# Patient Record
Sex: Male | Born: 1940
Health system: Southern US, Community
[De-identification: ages and names within clinical notes are randomized; demographics above are authoritative.]

## PROBLEM LIST (undated history)

## (undated) ENCOUNTER — Emergency Department (HOSPITAL_COMMUNITY): Discharge status: Absent without leave | Payer: PPO

## (undated) DIAGNOSIS — R911 Solitary pulmonary nodule: Secondary | ICD-10-CM

## (undated) DIAGNOSIS — R3911 Hesitancy of micturition: Secondary | ICD-10-CM

## (undated) DIAGNOSIS — I10 Essential (primary) hypertension: Secondary | ICD-10-CM

## (undated) DIAGNOSIS — E291 Testicular hypofunction: Secondary | ICD-10-CM

## (undated) DIAGNOSIS — N200 Calculus of kidney: Secondary | ICD-10-CM

## (undated) DIAGNOSIS — Z8719 Personal history of other diseases of the digestive system: Secondary | ICD-10-CM

## (undated) DIAGNOSIS — M545 Low back pain, unspecified: Secondary | ICD-10-CM

## (undated) DIAGNOSIS — G8929 Other chronic pain: Secondary | ICD-10-CM

## (undated) DIAGNOSIS — R519 Headache, unspecified: Secondary | ICD-10-CM

## (undated) DIAGNOSIS — Z8711 Personal history of peptic ulcer disease: Secondary | ICD-10-CM

## (undated) DIAGNOSIS — E559 Vitamin D deficiency, unspecified: Secondary | ICD-10-CM

## (undated) DIAGNOSIS — K760 Fatty (change of) liver, not elsewhere classified: Secondary | ICD-10-CM

## (undated) DIAGNOSIS — R51 Headache: Secondary | ICD-10-CM

## (undated) DIAGNOSIS — K219 Gastro-esophageal reflux disease without esophagitis: Secondary | ICD-10-CM

## (undated) DIAGNOSIS — E119 Type 2 diabetes mellitus without complications: Secondary | ICD-10-CM

## (undated) DIAGNOSIS — M1990 Unspecified osteoarthritis, unspecified site: Secondary | ICD-10-CM

## (undated) DIAGNOSIS — G4733 Obstructive sleep apnea (adult) (pediatric): Secondary | ICD-10-CM

## (undated) DIAGNOSIS — C44621 Squamous cell carcinoma of skin of unspecified upper limb, including shoulder: Secondary | ICD-10-CM

## (undated) DIAGNOSIS — E785 Hyperlipidemia, unspecified: Secondary | ICD-10-CM

## (undated) DIAGNOSIS — Z9289 Personal history of other medical treatment: Secondary | ICD-10-CM

## (undated) DIAGNOSIS — Z9989 Dependence on other enabling machines and devices: Secondary | ICD-10-CM

## (undated) DIAGNOSIS — F32A Depression, unspecified: Secondary | ICD-10-CM

## (undated) DIAGNOSIS — F419 Anxiety disorder, unspecified: Secondary | ICD-10-CM

## (undated) DIAGNOSIS — F329 Major depressive disorder, single episode, unspecified: Secondary | ICD-10-CM

## (undated) HISTORY — DX: Solitary pulmonary nodule: R91.1

## (undated) HISTORY — DX: Major depressive disorder, single episode, unspecified: F32.9

## (undated) HISTORY — PX: CARDIAC CATHETERIZATION: SHX172

## (undated) HISTORY — DX: Unspecified osteoarthritis, unspecified site: M19.90

## (undated) HISTORY — DX: Personal history of peptic ulcer disease: Z87.11

## (undated) HISTORY — DX: Depression, unspecified: F32.A

## (undated) HISTORY — DX: Hyperlipidemia, unspecified: E78.5

## (undated) HISTORY — DX: Essential (primary) hypertension: I10

## (undated) HISTORY — DX: Personal history of other diseases of the digestive system: Z87.19

## (undated) HISTORY — DX: Testicular hypofunction: E29.1

## (undated) HISTORY — DX: Vitamin D deficiency, unspecified: E55.9

---

## 2009-08-05 HISTORY — PX: CATARACT EXTRACTION W/ INTRAOCULAR LENS IMPLANT: SHX1309

## 2009-08-06 ENCOUNTER — Ambulatory Visit: Payer: Self-pay | Admitting: Family

## 2009-08-06 DIAGNOSIS — I10 Essential (primary) hypertension: Secondary | ICD-10-CM

## 2009-08-06 DIAGNOSIS — G4733 Obstructive sleep apnea (adult) (pediatric): Secondary | ICD-10-CM | POA: Insufficient documentation

## 2009-08-06 DIAGNOSIS — Z8711 Personal history of peptic ulcer disease: Secondary | ICD-10-CM

## 2009-08-06 DIAGNOSIS — E669 Obesity, unspecified: Secondary | ICD-10-CM

## 2009-08-06 DIAGNOSIS — F341 Dysthymic disorder: Secondary | ICD-10-CM

## 2009-08-06 DIAGNOSIS — M1710 Unilateral primary osteoarthritis, unspecified knee: Secondary | ICD-10-CM

## 2009-08-06 DIAGNOSIS — Z860100 Personal history of colon polyps, unspecified: Secondary | ICD-10-CM | POA: Insufficient documentation

## 2009-08-06 DIAGNOSIS — E119 Type 2 diabetes mellitus without complications: Secondary | ICD-10-CM

## 2009-08-06 DIAGNOSIS — E785 Hyperlipidemia, unspecified: Secondary | ICD-10-CM | POA: Insufficient documentation

## 2009-08-06 DIAGNOSIS — IMO0002 Reserved for concepts with insufficient information to code with codable children: Secondary | ICD-10-CM | POA: Insufficient documentation

## 2009-08-06 LAB — CONVERTED CEMR LAB
BUN: 23 mg/dL (ref 6–23)
CO2: 24 meq/L (ref 19–32)
Calcium: 9.6 mg/dL (ref 8.4–10.5)
Chloride: 104 meq/L (ref 96–112)
Creatinine, Ser: 0.87 mg/dL (ref 0.40–1.50)
Glucose, Bld: 77 mg/dL (ref 70–99)
Potassium: 4.4 meq/L (ref 3.5–5.3)
Sodium: 142 meq/L (ref 135–145)

## 2009-08-06 LAB — HM COLONOSCOPY

## 2009-08-09 ENCOUNTER — Telehealth: Payer: Self-pay | Admitting: Family

## 2009-08-13 ENCOUNTER — Encounter: Payer: Self-pay | Admitting: Family

## 2009-08-15 ENCOUNTER — Encounter: Payer: Self-pay | Admitting: Family

## 2009-08-16 ENCOUNTER — Encounter: Payer: Self-pay | Admitting: Family

## 2009-08-16 DIAGNOSIS — K760 Fatty (change of) liver, not elsewhere classified: Secondary | ICD-10-CM

## 2009-08-16 DIAGNOSIS — J449 Chronic obstructive pulmonary disease, unspecified: Secondary | ICD-10-CM

## 2009-08-16 DIAGNOSIS — M545 Low back pain: Secondary | ICD-10-CM

## 2009-08-16 DIAGNOSIS — K219 Gastro-esophageal reflux disease without esophagitis: Secondary | ICD-10-CM

## 2009-08-16 DIAGNOSIS — R809 Proteinuria, unspecified: Secondary | ICD-10-CM

## 2009-09-06 ENCOUNTER — Ambulatory Visit: Payer: Self-pay | Admitting: Family

## 2009-09-06 LAB — CONVERTED CEMR LAB
ALT: 22 units/L (ref 0–53)
Alkaline Phosphatase: 79 units/L (ref 39–117)
Bilirubin, Direct: 0.2 mg/dL (ref 0.0–0.3)
Calcium: 10.4 mg/dL (ref 8.4–10.5)
Cholesterol: 145 mg/dL (ref 0–200)
Creatinine, Ser: 1.14 mg/dL (ref 0.40–1.50)
Creatinine, Urine: 89.4 mg/dL
HDL: 33 mg/dL — ABNORMAL LOW (ref >39)
LDL Cholesterol: 74 mg/dL (ref 0–99)
Potassium: 4.4 meq/L (ref 3.5–5.3)
Total Bilirubin: 0.8 mg/dL (ref 0.3–1.2)
Total Protein: 7.5 g/dL (ref 6.0–8.3)

## 2009-09-12 ENCOUNTER — Encounter: Payer: Self-pay | Admitting: Family

## 2009-09-17 ENCOUNTER — Telehealth: Payer: Self-pay | Admitting: Family

## 2009-09-19 ENCOUNTER — Encounter: Payer: Self-pay | Admitting: Family

## 2009-09-23 ENCOUNTER — Encounter: Payer: Self-pay | Admitting: Family

## 2009-09-25 ENCOUNTER — Encounter: Payer: Self-pay | Admitting: Family

## 2009-10-14 ENCOUNTER — Telehealth: Payer: Self-pay | Admitting: Family

## 2009-10-17 ENCOUNTER — Ambulatory Visit: Payer: Self-pay | Admitting: Family

## 2009-11-18 ENCOUNTER — Telehealth: Payer: Self-pay | Admitting: Family

## 2009-12-09 ENCOUNTER — Ambulatory Visit: Payer: Self-pay | Admitting: Family

## 2009-12-09 LAB — CONVERTED CEMR LAB
BUN: 22 mg/dL (ref 6–23)
Calcium: 9.6 mg/dL (ref 8.4–10.5)
Chloride: 102 meq/L (ref 96–112)
Hgb A1c MFr Bld: 5.9 % — ABNORMAL HIGH (ref <5.7)
Sodium: 139 meq/L (ref 135–145)

## 2009-12-10 ENCOUNTER — Telehealth: Payer: Self-pay | Admitting: Family

## 2009-12-23 ENCOUNTER — Ambulatory Visit (HOSPITAL_BASED_OUTPATIENT_CLINIC_OR_DEPARTMENT_OTHER)
Admission: RE | Admit: 2009-12-23 | Discharge: 2009-12-23 | Payer: Self-pay | Source: Home / Self Care | Attending: Internal Medicine | Admitting: Internal Medicine

## 2009-12-23 ENCOUNTER — Ambulatory Visit: Payer: Self-pay | Admitting: Family

## 2009-12-31 ENCOUNTER — Ambulatory Visit: Payer: Self-pay | Admitting: Family

## 2009-12-31 DIAGNOSIS — N529 Male erectile dysfunction, unspecified: Secondary | ICD-10-CM | POA: Insufficient documentation

## 2010-01-13 ENCOUNTER — Telehealth: Payer: Self-pay | Admitting: Family

## 2010-01-20 ENCOUNTER — Telehealth: Payer: Self-pay | Admitting: Family

## 2010-01-28 ENCOUNTER — Encounter: Payer: Self-pay | Admitting: Family

## 2010-02-03 ENCOUNTER — Telehealth: Payer: Self-pay | Admitting: Family

## 2010-02-04 NOTE — Letter (Signed)
   [place] at [facility] [address] [place], [place]  [address]  [place] Phone: [phone]      September 12, 2009   [facility] [address] [place], [place] [address]  RE:  LAB RESULTS  Dear  Mr. Horen,  The following is an interpretation of your most recent lab tests.  Please take note of any instructions provided or changes to medications that have resulted from your lab work.  ELECTROLYTES:  Good - no changes needed  KIDNEY FUNCTION TESTS:  Stable - no changes needed  LIVER FUNCTION TESTS:  Good - no changes needed  LIPID PANEL:  Fair - review at your next visit Triglyceride: 189   Cholesterol: 145   LDL: 74   HDL: 33   Chol/HDL%:  4.4 Ratio  DIABETIC STUDIES:  Excellent - no changes needed Blood Glucose: 80   HgbA1C: 5.9   Microalbumin/Creatinine Ratio: 28.6    Your triglycerides are high.  Please make the following nutritional changes: 1.  Avoid white bread, white pasta and white rice 2.  Avoid high fructose corn syrup 3.  Instead eat brown carbs- Yams, Wheat pasta, whole grained breads, wild rice.  Return in 3 months for a follow up fasting lipid profile    Sincerely Yours,    Lemont Fillers FNP  Appended Document:  Mailed.

## 2010-02-04 NOTE — Assessment & Plan Note (Signed)
Summary: flu shot/dt  Nurse Visit   Allergies: No Known Drug Allergies  Immunizations Administered:  Influenza Vaccine # 1:    Vaccine Type: Fluvax MCR    Site: left deltoid    Mfr: GlaxoSmithKline    Dose: 0.5 ml    Route: IM    Given by: Glendell Docker CMA    Exp. Date: 07/05/2010    Lot #: [id number]    VIS given: 07/30/09 version given October 17, 2009.  Flu Vaccine Consent Questions:    Do you have a history of severe allergic reactions to this vaccine? no    Any prior history of allergic reactions to egg and/or gelatin? no    Do you have a sensitivity to the preservative Thimersol? no    Do you have a past history of Guillan-Barre Syndrome? no    Do you currently have an acute febrile illness? no    Have you ever had a severe reaction to latex? no    Vaccine information given and explained to patient? yes  Orders Added: 1)  Influenza Vaccine MCR [00025] 2)  Administration Flu vaccine - MCR [G0008]

## 2010-02-04 NOTE — Letter (Signed)
Summary: Records Dated 08-16-07 thru 07-10-09/[facility], [place]  Records Dated 08-16-07 thru 07-10-09/[facility]   Imported By: Lanelle Bal 08/26/2009 14:28:20  _____________________________________________________________________  External Attachment:    Type:   Image     Comment:   External Document

## 2010-02-04 NOTE — Miscellaneous (Signed)
Summary: Controlled Substances Contract/[facility]  Controlled Substances Contract/[facility]   Imported By: Sherian Rein 09/18/2009 10:16:35  _____________________________________________________________________  External Attachment:    Type:   Image     Comment:   External Document

## 2010-02-04 NOTE — Assessment & Plan Note (Signed)
Summary: NEW PT EST CARE/DT--Room 5   Vital Signs:  Patient profile:   70 year old male Height:      70 inches Weight:      307.50 pounds BMI:     44.28 Temp:     97.9 degrees F oral Pulse rate:   60 / minute Pulse rhythm:   irregular Resp:     18 per minute BP sitting:   150 / 90  (right arm) Cuff size:   large  Vitals Entered By: Mervin Kung CMA Duncan Dull) (August 06, 2009 10:49 AM) CC: Room 5  New pt to establish primary care.  Is Patient Diabetic? Yes   CC:  Room 5  New pt to establish primary care. [name]  History of Present Illness: Christian Morris is a 70 year old male who recently returned here from [facility].  He is originally from [place], [place]- family is here.  He reports that he would like to become established here as a patient.  He reports that he had a complete physical on 07/10/09.  The patient has multiple medical problems.  1) Arthritis-  notes that he often has knee pain- takes aleve "every now and then."  2) Depression-  Notes that he has a history of depression which is well controlled on paxil.  3) DM2- Reports that he was diagnosed about 7 years ago.  Does not check sugars at home.  Does not have a glucose meter  4) Ulcers- Reports that he was diagnosed at age 46- has been "under control" since  5) HTN- Not always compliant with low sodium diet.  6) Hyperlipidemia- Reports a low cholesterol diet.  7) Colon polyps-  reports thta this was diagnosed on colonoscopy (had colo this year- reports that he is due in 2014.  8) Obesity- reports that his weight has been stable.    9) OSA- uses a CPAP x 10 years-  reports that he needs to establish with a local home care agency.  Reports that his current setting is 17.  Uses a full face mask and humidification.    Preventive Screening-Counseling & Management  Alcohol-Tobacco     Alcohol drinks/day: 0     Smoking Status: never  Caffeine-Diet-Exercise     Caffeine use/day: 2 cups coffee daily     Does Patient  Exercise: yes     Type of exercise: walking     Exercise (avg: min/session): >60     Times/week: 6  Allergies (verified): No Known Drug Allergies  Past History:  Past Medical History: Arthritis Depression Diabetes History of Stomach Ulcer HTN Hypercholesterolemia Blood Transfusion due to bleeding ulcer  Past Surgical History: none  Family History: Mother-- Arthritis, deceased ? 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 Quit smoking in 1984 Alcohol use-no Regular exercise-yes Smoking Status:  never Caffeine use/day:  2 cups coffee daily Does Patient Exercise:  yes  Review of Systems       Constitutional: Denies Fever ENT:  Denies nasal congestion or sore throat. Resp: Denies cough CV:  Denies Chest Pain GI:  Denies nausea or vomitting GU: Denies dysuria Lymphatic: Denies lymphadenopathy Musculoskeletal:  Denies muscle/joint pain Skin:  Denies Rashes Psychiatric: Denies depression Neuro: Denies numbness     Physical Exam  General:  Morbidly obese, pleasant white male in NAD Head:  Normocephalic and atraumatic without obvious abnormalities. No apparent alopecia  or balding. Lungs:  Normal respiratory effort, chest expands symmetrically. Lungs are clear to auscultation, no crackles or wheezes. Heart:  Normal rate and regular rhythm. S1 and S2 normal without gallop, murmur, click, rub or other extra sounds. Extremities:  No edema Neurologic:  Mildly hard of hearing-difficulty hearing soft speech Skin:  Intact without suspicious lesions or rashes Psych:  Oriented X3 and normally interactive.     Impression & Recommendations:  Problem # 1:  HYPERTENSION (ICD-401.9) Assessment Deteriorated Will add HCTZ to his regimen of Diltiazem and Losartan.   His updated medication list for this problem includes:     Diltiazem Hcl Er Beads 180 Mg Xr24h-cap (Diltiazem hcl er beads) .[name]... Take 1 tablet by mouth once a day.    Losartan Potassium 100 Mg Tabs (Losartan potassium) .[name]... Take 1 tablet by mouth once a day    Hydrochlorothiazide 25 Mg Tabs (Hydrochlorothiazide) ..... One tablet by mouth daily  Orders: T-Basic Metabolic Panel [phone])  BP today: 150/90  Problem # 2:  HYPERLIPIDEMIA (ICD-272.4) Assessment: Comment Only On lipitor- will try to obtain lab tests from his former provider- reports labs done 1 month ago. His updated medication list for this problem includes:    Lipitor 40 Mg Tabs (Atorvastatin calcium) .[name]... Take 1 tablet by mouth once a day.  Problem # 3:  DIABETES MELLITUS, TYPE II (ICD-250.00) Assessment: Comment Only  Does not have meter at home.  Meter was provided to patient. Pt was instructed today on proper use of meter and glycemic goals.  Obtain old labs- will refer patient to podiatry and opthalmology. His updated medication list for this problem includes:    Metformin Hcl 1000 Mg Tabs (Metformin hcl) .[name]... Take 1 tablet by mouth two times a day    Glimepiride 4 Mg Tabs (Glimepiride) .[name]... Take 1 tablet by mouth once a day    Losartan Potassium 100 Mg Tabs (Losartan potassium) .[name]... Take 1 tablet by mouth once a day    Ecotrin Low Strength 81 Mg Tbec (Aspirin) .[name]... Take 1 tablet by mouth once a day  Orders: Ophthalmology Referral (Ophthalmology) Podiatry Referral (Podiatry)  Problem # 4:  SLEEP APNEA, OBSTRUCTIVE (ICD-327.23) Assessment: Comment Only  Wears CPAP,  request local homecare referral for supplies.  Orders: Misc. Referral (Misc. Ref)  Complete Medication List: 1)  Diazepam 10 Mg Tabs (Diazepam) .... Take 1 tablet by mouth once a day 2)  Metformin Hcl 1000 Mg Tabs (Metformin hcl) .... Take 1 tablet by mouth two times a day 3)  Hydrocodone-acetaminophen 7.5-500 Mg Tabs (Hydrocodone-acetaminophen) .... Take 1 tablet by mouth three times a day 4)   Diltiazem Hcl Er Beads 180 Mg Xr24h-cap (Diltiazem hcl er beads) .... Take 1 tablet by mouth once a day. 5)  Glimepiride 4 Mg Tabs (Glimepiride) .... Take 1 tablet by mouth once a day 6)  Omeprazole 20 Mg Cpdr (Omeprazole) .... Take 1 capsule by mouth once a day 7)  Paroxetine Hcl 30 Mg Tabs (Paroxetine hcl) .... Take 1 tablet by mouth once a day 8)  Lipitor 40 Mg Tabs (Atorvastatin calcium) .... Take 1 tablet by mouth once a day. 9)  Losartan Potassium 100 Mg Tabs (Losartan potassium) .... Take 1 tablet by mouth once a day 10)  Ecotrin Low Strength 81 Mg Tbec (Aspirin) .... Take 1 tablet by mouth once a day 11)  Hydrochlorothiazide 25 Mg Tabs (Hydrochlorothiazide) .... One tablet by mouth daily 12)  Accu-chek Aviva Strp (Glucose blood) .... Check blood sugar once a  day.  call if you sugar is over 250 or under 80. 13)  Lancets Misc (Lancets) .... Use as directed  Patient Instructions: 1)  Work hard on diet, exercise and weight loss.   2)  We will contact you about your referral to the eye doctor, and foot doctor. 3)  We will arrange your home care agency-  4)  Please check your blood sugar at least once a day and record in a log- bring this log with you to your next appointment. 5)  Start HCTZ- new medication for your blood pressure.   6)  Follow up in 1 month. 7)  Welcome to [facility], it was a pleasure to meet you! Prescriptions: LANCETS  MISC (LANCETS) use as directed  #1 box x 2   Entered and Authorized by:   Lemont Fillers FNP   Signed by:   Lemont Fillers FNP on 08/06/2009   Method used:   Electronically to        [facility] [phone]* (retail)       [address]       [place], [place]  [address]       Ph: [phone]       Fax: [phone]   RxID:   [id number] ACCU-CHEK AVIVA  STRP (GLUCOSE BLOOD) check blood sugar once a day.  Call if you sugar is over 250 or under 80.  #1 box x 6   Entered and Authorized by:   Lemont Fillers FNP   Signed by:    Lemont Fillers FNP on 08/06/2009   Method used:   Electronically to        [facility] [phone]* (retail)       [address]       [place], [place]  [address]       Ph: [phone]       Fax: [phone]   RxID:   [id number] HYDROCHLOROTHIAZIDE 25 MG TABS (HYDROCHLOROTHIAZIDE) one tablet by mouth daily  #30 x 2   Entered and Authorized by:   Lemont Fillers FNP   Signed by:   Lemont Fillers FNP on 08/06/2009   Method used:   Electronically to        [facility] [phone]* (retail)       [address]       [place], [place]  [address]       Ph: [phone]       Fax: [phone]   RxID:   [id number]   Current Allergies (reviewed today): No known allergies    Preventive Care Screening  Colonoscopy:    Date:  07/17/2009    Next Due:  07/2012    Results:  Adenomatous Polyp   Last Pneumovax:    Date:  02/07/2007    Results:  historical   Last Tetanus Booster:    Date:  10/06/2006    Results:  Historical      Colonoscopy--? date, due again 2014 per pt. Nicki Guadalajara Fergerson CMA Duncan Dull)  August 06, 2009 11:08 AM  Had Zostavax 01/18/08 Pneumovax 12/1998 and 02/2007    Immunization History:  Zostavax History:    Zostavax:  historical (01/18/2008)

## 2010-02-04 NOTE — Letter (Signed)
Summary: Controlled Substances Contract  [place] [facility]  [address]   [place], [place] [address]   Phone: [phone]  Fax: [phone]    [facility] Controlled Substances Contract         Patient Name: Christian Morris Patient DOB: 15-May-1940        Patient MRN:  [id number]        Physician's Name: _________________________________________   Patients must complete this contract before doctors at the [facility] office will be willing to prescribe controlled substances. I understand that: ___1)  I am responsible for my controlled substance medications.  If my prescription is lost, misplaced or stolen, or if I take more than prescribed, my doctor will not write me a new prescription. ___2)  I will not request or accept controlled substances or controlled substance prescriptions from any other doctor or clinic while I am receiving controlled substance treatment at [facility].  The ONLY exception is if controlled substances are prescribed for the treatment of an acute condition that is NOT the diagnosis for which I am receiving treatment at [facility].   I will call my physician at [facility] if I receive controlled substance or controlled substance prescriptions from anywhere else. ___3)  Controlled substance refills will be made ONLY during regular office hours. ___4)  Refills will not be made if I run out early.  Refills will not be made during work-in or urgent care visits.  Refills will NOT be made for "emergencies", such as on a Friday afternoon or by on call service at night or weekends.  I understand that I am required to call at least 2 business days prior to expiration date for controlled substance and/or needing controlled substance refills.   ___5)  I will not use illicit (illegal) drugs.  ___6)  I agree to take urine or blood drug tests when requested for routine screening. ___7)  I agree to use only ONE pharmacy for  filling ALL my controlled substance prescriptions.             Name and Location of Pharmacy:                                                                                                                  ___8)  I understand that my doctor may review my use of controlled substances using the [facility] Controlled Substance Reporting System. ___9)  If I behave in an abusive way towards [facility] staff, my controlled substance prescriptions may be stopped, and I may be dismissed from this practice. ___10)  I understand that if I break any of the above terms of this contract, my pain prescription and/or treatment may be stopped immediately.  If I get controlled substances from someone else or use illegal drugs, I may be reported to all my doctors, medical facilities and appropriate authorities.  I have been fully informed by [facility] physicians and the staff regarding psychological dependence (  addiction) to controlled substances.  I understand that I should stop my medication ONLY under medical supervision or I may have withdrawal symptoms.  ***I have read this contract and it has been explained to me by [facility] physicians and/or their staff, and I fully understand the consequences of violating any of the terms of this contract.    Patient Signature _________________________________________ Date September 06, 2009   [facility] Staff Signature ____________________________________ Date September 06, 2009

## 2010-02-04 NOTE — Miscellaneous (Signed)
Summary: diabetic eye exam  Clinical Lists Changes  Observations: Added new observation of DMEYEEXAMNXT: 09/2010 (09/25/2009 8:19) Added new observation of DMEYEEXMRES: normal--slight proptosis left eye (09/19/2009 8:21) Added new observation of EYE EXAM BY: [facility] [phone] (09/19/2009 8:21) Added new observation of DIAB EYE EX: normal--slight proptosis left eye (09/19/2009 8:21)        Diabetes Management Exam:    Eye Exam:       Eye Exam done elsewhere          Date: 09/19/2009          Results: normal--slight proptosis left eye          Done by: [facility] [phone]

## 2010-02-04 NOTE — Progress Notes (Signed)
Summary: Diazepam refill  Phone Note Refill Request Call back at Home Phone [phone] Message from:  Patient on October 14, 2009 8:53 AM  Refills Requested: Medication #1:  DIAZEPAM 10 MG TABS Take 1 tablet by mouth once a day   Dosage confirmed as above?Dosage Confirmed   Brand Name Necessary? No   Supply Requested: 1 month  Method Requested: Electronic Next Appointment Scheduled: 12.5.11 Initial call taken by: Lannette Donath,  October 14, 2009 8:54 AM  Follow-up for Phone Call        Rx printed and faxed to [facility] [phone]. Pt notified. Nicki Guadalajara Fergerson CMA Duncan Dull)  October 14, 2009 3:13 PM     Prescriptions: DIAZEPAM 10 MG TABS (DIAZEPAM) Take 1 tablet by mouth once a day  #30 x 0   Entered and Authorized by:   Lemont Fillers FNP   Signed by:   Lemont Fillers FNP on 10/14/2009   Method used:   Print then Give to Patient   RxID:   [id number]

## 2010-02-04 NOTE — Progress Notes (Signed)
Summary: Diazepam refill  Phone Note Refill Request Call back at [phone] Message from:  Patient on November 18, 2009 10:36 AM  Refills Requested: Medication #1:  DIAZEPAM 10 MG TABS Take 1 tablet by mouth once a day   Dosage confirmed as above?Dosage Confirmed   Brand Name Necessary? No   Supply Requested: 1 month   Last Refilled: 10/14/2009  Method Requested: Electronic Next Appointment Scheduled: 12/09/09 Initial call taken by: Lannette Donath,  November 18, 2009 10:36 AM    Prescriptions: DIAZEPAM 10 MG TABS (DIAZEPAM) Take 1 tablet by mouth once a day  #30 x 0   Entered and Authorized by:   Lemont Fillers FNP   Signed by:   Lemont Fillers FNP on 11/18/2009   Method used:   Printed then faxed to ...       [facility] [address] [phone]* (retail)       [address]       [place], [place]  [address]       Ph: [phone]       Fax: [phone]   RxID:   [id number]

## 2010-02-04 NOTE — Assessment & Plan Note (Signed)
Summary: 1 month fu/dt--rm 4   Vital Signs:  Patient profile:   70 year old male Height:      70 inches Weight:      301.75 pounds BMI:     43.45 Temp:     98.0 degrees F oral Pulse rate:   72 / minute Pulse rhythm:   regular Resp:     16 per minute BP sitting:   130 / 78  (right arm) Cuff size:   large  Vitals Entered By: Mervin Kung CMA Duncan Dull) (September 06, 2009 10:33 AM)  CC: Rm 4  1 month follow up. Is Patient Diabetic? Yes Comments Needs refill on Glimepiride. Nicki Guadalajara Fergerson CMA (AAMA)  September 06, 2009 10:38 AM    CC:  Rm 4  1 month follow up.[name]  History of Present Illness: Christian Morris is a 70 year old male who presents today to follow up.  HTN- Added HCTZ last visit.  BP improved, pt has been walking and has lost 6 pounds.    DM2-  has been having trouble with his blood blood glucose meter.  Pt reports that he did see podiatrist but cancelled the eye exam due to finances.  Allergies (verified): No Known Drug Allergies  Past History:  Past Medical History: Last updated: 08/06/2009 Arthritis Depression Diabetes History of Stomach Ulcer HTN Hypercholesterolemia Blood Transfusion due to bleeding ulcer  Review of Systems       see HPI  Physical Exam  General:  Morbidly obese, pleasant white male in NAD Lungs:  Normal respiratory effort, chest expands symmetrically. Lungs are clear to auscultation, no crackles or wheezes. Heart:  Normal rate and regular rhythm. S1 and S2 normal without gallop, murmur, click, rub or other extra sounds.  Diabetes Management Exam:    Foot Exam (with socks and/or shoes not present):       Sensory-Monofilament:          Left foot: diminished          Right foot: diminished       Sensory-other: diminishes sensation to monofilament, however feet are very calloused.       Inspection:          Left foot: abnormal             Comments: dry thickened skin noted soles          Right foot: abnormal             Comments:  dry thickened skin on sole   Impression & Recommendations:  Problem # 1:  DIABETES MELLITUS, TYPE II (ICD-250.00) Assessment Comment Only Reviewed old records- last A1C was 7/10, will repeat today and include a lipid panel since patient is fasting today.  I encouraged him on his weight loss and walking. His updated medication list for this problem includes:    Metformin Hcl 1000 Mg Tabs (Metformin hcl) .[name]... Take 1 tablet by mouth two times a day    Glimepiride 4 Mg Tabs (Glimepiride) .[name]... Take 1 tablet by mouth once a day    Losartan Potassium 100 Mg Tabs (Losartan potassium) .[name]... Take 1 tablet by mouth once a day    Ecotrin Low Strength 81 Mg Tbec (Aspirin) .[name]... Take 1 tablet by mouth once a day  Orders: T-Hgb A1C ([address]-60[address]) UA Microalbumin-[facility] (09811) Prescription Created Electronically [phone])  Problem # 2:  HYPERTENSION (ICD-401.9) Assessment: Improved BP is improved, continue current meds. His updated medication list for this problem includes:  Diltiazem Hcl Er Beads 180 Mg Xr24h-cap (Diltiazem hcl er beads) .[name]... Take 1 tablet by mouth once a day.    Losartan Potassium 100 Mg Tabs (Losartan potassium) .[name]... Take 1 tablet by mouth once a day    Hydrochlorothiazide 25 Mg Tabs (Hydrochlorothiazide) ..... One tablet by mouth daily  Orders: TLB-BMP (Basic Metabolic Panel-BMET) (80048-METABOL)  BP today: 130/78 Prior BP: 150/90 (08/06/2009)  Labs Reviewed: K+: 4.4 (08/06/2009) Creat: : 0.87 (08/06/2009)     Complete Medication List: 1)  Diazepam 10 Mg Tabs (Diazepam) .... Take 1 tablet by mouth once a day 2)  Metformin Hcl 1000 Mg Tabs (Metformin hcl) .... Take 1 tablet by mouth two times a day 3)  Hydrocodone-acetaminophen 7.5-500 Mg Tabs (Hydrocodone-acetaminophen) .... Take 1 tablet by mouth three times a day 4)  Diltiazem Hcl Er Beads 180 Mg Xr24h-cap (Diltiazem hcl er beads) .... Take 1 tablet by mouth once a day. 5)  Glimepiride 4 Mg Tabs (Glimepiride) ....  Take 1 tablet by mouth once a day 6)  Omeprazole 20 Mg Cpdr (Omeprazole) .... Take 1 capsule by mouth once a day 7)  Paroxetine Hcl 30 Mg Tabs (Paroxetine hcl) .... Take 1 tablet by mouth once a day 8)  Lipitor 40 Mg Tabs (Atorvastatin calcium) .... Take 1 tablet by mouth once a day. 9)  Losartan Potassium 100 Mg Tabs (Losartan potassium) .... Take 1 tablet by mouth once a day 10)  Ecotrin Low Strength 81 Mg Tbec (Aspirin) .... Take 1 tablet by mouth once a day 11)  Hydrochlorothiazide 25 Mg Tabs (Hydrochlorothiazide) .... One tablet by mouth daily 12)  Accu-chek Aviva Strp (Glucose blood) .... Check blood sugar once a day.  call if you sugar is over 250 or under 80. 13)  Lancets Misc (Lancets) .... Use as directed  Other Orders: TLB-Lipid Panel (80061-LIPID) TLB-Hepatic/Liver Function Pnl (80076-HEPATIC)  Patient Instructions: 1)  Complete your lab work downstairs today on the first floor. 2)  Please follow up in 3 months. 3)  Reschedule your eye exam when you are able.  4)  Have a nice Fall! Prescriptions: GLIMEPIRIDE 4 MG TABS (GLIMEPIRIDE) Take 1 tablet by mouth once a day  #30 x 0   Entered and Authorized by:   Lemont Fillers FNP   Signed by:   Lemont Fillers FNP on 09/06/2009   Method used:   Print then Give to Patient   RxID:   [id number] HYDROCODONE-ACETAMINOPHEN 7.5-500 MG TABS (HYDROCODONE-ACETAMINOPHEN) Take 1 tablet by mouth three times a day  #90 x 0   Entered and Authorized by:   Lemont Fillers FNP   Signed by:   Lemont Fillers FNP on 09/06/2009   Method used:   Print then Give to Patient   RxID:   [id number] GLIMEPIRIDE 4 MG TABS (GLIMEPIRIDE) Take 1 tablet by mouth once a day  #30 x 0   Entered and Authorized by:   Lemont Fillers FNP   Signed by:   Lemont Fillers FNP on 09/06/2009   Method used:   Electronically to        [facility] [phone]* (retail)       [address]       [place], [place]  [address]        Ph: [phone]       Fax: [phone]   RxID:   [id number]  Called and left message on voicemail to add 3 refills to pt's rx. per [facility] request. Nicki Guadalajara  Fergerson CMA Duncan Dull)  September 06, 2009 3:07 PM  Current Allergies (reviewed today): No known allergies

## 2010-02-04 NOTE — Progress Notes (Signed)
  Phone Note Outgoing Call      

## 2010-02-04 NOTE — Miscellaneous (Signed)
  Clinical Lists Changes  Problems: Added new problem of CATARACT, LEFT EYE (ICD-366.9)

## 2010-02-04 NOTE — Miscellaneous (Signed)
Summary: immunizations  Clinical Lists Changes  Observations: Added new observation of FLU VAX: Historical (10/25/2008 17:03) Added new observation of ZOSTAVAX: Zostavax (07/17/2008 17:04)      Immunization History:  Influenza Immunization History:    Influenza:  historical (10/25/2008)  Zostavax History:    Zostavax # 1:  zostavax (07/17/2008)

## 2010-02-04 NOTE — Consult Note (Signed)
Summary: [facility]  [facility]   Imported By: Lanelle Bal 10/15/2009 09:11:57  _____________________________________________________________________  External Attachment:    Type:   Image     Comment:   External Document

## 2010-02-04 NOTE — Letter (Signed)
Summary: [facility]  [facility]   Imported By: Lanelle Bal 10/04/2009 11:47:49  _____________________________________________________________________  External Attachment:    Type:   Image     Comment:   External Document

## 2010-02-04 NOTE — Assessment & Plan Note (Signed)
Summary: 3 month follow up/mhf--Rm 4   Vital Signs:  Patient profile:   70 year old male Height:      70 inches Weight:      300.50 pounds BMI:     43.27 Temp:     97.9 degrees F o1 Pulse rate:   56 / minute Pulse rhythm:   regular Resp:     18 per minute BP sitting:   130 / 86  (right arm) Cuff size:   large  Vitals Entered By: Mervin Kung CMA Duncan Dull) (December 09, 2009 8:51 AM) CC: Pt here for 3 month f/u.  Is Patient Diabetic? Yes Comments Pt needs refill on Hydrocodone and HCTZ if he is to continue it. Pt agrees all other med doses and directions are correct. Nicki Guadalajara Fergerson CMA Duncan Dull)  December 09, 2009 8:59 AM    Primary Care Provider:  Lemont Fillers FNP  CC:  Pt here for 3 month f/u. [name]  History of Present Illness: Mr. Scoggins is a 70 year old male who presents today for routine follow up.  1) Obesity-  Reports that he is now waling 5 miles a day 5-6 days a walk.     2) DM-  Has not been checking sugars at home.  Denies nocturia, or polydypsia.  3) Depression/Anxiety-  Reports that this is well controlled.  Sleeping well.  Takes diazepam once a day  4) HTN- Denies LE edema, headache or chest pain.  Ran out of the HCTZ  5) Back pain-  still requiring the hydrocodone regulary.  "I can't walk with it."   Allergies (verified): No Known Drug Allergies  Past History:  Past Medical History: Last updated: 08/06/2009 Arthritis Depression Diabetes History of Stomach Ulcer HTN Hypercholesterolemia Blood Transfusion due to bleeding ulcer  Past Surgical History: August 2011 L cataract surgery (Dr. Dione Booze)  Review of Systems       see HPI recently had some pain in the left ear  Physical Exam  General:  Morbidly obese, pleasant white male in NAD Ears:  External ear exam shows no significant lesions or deformities.  Otoscopic examination reveals clear canals, tympanic membranes are intact bilaterally without bulging, retraction, inflammation or  discharge. Hearing is grossly normal bilaterally. Neck:  No deformities, masses, or tenderness noted. Lungs:  Normal respiratory effort, chest expands symmetrically. Lungs are clear to auscultation, no crackles or wheezes. Heart:  Normal rate and regular rhythm. S1 and S2 normal without gallop, murmur, click, rub or other extra sounds. Extremities:  No peripheral edema Psych:  Cognition and judgment appear intact. Alert and cooperative with normal attention span and concentration. No apparent delusions, illusions, hallucinations   Impression & Recommendations:  Problem # 1:  LOW BACK PAIN, CHRONIC (ICD-724.2) Assessment Unchanged Continue hydrocodone and weight loss efforts. His updated medication list for this problem includes:    Hydrocodone-acetaminophen 7.5-500 Mg Tabs (Hydrocodone-acetaminophen) .[name]... Take 1 tablet by mouth three times a day    Ecotrin Low Strength 81 Mg Tbec (Aspirin) .[name]... Take 1 tablet by mouth once a day  Problem # 2:  DIABETES MELLITUS, TYPE II (ICD-250.00) Assessment: Comment Only A1C last visit was 5.9, had eye exam, repeat A1C His updated medication list for this problem includes:    Metformin Hcl 1000 Mg Tabs (Metformin hcl) .[name]... Take 1 tablet by mouth two times a day    Glimepiride 4 Mg Tabs (Glimepiride) .[name]... Take 1 tablet by mouth once a day    Losartan Potassium 100 Mg  Tabs (Losartan potassium) .[name]... Take 1 tablet by mouth once a day    Ecotrin Low Strength 81 Mg Tbec (Aspirin) .[name]... Take 1 tablet by mouth once a day  Orders: TLB-BMP (Basic Metabolic Panel-BMET) (80048-METABOL) T-Hgb A1C (11914-78295)  Labs Reviewed: Creat: 1.14 (09/06/2009)     Last Eye Exam: normal--slight proptosis left eye (09/19/2009) Reviewed HgBA1c results: 5.9 (09/06/2009)  Problem # 3:  HYPERTENSION (ICD-401.9) Assessment: Comment Only Has not taken HCTZ in 2 months, ran out.  DBP not quite at goal.  Instructed patient to resume. His updated medication list for this  problem includes:    Diltiazem Hcl Er Beads 180 Mg Xr24h-cap (Diltiazem hcl er beads) .[name]... Take 1 tablet by mouth once a day.    Losartan Potassium 100 Mg Tabs (Losartan potassium) .[name]... Take 1 tablet by mouth once a day    Hydrochlorothiazide 25 Mg Tabs (Hydrochlorothiazide) ..... One tablet by mouth daily  Orders: TLB-BMP (Basic Metabolic Panel-BMET) (80048-METABOL)  BP today: 130/86 Prior BP: 130/78 (09/06/2009)  Labs Reviewed: K+: 4.4 (09/06/2009) Creat: : 1.14 (09/06/2009)   Chol: 145 (09/06/2009)   HDL: 33 (09/06/2009)   LDL: 74 (09/06/2009)   TG: 189 (09/06/2009)  Problem # 4:  MICROALBUMINURIA (ICD-791.0) Assessment: Comment Only continue ARB for renal protection.  Problem # 5:  ANXIETY DEPRESSION (ICD-300.4) Assessment: Improved Reports that this is well controlled.  Uses valium once daily  Complete Medication List: 1)  Diazepam 10 Mg Tabs (Diazepam) .... Take 1 tablet by mouth once a day 2)  Metformin Hcl 1000 Mg Tabs (Metformin hcl) .... Take 1 tablet by mouth two times a day 3)  Hydrocodone-acetaminophen 7.5-500 Mg Tabs (Hydrocodone-acetaminophen) .... Take 1 tablet by mouth three times a day 4)  Diltiazem Hcl Er Beads 180 Mg Xr24h-cap (Diltiazem hcl er beads) .... Take 1 tablet by mouth once a day. 5)  Glimepiride 4 Mg Tabs (Glimepiride) .... Take 1 tablet by mouth once a day 6)  Omeprazole 20 Mg Cpdr (Omeprazole) .... Take 1 capsule by mouth once a day 7)  Paroxetine Hcl 30 Mg Tabs (Paroxetine hcl) .... Take 1 tablet by mouth once a day 8)  Lipitor 40 Mg Tabs (Atorvastatin calcium) .... Take 1 tablet by mouth once a day. 9)  Losartan Potassium 100 Mg Tabs (Losartan potassium) .... Take 1 tablet by mouth once a day 10)  Ecotrin Low Strength 81 Mg Tbec (Aspirin) .... Take 1 tablet by mouth once a day 11)  Hydrochlorothiazide 25 Mg Tabs (Hydrochlorothiazide) .... One tablet by mouth daily 12)  Accu-chek Aviva Strp (Glucose blood) .... Check blood sugar once a day.  call  if you sugar is over 250 or under 80. 13)  Lancets Misc (Lancets) .... Use as directed  Patient Instructions: 1)  Please complete your lab work downstairs today. 2)  Follow up in 3 months. 3)  Have a nice Christmas! Prescriptions: HYDROCHLOROTHIAZIDE 25 MG TABS (HYDROCHLOROTHIAZIDE) one tablet by mouth daily  #30 x 2   Entered and Authorized by:   Lemont Fillers FNP   Signed by:   Lemont Fillers FNP on 12/09/2009   Method used:   Electronically to        [facility] [phone]* (retail)       [address]       [place], [place]  [address]       Ph: [phone]       Fax: [phone]   RxID:   [id number] HYDROCODONE-ACETAMINOPHEN 7.5-500 MG TABS (HYDROCODONE-ACETAMINOPHEN)  Take 1 tablet by mouth three times a day  #90 x 0   Entered and Authorized by:   Lemont Fillers FNP   Signed by:   Lemont Fillers FNP on 12/09/2009   Method used:   Print then Give to Patient   RxID:   [id number] DIAZEPAM 10 MG TABS (DIAZEPAM) Take 1 tablet by mouth once a day  #30 x 0   Entered and Authorized by:   Lemont Fillers FNP   Signed by:   Lemont Fillers FNP on 12/09/2009   Method used:   Print then Give to Patient   RxID:   [id number]    Orders Added: 1)  TLB-BMP (Basic Metabolic Panel-BMET) [80048-METABOL] 2)  T-Hgb A1C [83036-23375] 3)  Est. Patient Level III [[address]]    Current Allergies (reviewed today): No known allergies

## 2010-02-04 NOTE — Progress Notes (Signed)
Summary: refill--Glimepiride  Phone Note Refill Request Message from:  Patient on August 09, 2009 10:00 AM  Refills Requested: Medication #1:  GLIMEPIRIDE 4 MG TABS Take 1 tablet by mouth once a day   Dosage confirmed as above?Dosage Confirmed   Supply Requested: 1 month Next Appointment Scheduled: 09/06/09  [name], NP Initial call taken by: Mervin Kung CMA (AAMA),  August 09, 2009 10:00 AM    Prescriptions: GLIMEPIRIDE 4 MG TABS (GLIMEPIRIDE) Take 1 tablet by mouth once a day  #30 x 0   Entered by:   Mervin Kung CMA (AAMA)   Authorized by:   Lemont Fillers FNP   Signed by:   Mervin Kung CMA (AAMA) on 08/09/2009   Method used:   Electronically to        [facility] [phone]* (retail)       [address]       [place], [place]  [address]       Ph: [phone]       Fax: [phone]   RxID:   [id number]

## 2010-02-04 NOTE — Progress Notes (Signed)
Summary: lab result  Phone Note Outgoing Call   Summary of Call: Please call patient and let him know that his diabetes is very well controlled.  I would like him to cut back his metformin to 500 mg twice daily (he can cut pills in half).  Follow-up for Phone Call        Pt notified. Nicki Guadalajara Fergerson CMA Duncan Dull)  December 10, 2009 10:42 AM     New/Updated Medications: METFORMIN HCL 1000 MG TABS (METFORMIN HCL) Take 1/2  tablet by mouth two times a day

## 2010-02-04 NOTE — Miscellaneous (Signed)
  Clinical Lists Changes  Problems: Added new problem of CHRONIC OBSTRUCTIVE PULMONARY DISEASE (ICD-496) Added new problem of GERD (ICD-530.81) Added new problem of LOW BACK PAIN, CHRONIC (ICD-724.2) - history of spinal stenosis Added new problem of MICROALBUMINURIA (ICD-791.0) Added new problem of FATTY LIVER DISEASE (ICD-571.8)

## 2010-02-06 NOTE — Progress Notes (Signed)
Summary: Lortab Refill  Phone Note Call from Patient Call back at Home Phone [phone] Call back at [phone]   Caller: Patient Summary of Call: patient called requesting a rx refill for Lortab for a fracture in his back.  He was informed [name] was out of the office this week on vacation, and approval would need to come from Dr Artist Pais. He was informed a call would be returned to him regarding status Initial call taken by: Glendell Docker CMA,  January 13, 2010 8:43 AM  Follow-up for Phone Call        I do not see any prev notes documenting back pain or prev fracture I suggest OV before refill Follow-up by: D. Thomos Lemons DO,  January 13, 2010 1:42 PM  Additional Follow-up for Phone Call Additional follow up Details #1::        Pt states he will try to wait until [name] returns. If he is unable to wait he states he will make an appt with Dr Artist Pais. Nicki Guadalajara Fergerson CMA Duncan Dull)  January 13, 2010 4:52 PM

## 2010-02-06 NOTE — Progress Notes (Signed)
Summary: refills--diazepam, hydrocodone  Phone Note Refill Request Message from:  Patient on January 20, 2010 9:48 AM  Refills Requested: Medication #1:  DIAZEPAM 10 MG TABS Take 1 tablet by mouth once a day   Dosage confirmed as above?Dosage Confirmed   Supply Requested: 1 month   Last Refilled: 12/09/2009  Medication #2:  HYDROCODONE-ACETAMINOPHEN 7.5-500 MG TABS Take 1 tablet by mouth three times a day   Dosage confirmed as above?Dosage Confirmed   Supply Requested: 1 month   Last Refilled: 12/09/2009 Please advise.  Next Appointment Scheduled: 03/10/10 O'Sullivan,NP Initial call taken by: Mervin Kung CMA (AAMA),  January 20, 2010 9:49 AM  Follow-up for Phone Call        OK to refil one month.  Additional Follow-up for Phone Call Additional follow up Details #1::        Rxs refilled x 1 month. Pt notified. Nicki Guadalajara Fergerson CMA (AAMA)  January 20, 2010 11:06 AM     Prescriptions: HYDROCODONE-ACETAMINOPHEN 7.5-500 MG TABS (HYDROCODONE-ACETAMINOPHEN) Take 1 tablet by mouth three times a day  #90 x 0   Entered by:   Mervin Kung CMA (AAMA)   Authorized by:   Lemont Fillers FNP   Signed by:   Lemont Fillers FNP on 01/20/2010   Method used:   Telephoned to ...       [facility] [address] [phone]* (retail)       [address]       [place], [place]  [address]       Ph: [phone]       Fax: [phone]   RxID:   [id number] DIAZEPAM 10 MG TABS (DIAZEPAM) Take 1 tablet by mouth once a day  #30 x 0   Entered by:   Mervin Kung CMA (AAMA)   Authorized by:   Lemont Fillers FNP   Signed by:   Lemont Fillers FNP on 01/20/2010   Method used:   Telephoned to ...       [facility] [address] [phone]* (retail)       [address]       [place], [place]  [address]       Ph: [phone]       Fax: [phone]   RxID:   [id number]

## 2010-02-06 NOTE — Assessment & Plan Note (Signed)
Summary: cough congestion/mhf--Rm 5   Vital Signs:  Patient profile:   70 year old male Height:      70 inches Weight:      294 pounds BMI:     42.34 O2 Sat:      93 % Temp:     97.5 degrees F oral Pulse rate:   66 / minute Pulse rhythm:   regular Resp:     18 per minute BP sitting:   120 / 70  (right arm) Cuff size:   large  Vitals Entered By: Mervin Kung CMA Duncan Dull) (December 23, 2009 2:26 PM) Is Patient Diabetic? Yes Pain Assessment Patient in pain? no      Comments Pt agrees all med doses and directions are correct. Nicki Guadalajara Fergerson CMA Duncan Dull)  December 23, 2009 2:32 PM    Primary Care Provider:  Lemont Fillers FNP   History of Present Illness: Pt is here today wit complaint of cough. Onset of symptoms was 2 weeks ago. Symptom characterized as coarse productive cough- yellow sputum.   problem associated with wheezing  and  night sweats, but not associated with fever Symptoms improved by advil. Symptoms are worst in the AM when he wakes up.   Energy is fair.     Allergies (verified): No Known Drug Allergies  Past History:  Past Medical History: Last updated: 08/06/2009 Arthritis Depression Diabetes History of Stomach Ulcer HTN Hypercholesterolemia Blood Transfusion due to bleeding ulcer  Past Surgical History: Last updated: 12/09/2009 August 2011 L cataract surgery (Dr. Dione Booze)  Review of Systems       The patient complains of weight loss and prolonged cough.  The patient denies anorexia, fever, dyspnea on exertion, and difficulty walking.         see HPI, has lost 15 pounds with diet and exercise.  Denies peripheral edema  Physical Exam  General:  Obese white male, awake, alert and in NAD Head:  Normocephalic and atraumatic without obvious abnormalities. No apparent alopecia or balding. Eyes:  No corneal or conjunctival inflammation noted. EOMI. Perrla. Funduscopic exam benign, without hemorrhages, exudates or papilledema. Vision  grossly normal. Ears:  External ear exam shows no significant lesions or deformities.  Otoscopic examination reveals clear canals, tympanic membranes are intact bilaterally without bulging, retraction, inflammation or discharge. Hearing is grossly normal bilaterally. Mouth:  Oral mucosa and oropharynx without lesions or exudates.  Teeth in good repair. Neck:  No deformities, masses, or tenderness noted. Lungs:  Bilateral expiratory wheeze, no increased work of breathing. Post neb treatement, resolution of wheezing is noted.  Heart:  Normal rate and regular rhythm. S1 and S2 normal without gallop, murmur, click, rub or other extra sounds. Extremities:  No clubbing, cyanosis, edema Neurologic:  No cranial nerve deficits noted. Station and gait are normal. Plantar reflexes are down-going bilaterally. DTRs are symmetrical throughout. Sensory, motor and coordinative functions appear intact. Skin:  Dry appearing skin Psych:  Cognition and judgment appear intact. Alert and cooperative with normal attention span and concentration. No apparent delusions, illusions, hallucinations   Impression & Recommendations:  Problem # 1:  BRONCHITIS, ACUTE WITH MILD BRONCHOSPASM (ICD-466.0) Assessment New Chest x-ray was performed today- no acute changes.  Pt responded well to albuterol neb in office today.  Will plan to treat with [facility] short term as well as Pro-air as needed.  Treat patient with zithromax for bronchitis.  F/u in 1 week. Patient was given sample of Dulera- [id number] exp apr 2012  His updated medication list  for this problem includes:    Dulera 100-5 Mcg/act Aero (Mometasone furo-formoterol fum) .[name]... 2 puffs twice daily    Proair Hfa 108 (90 Base) Mcg/act Aers (Albuterol sulfate) .[name]... 2 puffs every 6 hours as needed for cough/wheezing    Zithromax Z-pak 250 Mg Tabs (Azithromycin) .[name]... 2 tabs by mouth today, then one tablet by mouth daily for 4 additional days  Complete Medication List: 1)   Diazepam 10 Mg Tabs (Diazepam) .... Take 1 tablet by mouth once a day 2)  Metformin Hcl 1000 Mg Tabs (Metformin hcl) .... Take 1/2  tablet by mouth two times a day 3)  Hydrocodone-acetaminophen 7.5-500 Mg Tabs (Hydrocodone-acetaminophen) .... Take 1 tablet by mouth three times a day 4)  Diltiazem Hcl Er Beads 180 Mg Xr24h-cap (Diltiazem hcl er beads) .... Take 1 tablet by mouth once a day. 5)  Glimepiride 4 Mg Tabs (Glimepiride) .... Take 1 tablet by mouth once a day 6)  Omeprazole 20 Mg Cpdr (Omeprazole) .... Take 1 capsule by mouth once a day 7)  Paroxetine Hcl 30 Mg Tabs (Paroxetine hcl) .... Take 1 tablet by mouth once a day 8)  Lipitor 40 Mg Tabs (Atorvastatin calcium) .... Take 1 tablet by mouth once a day. 9)  Losartan Potassium 100 Mg Tabs (Losartan potassium) .... Take 1 tablet by mouth once a day 10)  Ecotrin Low Strength 81 Mg Tbec (Aspirin) .... Take 1 tablet by mouth once a day 11)  Hydrochlorothiazide 25 Mg Tabs (Hydrochlorothiazide) .... One tablet by mouth daily 12)  Accu-chek Aviva Strp (Glucose blood) .... Check blood sugar once a day.  call if you sugar is over 250 or under 80. 13)  Lancets Misc (Lancets) .... Use as directed 14)  Dulera 100-5 Mcg/act Aero (Mometasone furo-formoterol fum) .... 2 puffs twice daily 15)  Proair Hfa 108 (90 Base) Mcg/act Aers (Albuterol sulfate) .... 2 puffs every 6 hours as needed for cough/wheezing 16)  Zithromax Z-pak 250 Mg Tabs (Azithromycin) .... 2 tabs by mouth today, then one tablet by mouth daily for 4 additional days  Other Orders: CXR- 2view (CXR) Albuterol Sulfate Sol 1mg  unit dose (Z6109) Nebulizer Tx (60454)  Patient Instructions: 1)  Call if your symptoms worsen, if you develop fever, or if your symptoms are not improved in 48-72 hours.   2)  Follow up in 1 week.  Prescriptions: ZITHROMAX Z-PAK 250 MG TABS (AZITHROMYCIN) 2 tabs by mouth today, then one tablet by mouth daily for 4 additional days  #1 pack x 0   Entered and  Authorized by:   Lemont Fillers FNP   Signed by:   Lemont Fillers FNP on 12/23/2009   Method used:   Electronically to        [facility] [phone]* (retail)       [address]       [place], [place]  [address]       Ph: [phone]       Fax: [phone]   RxID:   [id number] PROAIR HFA 108 (90 BASE) MCG/ACT AERS (ALBUTEROL SULFATE) 2 puffs every 6 hours as needed for cough/wheezing  #1 x 0   Entered and Authorized by:   Lemont Fillers FNP   Signed by:   Lemont Fillers FNP on 12/23/2009   Method used:   Electronically to        [facility] [phone]* (retail)       [address]  [place], [place]  [address]       Ph: [phone]       Fax: [phone]   RxID:   [id number] DULERA 100-5 MCG/ACT AERO (MOMETASONE FURO-FORMOTEROL FUM) 2 puffs twice daily  #1 x 0   Entered and Authorized by:   Lemont Fillers FNP   Signed by:   Lemont Fillers FNP on 12/23/2009   Method used:   Samples Given   RxID:   [id number]    Medication Administration  Medication # 1:    Medication: Albuterol Sulfate Sol 1mg  unit dose    Diagnosis: CHRONIC OBSTRUCTIVE PULMONARY DISEASE (ICD-496)    Dose: 2.5mg  / 3mL    Route: inhaled    Exp Date: 07/05/2010    Lot #: [id number]    Mfr: [employer]    Patient tolerated medication without complications    Given by: Mervin Kung CMA Duncan Dull) (December 23, 2009 3:25 PM)  Orders Added: 1)  CXR- 2view [CXR] 2)  Albuterol Sulfate Sol 1mg  unit dose [J7613] 3)  Nebulizer Tx [94640] 4)  Est. Patient Level IV [38756]    Current Allergies (reviewed today): No known allergies    Vital Signs:  Patient Profile:   70 year old male Height:     70 inches Weight:      294 pounds BMI:     42.34 O2 Sat:      93 % O2 treatment:    Room Air Temp:     97.5 degrees F oral Pulse rate:   66 / minute Pulse rhythm:   regular Resp:     18 per minute BP sitting:   120 / 70 Cuff size:    large

## 2010-02-06 NOTE — Assessment & Plan Note (Signed)
Summary: 1 WEEK FOLLOW UP/MHF--Rm 5   Vital Signs:  Patient profile:   70 year old male Height:      70 inches Weight:      298.50 pounds BMI:     42.99 Temp:     97.6 degrees F oral Pulse rate:   60 / minute Pulse rhythm:   regular Resp:     18 per minute BP sitting:   122 / 70  (right arm) Cuff size:   large  Vitals Entered By: Mervin Kung CMA Duncan Dull) (December 31, 2009 9:44 AM) CC: Pt here for 1 week follow up. Is Patient Diabetic? Yes Comments Pt has completed Zpack. Christian Morris CMA Duncan Dull)  December 31, 2009 9:49 AM    Primary Care Provider:  Lemont Fillers FNP  CC:  Pt here for 1 week follow up.Christian Morris  History of Present Illness: [name] is a 70 year old male who presents today for follow up of his bronchitis.    1) Bronchitis- Last visit he was treated with zithromax and dulera/albuterol for reactive airway component. Cough is improved.  Completed antibiotic.  He continues to use the inhaler.  Denies fever.   Patient continues to walk 5 miles a day.    2) ED-  reports  that he has history of ED for which he uses Viagra-  has not used in about 1.5 years.  Requesting refills.   Has used 100 mg tabs in the past.   Allergies (verified): 1)  ! Christian Morris  Past History:  Past Medical History: Last updated: 08/06/2009 Arthritis Depression Diabetes History of Stomach Ulcer HTN Hypercholesterolemia Blood Transfusion due to bleeding ulcer  Past Surgical History: Last updated: 12/09/2009 August 2011 L cataract surgery (Dr. Dione Booze)  Family History: Reviewed history from 08/06/2009 and no changes required. Mother-- Arthritis, deceased ? Father-- Deceased ?cause 1/2 brother--deceased heart attack 1 brother--deceased, MI 1 sister-- deceased, MI  Social History: Reviewed history from 08/06/2009 and no changes required. 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 Quit smoking in 1984 Alcohol use-no Regular exercise-yes  Review of  Systems       see HPI  Physical Exam  General:  Well-developed,well-nourished,in no acute distress; alert,appropriate and cooperative throughout examination Head:  Normocephalic and atraumatic without obvious abnormalities. No apparent alopecia or balding. Lungs:  Normal respiratory effort, chest expands symmetrically. Lungs are clear to auscultation, no crackles or wheezes. Heart:  Normal rate and regular rhythm. S1 and S2 normal without gallop, murmur, click, rub or other extra sounds. Extremities:  No peripheral edema noted Psych:  Cognition and judgment appear intact. Alert and cooperative with normal attention span and concentration. No apparent delusions, illusions, hallucinations   Impression & Recommendations:  Problem # 1:  BRONCHITIS, ACUTE WITH MILD BRONCHOSPASM (ICD-466.0) Assessment Improved Clinically improved.  Pt completed abx and dulera.  Recommended that he continue the dulera for 1 more week then discontinue.   The following medications were removed from the medication list:    Zithromax Z-pak 250 Mg Tabs (Azithromycin) .Christian Morris... 2 tabs by mouth today, then one tablet by mouth daily for 4 additional days His updated medication list for this problem includes:    Dulera 100-5 Mcg/act Aero (Mometasone furo-formoterol fum) .Christian Morris... 2 puffs twice daily    Proair Hfa 108 (90 Base) Mcg/act Aers (Albuterol sulfate) .Christian Morris... 2 puffs every 6 hours as needed for cough/wheezing  Problem # 2:  ERECTILE DYSFUNCTION, ORGANIC (YNW-295.62) Assessment: Comment Only Will send rx for  viagra. His updated medication list for this problem includes:    Viagra 100 Mg Tabs (Sildenafil citrate) ..... One tablet 30 minutes prior to sexual activity  Complete Medication List: 1)  Diazepam 10 Mg Tabs (Diazepam) .... Take 1 tablet by mouth once a day 2)  Metformin Hcl 1000 Mg Tabs (Metformin hcl) .... Take 1/2  tablet by mouth two times a day 3)  Hydrocodone-acetaminophen 7.5-500 Mg Tabs  (Hydrocodone-acetaminophen) .... Take 1 tablet by mouth three times a day 4)  Diltiazem Hcl Er Beads 180 Mg Xr24h-cap (Diltiazem hcl er beads) .... Take 1 tablet by mouth once a day. 5)  Glimepiride 4 Mg Tabs (Glimepiride) .... Take 1 tablet by mouth once a day 6)  Omeprazole 20 Mg Cpdr (Omeprazole) .... Take 1 capsule by mouth once a day 7)  Paroxetine Hcl 30 Mg Tabs (Paroxetine hcl) .... Take 1 tablet by mouth once a day 8)  Lipitor 40 Mg Tabs (Atorvastatin calcium) .... Take 1 tablet by mouth once a day. 9)  Losartan Potassium 100 Mg Tabs (Losartan potassium) .... Take 1 tablet by mouth once a day 10)  Ecotrin Low Strength 81 Mg Tbec (Aspirin) .... Take 1 tablet by mouth once a day 11)  Hydrochlorothiazide 25 Mg Tabs (Hydrochlorothiazide) .... One tablet by mouth daily 12)  Accu-chek Aviva Strp (Glucose blood) .... Check blood sugar once a day.  call if you sugar is over 250 or under 80. 13)  Lancets Misc (Lancets) .... Use as directed 14)  Dulera 100-5 Mcg/act Aero (Mometasone furo-formoterol fum) .... 2 puffs twice daily 15)  Proair Hfa 108 (90 Base) Mcg/act Aers (Albuterol sulfate) .... 2 puffs every 6 hours as needed for cough/wheezing 16)  Viagra 100 Mg Tabs (Sildenafil citrate) .... One tablet 30 minutes prior to sexual activity  Patient Instructions: 1)  Continue Dulera for 1 more week- then stop. 2)  Follow up in March as scheduled.  3)  Happy New Year! Prescriptions: VIAGRA 100 MG TABS (SILDENAFIL CITRATE) one tablet 30 minutes prior to sexual activity  #6 x 5   Entered and Authorized by:   Lemont Fillers FNP   Signed by:   Lemont Fillers FNP on 12/31/2009   Method used:   Electronically to        [facility] [phone]* (retail)       [address]       [place], [place]  [address]       Ph: [phone]       Fax: [phone]   RxID:   [id number]    Orders Added: 1)  Est. Patient Level III [16606]     Current Allergies (reviewed today): !  ASA

## 2010-02-12 NOTE — Progress Notes (Signed)
Summary: refill-- lipitor, cardizem, paxil & glimepiride  Phone Note Refill Request Message from:  Fax from Pharmacy on February 03, 2010 4:37 PM  Refills Requested: Medication #1:  LIPITOR 40 MG TABS Take 1 tablet by mouth once a day.   Dosage confirmed as above?Dosage Confirmed   Brand Name Necessary? No   Supply Requested: 1 month   Last Refilled: 01/02/2010  Medication #2:  GLIMEPIRIDE 4 MG TABS Take 1 tablet by mouth once a day   Dosage confirmed as above?Dosage Confirmed   Brand Name Necessary? No   Supply Requested: 1 month   Last Refilled: 01/06/2010  Medication #3:  cardizem cd 180mg /24cap   Dosage confirmed as above?Dosage Confirmed   Brand Name Necessary? No   Supply Requested: 1 month   Last Refilled: 01/02/2010  Medication #4:  paxil 30 mg tab take 1 tab by mouth every day in am   Dosage confirmed as above?Dosage Confirmed   Brand Name Necessary? No   Supply Requested: 1 month   Last Refilled: 01/03/2010 [facility] [address] [place] fax [phone]   Method Requested: Electronic Next Appointment Scheduled: 03-10-10 Candia Kingsbury  Initial call taken by: Roselle Locus,  February 03, 2010 4:40 PM  Follow-up for Phone Call        Pt notified that rxs were refilled. Nicki Guadalajara Fergerson CMA Duncan Dull)  February 03, 2010 4:57 PM     Prescriptions: DILTIAZEM HCL ER BEADS 180 MG XR24H-CAP (DILTIAZEM HCL ER BEADS) Take 1 tablet by mouth once a day.  #30 x 1   Entered by:   Mervin Kung CMA (AAMA)   Authorized by:   Lemont Fillers FNP   Signed by:   Mervin Kung CMA (AAMA) on 02/03/2010   Method used:   Electronically to        [facility] [phone]* (retail)       [address]       [place], [place]  [address]       Ph: [phone]       Fax: [phone]   RxID:   [id number] GLIMEPIRIDE 4 MG TABS (GLIMEPIRIDE) Take 1 tablet by mouth once a day  #30 x 1   Entered by:   Mervin Kung CMA (AAMA)   Authorized by:   Lemont Fillers FNP   Signed by:   Mervin Kung CMA (AAMA) on 02/03/2010   Method used:   Electronically to        [facility] [phone]* (retail)       [address]       [place], [place]  [address]       Ph: [phone]       Fax: [phone]   RxID:   [id number] PAROXETINE HCL 30 MG TABS (PAROXETINE HCL) Take 1 tablet by mouth once a day  #30 x 1   Entered by:   Mervin Kung CMA (AAMA)   Authorized by:   Lemont Fillers FNP   Signed by:   Mervin Kung CMA (AAMA) on 02/03/2010   Method used:   Electronically to        [facility] [phone]* (retail)       [address]       [place], [place]  [address]       Ph: [phone]       Fax: [phone]   RxID:   [id number] LIPITOR 40 MG TABS (ATORVASTATIN CALCIUM) Take 1 tablet by mouth once a  day.  #30 x 1   Entered by:   Mervin Kung CMA (AAMA)   Authorized by:   Lemont Fillers FNP   Signed by:   Mervin Kung CMA (AAMA) on 02/03/2010   Method used:   Electronically to        [facility] [phone]* (retail)       [address]       [place], [place]  [address]       Ph: [phone]       Fax: [phone]   RxID:   [id number]

## 2010-02-20 NOTE — Letter (Signed)
Summary: CMN for CPAP/[facility]  CMN for CPAP/[facility]   Imported By: Lanelle Bal 02/14/2010 10:26:57  _____________________________________________________________________  External Attachment:    Type:   Image     Comment:   External Document

## 2010-02-25 ENCOUNTER — Telehealth: Payer: Self-pay | Admitting: Family

## 2010-03-04 NOTE — Progress Notes (Signed)
Summary: refill-diazepam  Phone Note Refill Request Message from:  Fax from Pharmacy on February 25, 2010 9:54 AM  Refills Requested: Medication #1:  DIAZEPAM 10 MG TABS Take 1 tablet by mouth once a day   Dosage confirmed as above?Dosage Confirmed   Brand Name Necessary? No   Supply Requested: 1 month   Last Refilled: 01/20/2010 [facility] [address] [place] [address] fax [phone]    Method Requested: Electronic Next Appointment Scheduled: 3.5.12 Shalva Rozycki Initial call taken by: Elba Barman,  February 25, 2010 9:55 AM  Follow-up for Phone Call        Refill left on pharmacy voicemail. Nicki Guadalajara Fergerson CMA (AAMA)  February 25, 2010 11:26 AM     Prescriptions: DIAZEPAM 10 MG TABS (DIAZEPAM) Take 1 tablet by mouth once a day  #30 x 0   Entered by:   Mervin Kung CMA (AAMA)   Authorized by:   Lemont Fillers FNP   Signed by:   Mervin Kung CMA (AAMA) on 02/25/2010   Method used:   Telephoned to ...       [facility] [address] [phone]* (retail)       [address]       [place], [place]  [address]       Ph: [phone]       Fax: [phone]   RxID:   [id number]

## 2010-03-10 ENCOUNTER — Encounter: Payer: Self-pay | Admitting: Family

## 2010-03-10 ENCOUNTER — Ambulatory Visit (INDEPENDENT_AMBULATORY_CARE_PROVIDER_SITE_OTHER): Payer: Medicare PPO | Admitting: Family

## 2010-03-10 DIAGNOSIS — I10 Essential (primary) hypertension: Secondary | ICD-10-CM

## 2010-03-10 DIAGNOSIS — E119 Type 2 diabetes mellitus without complications: Secondary | ICD-10-CM

## 2010-03-10 DIAGNOSIS — M545 Low back pain: Secondary | ICD-10-CM

## 2010-03-10 LAB — CONVERTED CEMR LAB
CO2: 26 meq/L (ref 19–32)
Calcium: 9.7 mg/dL (ref 8.4–10.5)
Hgb A1c MFr Bld: 6.2 % — ABNORMAL HIGH (ref <5.7)
Potassium: 5 meq/L (ref 3.5–5.3)

## 2010-03-10 LAB — HM DIABETES FOOT EXAM

## 2010-03-18 NOTE — Letter (Signed)
   [place] [facility] [address] [place], [place]  [address]  [place] Phone: [phone]      March 10, 2010   Christian Morris [address] [place], [place] [address]  RE:  LAB RESULTS  Dear  Mr. Hubka,  The following is an interpretation of your most recent lab tests.  Please take note of any instructions provided or changes to medications that have resulted from your lab work.   DIABETIC STUDIES:  Excellent - no changes needed Blood Glucose: 103   HgbA1C: 6.2   Microalbumin/Creatinine Ratio: 28.6      Sincerely Yours,    Lemont Fillers FNP  Appended Document:  Mailed.

## 2010-03-18 NOTE — Assessment & Plan Note (Signed)
Summary: 3 month fu/mhf--rm 4   Vital Signs:  Patient profile:   70 year old male Height:      70 inches Weight:      297 pounds BMI:     42.77 Temp:     97.4 degrees F oral Pulse rate:   56 / minute Pulse rhythm:   regular Resp:     18 per minute BP sitting:   120 / 84  (right arm) Cuff size:   large  Vitals Entered By: Mervin Kung CMA Duncan Dull) (March 10, 2010 9:08 AM) CC: Pt here for 3 month follow up. Is Patient Diabetic? Yes Pain Assessment Patient in pain? no      Comments Pt needs refills on Hydrocodone, glimepiride and HCTZ. Nicki Guadalajara Fergerson CMA Duncan Dull)  March 10, 2010 9:16 AM    Primary Care Provider:  Lemont Fillers FNP  CC:  Pt here for 3 month follow up.[name]  History of Present Illness: Christian Morris is a 70 year old male who presents today for follow up.   1) HTN- denies LE edema, swelling, chest pain.   2) DM- has had several episodes of hypoglycemia.  One episode was 11:45 AM, another time he woke up shaky.  3) Low back pain-  pain unchanged,  able to walk when he takes lortab.  Walking 3-5 miles 6 days a week.    4) Callus on foot- notes that this happend with a new pair of shoes.   Allergies: 1)  ! Jonne Ply  Past History:  Past Medical History: Last updated: 08/06/2009 Arthritis Depression Diabetes History of Stomach Ulcer HTN Hypercholesterolemia Blood Transfusion due to bleeding ulcer  Past Surgical History: Last updated: 12/09/2009 August 2011 L cataract surgery (Dr. Dione Booze)  Review of Systems       see HPI  Physical Exam  General:  Well-developed,well-nourished,in no acute distress; alert,appropriate and cooperative throughout examination Head:  Normocephalic and atraumatic without obvious abnormalities. No apparent alopecia or balding. Lungs:  Normal respiratory effort, chest expands symmetrically. Lungs are clear to auscultation, no crackles or wheezes. Heart:  Normal rate and regular rhythm. S1 and S2 normal without gallop,  murmur, click, rub or other extra sounds.  Diabetes Management Exam:    Foot Exam (with socks and/or shoes not present):       Sensory-Monofilament:          Left foot: normal       Inspection:          Left foot: abnormal             Comments: hyperpigmented brown callous noted  at base of right toe          Right foot: abnormal             Comments: hyperpigmented brown callous noted  at base of left t toe   Impression & Recommendations:  Problem # 1:  LOW BACK PAIN, CHRONIC (ICD-724.2) Assessment Unchanged Continue vicodin as below. His updated medication list for this problem includes:    Hydrocodone-acetaminophen 7.5-500 Mg Tabs (Hydrocodone-acetaminophen) .[name]... Take 1 tablet by mouth three times a day    Ecotrin Low Strength 81 Mg Tbec (Aspirin) .[name]... Take 1 tablet by mouth once a day  Problem # 2:  DIABETES MELLITUS, TYPE II (ICD-250.00) Assessment: Comment Only Has had some symptomatic hypoglycemia.  Will cut back his glimepiride to 2mg  by mouth daily, check a1c.  Pt has callouses on both feet.  No sign of infection.  Recommended to patient that he call if areas become red/painful, or if he develops drainage. His updated medication list for this problem includes:    Metformin Hcl 1000 Mg Tabs (Metformin hcl) .[name]... Take 1/2  tablet by mouth two times a day    Glimepiride 4 Mg Tabs (Glimepiride) .[name]... Take 1/2  tablet by mouth once a day    Losartan Potassium 100 Mg Tabs (Losartan potassium) .[name]... Take 1 tablet by mouth once a day    Ecotrin Low Strength 81 Mg Tbec (Aspirin) .[name]... Take 1 tablet by mouth once a day  Orders: T-Hgb A1C (04540-98119) TLB-BMP (Basic Metabolic Panel-BMET) (80048-METABOL)  Problem # 3:  HYPERTENSION (ICD-401.9) Assessment: Unchanged BP stable, continue current meds. His updated medication list for this problem includes:    Diltiazem Hcl Er Beads 180 Mg Xr24h-cap (Diltiazem hcl er beads) .[name]... Take 1 tablet by mouth once a day.    Losartan  Potassium 100 Mg Tabs (Losartan potassium) .[name]... Take 1 tablet by mouth once a day    Hydrochlorothiazide 25 Mg Tabs (Hydrochlorothiazide) ..... One tablet by mouth daily  Orders: TLB-BMP (Basic Metabolic Panel-BMET) (80048-METABOL)  BP today: 120/84 Prior BP: 122/70 (12/31/2009)  Labs Reviewed: K+: 4.4 (12/09/2009) Creat: : 1.00 (12/09/2009)   Chol: 145 (09/06/2009)   HDL: 33 (09/06/2009)   LDL: 74 (09/06/2009)   TG: 189 (09/06/2009)  Complete Medication List: 1)  Diazepam 10 Mg Tabs (Diazepam) .... Take 1 tablet by mouth once a day 2)  Metformin Hcl 1000 Mg Tabs (Metformin hcl) .... Take 1/2  tablet by mouth two times a day 3)  Hydrocodone-acetaminophen 7.5-500 Mg Tabs (Hydrocodone-acetaminophen) .... Take 1 tablet by mouth three times a day 4)  Diltiazem Hcl Er Beads 180 Mg Xr24h-cap (Diltiazem hcl er beads) .... Take 1 tablet by mouth once a day. 5)  Glimepiride 4 Mg Tabs (Glimepiride) .... Take 1/2  tablet by mouth once a day 6)  Omeprazole 20 Mg Cpdr (Omeprazole) .... Take 1 capsule by mouth once a day 7)  Paroxetine Hcl 30 Mg Tabs (Paroxetine hcl) .... Take 1 tablet by mouth once a day 8)  Lipitor 40 Mg Tabs (Atorvastatin calcium) .... Take 1 tablet by mouth once a day. 9)  Losartan Potassium 100 Mg Tabs (Losartan potassium) .... Take 1 tablet by mouth once a day 10)  Ecotrin Low Strength 81 Mg Tbec (Aspirin) .... Take 1 tablet by mouth once a day 11)  Hydrochlorothiazide 25 Mg Tabs (Hydrochlorothiazide) .... One tablet by mouth daily 12)  Accu-chek Aviva Strp (Glucose blood) .... Check blood sugar once a day.  call if you sugar is over 250 or under 80. 13)  Lancets Misc (Lancets) .... Use as directed 14)  Proair Hfa 108 (90 Base) Mcg/act Aers (Albuterol sulfate) .... 2 puffs every 6 hours as needed for cough/wheezing 15)  Viagra 100 Mg Tabs (Sildenafil citrate) .... One tablet 30 minutes prior to sexual activity  Patient Instructions: 1)  Please complete your lab work on the  first floor. 2)  Follow up in 3 months, sooner if problems or concerns. Prescriptions: HYDROCHLOROTHIAZIDE 25 MG TABS (HYDROCHLOROTHIAZIDE) one tablet by mouth daily  #30 x 2   Entered and Authorized by:   Lemont Fillers FNP   Signed by:   Lemont Fillers FNP on 03/10/2010   Method used:   Electronically to        [facility] [phone]* (retail)       [address]  [place], [place]  [address]       Ph: [phone]       Fax: [phone]   RxID:   [id number] GLIMEPIRIDE 4 MG TABS (GLIMEPIRIDE) Take 1 tablet by mouth once a day  #30 x 2   Entered and Authorized by:   Lemont Fillers FNP   Signed by:   Lemont Fillers FNP on 03/10/2010   Method used:   Electronically to        [facility] [phone]* (retail)       [address]       [place], [place]  [address]       Ph: [phone]       Fax: [phone]   RxID:   [id number] HYDROCODONE-ACETAMINOPHEN 7.5-500 MG TABS (HYDROCODONE-ACETAMINOPHEN) Take 1 tablet by mouth three times a day  #90 x 0   Entered and Authorized by:   Lemont Fillers FNP   Signed by:   Lemont Fillers FNP on 03/10/2010   Method used:   Print then Give to Patient   RxID:   [id number]    Orders Added: 1)  T-Hgb A1C [83036-23375] 2)  TLB-BMP (Basic Metabolic Panel-BMET) [80048-METABOL] 3)  Est. Patient Level III [[address]]    Current Allergies (reviewed today): ! ASA

## 2010-03-24 ENCOUNTER — Telehealth: Payer: Self-pay | Admitting: Family

## 2010-03-25 ENCOUNTER — Telehealth: Payer: Self-pay | Admitting: Family

## 2010-03-25 NOTE — Telephone Encounter (Signed)
Med refilled on 03/24/10. Left detailed message on pharmacy voicemail and to call if any questions.

## 2010-04-01 ENCOUNTER — Other Ambulatory Visit: Payer: Self-pay | Admitting: Family

## 2010-04-03 ENCOUNTER — Other Ambulatory Visit: Payer: Self-pay | Admitting: Family

## 2010-04-03 NOTE — Progress Notes (Signed)
Summary: refill--losartan  Phone Note Refill Request Message from:  Patient on March 24, 2010 1:05 PM  Refills Requested: Medication #1:  LOSARTAN POTASSIUM 100 MG TABS Take 1 tablet by mouth once a day   Dosage confirmed as above?Dosage Confirmed   Brand Name Necessary? No   Supply Requested: 1 month [facility] [address]  He is out    Method Requested: Electronic Next Appointment Scheduled: 06-09-2010 [facility]  Initial call taken by: Roselle Locus,  March 24, 2010 1:07 PM    Prescriptions: LOSARTAN POTASSIUM 100 MG TABS (LOSARTAN POTASSIUM) Take 1 tablet by mouth once a day  #30 x 3   Entered by:   Mervin Kung CMA (AAMA)   Authorized by:   Lemont Fillers FNP   Signed by:   Mervin Kung CMA (AAMA) on 03/24/2010   Method used:   Electronically to        [facility] [phone]* (retail)       [address]       [place], [place]  [address]       Ph: [phone]       Fax: [phone]   RxID:   [id number]

## 2010-04-06 ENCOUNTER — Other Ambulatory Visit: Payer: Self-pay | Admitting: Family

## 2010-04-07 ENCOUNTER — Other Ambulatory Visit: Payer: Self-pay | Admitting: Family

## 2010-04-16 ENCOUNTER — Telehealth: Payer: Self-pay | Admitting: Family

## 2010-04-16 NOTE — Telephone Encounter (Signed)
Refill- metformin 1000mg  tab. Take one tablet by mouth twice daily with meals. Qty 60. Last fill 12.29.11

## 2010-04-17 MED ORDER — METFORMIN HCL 1000 MG PO TABS: ORAL_TABLET | ORAL | Status: DC

## 2010-04-17 NOTE — Telephone Encounter (Signed)
Per 12/10/09 office note, Pt was instructed to take metformin 1000mg  1/2 twice a day.  Verified with pt that is how he has been taking medication. Refills sent to pharmacy.

## 2010-05-01 ENCOUNTER — Other Ambulatory Visit: Payer: Self-pay | Admitting: Internal Medicine

## 2010-05-02 ENCOUNTER — Other Ambulatory Visit: Payer: Self-pay | Admitting: Family

## 2010-05-02 NOTE — Telephone Encounter (Signed)
Refill left on pharmacy voicemail with instruction not to fill before 05/06/10.

## 2010-05-02 NOTE — Telephone Encounter (Signed)
Refill left on pharmacy voicemail. Message left on machine notifying pt of completion.

## 2010-05-02 NOTE — Telephone Encounter (Signed)
Yes, not to be refilled before 5/1.

## 2010-05-02 NOTE — Telephone Encounter (Signed)
Med last refilled 04/07/10. Is it ok to give 30 day supply not to be filled before 05/06/10? Please advise.

## 2010-05-02 NOTE — Telephone Encounter (Signed)
OK to give 30 day supply no refills.

## 2010-05-02 NOTE — Telephone Encounter (Signed)
Pt last filled rx 04/01/10. Has f/u for 06/09/10. Is it ok to give 30 day supply.

## 2010-05-19 ENCOUNTER — Ambulatory Visit (INDEPENDENT_AMBULATORY_CARE_PROVIDER_SITE_OTHER): Payer: Medicare PPO | Admitting: Family

## 2010-05-19 ENCOUNTER — Encounter: Payer: Self-pay | Admitting: Family

## 2010-05-19 DIAGNOSIS — E119 Type 2 diabetes mellitus without complications: Secondary | ICD-10-CM

## 2010-05-19 DIAGNOSIS — M722 Plantar fascial fibromatosis: Secondary | ICD-10-CM

## 2010-05-19 MED ORDER — CEPHALEXIN 500 MG PO CAPS: 500.0000 mg | ORAL_CAPSULE | Freq: Four times a day (QID) | ORAL | Status: AC

## 2010-05-19 MED ORDER — GLUCOSE BLOOD VI STRP: ORAL_STRIP | Status: DC

## 2010-05-19 MED ORDER — GLIMEPIRIDE 1 MG PO TABS: 1.0000 mg | ORAL_TABLET | Freq: Every day | ORAL | Status: DC

## 2010-05-19 NOTE — Assessment & Plan Note (Signed)
Suspect symptoms are due to plantar fasciitis. We'll avoid NSAIDs in this gentleman with peptic ulcer disease and diabetes. I did provide him with some patient education handouts on exercises to perform to help with his plantar fasciitis, and also recommended ice when necessary. I have also provided him with a prescription for Keflex, as I am concerned that the itching and redness of his stay on the heel could be an early cellulitis.

## 2010-05-19 NOTE — Progress Notes (Signed)
  Subjective:    Patient ID: Christian Morris, male    DOB: 10/06/1940, 70 y.o.   MRN: [id number]  HPI  Christian Morris is a 70 yr old male who presents today with chief complaint of right heel pain.  Symptoms started about 2 weeks ago.  Has sensation of itching under the skin.  Denies fever. The patient was walking 4 miles a day, but has had to stop this due to the pain.  DM2- reports that he has had several episodes of symptomatic hypoglycemia.     Review of Systems See history of present illness    Past Medical History  Diagnosis Date  . Arthritis   . Depression   . Diabetes mellitus   . Hypertension   . Hyperlipidemia   . History of gastric ulcer   . Transfusion history     due to bleeding ulcer    History   Social History  . Marital Status: Divorced    Spouse Name: N/A    Number of Children: N/A  . Years of Education: N/A   Occupational History  . Not on file.   Social History Main Topics  . Smoking status: Former Smoker    Types: Cigarettes    Quit date: 01/05/1982  . Smokeless tobacco: Not on file  . Alcohol Use: No  . Drug Use: Not on file  . Sexually Active: Not on file   Other Topics Concern  . 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    Past Surgical History  Procedure Date  . Eye surgery 08/2009    cataract removal left eye--Dr Dione Booze    Family History  Problem Relation Age of Onset  . Arthritis Mother   . Heart disease Sister     MI  . Heart disease Brother     MI    Allergies  Allergen Reactions  . Aspirin     REACTION: hx of ulcers    Current Outpatient Prescriptions on File Prior to Visit  Medication Sig Dispense Refill  . atorvastatin (LIPITOR) 40 MG tablet TAKE ONE TABLET BY MOUTH EVERY DAY  30 tablet  2  . diazepam (VALIUM) 10 MG tablet TAKE ONE TABLET BY MOUTH EVERY DAY  30 tablet  0  . diltiazem (DILACOR XR) 180 MG 24 hr capsule TAKE ONE CAPSULE BY  MOUTH EVERY DAY  30 capsule  2  . HYDROcodone-acetaminophen (LORTAB) 7.5-500 MG per tablet TAKE ONE TABLET BY MOUTH THREE TIMES DAILY  90 tablet  0  . metFORMIN (GLUCOPHAGE) 1000 MG tablet Take 1/2 tablet twice a day.  30 tablet  2  . PARoxetine (PAXIL) 30 MG tablet TAKE ONE TABLET BY MOUTH EVERY DAY  30 tablet  3    BP 110/60  Pulse 72  Temp(Src) 97.8 F (36.6 C) (Oral)  Resp 16  Ht 5\' 10"  (1.778 m)  Wt 299 lb (135.626 kg)  BMI 42.90 kg/m2     Objective:   Physical Exam    general: Overweight white male awake, alert, and in no acute distress Cardiovascular: S1-S2 regular rate and rhythm no murmurs noted Respiratory: Breath sounds are clear to auscultation bilaterally without wheezes rales or rhonchi Extremities: Right lateral lower extremities are callused, right heel is noted to be mildly reddened without swelling or tenderness to palpation.    Assessment & Plan:

## 2010-05-19 NOTE — Patient Instructions (Signed)
Call if you develop increased redness/swelling/pain of the right heel.  Do the exercises provided daily. Follow up in 2 weeks, sooner if problems or concerns.

## 2010-05-19 NOTE — Assessment & Plan Note (Signed)
Patient is currently taking Amaryl 2 mg by mouth daily with what seems to be episodes of symptomatic hypoglycemia. He has not been checking his blood sugar regularly due to his old meter. He was given a new meter today as well as per his carotid strips to fit his new meter. Instruction was provided by CNA for proper use of meter.

## 2010-05-30 ENCOUNTER — Ambulatory Visit: Payer: Medicare PPO

## 2010-06-04 ENCOUNTER — Encounter: Payer: Self-pay | Admitting: Family

## 2010-06-04 ENCOUNTER — Telehealth: Payer: Self-pay | Admitting: Family

## 2010-06-04 MED ORDER — HYDROCODONE-ACETAMINOPHEN 7.5-500 MG PO TABS: 1.0000 | ORAL_TABLET | Freq: Three times a day (TID) | ORAL | Status: DC

## 2010-06-04 MED ORDER — DIAZEPAM 10 MG PO TABS: 10.0000 mg | ORAL_TABLET | Freq: Every day | ORAL | Status: DC

## 2010-06-04 NOTE — Telephone Encounter (Signed)
Pt last seen 05/19/10 has f/u 06/10/10. Please advise re: refills.

## 2010-06-04 NOTE — Telephone Encounter (Signed)
OK to give 1 month supply of each with no refills.

## 2010-06-04 NOTE — Telephone Encounter (Signed)
Refill- diazepam 10mg  tab. Take one tablet by mouth every day. Qty 30. Last fill 4.27.12.   Refill- hydroco/acetamin 7.5-500mg  tab. Take one tablet by mouth three times daily. Qty 90. Last fill 5.1.12

## 2010-06-04 NOTE — Telephone Encounter (Signed)
Refills left on pharmacy voice mail 

## 2010-06-09 ENCOUNTER — Encounter: Payer: Self-pay | Admitting: Family

## 2010-06-09 ENCOUNTER — Ambulatory Visit (INDEPENDENT_AMBULATORY_CARE_PROVIDER_SITE_OTHER): Payer: Medicare PPO | Admitting: Family

## 2010-06-09 DIAGNOSIS — E785 Hyperlipidemia, unspecified: Secondary | ICD-10-CM

## 2010-06-09 DIAGNOSIS — M722 Plantar fascial fibromatosis: Secondary | ICD-10-CM

## 2010-06-09 DIAGNOSIS — E119 Type 2 diabetes mellitus without complications: Secondary | ICD-10-CM

## 2010-06-09 DIAGNOSIS — E875 Hyperkalemia: Secondary | ICD-10-CM

## 2010-06-09 DIAGNOSIS — L989 Disorder of the skin and subcutaneous tissue, unspecified: Secondary | ICD-10-CM

## 2010-06-09 LAB — HEPATIC FUNCTION PANEL
AST: 16 U/L (ref 0–37)
Bilirubin, Direct: 0.1 mg/dL (ref 0.0–0.3)
Indirect Bilirubin: 0.5 mg/dL (ref 0.0–0.9)
Total Bilirubin: 0.6 mg/dL (ref 0.3–1.2)
Total Protein: 7.8 g/dL (ref 6.0–8.3)

## 2010-06-09 LAB — BASIC METABOLIC PANEL
BUN: 24 mg/dL — ABNORMAL HIGH (ref 6–23)
Chloride: 98 mEq/L (ref 96–112)
Glucose, Bld: 104 mg/dL — ABNORMAL HIGH (ref 70–99)
Sodium: 138 mEq/L (ref 135–145)

## 2010-06-09 MED ORDER — HYDROCHLOROTHIAZIDE 25 MG PO TABS: 25.0000 mg | ORAL_TABLET | Freq: Every day | ORAL | Status: DC

## 2010-06-09 NOTE — Assessment & Plan Note (Signed)
I am concerned about basal cell carcinoma.  I recommended that patient be referred to dermatology.  He declines due to cost and says that he will only have it removed if I do it here in this office.

## 2010-06-09 NOTE — Assessment & Plan Note (Signed)
No further symptomatic hypoglycemia on decreased dose of glimepiride. Last A1C in March was 6.2.  Monitor.

## 2010-06-09 NOTE — Assessment & Plan Note (Signed)
Clinically improving.  

## 2010-06-09 NOTE — Patient Instructions (Addendum)
Please schedule a follow up visit for mole removal. Complete your lab work on the first floor.

## 2010-06-09 NOTE — Progress Notes (Signed)
Subjective:    Patient ID: Christian Morris, male    DOB: 1940/03/27, 70 y.o.   MRN: [id number]  HPI  Christian Morris is a 70 year old male who presents today for follow up.  1)Plantar fascitis- Right heel.  Feeling much  Better.  Purchased new shoes which have helped.  2) HTN- Denies swelling.   3) DM2- Reports that his sugar this AM was 113.  He reports that he has been feeling better since his glimepiride was reduced. Sugars have been running 98-200.     Review of Systems See HPI  Past Medical History  Diagnosis Date  . Arthritis   . Depression   . Diabetes mellitus   . Hypertension   . Hyperlipidemia   . History of gastric ulcer   . Transfusion history     due to bleeding ulcer    History   Social History  . Marital Status: Divorced    Spouse Name: N/A    Number of Children: N/A  . Years of Education: N/A   Occupational History  . Not on file.   Social History Main Topics  . Smoking status: Former Smoker    Types: Cigarettes    Quit date: 01/05/1982  . Smokeless tobacco: Not on file  . Alcohol Use: No  . Drug Use: Not on file  . Sexually Active: Not on file   Other Topics Concern  . 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    Past Surgical History  Procedure Date  . Eye surgery 08/2009    cataract removal left eye--Dr Dione Booze    Family History  Problem Relation Age of Onset  . Arthritis Mother   . Heart disease Sister     MI  . Heart disease Brother     MI  . Heart disease Brother     MI    Allergies  Allergen Reactions  . Aspirin     REACTION: hx of ulcers    Current Outpatient Prescriptions on File Prior to Visit  Medication Sig Dispense Refill  . aspirin 81 MG EC tablet Take 81 mg by mouth daily.        [name] atorvastatin (LIPITOR) 40 MG tablet TAKE ONE TABLET BY MOUTH EVERY DAY  30 tablet  2  . diazepam (VALIUM) 10 MG tablet Take 1 tablet (10 mg total) by mouth  daily.  30 tablet  0  . diltiazem (DILACOR XR) 180 MG 24 hr capsule TAKE ONE CAPSULE BY MOUTH EVERY DAY  30 capsule  2  . glimepiride (AMARYL) 1 MG tablet Take 1 tablet (1 mg total) by mouth daily before breakfast.  30 tablet  2  . glucose blood test strip Use as instructed  100 each  12  . HYDROcodone-acetaminophen (LORTAB) 7.5-500 MG per tablet Take 1 tablet by mouth 3 (three) times daily.  90 tablet  0  . losartan (COZAAR) 100 MG tablet Take 100 mg by mouth daily.        . metFORMIN (GLUCOPHAGE) 1000 MG tablet Take 1/2 tablet twice a day.  30 tablet  2  . omeprazole (PRILOSEC) 20 MG capsule Take 20 mg by mouth daily.        [name] PARoxetine (PAXIL) 30 MG tablet TAKE ONE TABLET BY MOUTH EVERY DAY  30 tablet  3  . sildenafil (VIAGRA) 100 MG tablet Take 1 tablet 30 minutes prior to sexual  activity.       [name] DISCONTD: hydrochlorothiazide 25 MG tablet Take 25 mg by mouth daily.        [name] albuterol (PROAIR HFA) 108 (90 BASE) MCG/ACT inhaler Inhale 2 puffs into the lungs every 6 (six) hours as needed.         BP 126/88  Pulse 56  Temp(Src) 97.8 F (36.6 C) (Oral)  Resp 16  Ht 5\' 10"  (1.778 m)  Wt 299 lb (135.626 kg)  BMI 42.90 kg/m2       Objective:   Physical Exam  Gen: awake, alert, overweight white male in NAD Head: [place]/AT CV: s1/s2, RRR, no LE edema Resp: BS CTA bilaterally without wheezes, rales, or rhonchi.  Psych: A and O x 3, calm and pleasant Skin: suspicious appearing lesion right cheek. Raised, round lesion- pink and irregular in color.         Assessment & Plan:

## 2010-06-10 ENCOUNTER — Encounter: Payer: Self-pay | Admitting: Family

## 2010-06-10 ENCOUNTER — Ambulatory Visit (INDEPENDENT_AMBULATORY_CARE_PROVIDER_SITE_OTHER): Payer: Medicare PPO | Admitting: Family

## 2010-06-10 VITALS — BP 120/70 | HR 56 | Temp 98.1°F | Resp 16 | Ht 70.0 in

## 2010-06-10 DIAGNOSIS — D485 Neoplasm of uncertain behavior of skin: Secondary | ICD-10-CM

## 2010-06-10 DIAGNOSIS — L989 Disorder of the skin and subcutaneous tissue, unspecified: Secondary | ICD-10-CM

## 2010-06-10 NOTE — Assessment & Plan Note (Signed)
See discussion above 

## 2010-06-10 NOTE — Patient Instructions (Signed)
Mole Excision Your caregiver has removed (excised) a mole. Most moles are benign (non cancerous). Some moles may change over time and require biopsy (tissue sample) and/or removal. The mole usually is removed by shaving or cutting it from the skin. You will have stitches in your skin if the mole is large. A small mole, or one that is shaved off, may require only a small bandage. Your caregiver will send a piece of the mole to the laboratory (pathology) to examine it under a microscope for signs of cancer. Make sure you get your biopsy results when you return for your follow-up visit. Call if there is no return visit. HOME CARE INSTRUCTIONS  If the biopsied area was the arm or leg, keep it raised (above the level of your heart) to decrease pain and swelling, if you are having any.   Keep the wound and dressing clean and dry. Clean as necessary.   If the dressing gets wet, remove it slowly and carefully. If it sticks, use warm, soapy water to gently loosen it. Pat the area dry with a clean towel before putting on another dressing.   Call in 3 to 4 days, or as directed, for the results of your biopsy.  SEEK IMMEDIATE MEDICAL CARE IF:  You have an oral temperature above 101 associated with inflammation (soreness) of the surgical site.   You have excess blood soaking through the dressing.   You have increasing pain and swelling in the wound.   You have numbness or swelling below the wound.   You have redness, swelling, pus, a bad smell, or red streaks coming away from the wound, or any other signs of infection.  MAKE SURE YOU:   Understand these instructions.   Will watch your condition.   Will get help right away if you are not doing well or get worse.  Document Released: 12/20/1999 Document Re-Released: 12/05/2007 [facility] Patient Information 2011 [place], [place].

## 2010-06-10 NOTE — Progress Notes (Signed)
  Subjective:    Patient ID: Christian Morris, male    DOB: Jun 04, 1940, 70 y.o.   MRN: [id number]  HPI [name] is a 70 year old male who presents today for mole excision.  He has two suspicious appearing moles noted on his right cheek.  Review of Systems     Objective:   Physical Exam  Skin:       Raised pink lesion noted on right upper cheek (Right upper lesion) another lesion just below that on right cheek (Right lower lesion)          Assessment & Plan:  Pt was informed of risks including infection/scarring. After informed consent area was cleansed with betadine and alcohol.  Then two lesions were removed from the right cheek using sterile technique and placed in two containers- marked R upper lesion and R lower lesion.

## 2010-06-10 NOTE — Progress Notes (Signed)
Addended by: Mervin Kung A on: 06/10/2010 05:21 PM   Modules accepted: Orders

## 2010-06-13 ENCOUNTER — Telehealth: Payer: Self-pay | Admitting: Family

## 2010-06-13 NOTE — Telephone Encounter (Signed)
Pt would like results from mole taken off at last visit.

## 2010-06-13 NOTE — Telephone Encounter (Signed)
Advised pt normal turnaround time is 5 days and to call around the middle of next week if he has not heard from [place]. Pt voices understanding and has f/u on 06/18/10.

## 2010-06-17 ENCOUNTER — Encounter: Payer: Self-pay | Admitting: Family

## 2010-06-18 ENCOUNTER — Encounter: Payer: Self-pay | Admitting: Family

## 2010-06-18 ENCOUNTER — Ambulatory Visit (INDEPENDENT_AMBULATORY_CARE_PROVIDER_SITE_OTHER): Payer: Medicare PPO | Admitting: Family

## 2010-06-18 DIAGNOSIS — L989 Disorder of the skin and subcutaneous tissue, unspecified: Secondary | ICD-10-CM

## 2010-06-18 DIAGNOSIS — E119 Type 2 diabetes mellitus without complications: Secondary | ICD-10-CM

## 2010-06-18 DIAGNOSIS — D485 Neoplasm of uncertain behavior of skin: Secondary | ICD-10-CM

## 2010-06-18 MED ORDER — DOXYCYCLINE HYCLATE 100 MG PO TABS: 100.0000 mg | ORAL_TABLET | Freq: Two times a day (BID) | ORAL | Status: AC

## 2010-06-18 NOTE — Assessment & Plan Note (Signed)
Path report is negative. Notes benign sebaceous cyst hyperplasia

## 2010-06-18 NOTE — Progress Notes (Addendum)
Subjective:    Patient ID: Christian Morris, male    DOB: Jan 19, 1940, 70 y.o.   MRN: [id number]  HPI  Christian Morris is a 70 year old male who presents today for followup of his skin lesion removal. He reports that the area is healing well.  He also Several week history of a lesion on his left buttock which is tender.  DM2- notes some difficulty at times using his meter.    Review of Systems See HPI  Past Medical History  Diagnosis Date  . Arthritis   . Depression   . Diabetes mellitus   . Hypertension   . Hyperlipidemia   . History of gastric ulcer   . Transfusion history     due to bleeding ulcer    History   Social History  . Marital Status: Divorced    Spouse Name: N/A    Number of Children: N/A  . Years of Education: N/A   Occupational History  . Not on file.   Social History Main Topics  . Smoking status: Former Smoker    Types: Cigarettes    Quit date: 01/05/1982  . Smokeless tobacco: Not on file  . Alcohol Use: No  . Drug Use: Not on file  . Sexually Active: Not on file   Other Topics Concern  . 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    Past Surgical History  Procedure Date  . Eye surgery 08/2009    cataract removal left eye--Dr Dione Booze    Family History  Problem Relation Age of Onset  . Arthritis Mother   . Heart disease Sister     MI  . Heart disease Brother     MI  . Heart disease Brother     MI    Allergies  Allergen Reactions  . Aspirin     REACTION: hx of ulcers    Current Outpatient Prescriptions on File Prior to Visit  Medication Sig Dispense Refill  . albuterol (PROAIR HFA) 108 (90 BASE) MCG/ACT inhaler Inhale 2 puffs into the lungs every 6 (six) hours as needed.       [name] aspirin 81 MG EC tablet Take 81 mg by mouth daily.        [name] atorvastatin (LIPITOR) 40 MG tablet TAKE ONE TABLET BY MOUTH EVERY DAY  30 tablet  2  . diazepam (VALIUM) 10 MG tablet  Take 1 tablet (10 mg total) by mouth daily.  30 tablet  0  . diltiazem (DILACOR XR) 180 MG 24 hr capsule TAKE ONE CAPSULE BY MOUTH EVERY DAY  30 capsule  2  . glimepiride (AMARYL) 1 MG tablet Take 1 tablet (1 mg total) by mouth daily before breakfast.  30 tablet  2  . glucose blood test strip Use as instructed  100 each  12  . hydrochlorothiazide 25 MG tablet Take 1 tablet (25 mg total) by mouth daily.  30 tablet  5  . HYDROcodone-acetaminophen (LORTAB) 7.5-500 MG per tablet Take 1 tablet by mouth 3 (three) times daily.  90 tablet  0  . losartan (COZAAR) 100 MG tablet Take 100 mg by mouth daily.        . metFORMIN (GLUCOPHAGE) 1000 MG tablet Take 1/2 tablet twice a day.  30 tablet  2  . omeprazole (PRILOSEC) 20 MG capsule Take 20 mg by mouth daily.        [name] PARoxetine (PAXIL) 30  MG tablet TAKE ONE TABLET BY MOUTH EVERY DAY  30 tablet  3  . sildenafil (VIAGRA) 100 MG tablet Take 1 tablet 30 minutes prior to sexual activity.         BP 120/74  Pulse 52  Temp(Src) 97.8 F (36.6 C) (Oral)  Resp 16  Wt 301 lb 1.9 oz (136.587 kg)       Objective:   Physical Exam    Gen.: Awake, alert, no acute distress Skin: Sites of previous skin lesions on his right cheek are well healed at this time. Slight scabs persist. No associated erythema.  Both skin lesions were approximately 2-32mm in diameter and were shaved with surgical blade.    Assessment & Plan:

## 2010-06-18 NOTE — Patient Instructions (Signed)
Call if the irritation on your buttock worsens of if it does not improve. Follow up in 3 months, sooner if problems or concerns.

## 2010-06-18 NOTE — Assessment & Plan Note (Signed)
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 I did offer to sit down with him personally and help him with his meter if he wishes to book a visit for teaching.

## 2010-07-01 ENCOUNTER — Other Ambulatory Visit: Payer: Self-pay | Admitting: Family

## 2010-07-01 ENCOUNTER — Ambulatory Visit (HOSPITAL_BASED_OUTPATIENT_CLINIC_OR_DEPARTMENT_OTHER)
Admission: RE | Admit: 2010-07-01 | Discharge: 2010-07-01 | Disposition: A | Discharge status: Home / Self Care | Payer: Medicare PPO | Source: Ambulatory Visit | Attending: Family | Admitting: Family

## 2010-07-01 ENCOUNTER — Encounter: Payer: Self-pay | Admitting: Family

## 2010-07-01 ENCOUNTER — Ambulatory Visit (INDEPENDENT_AMBULATORY_CARE_PROVIDER_SITE_OTHER): Payer: Medicare PPO | Admitting: Family

## 2010-07-01 VITALS — BP 124/84 | HR 60 | Temp 98.4°F | Resp 16 | Wt 300.0 lb

## 2010-07-01 DIAGNOSIS — I77810 Thoracic aortic ectasia: Secondary | ICD-10-CM

## 2010-07-01 DIAGNOSIS — W19XXXA Unspecified fall, initial encounter: Secondary | ICD-10-CM

## 2010-07-01 DIAGNOSIS — D485 Neoplasm of uncertain behavior of skin: Secondary | ICD-10-CM | POA: Insufficient documentation

## 2010-07-01 DIAGNOSIS — R079 Chest pain, unspecified: Secondary | ICD-10-CM

## 2010-07-01 DIAGNOSIS — R0789 Other chest pain: Secondary | ICD-10-CM | POA: Insufficient documentation

## 2010-07-01 MED ORDER — HYDROCODONE-ACETAMINOPHEN 7.5-500 MG PO TABS: 1.0000 | ORAL_TABLET | Freq: Three times a day (TID) | ORAL | Status: DC

## 2010-07-01 MED ORDER — DIAZEPAM 10 MG PO TABS: 10.0000 mg | ORAL_TABLET | Freq: Every day | ORAL | Status: DC

## 2010-07-01 NOTE — Progress Notes (Signed)
  Subjective:    Patient ID: Christian Morris, male    DOB: 12/01/1940, 70 y.o.   MRN: [id number]  HPI    Review of Systems     Objective:   Physical Exam        Assessment & Plan:

## 2010-07-01 NOTE — Patient Instructions (Signed)
Please complete your x-ray on the first floor.  We will contact you with the results.  Follow up in 2 months.

## 2010-07-01 NOTE — Progress Notes (Signed)
Subjective:    Patient ID: Christian Morris, male    DOB: Apr 03, 1940, 70 y.o.   MRN: [id number]  HPI  Christian Morris is a 70 yr old male who presents today following an accidental fall 2 weeks ago. He denies loss of consciousness during the fall. He states that he tripped and fell into some squash lines. Since that time he has had some soreness on his left anterior chest wall.  Denies shortness of breath.  Pain is worse at night, he has trouble laying on his left side. Pain is not worsened by deep inspiration.  Review of Systems See history of present illness  Past Medical History  Diagnosis Date  . Arthritis   . Depression   . Diabetes mellitus   . Hypertension   . Hyperlipidemia   . History of gastric ulcer   . Transfusion history     due to bleeding ulcer    History   Social History  . Marital Status: Divorced    Spouse Name: N/A    Number of Children: N/A  . Years of Education: N/A   Occupational History  . Not on file.   Social History Main Topics  . Smoking status: Former Smoker    Types: Cigarettes    Quit date: 01/05/1982  . Smokeless tobacco: Not on file  . Alcohol Use: No  . Drug Use: Not on file  . Sexually Active: Not on file   Other Topics Concern  . 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    Past Surgical History  Procedure Date  . Eye surgery 08/2009    cataract removal left eye--Dr Dione Booze    Family History  Problem Relation Age of Onset  . Arthritis Mother   . Heart disease Sister     MI  . Heart disease Brother     MI  . Heart disease Brother     MI    Allergies  Allergen Reactions  . Aspirin     REACTION: hx of ulcers    Current Outpatient Prescriptions on File Prior to Visit  Medication Sig Dispense Refill  . albuterol (PROAIR HFA) 108 (90 BASE) MCG/ACT inhaler Inhale 2 puffs into the lungs every 6 (six) hours as needed.       [name] aspirin 81 MG EC tablet  Take 81 mg by mouth daily.        [name] atorvastatin (LIPITOR) 40 MG tablet TAKE ONE TABLET BY MOUTH EVERY DAY  30 tablet  2  . diltiazem (DILACOR XR) 180 MG 24 hr capsule TAKE ONE CAPSULE BY MOUTH EVERY DAY  30 capsule  2  . glimepiride (AMARYL) 1 MG tablet Take 1 tablet (1 mg total) by mouth daily before breakfast.  30 tablet  2  . glucose blood test strip Use as instructed  100 each  12  . hydrochlorothiazide 25 MG tablet Take 1 tablet (25 mg total) by mouth daily.  30 tablet  5  . losartan (COZAAR) 100 MG tablet Take 100 mg by mouth daily.        . metFORMIN (GLUCOPHAGE) 1000 MG tablet Take 1/2 tablet twice a day.  30 tablet  2  . omeprazole (PRILOSEC) 20 MG capsule Take 20 mg by mouth daily.        [name] PARoxetine (PAXIL) 30 MG tablet TAKE ONE TABLET BY MOUTH EVERY DAY  30 tablet  3  .  DISCONTD: diazepam (VALIUM) 10 MG tablet Take 1 tablet (10 mg total) by mouth daily.  30 tablet  0  . DISCONTD: HYDROcodone-acetaminophen (LORTAB) 7.5-500 MG per tablet Take 1 tablet by mouth 3 (three) times daily.  90 tablet  0  . DISCONTD: sildenafil (VIAGRA) 100 MG tablet Take 1 tablet 30 minutes prior to sexual activity.         BP 124/84  Pulse 60  Temp(Src) 98.4 F (36.9 C) (Oral)  Resp 16  Wt 300 lb 0.6 oz (136.097 kg)  SpO2 96%       Objective:   Physical Exam General: Morbidly obese white male, awake, alert, and in no acute distress Cardiovascular: S1, S2, regular rate and rhythm Respiratory: Breath sounds are clear to auscultation bilaterally without wheezes rales or rhonchi, good air movement bilateral bases Musculoskeletal: Some tenderness to palpation of the left anterior chest wall.       Assessment & Plan:

## 2010-07-01 NOTE — Assessment & Plan Note (Signed)
70 year old male with accidental fall approximately 2 weeks ago. Since that time he has had some left anterior chest wall tenderness which bothers him the most at night when he's trying to sleep. Will check a chest x-ray left rib detail to exclude rib fracture.

## 2010-07-02 ENCOUNTER — Telehealth: Payer: Self-pay | Admitting: *Deleted

## 2010-07-02 NOTE — Telephone Encounter (Signed)
I called pt to notify him of his xray result and he reported that he fell again yesterday when he walked to his mail box. He states he has to cross a small wooden bridge and feels his fall was due to being too close to the edge. Pt states he was feeling fine prior to the fall and feels fine today. Thinks he just needs to be more careful. Advised pt to call us if he experiences any further falls.

## 2010-07-05 ENCOUNTER — Other Ambulatory Visit: Payer: Self-pay | Admitting: Family

## 2010-07-06 NOTE — Telephone Encounter (Signed)
OK to give #30 with 5 refills

## 2010-07-14 ENCOUNTER — Telehealth: Payer: Self-pay | Admitting: Family

## 2010-07-14 MED ORDER — METFORMIN HCL 1000 MG PO TABS: ORAL_TABLET | ORAL | Status: DC

## 2010-07-14 MED ORDER — LOSARTAN POTASSIUM 100 MG PO TABS: 100.0000 mg | ORAL_TABLET | Freq: Every day | ORAL | Status: DC

## 2010-07-14 NOTE — Telephone Encounter (Signed)
Refill sent to pharmacy and pt notified.  

## 2010-07-14 NOTE — Telephone Encounter (Signed)
Pt states he has two pills of losartan left. Please refill.

## 2010-07-14 NOTE — Telephone Encounter (Signed)
Refill- metformin 1000mg  tab. Take one-half tablet by mouth twice daily. Qty 30. Last fill 6.11.12

## 2010-07-29 ENCOUNTER — Other Ambulatory Visit: Payer: Self-pay | Admitting: Family

## 2010-07-30 ENCOUNTER — Other Ambulatory Visit: Payer: Self-pay | Admitting: Family

## 2010-07-30 NOTE — Telephone Encounter (Signed)
Pt last seen 07/01/10, has f/u 09/15/10. Meds last filled on 07/01/10.  Please advise.

## 2010-07-31 ENCOUNTER — Telehealth: Payer: Self-pay | Admitting: *Deleted

## 2010-07-31 MED ORDER — HYDROCORTISONE ACE-PRAMOXINE 1-1 % RE FOAM: 1.0000 | Freq: Two times a day (BID) | RECTAL | Status: AC

## 2010-07-31 NOTE — Telephone Encounter (Signed)
Rx sent to pharmacy   

## 2010-07-31 NOTE — Telephone Encounter (Signed)
Pt.notified

## 2010-07-31 NOTE — Telephone Encounter (Signed)
Received message from pt stating he is having problems with hemorrhoids. Was prescribed proctofoam HC 2.5 by a previous doctor in [place] and would like [place] to send RX to help with his current flare up. Please advise.

## 2010-08-01 ENCOUNTER — Telehealth: Payer: Self-pay | Admitting: *Deleted

## 2010-08-01 NOTE — Telephone Encounter (Signed)
I recommend OTC preparation H first.  Call us if this does not help.

## 2010-08-01 NOTE — Telephone Encounter (Signed)
Patient called requesting a different medication for his hemorrhoids be called to [facility]. He state the rx that was sent in cost him a $70 copay.

## 2010-08-01 NOTE — Telephone Encounter (Signed)
Call placed to patient at [phone], he was advised per Sandford Craze instruction. Patient states that he has tried the preparation H several years ago and it does not help. He was advised to make another attempt at use, he states that he will work something out and do not worry about sending another rx in for him. No further action needed.

## 2010-08-04 NOTE — Telephone Encounter (Signed)
Pls call pt and let him know that I have called refills in to his pharmacy.

## 2010-08-11 ENCOUNTER — Other Ambulatory Visit: Payer: Self-pay | Admitting: Family

## 2010-08-18 ENCOUNTER — Telehealth: Payer: Self-pay | Admitting: Family

## 2010-08-18 MED ORDER — ALBUTEROL SULFATE HFA 108 (90 BASE) MCG/ACT IN AERS: 2.0000 | INHALATION_SPRAY | Freq: Four times a day (QID) | RESPIRATORY_TRACT | Status: DC | PRN

## 2010-08-18 NOTE — Telephone Encounter (Signed)
Rx refill sent to pharmacy. 

## 2010-08-18 NOTE — Telephone Encounter (Signed)
Refill- proair hfa aer. Inhale two puffs every six hours as needed for cough/wheezing. Qty 9. Last fill 12.19.11

## 2010-08-27 ENCOUNTER — Other Ambulatory Visit: Payer: Self-pay | Admitting: Family

## 2010-08-28 ENCOUNTER — Telehealth: Payer: Self-pay | Admitting: Family

## 2010-08-28 NOTE — Telephone Encounter (Signed)
Refills- proair hfa aer. Inhale two puffs every 6 hours as needed for cough/wheezing. Qty 9. Last fill 12.19.11

## 2010-08-28 NOTE — Telephone Encounter (Signed)
Refill left on pharmacy voicemail for Hydrocodone 1 tablet three times daily #90 x no refills and diazepam 1 tablet daily  #30 x no refills.

## 2010-08-29 NOTE — Telephone Encounter (Signed)
Proair refilled 08/18/10 18g x 1 refill to [facility]. Left message on pharmacy voicemail that they should have refill on file and to call us if any question.

## 2010-09-15 ENCOUNTER — Encounter: Payer: Self-pay | Admitting: Family

## 2010-09-15 ENCOUNTER — Ambulatory Visit (INDEPENDENT_AMBULATORY_CARE_PROVIDER_SITE_OTHER): Payer: Medicare PPO | Admitting: Family

## 2010-09-15 ENCOUNTER — Telehealth: Payer: Self-pay | Admitting: Family

## 2010-09-15 DIAGNOSIS — E119 Type 2 diabetes mellitus without complications: Secondary | ICD-10-CM

## 2010-09-15 DIAGNOSIS — F341 Dysthymic disorder: Secondary | ICD-10-CM

## 2010-09-15 DIAGNOSIS — I10 Essential (primary) hypertension: Secondary | ICD-10-CM

## 2010-09-15 DIAGNOSIS — K760 Fatty (change of) liver, not elsewhere classified: Secondary | ICD-10-CM

## 2010-09-15 DIAGNOSIS — E785 Hyperlipidemia, unspecified: Secondary | ICD-10-CM

## 2010-09-15 DIAGNOSIS — K7689 Other specified diseases of liver: Secondary | ICD-10-CM

## 2010-09-15 MED ORDER — METFORMIN HCL 1000 MG PO TABS: ORAL_TABLET | ORAL | Status: DC

## 2010-09-15 NOTE — Assessment & Plan Note (Signed)
Continue statin. Check FLP and LFT's.

## 2010-09-15 NOTE — Assessment & Plan Note (Signed)
Stable on paxil. Continue same.  

## 2010-09-15 NOTE — Patient Instructions (Signed)
Complete blood work on the floor.  Follow up in 3 months.

## 2010-09-15 NOTE — Telephone Encounter (Signed)
Spoke with pt.  He would like to return later this week for a flu shot and a Tdap.  He will call to arrange a nurse visit.

## 2010-09-15 NOTE — Assessment & Plan Note (Signed)
Stable on cozaar and HCTZ. Continue same, check BMET.

## 2010-09-15 NOTE — Assessment & Plan Note (Signed)
I had pt demonstrate use of glucometer in the room today.  He successfully tested his blood sugar.  Will check A1C.

## 2010-09-15 NOTE — Progress Notes (Signed)
Subjective:    Patient ID: Christian Morris, male    DOB: May 10, 1940, 70 y.o.   MRN: [id number]  HPI  Christian Morris is a 70 yr old male who presents today for follow up.  1) DM2- reports that he is not checking his blood sugars. He continues Metfromin and amaryl. Last a1c was 6.2 in March.  He notes that he had a few episodes where it "felt like a (low) was coming on" resolved with snack.    2) HTN- on HCTZ, and cozaar. Tolerating bp meds without difficulty.  Reports + compliance.  3)Anxiety/Depression- continues paroxetine and valium.  Reports that he hasn't been walking as much due to all of her car troubles but reports that his mood has been good.    4) hyperlipidemia- on atorvastatin- denies unusual myalgias.    Review of Systems  Respiratory: Negative for shortness of breath.   Cardiovascular: Negative for chest pain and leg swelling.   See HPI  Past Medical History  Diagnosis Date  . Arthritis   . Depression   . Diabetes mellitus   . Hypertension   . Hyperlipidemia   . History of gastric ulcer   . Transfusion history     due to bleeding ulcer    History   Social History  . Marital Status: Divorced    Spouse Name: N/A    Number of Children: N/A  . Years of Education: N/A   Occupational History  . Not on file.   Social History Main Topics  . Smoking status: Former Smoker    Types: Cigarettes    Quit date: 01/05/1982  . Smokeless tobacco: Not on file  . Alcohol Use: No  . Drug Use: Not on file  . Sexually Active: Not on file   Other Topics Concern  . 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    Past Surgical History  Procedure Date  . Eye surgery 08/2009    cataract removal left eye--Dr Dione Booze    Family History  Problem Relation Age of Onset  . Arthritis Mother   . Heart disease Sister     MI  . Heart disease Brother     MI  . Heart disease Brother     MI    Allergies    Allergen Reactions  . Aspirin     REACTION: hx of ulcers    Current Outpatient Prescriptions on File Prior to Visit  Medication Sig Dispense Refill  . albuterol (PROAIR HFA) 108 (90 BASE) MCG/ACT inhaler Inhale 2 puffs into the lungs every 6 (six) hours as needed.  18 g  1  . aspirin 81 MG EC tablet Take 81 mg by mouth daily.        [name] atorvastatin (LIPITOR) 40 MG tablet TAKE ONE TABLET BY MOUTH EVERY DAY  30 tablet  3  . diazepam (VALIUM) 10 MG tablet TAKE ONE TABLET BY MOUTH EVERY DAY  30 tablet  0  . diltiazem (DILACOR XR) 180 MG 24 hr capsule TAKE ONE CAPSULE BY MOUTH EVERY DAY  30 capsule  3  . glimepiride (AMARYL) 1 MG tablet TAKE ONE TABLET BY MOUTH EVERY DAY BEFORE BREAKFAST  30 tablet  1  . glucose blood test strip Use as instructed  100 each  12  . hydrochlorothiazide 25 MG tablet Take 1 tablet (25 mg total) by mouth daily.  30 tablet  5  .  HYDROcodone-acetaminophen (LORTAB) 7.5-500 MG per tablet TAKE ONE TABLET BY MOUTH THREE TIMES DAILY  90 tablet  0  . losartan (COZAAR) 100 MG tablet Take 1 tablet (100 mg total) by mouth daily.  30 tablet  2  . metFORMIN (GLUCOPHAGE) 1000 MG tablet Take 1/2 tablet twice a day.  30 tablet  2  . omeprazole (PRILOSEC) 20 MG capsule Take 20 mg by mouth daily.        [name] PARoxetine (PAXIL) 30 MG tablet TAKE ONE TABLET BY MOUTH EVERY DAY  30 tablet  2    BP 120/80  Pulse 60  Temp(Src) 97.7 F (36.5 C) (Oral)  Resp 16  Ht 5\' 10"  (1.778 m)  Wt 307 lb (139.254 kg)  BMI 44.05 kg/m2       Objective:   Physical Exam  Constitutional: He appears well-developed and well-nourished.  Eyes: No scleral icterus.  Cardiovascular: Normal rate, regular rhythm and normal heart sounds.   No murmur heard. Pulmonary/Chest: Effort normal and breath sounds normal. No respiratory distress. He has no wheezes. He has no rales. He exhibits no tenderness.  Musculoskeletal: He exhibits no edema.  Psychiatric: He has a normal mood and affect. His behavior is  normal. Judgment and thought content normal.          Assessment & Plan:

## 2010-09-16 ENCOUNTER — Telehealth: Payer: Self-pay | Admitting: Family

## 2010-09-16 ENCOUNTER — Ambulatory Visit (INDEPENDENT_AMBULATORY_CARE_PROVIDER_SITE_OTHER): Payer: Medicare PPO | Admitting: Family

## 2010-09-16 DIAGNOSIS — Z23 Encounter for immunization: Secondary | ICD-10-CM

## 2010-09-16 LAB — BASIC METABOLIC PANEL WITH GFR
BUN: 27 mg/dL — ABNORMAL HIGH (ref 6–23)
Calcium: 10.4 mg/dL (ref 8.4–10.5)
GFR, Est African American: >60 mL/min (ref >60)
GFR, Est Non African American: 59 mL/min — ABNORMAL LOW (ref >60)
Glucose, Bld: 111 mg/dL — ABNORMAL HIGH (ref 70–99)
Potassium: 4.6 mEq/L (ref 3.5–5.3)
Sodium: 137 mEq/L (ref 135–145)

## 2010-09-16 LAB — LIPID PANEL
HDL: 33 mg/dL — ABNORMAL LOW (ref >39)
LDL Cholesterol: 60 mg/dL (ref 0–99)
Triglycerides: 233 mg/dL — ABNORMAL HIGH (ref <150)
VLDL: 47 mg/dL — ABNORMAL HIGH (ref 0–40)

## 2010-09-16 LAB — HEPATIC FUNCTION PANEL
ALT: 19 U/L (ref 0–53)
Alkaline Phosphatase: 73 U/L (ref 39–117)
Bilirubin, Direct: 0.1 mg/dL (ref 0.0–0.3)
Indirect Bilirubin: 0.6 mg/dL (ref 0.0–0.9)
Total Protein: 7.9 g/dL (ref 6.0–8.3)

## 2010-09-16 LAB — HEMOGLOBIN A1C: Hgb A1c MFr Bld: 6.7 % — ABNORMAL HIGH (ref <5.7)

## 2010-09-16 NOTE — Telephone Encounter (Signed)
Please call pt and let him know that his sugar looks like it has been running a bit higher since last visit. I would like him to increase metformin to 1000mg  (full tab twice daily).  He was taking 1/2 bid prior.  Also, triglycerides are up.  Add fish oil 1000mg  caps 2 caps bid.  Work hard on diet, exercise and weight loss.

## 2010-09-16 NOTE — Telephone Encounter (Signed)
Left message on home phone for pt to return my call. Mobile number listed [phone]) is an incorrect number.

## 2010-09-16 NOTE — Telephone Encounter (Signed)
Pt.notified

## 2010-09-16 NOTE — Telephone Encounter (Signed)
Patient returned phone call. He no longer has mobile phone#

## 2010-09-25 ENCOUNTER — Telehealth: Payer: Self-pay | Admitting: Family

## 2010-09-25 NOTE — Telephone Encounter (Signed)
REFILL HYDROCO/ACETAMIN 7.5-500 MG TAB TAKE 1 BY MOUTH 3 TIMES DAILY LAST FILL 08-28-2010

## 2010-09-25 NOTE — Telephone Encounter (Signed)
See above

## 2010-09-25 NOTE — Telephone Encounter (Signed)
REFILL DIAZEPAM 10 MG TAB QTY 30 TAKE 1 TABLET BY MOUTH EVERY DAY LAST FILL 08-28-2010

## 2010-09-25 NOTE — Telephone Encounter (Signed)
Ok to fill 60 no refills

## 2010-09-26 NOTE — Telephone Encounter (Signed)
Per verbal from [place], ok to give #90 of hydrocodone as pt takes it three times daily. Called back and spoke to pharmacist. Pt has already picked up rx #60. Will correct quantity at next refill.

## 2010-09-26 NOTE — Telephone Encounter (Signed)
Pls send refill

## 2010-09-30 ENCOUNTER — Telehealth: Payer: Self-pay | Admitting: Family

## 2010-09-30 MED ORDER — METFORMIN HCL 1000 MG PO TABS: 1000.0000 mg | ORAL_TABLET | Freq: Two times a day (BID) | ORAL | Status: DC

## 2010-09-30 NOTE — Telephone Encounter (Signed)
Rx was sent to pharmacy on 09/16/10 for 1000mg  1 tablet twice a day. Spoke to pharmacist and she states they did not receive Rx on 09/16/10 with current directions. Rx re-sent.

## 2010-09-30 NOTE — Telephone Encounter (Signed)
Metformin 1000mg  tablet. Take 1/2 tablet twice a day. Qty 30.  Pharmacy comments: patient said for dose to be T BID, please clarify.

## 2010-10-03 ENCOUNTER — Other Ambulatory Visit: Payer: Self-pay | Admitting: Family

## 2010-10-13 ENCOUNTER — Telehealth: Payer: Self-pay | Admitting: Family

## 2010-10-13 MED ORDER — DIAZEPAM 10 MG PO TABS: 10.0000 mg | ORAL_TABLET | Freq: Every day | ORAL | Status: DC | PRN

## 2010-10-13 NOTE — Telephone Encounter (Signed)
Refill left on pharmacy voicemail. 

## 2010-10-13 NOTE — Telephone Encounter (Signed)
Refill-diazepam 10mg  tab. Take one tablet by mouth every day. Qty 30 last fill 9.21.12

## 2010-10-24 ENCOUNTER — Telehealth: Payer: Self-pay | Admitting: Family

## 2010-10-24 MED ORDER — HYDROCODONE-ACETAMINOPHEN 7.5-500 MG PO TABS: 1.0000 | ORAL_TABLET | Freq: Three times a day (TID) | ORAL | Status: DC

## 2010-10-24 NOTE — Telephone Encounter (Signed)
Refill completed.

## 2010-10-24 NOTE — Telephone Encounter (Signed)
Refill- hydroco/acetamin 7.5-500mg  tab. Take one tablet by mouth three times daily.qty 90. Last fill 9.21.12

## 2010-10-29 ENCOUNTER — Telehealth: Payer: Self-pay | Admitting: Family

## 2010-10-29 NOTE — Telephone Encounter (Signed)
Attempted to reach pharmacist but call kept routing to voicemail or cutting off completely. Left refill on voicemail per 10/24/10 documentation and notified pt.

## 2010-10-29 NOTE — Telephone Encounter (Signed)
Patient states that he called [facility] regarding his [facility]ab [facility]. [facility] that [facility] never received refill from 10-24-10

## 2010-11-03 ENCOUNTER — Telehealth: Payer: Self-pay | Admitting: Family

## 2010-11-03 MED ORDER — DILTIAZEM HCL ER 180 MG PO CP24: 180.0000 mg | ORAL_CAPSULE | Freq: Every day | ORAL | Status: DC

## 2010-11-03 NOTE — Telephone Encounter (Signed)
Refill sent to pharmacy #30 x 2 refills. 

## 2010-11-03 NOTE — Telephone Encounter (Signed)
REFILL DILTIAZEM 180 MG QTY 30 1 BY MOUTH DAILY LAST FILL 10-04-2010

## 2010-11-09 ENCOUNTER — Other Ambulatory Visit: Payer: Self-pay | Admitting: Family

## 2010-11-19 ENCOUNTER — Telehealth: Payer: Self-pay | Admitting: Family

## 2010-11-19 MED ORDER — DIAZEPAM 10 MG PO TABS: 10.0000 mg | ORAL_TABLET | Freq: Every day | ORAL | Status: DC | PRN

## 2010-11-19 NOTE — Telephone Encounter (Signed)
Refill diazepam 10 mg tab last fill 10-24-2010

## 2010-11-19 NOTE — Telephone Encounter (Signed)
Refill left on pharmacy voicemail #30 x no refills. Pt notified.

## 2010-11-24 ENCOUNTER — Telehealth: Payer: Self-pay | Admitting: *Deleted

## 2010-11-24 MED ORDER — HYDROCODONE-ACETAMINOPHEN 7.5-500 MG PO TABS: 1.0000 | ORAL_TABLET | Freq: Three times a day (TID) | ORAL | Status: DC

## 2010-11-24 NOTE — Telephone Encounter (Signed)
Received call from pt requesting refill on Lortab. Rx called to [facility] voicemail #90 x no refills.

## 2010-12-01 ENCOUNTER — Telehealth: Payer: Self-pay | Admitting: *Deleted

## 2010-12-01 NOTE — Telephone Encounter (Signed)
I agree with instructions as below.

## 2010-12-01 NOTE — Telephone Encounter (Signed)
Received call from pt stating he mixed his medication up this morning and took 3 metformin pills (thought he was taking metformin and pain meds but took three metformin. Pt denies hypoglycemic symptoms, currently not at home with his glucometer and is not able to check glucose at this time. Advised pt to monitor blood sugar today and not to take evening dose of metformin.

## 2010-12-16 ENCOUNTER — Encounter: Payer: Self-pay | Admitting: Family

## 2010-12-16 ENCOUNTER — Ambulatory Visit (INDEPENDENT_AMBULATORY_CARE_PROVIDER_SITE_OTHER): Payer: Medicare PPO | Admitting: Family

## 2010-12-16 ENCOUNTER — Telehealth: Payer: Self-pay | Admitting: Family

## 2010-12-16 ENCOUNTER — Ambulatory Visit (HOSPITAL_BASED_OUTPATIENT_CLINIC_OR_DEPARTMENT_OTHER)
Admission: RE | Admit: 2010-12-16 | Discharge: 2010-12-16 | Disposition: A | Discharge status: Home / Self Care | Payer: Medicare PPO | Source: Ambulatory Visit | Attending: Family | Admitting: Family

## 2010-12-16 DIAGNOSIS — M722 Plantar fascial fibromatosis: Secondary | ICD-10-CM

## 2010-12-16 DIAGNOSIS — E119 Type 2 diabetes mellitus without complications: Secondary | ICD-10-CM

## 2010-12-16 DIAGNOSIS — G4733 Obstructive sleep apnea (adult) (pediatric): Secondary | ICD-10-CM

## 2010-12-16 DIAGNOSIS — R0602 Shortness of breath: Secondary | ICD-10-CM | POA: Insufficient documentation

## 2010-12-16 DIAGNOSIS — I517 Cardiomegaly: Secondary | ICD-10-CM | POA: Insufficient documentation

## 2010-12-16 LAB — BASIC METABOLIC PANEL
CO2: 27 mEq/L (ref 19–32)
Glucose, Bld: 98 mg/dL (ref 70–99)
Potassium: 4.4 mEq/L (ref 3.5–5.3)
Sodium: 138 mEq/L (ref 135–145)

## 2010-12-16 MED ORDER — HYDROCODONE-ACETAMINOPHEN 7.5-500 MG PO TABS: 1.0000 | ORAL_TABLET | Freq: Three times a day (TID) | ORAL | Status: DC

## 2010-12-16 MED ORDER — DIAZEPAM 10 MG PO TABS: 10.0000 mg | ORAL_TABLET | Freq: Every day | ORAL | Status: DC | PRN

## 2010-12-16 NOTE — Assessment & Plan Note (Signed)
He continues CPAP.

## 2010-12-16 NOTE — Progress Notes (Signed)
  Subjective:    Patient ID: Christian Morris, male    DOB: 17-Oct-1940, 70 y.o.   MRN: [id number]  HPI    Review of Systems     Objective:   Physical Exam        Assessment & Plan:

## 2010-12-16 NOTE — Patient Instructions (Signed)
Please complete your chest x-ray on the first floor today. Complete lab work prior to leaving. Lab is on this floor now. Please schedule an appointment for mole removal in 1 month.

## 2010-12-16 NOTE — Assessment & Plan Note (Signed)
Clinically stable. Obtain A1C. He is up to date on microalbumin and flu shot.

## 2010-12-16 NOTE — Assessment & Plan Note (Addendum)
EKG performed and personally reviewed. It shows sinus bradycardia without any ischemic changes noted.  His CXR does not show any pulmonary edema, though he does have some lower extremity edema on exam. He has multiple risk factors for CAD however. Will refer for stress test.

## 2010-12-16 NOTE — Assessment & Plan Note (Signed)
Improved

## 2010-12-16 NOTE — Telephone Encounter (Signed)
Reviewed x-ray results with pt and plans to refer for a stress test. He is agreeable.

## 2010-12-16 NOTE — Progress Notes (Signed)
Subjective:    Patient ID: Christian Morris, male    DOB: [birth year], 70 y.o.   MRN: [id number]  HPI  [name] is a 70 yr old male here for follow up.  1) DM2- reports that his sugars are 120-150- though not checking regularly.   2) Reports "short winded"-  He reports that he was waling 5 miles a day.  He quit due to right foot pain.  He now reports that he has shortness of breath with exertion.  He reports that he has been able to to use the elliptical without problems.  He denies chest pain or LE edema.    3) R foot pain-  Reports that this is improving.    4) OSA- using CPAP   Review of Systems See HPI  Past Medical History  Diagnosis Date  . Arthritis   . Depression   . Diabetes mellitus   . Hypertension   . Hyperlipidemia   . History of gastric ulcer   . Transfusion history     due to bleeding ulcer    History   Social History  . Marital Status: Divorced    Spouse Name: N/A    Number of Children: N/A  . Years of Education: N/A   Occupational History  . Not on file.   Social History Main Topics  . Smoking status: Former Smoker    Types: Cigarettes    Quit date: 01/05/1982  . Smokeless tobacco: Not on file  . Alcohol Use: No  . Drug Use: Not on file  . Sexually Active: Not on file   Other Topics Concern  . 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    Past Surgical History  Procedure Date  . Eye surgery 08/2009    cataract removal left eye--Dr Dione Booze    Family History  Problem Relation Age of Onset  . Arthritis Mother   . Heart disease Sister     MI  . Heart disease Brother     MI  . Heart disease Brother     MI    Allergies  Allergen Reactions  . Aspirin     REACTION: hx of ulcers    Current Outpatient Prescriptions on File Prior to Visit  Medication Sig Dispense Refill  . albuterol (PROAIR HFA) 108 (90 BASE) MCG/ACT inhaler Inhale 2 puffs into the lungs  every 6 (six) hours as needed.  18 g  1  . aspirin 81 MG EC tablet Take 81 mg by mouth daily.        [name] atorvastatin (LIPITOR) 40 MG tablet TAKE ONE TABLET BY MOUTH EVERY DAY  30 tablet  3  . diltiazem (DILACOR XR) 180 MG 24 hr capsule Take 1 capsule (180 mg total) by mouth daily.  30 capsule  2  . fish oil-omega-3 fatty acids 1000 MG capsule Take 2 g by mouth 2 (two) times daily.        [name] glimepiride (AMARYL) 1 MG tablet TAKE ONE TABLET BY MOUTH EVERY DAY BEFORE BREAKFAST  30 tablet  3  . glucose blood test strip Use as instructed  100 each  12  . hydrochlorothiazide (HYDRODIURIL) 25 MG tablet TAKE ONE TABLET BY MOUTH EVERY DAY  30 tablet  0  . losartan (COZAAR) 100 MG tablet TAKE ONE TABLET BY MOUTH EVERY DAY  30 tablet  3  . metFORMIN (GLUCOPHAGE) 1000 MG tablet  Take 1 tablet (1,000 mg total) by mouth 2 (two) times daily with a meal.  60 tablet  2  . omeprazole (PRILOSEC) 20 MG capsule Take 20 mg by mouth daily.        [name] PARoxetine (PAXIL) 30 MG tablet TAKE ONE TABLET BY MOUTH EVERY DAY  30 tablet  3    BP 130/84  Pulse 78  Temp(Src) 97.4 F (36.3 C) (Oral)  Resp 18  Ht 5\' 10"  (1.778 m)  Wt 303 lb 1.9 oz (137.494 kg)  BMI 43.49 kg/m2       Objective:   Physical Exam  Constitutional: He appears well-developed and well-nourished. No distress.  Cardiovascular: Normal rate and regular rhythm.   No murmur heard. Pulmonary/Chest: Effort normal and breath sounds normal. No respiratory distress. He has no wheezes. He has no rales. He exhibits no tenderness.  Musculoskeletal:       1+ bilateral LE edema.  Skin: Skin is warm and dry. No rash noted. No erythema. No pallor.       Dry raised lesion noted on left forearm  Psychiatric: He has a normal mood and affect. His behavior is normal. Judgment and thought content normal.          Assessment & Plan:

## 2010-12-17 ENCOUNTER — Encounter: Payer: Self-pay | Admitting: Family

## 2010-12-19 ENCOUNTER — Encounter: Payer: Self-pay | Admitting: *Deleted

## 2010-12-23 ENCOUNTER — Telehealth: Payer: Self-pay | Admitting: *Deleted

## 2010-12-23 NOTE — Telephone Encounter (Signed)
Pt reports increasing pain in lower left side radiating to his back x 1 week. States pain was intense yesterday. Denies any other symptoms. Appt. Scheduled for tomorrow at 1:15 with Sandford Craze, NP.

## 2010-12-24 ENCOUNTER — Ambulatory Visit (HOSPITAL_BASED_OUTPATIENT_CLINIC_OR_DEPARTMENT_OTHER)
Admission: RE | Admit: 2010-12-24 | Discharge: 2010-12-24 | Disposition: A | Discharge status: Home / Self Care | Payer: Medicare PPO | Source: Ambulatory Visit | Attending: Family | Admitting: Family

## 2010-12-24 ENCOUNTER — Encounter: Payer: Self-pay | Admitting: Family

## 2010-12-24 ENCOUNTER — Telehealth: Payer: Self-pay | Admitting: Family

## 2010-12-24 ENCOUNTER — Ambulatory Visit (INDEPENDENT_AMBULATORY_CARE_PROVIDER_SITE_OTHER): Payer: Medicare PPO | Admitting: Family

## 2010-12-24 VITALS — BP 126/82 | HR 68 | Resp 16 | Wt 296.0 lb

## 2010-12-24 DIAGNOSIS — R3129 Other microscopic hematuria: Secondary | ICD-10-CM | POA: Insufficient documentation

## 2010-12-24 DIAGNOSIS — N200 Calculus of kidney: Secondary | ICD-10-CM

## 2010-12-24 DIAGNOSIS — I2510 Atherosclerotic heart disease of native coronary artery without angina pectoris: Secondary | ICD-10-CM | POA: Insufficient documentation

## 2010-12-24 DIAGNOSIS — R109 Unspecified abdominal pain: Secondary | ICD-10-CM

## 2010-12-24 LAB — POCT URINALYSIS DIPSTICK
Bilirubin, UA: NEGATIVE
Nitrite, UA: NEGATIVE
pH, UA: 7

## 2010-12-24 NOTE — Telephone Encounter (Signed)
Pt notified and is already aware of urology referral on 12/26/10 with Dr Brunilda Payor.

## 2010-12-24 NOTE — Telephone Encounter (Signed)
Please call pt and let him know that I reviewed his CT. It does show a kidney stone. I would like for him to see a urologist and I will make a referral. He should continue his lortabs for pain in the meantime.

## 2010-12-24 NOTE — Progress Notes (Signed)
Subjective:    Patient ID: Christian Morris, male    DOB: 04-May-1940, 69 y.o.   MRN: [id number]  HPI  [name] is a 70 yr old male who presents today with chief complaint of left flank pain.  Symptoms started several weeks ago.  Pain waxes and wanes, but never completely resolves.  Denies associated dysuria, hematuria, fever.  He continues on his lortabs.    Review of Systems See HPI  Past Medical History  Diagnosis Date  . Arthritis   . Depression   . Diabetes mellitus   . Hypertension   . Hyperlipidemia   . History of gastric ulcer   . Transfusion history     due to bleeding ulcer    History   Social History  . Marital Status: Divorced    Spouse Name: N/A    Number of Children: N/A  . Years of Education: N/A   Occupational History  . Not on file.   Social History Main Topics  . Smoking status: Former Smoker    Types: Cigarettes    Quit date: 01/05/1982  . Smokeless tobacco: Not on file  . Alcohol Use: No  . Drug Use: Not on file  . Sexually Active: Not on file   Other Topics Concern  . 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    Past Surgical History  Procedure Date  . Eye surgery 08/2009    cataract removal left eye--Dr Dione Booze    Family History  Problem Relation Age of Onset  . Arthritis Mother   . Heart disease Sister     MI  . Heart disease Brother     MI  . Heart disease Brother     MI    Allergies  Allergen Reactions  . Aspirin     REACTION: hx of ulcers    Current Outpatient Prescriptions on File Prior to Visit  Medication Sig Dispense Refill  . albuterol (PROAIR HFA) 108 (90 BASE) MCG/ACT inhaler Inhale 2 puffs into the lungs every 6 (six) hours as needed.  18 g  1  . aspirin 81 MG EC tablet Take 81 mg by mouth daily.        [name] atorvastatin (LIPITOR) 40 MG tablet TAKE ONE TABLET BY MOUTH EVERY DAY  30 tablet  3  . diazepam (VALIUM) 10 MG tablet Take 1  tablet (10 mg total) by mouth daily as needed for anxiety.  30 tablet  0  . diltiazem (DILACOR XR) 180 MG 24 hr capsule Take 1 capsule (180 mg total) by mouth daily.  30 capsule  2  . fish oil-omega-3 fatty acids 1000 MG capsule Take 2 g by mouth 2 (two) times daily.        [name] glimepiride (AMARYL) 1 MG tablet TAKE ONE TABLET BY MOUTH EVERY DAY BEFORE BREAKFAST  30 tablet  3  . glucose blood test strip Use as instructed  100 each  12  . hydrochlorothiazide (HYDRODIURIL) 25 MG tablet TAKE ONE TABLET BY MOUTH EVERY DAY  30 tablet  0  . HYDROcodone-acetaminophen (LORTAB) 7.5-500 MG per tablet Take 1 tablet by mouth 3 (three) times daily.  90 tablet  0  . losartan (COZAAR) 100 MG tablet TAKE ONE TABLET BY MOUTH EVERY DAY  30 tablet  3  . metFORMIN (GLUCOPHAGE) 1000 MG tablet Take 1 tablet (1,000 mg total) by mouth 2 (two) times daily  with a meal.  60 tablet  2  . omeprazole (PRILOSEC) 20 MG capsule Take 20 mg by mouth daily.        [name] PARoxetine (PAXIL) 30 MG tablet TAKE ONE TABLET BY MOUTH EVERY DAY  30 tablet  3    BP 126/82  Pulse 68  Resp 16  Wt 296 lb (134.265 kg)  SpO2 95%       Objective:   Physical Exam  Constitutional: He appears well-developed and well-nourished. No distress.  HENT:  Head: Normocephalic and atraumatic.  Right Ear: Tympanic membrane and ear canal normal.  Left Ear: Tympanic membrane and ear canal normal.  Cardiovascular: Normal rate and regular rhythm.   No murmur heard. Pulmonary/Chest: Effort normal and breath sounds normal. No respiratory distress. He has no wheezes. He has no rales.  Genitourinary:       Neg CVAT bilaterally.    Musculoskeletal:       Bilateral LE strength is 5/5   Psychiatric: He has a normal mood and affect. His behavior is normal. Thought content normal.          Assessment & Plan:

## 2010-12-24 NOTE — Patient Instructions (Signed)
Please complete your CT on the first floor.  We will contact you with your results.

## 2010-12-25 ENCOUNTER — Encounter (HOSPITAL_COMMUNITY): Payer: Self-pay

## 2010-12-25 ENCOUNTER — Encounter (HOSPITAL_COMMUNITY): Payer: Self-pay | Admitting: *Deleted

## 2010-12-25 ENCOUNTER — Encounter (HOSPITAL_COMMUNITY): Payer: Medicare PPO | Admitting: Radiology

## 2010-12-25 ENCOUNTER — Emergency Department (HOSPITAL_COMMUNITY)
Admission: EM | Admit: 2010-12-25 | Discharge: 2010-12-25 | Discharge status: Against Medical Advice | Payer: Medicare PPO | Source: Home / Self Care

## 2010-12-25 DIAGNOSIS — R51 Headache: Secondary | ICD-10-CM

## 2010-12-25 NOTE — ED Notes (Signed)
Pt refuses transfer to ED for further imaging of his head.  States he has another appt and can't afford another co-pay.  Risks explained to him again- pt still declines.  AMA form completed.

## 2010-12-25 NOTE — Progress Notes (Signed)
Patient ID: Christian Morris, male   DOB: January 04, 1941, 70 y.o.   MRN: [id number] Patient arrived at 7am for his 8:15 Myoview appointment. He c/o severe right- sided Headache, Pain 8/10. Denied any visual changes, dizziness, chest pain, no left or right- sided weakness. No rash noted. - BP 140/72. CPSS- negative, patient was alert and responsive. Patient advised to call PCP for advice, and suggested closest Urgent Care if pain worsens. Myoview cancelled until after evaluation of headache.  Bonnita Levan, RN

## 2010-12-25 NOTE — ED Notes (Signed)
Pt states that yesterday afternoon he began having sharp shooting pain to rt side of head.  States they occur approx every 5-10 minutes and he rates them 10/10.  Denies any n/v, weakness, speech or vision problems.  Reports he saw his PCP yesterday around 1:30 and he felt fine then.  Went for stress test this am and they sent him here.  Has not taken any analgesic since sx started.

## 2010-12-25 NOTE — ED Provider Notes (Signed)
History     CSN: [id number]  Arrival date & time 12/25/10  0820   None     Chief Complaint  Patient presents with  . Headache    (Consider location/radiation/quality/duration/timing/severity/associated sxs/prior treatment) HPI Comments: Onset of HA yesterday. Sharp shooting pains from Rt ear area up to top of head approx every 5 minutes. Pt denies head injury.  No visual changes, tinnitus, hearing change or N/V. No HS awakening from pain - states he slept well. No progression of frequency or severity.  He was scheduled for a stress test yesterday and they cancelled due to HA and was advised to come to Urgent Care for evaluation.   Patient is a 70 y.o. male presenting with headaches. The history is provided by the patient.  Headache The primary symptoms include headaches. Primary symptoms do not include syncope, loss of consciousness, altered mental status, dizziness, visual change, paresthesias, loss of sensation, speech change, memory loss, fever, nausea or vomiting. The symptoms began yesterday. The symptoms are unchanged. The neurological symptoms are focal.  The headache is not associated with photophobia, eye pain, visual change, neck stiffness, paresthesias, weakness or loss of balance.  Additional symptoms include hearing loss (chronic, unchanged). Additional symptoms do not include neck stiffness, weakness, lower back pain, leg pain, loss of balance, photophobia, tinnitus or vertigo.    Past Medical History  Diagnosis Date  . Arthritis   . Depression   . Diabetes mellitus   . Hypertension   . Hyperlipidemia   . History of gastric ulcer   . Transfusion history     due to bleeding ulcer    Past Surgical History  Procedure Date  . Eye surgery 08/2009    cataract removal left eye--Dr Dione Booze    Family History  Problem Relation Age of Onset  . Arthritis Mother   . Heart disease Sister     MI  . Heart disease Brother     MI  . Heart disease Brother     MI     History  Substance Use Topics  . Smoking status: Former Smoker    Types: Cigarettes    Quit date: 01/05/1982  . Smokeless tobacco: Not on file  . Alcohol Use: No      Review of Systems  Constitutional: Negative for fever, chills and appetite change.  HENT: Positive for hearing loss (chronic, unchanged). Negative for ear pain, congestion, sore throat, sneezing, neck pain, neck stiffness and tinnitus.   Eyes: Negative for photophobia, pain and visual disturbance.  Respiratory: Negative for cough and shortness of breath.   Cardiovascular: Negative for chest pain and syncope.  Gastrointestinal: Negative for nausea and vomiting.  Neurological: Positive for headaches. Negative for dizziness, vertigo, speech change, loss of consciousness, weakness, paresthesias and loss of balance.  Psychiatric/Behavioral: Negative for memory loss and altered mental status.    Allergies  Aspirin  Home Medications   Current Outpatient Rx  Name Route Sig Dispense Refill  . ALBUTEROL SULFATE HFA 108 (90 BASE) MCG/ACT IN AERS Inhalation Inhale 2 puffs into the lungs every 6 (six) hours as needed. 18 g 1  . ASPIRIN 81 MG PO TBEC Oral Take 81 mg by mouth daily.      . ATORVASTATIN CALCIUM 40 MG PO TABS  TAKE ONE TABLET BY MOUTH EVERY DAY 30 tablet 3  . DIAZEPAM 10 MG PO TABS Oral Take 1 tablet (10 mg total) by mouth daily as needed for anxiety. 30 tablet 0  . DILTIAZEM HCL ER 180  MG PO CP24 Oral Take 1 capsule (180 mg total) by mouth daily. 30 capsule 2  . OMEGA-3 FATTY ACIDS 1000 MG PO CAPS Oral Take 2 g by mouth 2 (two) times daily.      [name] GLIMEPIRIDE 1 MG PO TABS  TAKE ONE TABLET BY MOUTH EVERY DAY BEFORE BREAKFAST 30 tablet 3  . GLUCOSE BLOOD VI STRP  Use as instructed 100 each 12  . HYDROCHLOROTHIAZIDE 25 MG PO TABS  TAKE ONE TABLET BY MOUTH EVERY DAY 30 tablet 0  . HYDROCODONE-ACETAMINOPHEN 7.5-500 MG PO TABS Oral Take 1 tablet by mouth 3 (three) times daily. 90 tablet 0  . LOSARTAN POTASSIUM  100 MG PO TABS  TAKE ONE TABLET BY MOUTH EVERY DAY 30 tablet 3  . METFORMIN HCL 1000 MG PO TABS Oral Take 1 tablet (1,000 mg total) by mouth 2 (two) times daily with a meal. 60 tablet 2  . OMEPRAZOLE 20 MG PO CPDR Oral Take 20 mg by mouth daily.      [name] PAROXETINE HCL 30 MG PO TABS  TAKE ONE TABLET BY MOUTH EVERY DAY 30 tablet 3    BP 131/74  Pulse 60  Temp(Src) 98.1 F (36.7 C) (Oral)  Resp 16  SpO2 96%  Physical Exam  Nursing note and vitals reviewed. Constitutional: He appears well-developed and well-nourished. No distress.  HENT:  Head: Normocephalic and atraumatic.  Right Ear: Tympanic membrane, external ear and ear canal normal.  Left Ear: Tympanic membrane, external ear and ear canal normal.  Nose: Nose normal.  Mouth/Throat: Uvula is midline, oropharynx is clear and moist and mucous membranes are normal. No oropharyngeal exudate, posterior oropharyngeal edema or posterior oropharyngeal erythema.  Eyes: Conjunctivae, EOM and lids are normal. Pupils are equal, round, and reactive to light.  Neck: Neck supple.  Cardiovascular: Normal rate, regular rhythm and normal heart sounds.   Pulses:      Radial pulses are 2+ on the right side, and 2+ on the left side.       Temporal arteries nontender.   Pulmonary/Chest: Effort normal and breath sounds normal. No respiratory distress.  Lymphadenopathy:    He has no cervical adenopathy.  Neurological: He is alert. He has normal strength. No cranial nerve deficit. Gait normal.  Reflex Scores:      Bicep reflexes are 2+ on the right side and 2+ on the left side. Skin: Skin is warm and dry.  Psychiatric: He has a normal mood and affect.    ED Course  Procedures (including critical care time)  Labs Reviewed - No data to display Ct Abdomen Pelvis Wo Contrast  12/24/2010  *RADIOLOGY REPORT*  Clinical Data: Left flank pain, microscopic hematuria, rule out kidney stone  CT ABDOMEN AND PELVIS WITHOUT CONTRAST  Technique:  Multidetector CT  imaging of the abdomen and pelvis was performed following the standard protocol without intravenous contrast.  Comparison: None.  Findings: Sagittal images of the spine shows degenerative changes thoracolumbar spine.  Significant disc space flattening with vacuum disc phenomenon noted at L4-L5 and L5 S1 level.  Large anterior osteophytes noted at L4-L5 and L5 S1 level.  Atherosclerotic calcifications of the coronary arteries.  There is a pleural-based calcified granuloma or hamartoma in the right base posteriorly measures 1 cm.  Unenhanced liver is unremarkable.  Gallbladder is contracted without evidence of calcified gallstones.  Unenhanced pancreas, spleen and adrenal glands are unremarkable. There is  bilateral small lobulated renal contour.  Mild nonspecific perirenal stranding.  No hydronephrosis or  hydroureter.  There is nonobstructive calcified calculus in lower pole of the left kidney measures 2.6 mm.  No calcified ureteral calculi are noted bilaterally.  No aortic aneurysm.  Atherosclerotic calcifications are noted abdominal aorta and the iliac arteries.  There is no pericecal inflammation.  Normal appendix is clearly visualized  in axial image 74.  The urinary bladder is under distended grossly unremarkable.  A left lower pelvic phlebolith is noted.  Bilateral distal ureter is unremarkable.  Prostate gland measures 4.7 x 5 cm.  No pelvic ascites or adenopathy.  No inguinal adenopathy.  Coronal images shows mild degenerative changes bilateral hip joints. There is levoscoliosis of the lumbar spine. Bilateral pars defect is noted at L5 level.  IMPRESSION: 1.  Left nonobstructive nephrolithiasis.  No hydronephrosis or hydroureter. 2.  No calcified ureteral calculi. 3.  Bilateral lobulated renal contour. 4.  Degenerative changes lower lumbar spine.  Bilateral pars defect noted at L5 level. 5.  Normal appendix is clearly visualized.  Original Report Authenticated By: Natasha Mead, M.D.     1. Headache        MDM  Discussed with pt need to transfer to ED for imaging studies. Despite my encouraging him to reconsider and discussing risks, pt declined transfer stating multiple reasons including the additional copay, and another appt that he had this afternoon regarding a kidney stone. I advised pt that he may still make his appt regarding the kidney stone, and if not it could be rescheduled as it was not as serious at this time. Pt still refused transfer and signed AMA form.         Melody Comas, [place] 12/25/10 2054

## 2010-12-26 LAB — URINE CULTURE: Organism ID, Bacteria: NO GROWTH

## 2010-12-26 NOTE — ED Provider Notes (Signed)
Medical screening examination/treatment/procedure(s) were performed by non-physician practitioner and as supervising physician I was immediately available for consultation/collaboration.   [facility]; MD   Sharin Grave, MD 12/26/10 1153

## 2010-12-27 DIAGNOSIS — N200 Calculus of kidney: Secondary | ICD-10-CM | POA: Insufficient documentation

## 2010-12-27 NOTE — Assessment & Plan Note (Signed)
UA is + for blood.  Pt with left sided flank pain. CT abd notes L sided non-obstructing stone. Refer to Urology for further evaluation.

## 2011-01-03 ENCOUNTER — Other Ambulatory Visit: Payer: Self-pay | Admitting: Family

## 2011-01-05 ENCOUNTER — Telehealth: Payer: Self-pay | Admitting: Family

## 2011-01-05 NOTE — Telephone Encounter (Signed)
Refill sent to pharmacy #30 x 1 refill. 

## 2011-01-05 NOTE — Telephone Encounter (Signed)
Hydrochlorothiazide 25 mg [facility] [address] [place]

## 2011-01-06 ENCOUNTER — Telehealth: Payer: Self-pay | Admitting: Family

## 2011-01-06 NOTE — Telephone Encounter (Signed)
Pls call pt and arrange a follow up visit to discuss his headaches.

## 2011-01-07 NOTE — Telephone Encounter (Signed)
Spoke to pt, he states he had a knot on the right side of his head that has resolved. Continues to have ear pain. Scheduled pt appt for 01/09/11 at 2:15.

## 2011-01-09 ENCOUNTER — Ambulatory Visit (INDEPENDENT_AMBULATORY_CARE_PROVIDER_SITE_OTHER): Payer: Medicare Other | Admitting: Family

## 2011-01-09 ENCOUNTER — Encounter: Payer: Self-pay | Admitting: Family

## 2011-01-09 ENCOUNTER — Telehealth: Payer: Self-pay | Admitting: Family

## 2011-01-09 VITALS — BP 118/76 | HR 60 | Temp 97.9°F | Resp 16 | Ht 70.0 in | Wt 306.0 lb

## 2011-01-09 DIAGNOSIS — R51 Headache: Secondary | ICD-10-CM

## 2011-01-09 DIAGNOSIS — R519 Headache, unspecified: Secondary | ICD-10-CM | POA: Insufficient documentation

## 2011-01-09 DIAGNOSIS — N529 Male erectile dysfunction, unspecified: Secondary | ICD-10-CM

## 2011-01-09 NOTE — Patient Instructions (Signed)
You will be contacted about your stress test.   Follow up in 2 months.

## 2011-01-09 NOTE — Progress Notes (Signed)
Subjective:    Patient ID: Christian Morris, male    DOB: 25-Mar-1940, 71 y.o.   MRN: [id number]  HPI  [name] is a 71 yr old male who presents today for follow up of HA.  He reports that he had a tender spot on his head with some associated ear pain (right). Put sweet oil in the ear which resolved his pain.  He continues to have mild intermittent headache which he attributes to sinus and allergies. He did not complete his stress test.      Review of Systems See HPI  Past Medical History  Diagnosis Date  . Arthritis   . Depression   . Diabetes mellitus   . Hypertension   . Hyperlipidemia   . History of gastric ulcer   . Transfusion history     due to bleeding ulcer    History   Social History  . Marital Status: Divorced    Spouse Name: N/A    Number of Children: N/A  . Years of Education: N/A   Occupational History  . Not on file.   Social History Main Topics  . Smoking status: Former Smoker    Types: Cigarettes    Quit date: 01/05/1982  . Smokeless tobacco: Not on file  . Alcohol Use: No  . Drug Use: No  . Sexually Active: Not on file   Other Topics Concern  . 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    Past Surgical History  Procedure Date  . Eye surgery 08/2009    cataract removal left eye--Dr Dione Booze    Family History  Problem Relation Age of Onset  . Arthritis Mother   . Heart disease Sister     MI  . Heart disease Brother     MI  . Heart disease Brother     MI    Allergies  Allergen Reactions  . Aspirin     REACTION: hx of ulcers    Current Outpatient Prescriptions on File Prior to Visit  Medication Sig Dispense Refill  . albuterol (PROAIR HFA) 108 (90 BASE) MCG/ACT inhaler Inhale 2 puffs into the lungs every 6 (six) hours as needed.  18 g  1  . aspirin 81 MG EC tablet Take 81 mg by mouth daily.        [name] atorvastatin (LIPITOR) 40 MG tablet TAKE ONE TABLET BY  MOUTH EVERY DAY  30 tablet  3  . diazepam (VALIUM) 10 MG tablet Take 1 tablet (10 mg total) by mouth daily as needed for anxiety.  30 tablet  0  . diltiazem (DILACOR XR) 180 MG 24 hr capsule Take 1 capsule (180 mg total) by mouth daily.  30 capsule  2  . fish oil-omega-3 fatty acids 1000 MG capsule Take 2 g by mouth 2 (two) times daily.        [name] glimepiride (AMARYL) 1 MG tablet TAKE ONE TABLET BY MOUTH EVERY DAY BEFORE BREAKFAST  30 tablet  3  . glucose blood test strip Use as instructed  100 each  12  . hydrochlorothiazide (HYDRODIURIL) 25 MG tablet TAKE ONE TABLET BY MOUTH EVERY DAY  30 tablet  1  . HYDROcodone-acetaminophen (LORTAB) 7.5-500 MG per tablet Take 1 tablet by mouth 3 (three) times daily.  90 tablet  0  . losartan (COZAAR) 100 MG tablet TAKE ONE TABLET BY MOUTH EVERY DAY  30 tablet  3  . metFORMIN (GLUCOPHAGE) 1000 MG tablet Take 1 tablet (1,000 mg total) by mouth 2 (two) times daily with a meal.  60 tablet  2  . omeprazole (PRILOSEC) 20 MG capsule Take 20 mg by mouth daily.        [name] PARoxetine (PAXIL) 30 MG tablet TAKE ONE TABLET BY MOUTH EVERY DAY  30 tablet  3    BP 118/76  Pulse 60  Temp(Src) 97.9 F (36.6 C) (Oral)  Resp 16  Ht 5\' 10"  (1.778 m)  Wt 306 lb (138.801 kg)  BMI 43.91 kg/m2       Objective:   Physical Exam  Constitutional: He is oriented to person, place, and time. He appears well-developed and well-nourished. No distress.  HENT:  Right Ear: Tympanic membrane and ear canal normal.  Left Ear: Tympanic membrane and ear canal normal.  Cardiovascular: Normal rate and regular rhythm.   No murmur heard. Pulmonary/Chest: Effort normal and breath sounds normal. No respiratory distress. He has no wheezes. He has no rales.  Neurological: He is alert and oriented to person, place, and time. No cranial nerve deficit. He exhibits normal muscle tone.  Psychiatric: He has a normal mood and affect. His behavior is normal. Judgment and thought content normal.           Assessment & Plan:

## 2011-01-09 NOTE — Assessment & Plan Note (Signed)
Headache is resolved, ? Migraine. Will monitor.  OK to proceed with previously scheduled stress test.

## 2011-01-09 NOTE — Telephone Encounter (Signed)
Christian Morris, could you pls call cardiology and have them reschedule his stress test. They cancelled due to headache.  I have evaluated pt and he is cleared to proceed with stress test. thanks

## 2011-01-11 ENCOUNTER — Encounter: Payer: Self-pay | Admitting: Family

## 2011-01-11 ENCOUNTER — Telehealth: Payer: Self-pay | Admitting: Family

## 2011-01-11 DIAGNOSIS — N529 Male erectile dysfunction, unspecified: Secondary | ICD-10-CM

## 2011-01-11 DIAGNOSIS — E291 Testicular hypofunction: Secondary | ICD-10-CM

## 2011-01-11 HISTORY — DX: Testicular hypofunction: E29.1

## 2011-01-12 LAB — TESTOSTERONE, FREE, TOTAL, SHBG
Testosterone, Free: 54.2 pg/mL (ref 47.0–244.0)
Testosterone-% Free: 2.3 % (ref 1.6–2.9)

## 2011-01-12 NOTE — Telephone Encounter (Signed)
Pt notified and will await referral info.

## 2011-01-12 NOTE — Telephone Encounter (Signed)
Per Sandford Craze, NP she will arrange f/u for pt with Dr Brunilda Payor to evaluate and treat both his low testosterone and ED.

## 2011-01-16 ENCOUNTER — Encounter: Payer: Self-pay | Admitting: Family

## 2011-01-16 ENCOUNTER — Telehealth: Payer: Self-pay | Admitting: Family

## 2011-01-16 ENCOUNTER — Ambulatory Visit (INDEPENDENT_AMBULATORY_CARE_PROVIDER_SITE_OTHER): Payer: Medicare Other | Admitting: Family

## 2011-01-16 VITALS — BP 140/80 | HR 60 | Temp 97.9°F | Resp 18 | Ht 70.0 in | Wt 306.0 lb

## 2011-01-16 DIAGNOSIS — L989 Disorder of the skin and subcutaneous tissue, unspecified: Secondary | ICD-10-CM

## 2011-01-16 DIAGNOSIS — D485 Neoplasm of uncertain behavior of skin: Secondary | ICD-10-CM

## 2011-01-16 DIAGNOSIS — E291 Testicular hypofunction: Secondary | ICD-10-CM

## 2011-01-16 DIAGNOSIS — L909 Atrophic disorder of skin, unspecified: Secondary | ICD-10-CM

## 2011-01-16 MED ORDER — HYDROCODONE-ACETAMINOPHEN 7.5-500 MG PO TABS: 1.0000 | ORAL_TABLET | Freq: Three times a day (TID) | ORAL | Status: DC

## 2011-01-16 MED ORDER — DIAZEPAM 10 MG PO TABS: 10.0000 mg | ORAL_TABLET | Freq: Every day | ORAL | Status: DC | PRN

## 2011-01-16 NOTE — Telephone Encounter (Signed)
Message copied by Sandford Craze on Fri Jan 16, 2011 12:02 PM ------      Message from: Eulah Pont      Created: Fri Jan 16, 2011 11:42 AM           Rescheduled   Jan   22  @  11:45   Patient notified                 ----- Message -----         From: Katrinka Blazing. [name], NP         Sent: 01/15/2011   9:04 PM           To: Vilinda Flake, could you pls call cardiology and have them reschedule his stress test. They cancelled due to headache.  I have evaluated pt and he is cleared to proceed with stress test. thanks

## 2011-01-16 NOTE — Patient Instructions (Signed)
Keep area dry for 24 hours.  Call if redness, swelling occurs at site.  We will contact you with you results in 1-2 weeks.

## 2011-01-16 NOTE — Progress Notes (Signed)
  Subjective:    Patient ID: Christian Morris, male    DOB: 1940-12-28, 71 y.o.   MRN: [id number]  HPI    Review of Systems     Objective:   Physical Exam Skin lesion of left forearm is approximately 0.5cm in diameter.        Assessment & Plan:

## 2011-01-16 NOTE — Progress Notes (Signed)
Subjective:    Patient ID: Christian Morris, male    DOB: 1940/02/25, 71 y.o.   MRN: [id number]  HPI  Mr.  Goyer is a 71 yr old male who presents today for removal of a skin lesion on his left forearm.  This lesion has been present for some time and he is concerned that it could be cancerous. He is also concerned about a lesion at the base of the left cheek- he would like this lesion removed as well.   Hypogonadism- he is scheduled to see Urology for further management of his hypogonadism and ED.   Review of Systems See HPI  Past Medical History  Diagnosis Date  . Arthritis   . Depression   . Diabetes mellitus   . Hypertension   . Hyperlipidemia   . History of gastric ulcer   . Transfusion history     due to bleeding ulcer  . Hypogonadism male 01/11/2011    History   Social History  . Marital Status: Divorced    Spouse Name: N/A    Number of Children: N/A  . Years of Education: N/A   Occupational History  . Not on file.   Social History Main Topics  . Smoking status: Former Smoker    Types: Cigarettes    Quit date: 01/05/1982  . Smokeless tobacco: Not on file  . Alcohol Use: No  . Drug Use: No  . Sexually Active: Not on file   Other Topics Concern  . 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    Past Surgical History  Procedure Date  . Eye surgery 08/2009    cataract removal left eye--Dr Dione Booze    Family History  Problem Relation Age of Onset  . Arthritis Mother   . Heart disease Sister     MI  . Heart disease Brother     MI  . Heart disease Brother     MI    Allergies  Allergen Reactions  . Aspirin     REACTION: hx of ulcers    Current Outpatient Prescriptions on File Prior to Visit  Medication Sig Dispense Refill  . albuterol (PROAIR HFA) 108 (90 BASE) MCG/ACT inhaler Inhale 2 puffs into the lungs every 6 (six) hours as needed.  18 g  1  . aspirin 81 MG EC tablet  Take 81 mg by mouth daily.        [name] atorvastatin (LIPITOR) 40 MG tablet TAKE ONE TABLET BY MOUTH EVERY DAY  30 tablet  3  . diazepam (VALIUM) 10 MG tablet Take 1 tablet (10 mg total) by mouth daily as needed for anxiety.  30 tablet  0  . diltiazem (DILACOR XR) 180 MG 24 hr capsule Take 1 capsule (180 mg total) by mouth daily.  30 capsule  2  . fish oil-omega-3 fatty acids 1000 MG capsule Take 2 g by mouth 2 (two) times daily.        [name] glimepiride (AMARYL) 1 MG tablet TAKE ONE TABLET BY MOUTH EVERY DAY BEFORE BREAKFAST  30 tablet  3  . glucose blood test strip Use as instructed  100 each  12  . hydrochlorothiazide (HYDRODIURIL) 25 MG tablet TAKE ONE TABLET BY MOUTH EVERY DAY  30 tablet  1  . HYDROcodone-acetaminophen (LORTAB) 7.5-500 MG per tablet Take 1 tablet by mouth 3 (three) times daily.  90 tablet  0  . losartan (  COZAAR) 100 MG tablet TAKE ONE TABLET BY MOUTH EVERY DAY  30 tablet  3  . metFORMIN (GLUCOPHAGE) 1000 MG tablet Take 1 tablet (1,000 mg total) by mouth 2 (two) times daily with a meal.  60 tablet  2  . omeprazole (PRILOSEC) 20 MG capsule Take 20 mg by mouth daily.        [name] PARoxetine (PAXIL) 30 MG tablet TAKE ONE TABLET BY MOUTH EVERY DAY  30 tablet  3    BP 140/80  Pulse 60  Temp(Src) 97.9 F (36.6 C) (Oral)  Resp 18  Ht 5\' 10"  (1.778 m)  Wt 306 lb (138.801 kg)  BMI 43.91 kg/m2       Objective:   Physical Exam  Constitutional: He appears well-developed and well-nourished. No distress.  Skin: Skin is warm and dry.          Left forearm has raised dry scaled lesion.  Left jaw line- skin tag is noted.            Assessment & Plan:

## 2011-01-16 NOTE — Assessment & Plan Note (Signed)
Skin tag was treated with liquid nitrogen today.

## 2011-01-16 NOTE — Assessment & Plan Note (Signed)
Procedure including risks/benefits explained to patient.  Questions were answered. After informed consent was obtained and a time out completed, site was cleansed with betadine and then alcohol. 1% Lidocaine with epinephrine was injected under lesion and then shave biopsy was performed. Area was cauterized to obtain hemostasis.  Pt tolerated procedure well.  Specimen sent for pathology review.  Pt instructed to keep the area dry for 24 hours and to contact us if he develops redness, drainage or swelling at the site.  Pt may use tylenol as needed for discomfort today.    

## 2011-01-16 NOTE — Assessment & Plan Note (Signed)
He asked if I would manage his testosterone level.  I have advised him that It is my preference that this be done with urology. Pt verbalizes understanding.

## 2011-01-23 ENCOUNTER — Telehealth: Payer: Self-pay | Admitting: Family

## 2011-01-23 ENCOUNTER — Encounter: Payer: Self-pay | Admitting: Family

## 2011-01-23 DIAGNOSIS — D0460 Carcinoma in situ of skin of unspecified upper limb, including shoulder: Secondary | ICD-10-CM | POA: Insufficient documentation

## 2011-01-23 DIAGNOSIS — C44621 Squamous cell carcinoma of skin of unspecified upper limb, including shoulder: Secondary | ICD-10-CM

## 2011-01-23 HISTORY — DX: Squamous cell carcinoma of skin of unspecified upper limb, including shoulder: C44.621

## 2011-01-23 HISTORY — PX: MELANOMA EXCISION: SHX5266

## 2011-01-23 NOTE — Telephone Encounter (Signed)
Called pt, reviewed skin biopsy results and need for follow up with dermatology. He has a dermatologist and will call us with the name and number.

## 2011-01-23 NOTE — Telephone Encounter (Signed)
Pt called back, told me that he does not have a dermatologist.  Again reviewed the results with him and stressed importance of follow up with Derm.  He verbalized understanding.

## 2011-01-27 ENCOUNTER — Other Ambulatory Visit: Payer: Self-pay | Admitting: Dermatology

## 2011-01-27 ENCOUNTER — Ambulatory Visit (HOSPITAL_COMMUNITY): Payer: Medicare Other | Attending: Family | Admitting: Radiology

## 2011-01-27 ENCOUNTER — Encounter (HOSPITAL_COMMUNITY): Payer: Self-pay | Admitting: Radiology

## 2011-01-27 ENCOUNTER — Other Ambulatory Visit (HOSPITAL_COMMUNITY): Payer: Self-pay | Admitting: Radiology

## 2011-01-27 DIAGNOSIS — R0989 Other specified symptoms and signs involving the circulatory and respiratory systems: Secondary | ICD-10-CM

## 2011-01-27 DIAGNOSIS — R0602 Shortness of breath: Secondary | ICD-10-CM

## 2011-01-27 MED ORDER — TECHNETIUM TC 99M TETROFOSMIN IV KIT
33.0000 | PACK | Freq: Once | INTRAVENOUS | Status: AC | PRN
  Administered 2011-01-27: 33 via INTRAVENOUS

## 2011-01-28 ENCOUNTER — Telehealth: Payer: Self-pay | Admitting: Family

## 2011-01-28 ENCOUNTER — Ambulatory Visit (HOSPITAL_COMMUNITY): Payer: Medicare Other | Attending: Cardiology | Admitting: Radiology

## 2011-01-28 ENCOUNTER — Encounter (HOSPITAL_COMMUNITY): Payer: Medicare Other | Admitting: Radiology

## 2011-01-28 VITALS — BP 119/62 | Ht 70.0 in | Wt 303.0 lb

## 2011-01-28 DIAGNOSIS — Z87891 Personal history of nicotine dependence: Secondary | ICD-10-CM | POA: Insufficient documentation

## 2011-01-28 DIAGNOSIS — E119 Type 2 diabetes mellitus without complications: Secondary | ICD-10-CM | POA: Insufficient documentation

## 2011-01-28 DIAGNOSIS — I10 Essential (primary) hypertension: Secondary | ICD-10-CM | POA: Insufficient documentation

## 2011-01-28 DIAGNOSIS — R42 Dizziness and giddiness: Secondary | ICD-10-CM | POA: Insufficient documentation

## 2011-01-28 DIAGNOSIS — R5383 Other fatigue: Secondary | ICD-10-CM | POA: Insufficient documentation

## 2011-01-28 DIAGNOSIS — E785 Hyperlipidemia, unspecified: Secondary | ICD-10-CM | POA: Insufficient documentation

## 2011-01-28 DIAGNOSIS — R5381 Other malaise: Secondary | ICD-10-CM | POA: Insufficient documentation

## 2011-01-28 DIAGNOSIS — Z8249 Family history of ischemic heart disease and other diseases of the circulatory system: Secondary | ICD-10-CM | POA: Insufficient documentation

## 2011-01-28 DIAGNOSIS — R0602 Shortness of breath: Secondary | ICD-10-CM | POA: Insufficient documentation

## 2011-01-28 DIAGNOSIS — R000 Tachycardia, unspecified: Secondary | ICD-10-CM | POA: Insufficient documentation

## 2011-01-28 DIAGNOSIS — R0609 Other forms of dyspnea: Secondary | ICD-10-CM | POA: Insufficient documentation

## 2011-01-28 DIAGNOSIS — E669 Obesity, unspecified: Secondary | ICD-10-CM | POA: Insufficient documentation

## 2011-01-28 DIAGNOSIS — R0989 Other specified symptoms and signs involving the circulatory and respiratory systems: Secondary | ICD-10-CM | POA: Insufficient documentation

## 2011-01-28 DIAGNOSIS — R079 Chest pain, unspecified: Secondary | ICD-10-CM | POA: Insufficient documentation

## 2011-01-28 MED ORDER — TECHNETIUM TC 99M TETROFOSMIN IV KIT
33.0000 | PACK | Freq: Once | INTRAVENOUS | Status: AC | PRN
  Administered 2011-01-28: 33 via INTRAVENOUS

## 2011-01-28 MED ORDER — REGADENOSON 0.4 MG/5ML IV SOLN
0.4000 mg | Freq: Once | INTRAVENOUS | Status: AC
  Administered 2011-01-28: 0.4 mg via INTRAVENOUS

## 2011-01-28 NOTE — Progress Notes (Signed)
[facility] SITE 3 NUCLEAR MED [address] [place] [address] [phone]  Cardiology Nuclear Med Study  Christian Morris is a 71 y.o. male [id number] 10-27-1940   Nuclear Med Background Indication for Stress Test:  Evaluation for Ischemia History: ~15 yrs ago Heart Catheterization([place], [place]):OK per patient Cardiac Risk Factors: Family History - CAD, History of Smoking, Hypertension, Lipids, NIDDM and Obesity  Symptoms:  Chest Pain (last episode of chest discomfort was about one month ago), Dizziness, DOE/SOB, Fatigue and Rapid HR    Nuclear Pre-Procedure Caffeine/Decaff Intake:  None NPO After: 7:00pm   Lungs:  Clear.  O2 SAT 98% on RA IV 0.9% NS with Angio Cath:  20g  IV Site: R Hand  IV Started by:  Bonnita Levan, RN  Chest Size (in):  52 Cup Size: n/a  Height: 5\' 10"  (1.778 m)  Weight:  303 lb (137.44 kg)  BMI:  Body mass index is 43.48 kg/(m^2). Tech Comments:  N/A    Nuclear Med Study 1 or 2 day study: 2 day  Stress Test Type:  Lexiscan  Reading MD: Marca Ancona, MD  Order Authorizing Provider:  Charlynn Court, MD; Sandford Craze, NP  Resting Radionuclide: Technetium 9m Tetrofosmin  Resting Radionuclide Dose: 33.0 mCi on 01/27/11   Stress Radionuclide:  Technetium 32m Tetrofosmin  Stress Radionuclide Dose: 33.0 mCi on 01/28/11           Stress Protocol Rest HR: 61 Stress HR: 87  Rest BP: 119/62 Stress BP: 131/73  Exercise Time (min): n/a METS: n/a   Predicted Max HR: 148 bpm % Max HR: 58.78 bpm Rate Pressure Product: 21308   Dose of Adenosine (mg):  n/a Dose of Lexiscan: 0.4 mg  Dose of Atropine (mg): n/a Dose of Dobutamine: n/a mcg/kg/min (at max HR)  Stress Test Technologist: Smiley Houseman, CMA-N  Nuclear Technologist:  Domenic Polite, CNMT     Rest Procedure:  Myocardial perfusion imaging was performed at rest 45 minutes following the intravenous administration of Technetium 9m Tetrofosmin.  Rest ECG: No Acute  changes.  Stress Procedure:  The patient received IV Lexiscan 0.4 mg over 15-seconds.  Technetium 68m Tetrofosmin injected at 30-seconds.  There were no significant changes with Lexiscan.  Quantitative spect images were obtained after a 45 minute delay.  Stress ECG: No significant change from baseline ECG  QPS Raw Data Images:  Normal; no motion artifact; normal heart/lung ratio. Stress Images:  There is decreased uptake in the apex. Rest Images:  There is decreased uptake in the apex. Subtraction (SDS):  Small fixed apical perfusion defect.  Transient Ischemic Dilatation (Normal <1.22):  0.95 Lung/Heart Ratio (Normal <0.45):  0.32  Quantitative Gated Spect Images QGS EDV:  170 ml QGS ESV:  73 ml QGS cine images:  NL LV Function; NL Wall Motion QGS EF: 57%  Impression Exercise Capacity:  Lexiscan with no exercise. BP Response:  Normal blood pressure response. Clinical Symptoms:  Mild chest pain.  ECG Impression:  No significant ST segment change suggestive of ischemia. Comparison with Prior Nuclear Study: No previous nuclear study performed  Overall Impression:  Low risk stress nuclear study.  There is a small fixed apical perfusion defect that may be due to apical thinning.  Cannot rule out small prior MI.  EF is normal.   Marca Ancona

## 2011-01-28 NOTE — Telephone Encounter (Signed)
Patient states that his CPAP machine is broken. He wants to know how he can get a new one?

## 2011-01-29 ENCOUNTER — Other Ambulatory Visit (HOSPITAL_COMMUNITY): Payer: Self-pay | Admitting: Radiology

## 2011-01-29 NOTE — Telephone Encounter (Signed)
Spoke with pt, he states he went to CPAP DME provider yesterday. Machine was still under warranty and was replaced. No further assistance needed.

## 2011-01-30 ENCOUNTER — Other Ambulatory Visit: Payer: Self-pay | Admitting: Family

## 2011-01-30 ENCOUNTER — Telehealth: Payer: Self-pay | Admitting: Family

## 2011-01-30 NOTE — Telephone Encounter (Signed)
Pls call pt and let him know that his stress test was normal.

## 2011-01-30 NOTE — Telephone Encounter (Signed)
Notified pt. 

## 2011-02-03 ENCOUNTER — Other Ambulatory Visit: Payer: Self-pay | Admitting: Family

## 2011-02-03 NOTE — Telephone Encounter (Signed)
DILTIAZEM 108MG /24 CAP QTY 30

## 2011-02-03 NOTE — Telephone Encounter (Signed)
Refills sent to [facility] for Taztia and HCTZ #30 x 2 refills each. Pt notified.

## 2011-02-06 ENCOUNTER — Telehealth: Payer: Self-pay | Admitting: *Deleted

## 2011-02-06 ENCOUNTER — Other Ambulatory Visit: Payer: Self-pay | Admitting: Family

## 2011-02-06 NOTE — Telephone Encounter (Signed)
Received order for CPAP supplies. We have no previous sleep study on file. Notation from historical records indicate pt was supposed to have sleep study in 2010. Did pt complete? Need date of last sleep study. Pt will need to repeat study if it has been 4 or more years since last test.

## 2011-02-11 NOTE — Telephone Encounter (Signed)
Left message on machine to return my call. 

## 2011-02-11 NOTE — Telephone Encounter (Signed)
Spoke with pt. He doesn't remember if he had a sleep study in 2010. Advised pt to contact previous provider and ask them to forward his most recent result to [place] so we will know how to proceed. Pt will call back with outcome.

## 2011-02-16 ENCOUNTER — Other Ambulatory Visit: Payer: Self-pay | Admitting: Family

## 2011-02-16 NOTE — Telephone Encounter (Signed)
Valium #30 x no refills and Hydrocodone-acetaminophen #90 x no refills given to Y[facility] at [facility]. Notified pt.

## 2011-02-16 NOTE — Telephone Encounter (Signed)
Spoke with pt and advised him to come by the office and sign a records release as he has not had success in obtaining info. From Dr Jason Nest.  He states he will sign release tomorrow.

## 2011-02-17 NOTE — Telephone Encounter (Signed)
Pt signed records release and I faxed form to [phone], Dr Jason Nest for most recent sleep study result.

## 2011-02-18 ENCOUNTER — Telehealth: Payer: Self-pay | Admitting: *Deleted

## 2011-02-18 NOTE — Telephone Encounter (Signed)
Left message on nurse voicemail at [phone] to send consultation note from 01/27/11 and to call if any questions.

## 2011-02-18 NOTE — Telephone Encounter (Signed)
Message copied by Kathi Simpers on Wed Feb 18, 2011 11:50 AM ------      Message from: O'SULLIVAN, MELISSA      Created: Wed Feb 18, 2011  9:32 AM       Could you please request consult from dermatology referral? thanks

## 2011-02-18 NOTE — Telephone Encounter (Signed)
Consultation notes/pathology received and forwarded to Provider for review.

## 2011-02-19 ENCOUNTER — Telehealth: Payer: Self-pay | Admitting: *Deleted

## 2011-02-19 NOTE — Telephone Encounter (Signed)
He should drink small frequent amounts of liquid.  If no improvement, should be seen in the office tomorrow.  If symptoms worsen go to the ED.

## 2011-02-19 NOTE — Telephone Encounter (Signed)
Notified pt. 

## 2011-02-19 NOTE — Telephone Encounter (Signed)
Received call from pt stating he has had n/v since early this morning. Has only had some slight abdominal pain that has resolved at present. Pt wants to know what he can do for his symptoms? Please advise.

## 2011-02-24 NOTE — Telephone Encounter (Signed)
Spoke with Christian Morris in Medical Records with Dr Jason Nest' office re: status of record request. She states that she has not received request and provided new fax # [phone] as her direct line. Request has been faxed to new number.

## 2011-02-27 ENCOUNTER — Other Ambulatory Visit: Payer: Self-pay | Admitting: Family

## 2011-03-02 ENCOUNTER — Other Ambulatory Visit: Payer: Self-pay | Admitting: Family

## 2011-03-02 ENCOUNTER — Telehealth: Payer: Self-pay | Admitting: Family

## 2011-03-02 MED ORDER — ATORVASTATIN CALCIUM 40 MG PO TABS: 40.0000 mg | ORAL_TABLET | Freq: Every day | ORAL | Status: DC

## 2011-03-02 NOTE — Telephone Encounter (Signed)
Refill sent to pharmacy for lipitor #30 x 2 refills. Left message on pt's voicemail re: refill completion and to call if any question.

## 2011-03-02 NOTE — Telephone Encounter (Signed)
Spoke with pharmacy and they state there are no pending Rx requests. Encounter closed.

## 2011-03-05 NOTE — Telephone Encounter (Signed)
Spoke with Inetta Fermo at Dr Jason Nest' ofc re: status of record request. She states she did not receive my 2nd fax and requests that I fax request again and she will process request. Verified fax # and refaxed release to number below.

## 2011-03-06 ENCOUNTER — Telehealth: Payer: Self-pay | Admitting: Family

## 2011-03-06 NOTE — Telephone Encounter (Signed)
Spoke with pt and he does not remember the name of the medication he needs refilled. States he still has a few left and has not contacted the pharmacy yet. Advised pt to contact pharmacy for refill first. Pt voices understanding.

## 2011-03-06 NOTE — Telephone Encounter (Signed)
Pt left voice message requesting refill for medication and only left Rx number. Attempted to reach pt to obtain medication name as we do not process Rxs using the Rx# and left message to return my call.

## 2011-03-09 ENCOUNTER — Encounter: Payer: Self-pay | Admitting: Family

## 2011-03-09 ENCOUNTER — Ambulatory Visit (INDEPENDENT_AMBULATORY_CARE_PROVIDER_SITE_OTHER): Payer: Medicare Other | Admitting: Family

## 2011-03-09 DIAGNOSIS — F341 Dysthymic disorder: Secondary | ICD-10-CM

## 2011-03-09 DIAGNOSIS — E119 Type 2 diabetes mellitus without complications: Secondary | ICD-10-CM

## 2011-03-09 DIAGNOSIS — E785 Hyperlipidemia, unspecified: Secondary | ICD-10-CM

## 2011-03-09 DIAGNOSIS — D0460 Carcinoma in situ of skin of unspecified upper limb, including shoulder: Secondary | ICD-10-CM

## 2011-03-09 DIAGNOSIS — I10 Essential (primary) hypertension: Secondary | ICD-10-CM

## 2011-03-09 MED ORDER — DIAZEPAM 10 MG PO TABS: ORAL_TABLET | ORAL | Status: DC

## 2011-03-09 MED ORDER — LOSARTAN POTASSIUM 100 MG PO TABS: ORAL_TABLET | ORAL | Status: DC

## 2011-03-09 MED ORDER — HYDROCHLOROTHIAZIDE 25 MG PO TABS: 25.0000 mg | ORAL_TABLET | Freq: Every day | ORAL | Status: DC

## 2011-03-09 MED ORDER — HYDROCODONE-ACETAMINOPHEN 7.5-500 MG PO TABS: ORAL_TABLET | ORAL | Status: DC

## 2011-03-09 NOTE — Assessment & Plan Note (Signed)
LDL is at goal.  Due for LFT's.

## 2011-03-09 NOTE — Patient Instructions (Signed)
Please go to lab fasting after 3/11 to complete your lab work. Follow up here in 3 months, sooner if problems/concerns.

## 2011-03-09 NOTE — Progress Notes (Signed)
Subjective:    Patient ID: Christian Morris, male    DOB: 1940/06/10, 71 y.o.   MRN: [id number]  HPI  Mr.  Elms is a 71 yr old male who presents today for routine follow up.   DM2- reports that he had episode of severe shaking while in [employer]- he did not check his sugar.  He went to [facility] and had an apple pie and a coke and reports feeling much better.  HTN- He reports compliance with meds. Denies CP/sob or swelling.  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    Anxiety/depression- He reports that pxil is working well for him.       Review of Systems See HPI  Past Medical History  Diagnosis Date  . Arthritis   . Depression   . Diabetes mellitus   . Hypertension   . Hyperlipidemia   . History of gastric ulcer   . Transfusion history     due to bleeding ulcer  . Hypogonadism male 01/11/2011  . Squamous cell carcinoma in situ of skin of forearm 01/23/2011    History   Social History  . Marital Status: Divorced    Spouse Name: N/A    Number of Children: N/A  . Years of Education: N/A   Occupational History  . Not on file.   Social History Main Topics  . Smoking status: Former Smoker    Types: Cigarettes    Quit date: 01/05/1982  . Smokeless tobacco: Not on file  . Alcohol Use: No  . Drug Use: No  . Sexually Active: Not on file   Other Topics Concern  . 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    Past Surgical History  Procedure Date  . Eye surgery 08/2009    cataract removal left eye--Dr Dione Booze    Family History  Problem Relation Age of Onset  . Arthritis Mother   . Heart disease Sister     MI  . Heart disease Brother     MI  . Heart disease Brother     MI    Allergies  Allergen Reactions  . Aspirin     REACTION: hx of ulcers    Current Outpatient Prescriptions on File Prior to Visit  Medication Sig Dispense Refill  .  albuterol (PROAIR HFA) 108 (90 BASE) MCG/ACT inhaler Inhale 2 puffs into the lungs every 6 (six) hours as needed.  18 g  1  . aspirin 81 MG EC tablet Take 81 mg by mouth daily.        [name] atorvastatin (LIPITOR) 40 MG tablet Take 1 tablet (40 mg total) by mouth daily.  30 tablet  2  . fish oil-omega-3 fatty acids 1000 MG capsule Take 2 g by mouth 2 (two) times daily.        [name] glucose blood test strip Use as instructed  100 each  12  . metFORMIN (GLUCOPHAGE) 1000 MG tablet TAKE ONE TABLET BY MOUTH TWICE DAILY WITH MEALS  60 tablet  1  . omeprazole (PRILOSEC) 20 MG capsule Take 20 mg by mouth daily.        [name] PARoxetine (PAXIL) 30 MG tablet TAKE ONE TABLET BY MOUTH EVERY DAY  30 tablet  2  . TAZTIA XT 180 MG 24 hr capsule TAKE ONE CAPSULE BY MOUTH EVERY DAY  30 each  2    BP  118/68  Pulse 68  Temp(Src) 97.8 F (36.6 C) (Oral)  Resp 16  Wt 299 lb (135.626 kg)  SpO2 94%       Objective:   Physical Exam  Constitutional: He appears well-developed and well-nourished. No distress.  Cardiovascular: Normal rate and regular rhythm.   No murmur heard. Pulmonary/Chest: Effort normal and breath sounds normal. No respiratory distress. He has no wheezes. He has no rales. He exhibits no tenderness.  Musculoskeletal: He exhibits no edema.  Psychiatric: He has a normal mood and affect. His behavior is normal. Judgment and thought content normal.          Assessment & Plan:

## 2011-03-09 NOTE — Assessment & Plan Note (Signed)
Management per dermatology

## 2011-03-09 NOTE — Assessment & Plan Note (Signed)
Episode of presumed symptomatic hypoglycemia. He has started back walking 3-4 miles a day. Will plan to check A1C after 3/11, d/c amaryl due to episode of hypoglycemia. Obtain urine microalbumin, obtain BMET. Continue metformin.

## 2011-03-09 NOTE — Assessment & Plan Note (Signed)
BP stable on current meds. Continue same.  

## 2011-03-09 NOTE — Assessment & Plan Note (Signed)
Stable on paxil, continue same. 

## 2011-03-16 ENCOUNTER — Telehealth: Payer: Self-pay | Admitting: Family

## 2011-03-16 NOTE — Telephone Encounter (Signed)
Pls call pt and remind him to complete fasting lab work this week.

## 2011-03-16 NOTE — Telephone Encounter (Signed)
No additional lab work needed. I recommend that he try using a good moisturizer daily such as lubriderm.  If no improvement, then he should book visit to be see.

## 2011-03-16 NOTE — Telephone Encounter (Signed)
Notified pt. 

## 2011-03-16 NOTE — Telephone Encounter (Signed)
Pt states he has itching on hands and legs x 2 weeks. No visible rash on body. States he has not changed any soaps or detergent. Not currently taking antihistamines. Pt wanted to know if you wanted to add any tests to his blood work today?  Please advise.

## 2011-03-17 ENCOUNTER — Telehealth: Payer: Self-pay | Admitting: Family

## 2011-03-17 LAB — HEPATIC FUNCTION PANEL
ALT: 16 U/L (ref 0–53)
Bilirubin, Direct: 0.1 mg/dL (ref 0.0–0.3)
Indirect Bilirubin: 0.4 mg/dL (ref 0.0–0.9)
Total Bilirubin: 0.5 mg/dL (ref 0.3–1.2)

## 2011-03-17 LAB — MICROALBUMIN / CREATININE URINE RATIO

## 2011-03-17 LAB — BASIC METABOLIC PANEL
Chloride: 105 mEq/L (ref 96–112)
Creat: 1.65 mg/dL — ABNORMAL HIGH (ref 0.50–1.35)

## 2011-03-17 NOTE — Telephone Encounter (Signed)
Pls call pt and let him know that his kidney function is up.  I would like to see him back in the office.  In the meantime, he should hold cozaar and metformin.

## 2011-03-17 NOTE — Telephone Encounter (Signed)
Pt called back concerned about being off of Metformin for 1 week. Advised pt to watch his diet, avoid concentrated sweets, check his blood sugar daily and call us if glucometer readings are elevated above usual readings. Pt states he lost his glucometer. Advised pt he could pick up a Freestyle Freedom Lite glucometer from our office. Meter placed at front desk for pick up. Please advise if further instructions.

## 2011-03-17 NOTE — Telephone Encounter (Signed)
Notified pt. He states he takes 2 Aleve every 3-5 days. Advised pt to stop Aleve until he follows up with [place] in the office. F/u scheduled for 03/24/11 at 10:30am.

## 2011-03-17 NOTE — Telephone Encounter (Signed)
Received medical records from Dr. Jason Nest regarding most recent sleep study

## 2011-03-17 NOTE — Telephone Encounter (Signed)
Sleep Study from 02/2008 received from Dr Jason Nest' office and forwarded to provider for review and form completion.

## 2011-03-18 NOTE — Telephone Encounter (Signed)
Faxed signed order for CPAP supplies to [facility] at [phone].

## 2011-03-23 ENCOUNTER — Ambulatory Visit (INDEPENDENT_AMBULATORY_CARE_PROVIDER_SITE_OTHER): Payer: Medicare Other | Admitting: Family

## 2011-03-23 ENCOUNTER — Telehealth: Payer: Self-pay | Admitting: *Deleted

## 2011-03-23 ENCOUNTER — Encounter: Payer: Self-pay | Admitting: Family

## 2011-03-23 VITALS — BP 120/78 | HR 73 | Temp 98.6°F | Resp 16 | Ht 70.0 in | Wt 286.1 lb

## 2011-03-23 DIAGNOSIS — N289 Disorder of kidney and ureter, unspecified: Secondary | ICD-10-CM

## 2011-03-23 DIAGNOSIS — E119 Type 2 diabetes mellitus without complications: Secondary | ICD-10-CM

## 2011-03-23 DIAGNOSIS — R197 Diarrhea, unspecified: Secondary | ICD-10-CM

## 2011-03-23 LAB — GLUCOSE, POCT (MANUAL RESULT ENTRY): POC Glucose: 151

## 2011-03-23 MED ORDER — DIPHENOXYLATE-ATROPINE 2.5-0.025 MG PO TABS: 1.0000 | ORAL_TABLET | Freq: Four times a day (QID) | ORAL | Status: DC | PRN

## 2011-03-23 NOTE — Progress Notes (Signed)
Subjective:    Patient ID: Christian Morris, male    DOB: July 29, 1940, 71 y.o.   MRN: [id number]  HPI  [name] is a 71 yr old male who presents today with chief complaint of diarrhea.  Started about 5-6 days ago.  Reports that his appetite ok, but goes "straight through me." He denies recent antibiotic use. He notes feeling hot at times.  No known fever.  Notes occasional abdominal cramping with bowel movement.  Was initially constipated x 3 days.  Reports stools are normal brown color. Denies associated black/bloody stools.   Renal insuffiency- has stopped metformin.  Has also stopped aleve.     Review of Systems See HPI  Past Medical History  Diagnosis Date  . Arthritis   . Depression   . Diabetes mellitus   . Hypertension   . Hyperlipidemia   . History of gastric ulcer   . Transfusion history     due to bleeding ulcer  . Hypogonadism male 01/11/2011  . Squamous cell carcinoma in situ of skin of forearm 01/23/2011    History   Social History  . Marital Status: Divorced    Spouse Name: N/A    Number of Children: N/A  . Years of Education: N/A   Occupational History  . Not on file.   Social History Main Topics  . Smoking status: Former Smoker    Types: Cigarettes    Quit date: 01/05/1982  . Smokeless tobacco: Not on file  . Alcohol Use: No  . Drug Use: No  . Sexually Active: Not on file   Other Topics Concern  . 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    Past Surgical History  Procedure Date  . Eye surgery 08/2009    cataract removal left eye--Dr Dione Booze    Family History  Problem Relation Age of Onset  . Arthritis Mother   . Heart disease Sister     MI  . Heart disease Brother     MI  . Heart disease Brother     MI    Allergies  Allergen Reactions  . Aspirin     REACTION: hx of ulcers    Current Outpatient Prescriptions on File Prior to Visit  Medication Sig  Dispense Refill  . albuterol (PROAIR HFA) 108 (90 BASE) MCG/ACT inhaler Inhale 2 puffs into the lungs every 6 (six) hours as needed.  18 g  1  . aspirin 81 MG EC tablet Take 81 mg by mouth daily.        [name] atorvastatin (LIPITOR) 40 MG tablet Take 1 tablet (40 mg total) by mouth daily.  30 tablet  2  . diazepam (VALIUM) 10 MG tablet One tablet by mouth every day as needed for anxiety.  30 tablet  0  . fish oil-omega-3 fatty acids 1000 MG capsule Take 2 g by mouth 2 (two) times daily.        [name] glucose blood test strip Use as instructed  100 each  12  . hydrochlorothiazide (HYDRODIURIL) 25 MG tablet Take 1 tablet (25 mg total) by mouth daily.  30 tablet  5  . HYDROcodone-acetaminophen (LORTAB) 7.5-500 MG per tablet One tablet by mouth 3 times daily  90 tablet  0  . omeprazole (PRILOSEC) 20 MG capsule Take 20 mg by mouth daily.        [name] PARoxetine (PAXIL) 30 MG tablet TAKE  ONE TABLET BY MOUTH EVERY DAY  30 tablet  2  . TAZTIA XT 180 MG 24 hr capsule TAKE ONE CAPSULE BY MOUTH EVERY DAY  30 each  2  . losartan (COZAAR) 100 MG tablet One tablet by mouth daily  30 tablet  5  . metFORMIN (GLUCOPHAGE) 1000 MG tablet TAKE ONE TABLET BY MOUTH TWICE DAILY WITH MEALS  60 tablet  1    BP 120/78  Pulse 73  Temp(Src) 98.6 F (37 C) (Oral)  Resp 16  Ht 5\' 10"  (1.778 m)  Wt 286 lb 2.1 oz (129.788 kg)  BMI 41.06 kg/m2  SpO2 97%       Objective:   Physical Exam  Constitutional: He is oriented to person, place, and time. He appears well-developed and well-nourished. No distress.  Cardiovascular: Normal rate and regular rhythm.   No murmur heard. Pulmonary/Chest: Effort normal and breath sounds normal. No respiratory distress. He has no wheezes. He has no rales. He exhibits no tenderness.  Abdominal: Soft.       Mild abdominal distension, without rebound tenderness or guarding.   Musculoskeletal: He exhibits no edema.  Neurological: He is alert and oriented to person, place, and time.  Skin: Skin is  warm and dry. No rash noted. No erythema. No pallor.  Psychiatric: He has a normal mood and affect. His behavior is normal. Judgment and thought content normal.          Assessment & Plan:

## 2011-03-23 NOTE — Patient Instructions (Signed)
Please complete stool studies and return to the lab.  Complete blood work. Call if your diarrhea worsens, or if you do not notice improvement in 1-2 days.   Go to ER if you develop worsening weakness, nausea, fever over 101, bloody stool or if you develop abdominal pain.

## 2011-03-23 NOTE — Assessment & Plan Note (Signed)
Likely due to NSAID use.  Obtain follow up bmet today.  Continue to hold ARB and Metformin.

## 2011-03-23 NOTE — Assessment & Plan Note (Addendum)
Likely viral, but will send stool studies, obtain BMET and CBC.  Start prn lomotil.  Reinforced importance of hydration. Pt is instructed to call if symptoms worsen, or if no improvement in 2-3 days. If symptoms worsen, will plan to obtain CT abd/pelvis.

## 2011-03-23 NOTE — Telephone Encounter (Signed)
Pt has appt with [place] today at 2:45pm.  [facility] Triage Call Report Triage Record Num: [id number] Operator: Alphonsa Overall Patient Name: Christian Morris Call Date & Time: 03/21/2011 5:45:53PM Patient Phone: [phone] PCP: Sandford Craze, NP Patient Gender: Male PCP Fax : [phone] Patient DOB: 11-Jun-1940 Practice Name: [facility] Reason for Call: Caller: Christian Morris/Other; PCP: Christian Morris, [name]; CB#: [phone]; Call regarding Blood sugar questions Christian Morris/friend calling about blood sugar issues. 196 this am. Pt off medication for glucose, for kidney issues this week. 126 glucose at 1330. Pt feeling weak and not well 03/21/11. Possible fever. Christian Morris aware pt needs evaluation within 4 hours for symptoms. Protocol(s) Used: Diabetes: Control Problems Recommended Outcome per Protocol: See Provider within 4 hours Reason for Outcome: New or increasing symptoms OR glucose not within provider defined guidelines AND prescribed change in medication or treatment plan in last 72 hours Care Advice: ~ Another adult should drive. Follow the usual meal plan if possible. Drink extra non-caloric fluids. If unable to eat at all, drink regular soft drinks and juices so that 50 grams of carbohydrates are taken in every 3 to 4 hours. ~ ~ Call provider if symptoms worsen before scheduled appointment. Go to ED immediately if blood sugar is 300 mg/dl or more AND symptoms are present including: large urine ketones, rapid, deep breathing, fruity breath, nausea, vomiting or abdominal pain; confused thinking, dry, flushed skin; extreme thirst, extreme fatigue or weakness; or frequent urination. ~ ~ SYMPTOM / CONDITION MANAGEMENT ~ List, or take, all current prescription(s), nonprescription or alternative medication(s) to provider for evaluation. ~ Check blood sugar and ketones before calling provider ~ Call EMS 911 if any loss of consciousness, difficult to awaken, slow to respond, or new onset of  confused thinking. Medication Advice: - Discontinue all nonprescription and alternative medications, especially stimulants, until evaluated by provider. - Take prescribed medications as directed, following label instructions for the medication. - Do not change medications or dosing regimen until provider is consulted. - Know possible side effects of medication and what to do if they occur. - Tell provider all prescription, nonprescription or alternative medications that you take ~ Low Blood Sugar Treatment: - Always have some form of glucose available for use. This may include glucose tablets or hard candies. - When blood sugar is known to be low, take 4 oz. (0.1 liter) of juice, 3 glucose tablets, or 5-6 hard candies. - If unavailable, take 10 to 15 grams of any carbohydrate: -- 6-8 oz. (.15 to .2 liter) skim or 1% milk -- candy bar, fruit, cheese or crackers - Retest blood sugar after 15 to 20 minutes. - If blood sugar still less than 80 mg/dl, continue treating and testing until blood sugar reaches 80 mg/dl. - Be careful not to overtreat. ~ High Blood Sugar Treatment: - Follow action plan. ~ 03/21/2011 5:58:12PM Page 1 of 2 [id number] [facility] Triage Call Report Patient Name: Vennie Waymire continuation page/s - Adjust medications if instructed to do so in action plan or by provider. - Test blood sugar and ketones more often. - Record blood sugar and ketones in meter log or separate logbook, and take to all provider visits. - Drink extra water and other non-sugar fluids to prevent dehydration when the blood sugar is high

## 2011-03-24 ENCOUNTER — Ambulatory Visit: Payer: Medicare Other | Admitting: Family

## 2011-03-24 ENCOUNTER — Other Ambulatory Visit: Payer: Self-pay | Admitting: *Deleted

## 2011-03-24 LAB — CBC WITH DIFFERENTIAL/PLATELET
Eosinophils Absolute: 0 10*3/uL (ref 0.0–0.7)
Lymphs Abs: 2.5 10*3/uL (ref 0.7–4.0)
MCH: 28.6 pg (ref 26.0–34.0)
Neutrophils Relative %: 62 % (ref 43–77)
Platelets: 247 10*3/uL (ref 150–400)
RBC: 5.24 MIL/uL (ref 4.22–5.81)
WBC: 10.2 10*3/uL (ref 4.0–10.5)

## 2011-03-24 LAB — BASIC METABOLIC PANEL
Calcium: 8.8 mg/dL (ref 8.4–10.5)
Sodium: 137 mEq/L (ref 135–145)

## 2011-03-24 LAB — OVA AND PARASITE SCREEN: OP: NONE SEEN

## 2011-03-24 MED ORDER — GLUCOSE BLOOD VI STRP: ORAL_STRIP | Status: DC

## 2011-03-24 NOTE — Telephone Encounter (Signed)
Rx sent to pharmacy for Freestyle lite test strips #100 x 1 refill.

## 2011-03-24 NOTE — Telephone Encounter (Signed)
Spoke with pt.  He reports that he is feeling "some better." Reports diarrhea is "easing up." has had only one stool so far today.

## 2011-03-24 NOTE — Telephone Encounter (Signed)
Once a day please.

## 2011-03-24 NOTE — Telephone Encounter (Signed)
Received fax from [facility] requesting specific directions and Dx code to process pt's test strip Rx. Will re-send new Rx for Freestyle Lite test strips. How many times a day should pt be testing?

## 2011-03-25 ENCOUNTER — Ambulatory Visit (HOSPITAL_BASED_OUTPATIENT_CLINIC_OR_DEPARTMENT_OTHER)
Admission: RE | Admit: 2011-03-25 | Discharge: 2011-03-25 | Disposition: A | Discharge status: Home / Self Care | Payer: Medicare Other | Source: Ambulatory Visit | Attending: Family | Admitting: Family

## 2011-03-25 ENCOUNTER — Ambulatory Visit (INDEPENDENT_AMBULATORY_CARE_PROVIDER_SITE_OTHER): Payer: Medicare Other | Admitting: Family

## 2011-03-25 ENCOUNTER — Telehealth: Payer: Self-pay | Admitting: *Deleted

## 2011-03-25 ENCOUNTER — Encounter: Payer: Self-pay | Admitting: Family

## 2011-03-25 VITALS — BP 136/84 | HR 67 | Temp 98.0°F | Resp 18 | Wt 287.0 lb

## 2011-03-25 DIAGNOSIS — I10 Essential (primary) hypertension: Secondary | ICD-10-CM | POA: Insufficient documentation

## 2011-03-25 DIAGNOSIS — R197 Diarrhea, unspecified: Secondary | ICD-10-CM

## 2011-03-25 DIAGNOSIS — L853 Xerosis cutis: Secondary | ICD-10-CM

## 2011-03-25 DIAGNOSIS — E119 Type 2 diabetes mellitus without complications: Secondary | ICD-10-CM

## 2011-03-25 DIAGNOSIS — R109 Unspecified abdominal pain: Secondary | ICD-10-CM | POA: Insufficient documentation

## 2011-03-25 DIAGNOSIS — R238 Other skin changes: Secondary | ICD-10-CM

## 2011-03-25 DIAGNOSIS — N200 Calculus of kidney: Secondary | ICD-10-CM | POA: Insufficient documentation

## 2011-03-25 DIAGNOSIS — N289 Disorder of kidney and ureter, unspecified: Secondary | ICD-10-CM

## 2011-03-25 LAB — CLOSTRIDIUM DIFFICILE BY PCR: Toxigenic C. Difficile by PCR: NOT DETECTED

## 2011-03-25 MED ORDER — FEXOFENADINE HCL 180 MG PO TABS: 180.0000 mg | ORAL_TABLET | Freq: Every day | ORAL | Status: DC

## 2011-03-25 NOTE — Progress Notes (Signed)
Subjective:    Patient ID: Christian Morris, male    DOB: 01-27-1940, 71 y.o.   MRN: [id number]  HPI  [name] is a 71 yr old male who presents today for follow up of his diarrhea.    Diarrhea- Having occasional chills.  Diarrhea  X 1 today.  Had some boiled eggs this AM.  He denies abdominal pain currently, but did have cramping.   DM2- Reports that his sugars have been running 96-169- he continues off of metformin due to recent renal insufficiency.  Skin rash- used to see allergist.  Notes that he is having itching on hands and forearms. Denies any associated rash or hives.  Review of Systems See HPI  Past Medical History  Diagnosis Date  . Arthritis   . Depression   . Diabetes mellitus   . Hypertension   . Hyperlipidemia   . History of gastric ulcer   . Transfusion history     due to bleeding ulcer  . Hypogonadism male 01/11/2011  . Squamous cell carcinoma in situ of skin of forearm 01/23/2011    History   Social History  . Marital Status: Divorced    Spouse Name: N/A    Number of Children: N/A  . Years of Education: N/A   Occupational History  . Not on file.   Social History Main Topics  . Smoking status: Former Smoker    Types: Cigarettes    Quit date: 01/05/1982  . Smokeless tobacco: Not on file  . Alcohol Use: No  . Drug Use: No  . Sexually Active: Not on file   Other Topics Concern  . 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    Past Surgical History  Procedure Date  . Eye surgery 08/2009    cataract removal left eye--Dr Dione Booze    Family History  Problem Relation Age of Onset  . Arthritis Mother   . Heart disease Sister     MI  . Heart disease Brother     MI  . Heart disease Brother     MI    Allergies  Allergen Reactions  . Aspirin     REACTION: hx of ulcers    Current Outpatient Prescriptions on File Prior to Visit  Medication Sig Dispense Refill  .  albuterol (PROAIR HFA) 108 (90 BASE) MCG/ACT inhaler Inhale 2 puffs into the lungs every 6 (six) hours as needed.  18 g  1  . aspirin 81 MG EC tablet Take 81 mg by mouth daily.        [name] atorvastatin (LIPITOR) 40 MG tablet Take 1 tablet (40 mg total) by mouth daily.  30 tablet  2  . diazepam (VALIUM) 10 MG tablet One tablet by mouth every day as needed for anxiety.  30 tablet  0  . diphenoxylate-atropine (LOMOTIL) 2.5-0.025 MG per tablet Take 1 tablet by mouth 4 (four) times daily as needed for diarrhea or loose stools.  15 tablet  0  . fish oil-omega-3 fatty acids 1000 MG capsule Take 2 g by mouth 2 (two) times daily.        [name] glucose blood (FREESTYLE LITE) test strip Use as instructed to check blood sugar once a day.  Dx 250.00  100 each  1  . hydrochlorothiazide (HYDRODIURIL) 25 MG tablet Take 1 tablet (25 mg total) by mouth daily.  30 tablet  5  . HYDROcodone-acetaminophen (LORTAB)  7.5-500 MG per tablet One tablet by mouth 3 times daily  90 tablet  0  . losartan (COZAAR) 100 MG tablet One tablet by mouth daily  30 tablet  5  . metFORMIN (GLUCOPHAGE) 1000 MG tablet TAKE ONE TABLET BY MOUTH TWICE DAILY WITH MEALS  60 tablet  1  . omeprazole (PRILOSEC) 20 MG capsule Take 20 mg by mouth daily.        [name] PARoxetine (PAXIL) 30 MG tablet TAKE ONE TABLET BY MOUTH EVERY DAY  30 tablet  2  . TAZTIA XT 180 MG 24 hr capsule TAKE ONE CAPSULE BY MOUTH EVERY DAY  30 each  2    BP 136/84  Pulse 67  Temp(Src) 98 F (36.7 C) (Oral)  Resp 18  Wt 287 lb 0.6 oz (130.2 kg)  SpO2 96%       Objective:   Physical Exam  Constitutional: He appears well-developed and well-nourished. No distress.  Cardiovascular: Normal rate and regular rhythm.   No murmur heard. Pulmonary/Chest: Effort normal and breath sounds normal. No respiratory distress. He has no wheezes. He has no rales. He exhibits no tenderness.  Abdominal:       No distension noted today. Very mild LLQ tenderness to palpation.   Skin:       Skin  appears dry without rash noted.          Assessment & Plan:

## 2011-03-25 NOTE — Patient Instructions (Signed)
Please complete your CT scan on the first floor.    

## 2011-03-25 NOTE — Telephone Encounter (Signed)
I would like to see him back in the office today and I will plan to send him for a CT of the abdomen this afternoon to further evaluate his diarrhea. I will check his rash when I see him.

## 2011-03-25 NOTE — Telephone Encounter (Signed)
Notified pt and scheduled f/u today at 3:30.

## 2011-03-25 NOTE — Telephone Encounter (Signed)
Pt called stating he is having fewer episodes of diarrhea but it still persists. Has had 1 episode this morning.  Also states that he is itching on his arms and between his fingers like "athlete's foot", no rash and wants to know what to do for this? Please advise.

## 2011-03-27 DIAGNOSIS — L853 Xerosis cutis: Secondary | ICD-10-CM | POA: Insufficient documentation

## 2011-03-27 NOTE — Assessment & Plan Note (Signed)
Await follow up bmet next week.  Consider resuming metformin if Cr has normalized, otherwise, will likely add [place].

## 2011-03-27 NOTE — Assessment & Plan Note (Signed)
Diarrhea is slowly improving.  CT abd/pelvis shows no acute GI issues. Continue prn lomotil. Follow up in 1 week as scheduled.

## 2011-03-27 NOTE — Assessment & Plan Note (Signed)
Off NSAIDS, Plan to repeat bmet next visit.

## 2011-03-27 NOTE — Assessment & Plan Note (Signed)
Skin is dry.  No visible rash.  Recommended moisturizer and trial of Allegra to help with any allergy component which may be contributing to itching.

## 2011-03-30 ENCOUNTER — Encounter: Payer: Self-pay | Admitting: Family

## 2011-03-30 ENCOUNTER — Ambulatory Visit (INDEPENDENT_AMBULATORY_CARE_PROVIDER_SITE_OTHER): Payer: Medicare Other | Admitting: Family

## 2011-03-30 VITALS — BP 142/76 | HR 54 | Temp 97.6°F | Resp 16 | Ht 70.0 in | Wt 300.0 lb

## 2011-03-30 DIAGNOSIS — E119 Type 2 diabetes mellitus without complications: Secondary | ICD-10-CM

## 2011-03-30 DIAGNOSIS — N289 Disorder of kidney and ureter, unspecified: Secondary | ICD-10-CM

## 2011-03-30 DIAGNOSIS — R197 Diarrhea, unspecified: Secondary | ICD-10-CM

## 2011-03-30 DIAGNOSIS — L853 Xerosis cutis: Secondary | ICD-10-CM

## 2011-03-30 DIAGNOSIS — R238 Other skin changes: Secondary | ICD-10-CM

## 2011-03-30 LAB — BASIC METABOLIC PANEL WITH GFR
CO2: 30 mEq/L (ref 19–32)
Chloride: 102 mEq/L (ref 96–112)
Sodium: 139 mEq/L (ref 135–145)

## 2011-03-30 NOTE — Assessment & Plan Note (Signed)
Obtain A1C, continue to hold metformin pending renal function results.

## 2011-03-30 NOTE — Assessment & Plan Note (Signed)
Itching seems to be allergy related as it resolved with allegra. Continue Allegra.

## 2011-03-30 NOTE — Assessment & Plan Note (Signed)
Resolved.  Likely due to acute gastroenteritis.

## 2011-03-30 NOTE — Progress Notes (Signed)
Subjective:    Patient ID: Christian Morris, male    DOB: 12-Feb-1940, 71 y.o.   MRN: [id number]  HPI  [name] is a 71 yr old male who presents today for follow up.  1) Diarrhea- pt had CT abd/pelvis last week which was negative. Reports resolution of diarrhea. Appetite has returned to normal.  2) DM2- He reports that he has not been checking his sugars over the weekend. He denies polyuria, polydipsia. He continues to hold metformin as directed.  3)Renal insufficiency- He reports that he had been using aleve prn, but has not taken in several weeks.   4) Pruritis- reports that itching went away with a dose of allegra.        Review of Systems See HPI  Past Medical History  Diagnosis Date  . Arthritis   . Depression   . Diabetes mellitus   . Hypertension   . Hyperlipidemia   . History of gastric ulcer   . Transfusion history     due to bleeding ulcer  . Hypogonadism male 01/11/2011  . Squamous cell carcinoma in situ of skin of forearm 01/23/2011    History   Social History  . Marital Status: Divorced    Spouse Name: N/A    Number of Children: N/A  . Years of Education: N/A   Occupational History  . Not on file.   Social History Main Topics  . Smoking status: Former Smoker    Types: Cigarettes    Quit date: 01/05/1982  . Smokeless tobacco: Not on file  . Alcohol Use: No  . Drug Use: No  . Sexually Active: Not on file   Other Topics Concern  . 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    Past Surgical History  Procedure Date  . Eye surgery 08/2009    cataract removal left eye--Dr Dione Booze    Family History  Problem Relation Age of Onset  . Arthritis Mother   . Heart disease Sister     MI  . Heart disease Brother     MI  . Heart disease Brother     MI    Allergies  Allergen Reactions  . Aspirin     REACTION: hx of ulcers    Current Outpatient Prescriptions on File  Prior to Visit  Medication Sig Dispense Refill  . albuterol (PROAIR HFA) 108 (90 BASE) MCG/ACT inhaler Inhale 2 puffs into the lungs every 6 (six) hours as needed.  18 g  1  . aspirin 81 MG EC tablet Take 81 mg by mouth daily.        [name] atorvastatin (LIPITOR) 40 MG tablet Take 1 tablet (40 mg total) by mouth daily.  30 tablet  2  . diazepam (VALIUM) 10 MG tablet One tablet by mouth every day as needed for anxiety.  30 tablet  0  . diphenoxylate-atropine (LOMOTIL) 2.5-0.025 MG per tablet Take 1 tablet by mouth 4 (four) times daily as needed for diarrhea or loose stools.  15 tablet  0  . fexofenadine (ALLEGRA) 180 MG tablet Take 1 tablet (180 mg total) by mouth daily.      . fish oil-omega-3 fatty acids 1000 MG capsule Take 2 g by mouth 2 (two) times daily.        [name] glucose blood (FREESTYLE LITE) test strip Use as instructed to check blood sugar once a day.  Dx 250.00  100 each  1  . hydrochlorothiazide (HYDRODIURIL) 25 MG tablet Take 1 tablet (25 mg total) by mouth daily.  30 tablet  5  . HYDROcodone-acetaminophen (LORTAB) 7.5-500 MG per tablet One tablet by mouth 3 times daily  90 tablet  0  . losartan (COZAAR) 100 MG tablet One tablet by mouth daily  30 tablet  5  . metFORMIN (GLUCOPHAGE) 1000 MG tablet TAKE ONE TABLET BY MOUTH TWICE DAILY WITH MEALS  60 tablet  1  . omeprazole (PRILOSEC) 20 MG capsule Take 20 mg by mouth daily.        [name] PARoxetine (PAXIL) 30 MG tablet TAKE ONE TABLET BY MOUTH EVERY DAY  30 tablet  2  . TAZTIA XT 180 MG 24 hr capsule TAKE ONE CAPSULE BY MOUTH EVERY DAY  30 each  2    BP 142/76  Pulse 54  Temp(Src) 97.6 F (36.4 C) (Oral)  Resp 16  Ht 5\' 10"  (1.778 m)  Wt 300 lb (136.079 kg)  BMI 43.05 kg/m2  SpO2 94%       Objective:   Physical Exam  Constitutional: He appears well-developed and well-nourished. No distress.  Cardiovascular: Normal rate and regular rhythm.   No murmur heard. Pulmonary/Chest: Effort normal and breath sounds normal. No respiratory  distress. He has no wheezes. He has no rales. He exhibits no tenderness.  Musculoskeletal: He exhibits no edema.  Psychiatric: He has a normal mood and affect. His behavior is normal. Judgment and thought content normal.          Assessment & Plan:

## 2011-03-30 NOTE — Assessment & Plan Note (Signed)
Reminded pt to avoid NSAIDS.  Obtain BMET.

## 2011-03-30 NOTE — Patient Instructions (Signed)
Please complete your blood work prior to leaving.  Follow up in 3 months, sooner if problems or concerns. 

## 2011-03-31 ENCOUNTER — Telehealth: Payer: Self-pay | Admitting: Family

## 2011-03-31 DIAGNOSIS — N289 Disorder of kidney and ureter, unspecified: Secondary | ICD-10-CM

## 2011-03-31 LAB — HEMOGLOBIN A1C: Mean Plasma Glucose: 143 mg/dL — ABNORMAL HIGH (ref <117)

## 2011-03-31 NOTE — Telephone Encounter (Signed)
Pls call pt and let him know that his kidney function has returned to normal.  He should restart cozaar and metformin.  Repeat bmet in 1 month (renal insufficiency).

## 2011-03-31 NOTE — Telephone Encounter (Signed)
Notified pt and he voices understanding. Future lab order placed and copy forwarded to the lab. Mailed copy to pt as appt reminder.

## 2011-04-13 ENCOUNTER — Other Ambulatory Visit: Payer: Self-pay | Admitting: Family

## 2011-04-15 ENCOUNTER — Encounter: Payer: Self-pay | Admitting: Family

## 2011-04-15 ENCOUNTER — Ambulatory Visit (INDEPENDENT_AMBULATORY_CARE_PROVIDER_SITE_OTHER): Payer: Medicare Other | Admitting: Family

## 2011-04-15 VITALS — BP 148/60 | HR 60 | Temp 97.8°F | Resp 16 | Ht 70.0 in | Wt 302.0 lb

## 2011-04-15 DIAGNOSIS — B353 Tinea pedis: Secondary | ICD-10-CM

## 2011-04-15 MED ORDER — DIAZEPAM 10 MG PO TABS: ORAL_TABLET | ORAL | Status: DC

## 2011-04-15 MED ORDER — HYDROCODONE-ACETAMINOPHEN 7.5-500 MG PO TABS: ORAL_TABLET | ORAL | Status: DC

## 2011-04-15 MED ORDER — FLUCONAZOLE 150 MG PO TABS: ORAL_TABLET | ORAL | Status: DC

## 2011-04-15 MED ORDER — CEPHALEXIN 500 MG PO CAPS: 500.0000 mg | ORAL_CAPSULE | Freq: Three times a day (TID) | ORAL | Status: AC

## 2011-04-15 NOTE — Assessment & Plan Note (Signed)
Pt with severe case of tinea pedis.  Will rx with diflucan once weekly x 3 weeks. Currently no open ulcers are noted.  Due to redness of the left foot, I will start keflex empirically for cellulitis. Follow up as outlined in AVS.

## 2011-04-15 NOTE — Patient Instructions (Signed)
Please follow up in 1 week.   Call if increased redness, swelling, pain or if you develop any open sores on your feet.

## 2011-04-15 NOTE — Progress Notes (Signed)
  Subjective:    Patient ID: Christian Morris, male    DOB: [birth year], 71 y.o.   MRN: [id number]  HPI  [name] is a 71 yr old male who presents today to discuss peeling skin and redness of his left foot.  Notes that about a week ago, he developed peeling skin on the sole of his left foot.  He admits that when it started peeling he "helped it along."  Since that time he notes some redness of the sole of his left foot.  Denies associated pain.    Review of Systems See HPI    Objective:   Physical Exam  Constitutional: He appears well-developed and well-nourished.  Skin:       Plantar aspect of the left foot with thick peeling skin noted.  New skin beneath is dark pink in color.  No significant swelling.  Mild left heel tenderness to palpation.           Assessment & Plan:

## 2011-04-22 ENCOUNTER — Encounter: Payer: Self-pay | Admitting: Family

## 2011-04-22 ENCOUNTER — Ambulatory Visit (INDEPENDENT_AMBULATORY_CARE_PROVIDER_SITE_OTHER): Payer: Medicare Other | Admitting: Family

## 2011-04-22 VITALS — BP 116/68 | HR 59 | Temp 97.8°F | Resp 16 | Ht 70.0 in | Wt 304.1 lb

## 2011-04-22 DIAGNOSIS — B353 Tinea pedis: Secondary | ICD-10-CM

## 2011-04-22 NOTE — Patient Instructions (Signed)
Complete the diflucan as ordered. Call if recurrent redness, swelling or pain in feet.

## 2011-04-22 NOTE — Assessment & Plan Note (Signed)
Improved.  I see no sign of infection at this time.  I advised pt to complete the diflucan and allow foot to peel naturally.  Pt instructed to call for follow up as outlined in AVS.

## 2011-04-22 NOTE — Progress Notes (Signed)
Subjective:    Patient ID: Christian Morris, male    DOB: Aug 02, 1940, 71 y.o.   MRN: [id number]  HPI  [name] is a 71 yr old male who presents today for follow up of his tinea pedis. He completed the keflex for mild associated skin infection.  He has taken two doses of the fluconazole so far.  He notes improvement in the left foot.  He does reports some dead skin on the right foot.     Review of Systems    see HPI  Past Medical History  Diagnosis Date  . Arthritis   . Depression   . Diabetes mellitus   . Hypertension   . Hyperlipidemia   . History of gastric ulcer   . Transfusion history     due to bleeding ulcer  . Hypogonadism male 01/11/2011  . Squamous cell carcinoma in situ of skin of forearm 01/23/2011    History   Social History  . Marital Status: Divorced    Spouse Name: N/A    Number of Children: N/A  . Years of Education: N/A   Occupational History  . Not on file.   Social History Main Topics  . Smoking status: Former Smoker    Types: Cigarettes    Quit date: 01/05/1982  . Smokeless tobacco: Not on file  . Alcohol Use: No  . Drug Use: No  . Sexually Active: Not on file   Other Topics Concern  . 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    Past Surgical History  Procedure Date  . Eye surgery 08/2009    cataract removal left eye--Dr Dione Booze    Family History  Problem Relation Age of Onset  . Arthritis Mother   . Heart disease Sister     MI  . Heart disease Brother     MI  . Heart disease Brother     MI    Allergies  Allergen Reactions  . Aspirin     REACTION: hx of ulcers    Current Outpatient Prescriptions on File Prior to Visit  Medication Sig Dispense Refill  . albuterol (PROAIR HFA) 108 (90 BASE) MCG/ACT inhaler Inhale 2 puffs into the lungs every 6 (six) hours as needed.  18 g  1  . aspirin 81 MG EC tablet Take 81 mg by mouth daily.        [name]  atorvastatin (LIPITOR) 40 MG tablet Take 1 tablet (40 mg total) by mouth daily.  30 tablet  2  . diazepam (VALIUM) 10 MG tablet One tablet by mouth every day as needed for anxiety.  30 tablet  0  . fexofenadine (ALLEGRA) 180 MG tablet Take 1 tablet (180 mg total) by mouth daily.      . fish oil-omega-3 fatty acids 1000 MG capsule Take 2 g by mouth 2 (two) times daily.        . fluconazole (DIFLUCAN) 150 MG tablet One tablet by mouth once weekly for 3 weeks.  3 tablet  0  . glucose blood (FREESTYLE LITE) test strip Use as instructed to check blood sugar once a day.  Dx 250.00  100 each  1  . hydrochlorothiazide (HYDRODIURIL) 25 MG tablet Take 1 tablet (25 mg total) by mouth daily.  30 tablet  5  . HYDROcodone-acetaminophen (LORTAB) 7.5-500 MG per tablet One tablet by mouth 3 times daily  90 tablet  0  .  losartan (COZAAR) 100 MG tablet One tablet by mouth daily  30 tablet  5  . metFORMIN (GLUCOPHAGE) 1000 MG tablet TAKE ONE TABLET BY MOUTH TWICE DAILY WITH MEALS  60 tablet  2  . omeprazole (PRILOSEC) 20 MG capsule Take 20 mg by mouth daily.        [name] PARoxetine (PAXIL) 30 MG tablet TAKE ONE TABLET BY MOUTH EVERY DAY  30 tablet  2  . TAZTIA XT 180 MG 24 hr capsule TAKE ONE CAPSULE BY MOUTH EVERY DAY  30 each  2  . cephALEXin (KEFLEX) 500 MG capsule Take 1 capsule (500 mg total) by mouth 3 (three) times daily.  21 capsule  0  . DISCONTD: metFORMIN (GLUCOPHAGE) 1000 MG tablet Take 1/2 tablet twice a day.  30 tablet  2    BP 116/68  Pulse 59  Temp(Src) 97.8 F (36.6 C) (Oral)  Resp 16  Ht 5\' 10"  (1.778 m)  Wt 304 lb 1.3 oz (137.93 kg)  BMI 43.63 kg/m2  SpO2 96%    Objective:   Physical Exam  Constitutional: He appears well-developed and well-nourished. No distress.  HENT:  Head: Normocephalic and atraumatic.  Cardiovascular: Normal rate and regular rhythm.   No murmur heard. Pulmonary/Chest: Effort normal and breath sounds normal.  Skin:       left foot- resolution of erythema,  improvement in peeling skin.  R foot, no erythema.  A layer of dead skin is noted overlying the right arch but remains intact.   Psychiatric: He has a normal mood and affect. His behavior is normal. Judgment and thought content normal.          Assessment & Plan:

## 2011-04-28 ENCOUNTER — Telehealth: Payer: Self-pay | Admitting: Family

## 2011-04-28 NOTE — Telephone Encounter (Signed)
Left message on home # to return my call. 

## 2011-04-28 NOTE — Telephone Encounter (Signed)
Stop glimepiride please.

## 2011-04-28 NOTE — Telephone Encounter (Signed)
Christian Morris, it looks like glimepiride fell off pt's med list in March. Last refill we gave was in February. Please advise if pt should still be taking this medication?

## 2011-04-28 NOTE — Telephone Encounter (Signed)
Refill-glimepiride 4mg  tab. Take one tablet by mouth every day. Qty 30 last fill 5.12.12

## 2011-04-28 NOTE — Telephone Encounter (Signed)
Patient returned phone call. Best# [phone]

## 2011-04-28 NOTE — Telephone Encounter (Signed)
Notified pt and he voices understanding. 

## 2011-05-07 ENCOUNTER — Telehealth: Payer: Self-pay | Admitting: Family

## 2011-05-07 NOTE — Telephone Encounter (Signed)
Spoke to pt and verified that he is not taking this medication and did not request refill.  Spoke with pharmacist and asked him to deactivate rx on pt's profile. Med has been deactivated.

## 2011-05-14 ENCOUNTER — Telehealth: Payer: Self-pay | Admitting: *Deleted

## 2011-05-14 MED ORDER — HYDROCODONE-ACETAMINOPHEN 7.5-500 MG PO TABS: ORAL_TABLET | ORAL | Status: DC

## 2011-05-14 MED ORDER — DIAZEPAM 10 MG PO TABS: ORAL_TABLET | ORAL | Status: DC

## 2011-05-14 NOTE — Telephone Encounter (Signed)
Received call from pt requesting refills of diazepam and hydrocodone-acet. Refills called to [facility] voicemail, 1 month supply x no refills each.

## 2011-06-01 ENCOUNTER — Other Ambulatory Visit: Payer: Self-pay | Admitting: Family

## 2011-06-02 ENCOUNTER — Telehealth: Payer: Self-pay | Admitting: *Deleted

## 2011-06-02 ENCOUNTER — Other Ambulatory Visit: Payer: Self-pay | Admitting: Family

## 2011-06-02 NOTE — Telephone Encounter (Signed)
Received message from pt that he went to pick up Rxs today and was told something was wrong with our transmission and we should contact the pharmacy. Spoke to pharmacy and verified that they received both refills of Paroxetine and atorvastatin. Notified pt.

## 2011-06-05 ENCOUNTER — Other Ambulatory Visit: Payer: Self-pay | Admitting: Family

## 2011-06-05 NOTE — Telephone Encounter (Signed)
Please see Rx warnings for taztia and atorvastatin.

## 2011-06-08 ENCOUNTER — Telehealth: Payer: Self-pay | Admitting: Family

## 2011-06-08 MED ORDER — DILTIAZEM HCL ER BEADS 180 MG PO CP24: 180.0000 mg | ORAL_CAPSULE | Freq: Every day | ORAL | Status: DC

## 2011-06-08 NOTE — Telephone Encounter (Signed)
OK to continue for now

## 2011-06-08 NOTE — Telephone Encounter (Signed)
Refill- diltiazem 180mg /24 cap. Take one capsule by mouth every day. Qty 30 last fill 6.1.13

## 2011-06-11 ENCOUNTER — Telehealth: Payer: Self-pay | Admitting: Family

## 2011-06-11 NOTE — Telephone Encounter (Signed)
Patient Called needs his medication refilled      Diazepam 10mg    And Lortab  7.5-500mg 

## 2011-06-12 MED ORDER — HYDROCODONE-ACETAMINOPHEN 7.5-500 MG PO TABS: ORAL_TABLET | ORAL | Status: DC

## 2011-06-12 MED ORDER — DIAZEPAM 10 MG PO TABS: ORAL_TABLET | ORAL | Status: DC

## 2011-06-12 NOTE — Telephone Encounter (Signed)
Refills left on pharmacy voicemail #30 diazepam and #90 Lortab x no refills each. Pt notified.

## 2011-06-15 ENCOUNTER — Ambulatory Visit (HOSPITAL_BASED_OUTPATIENT_CLINIC_OR_DEPARTMENT_OTHER)
Admission: RE | Admit: 2011-06-15 | Discharge: 2011-06-15 | Disposition: A | Discharge status: Home / Self Care | Payer: Medicare Other | Source: Ambulatory Visit | Attending: Family | Admitting: Family

## 2011-06-15 ENCOUNTER — Encounter: Payer: Self-pay | Admitting: Family

## 2011-06-15 ENCOUNTER — Ambulatory Visit (INDEPENDENT_AMBULATORY_CARE_PROVIDER_SITE_OTHER): Payer: Medicare Other | Admitting: Family

## 2011-06-15 VITALS — BP 136/76 | HR 56 | Temp 97.7°F | Resp 18 | Ht 70.0 in | Wt 306.0 lb

## 2011-06-15 DIAGNOSIS — R0789 Other chest pain: Secondary | ICD-10-CM

## 2011-06-15 DIAGNOSIS — M7918 Myalgia, other site: Secondary | ICD-10-CM

## 2011-06-15 DIAGNOSIS — R071 Chest pain on breathing: Secondary | ICD-10-CM

## 2011-06-15 DIAGNOSIS — R5383 Other fatigue: Secondary | ICD-10-CM | POA: Insufficient documentation

## 2011-06-15 DIAGNOSIS — R079 Chest pain, unspecified: Secondary | ICD-10-CM | POA: Insufficient documentation

## 2011-06-15 DIAGNOSIS — G4733 Obstructive sleep apnea (adult) (pediatric): Secondary | ICD-10-CM

## 2011-06-15 DIAGNOSIS — R5381 Other malaise: Secondary | ICD-10-CM

## 2011-06-15 DIAGNOSIS — R0602 Shortness of breath: Secondary | ICD-10-CM | POA: Insufficient documentation

## 2011-06-15 DIAGNOSIS — E291 Testicular hypofunction: Secondary | ICD-10-CM

## 2011-06-15 DIAGNOSIS — IMO0001 Reserved for inherently not codable concepts without codable children: Secondary | ICD-10-CM

## 2011-06-15 DIAGNOSIS — L299 Pruritus, unspecified: Secondary | ICD-10-CM | POA: Insufficient documentation

## 2011-06-15 LAB — TSH: TSH: 0.55 u[IU]/mL (ref 0.350–4.500)

## 2011-06-15 MED ORDER — LIDOCAINE 5 % EX PTCH: 1.0000 | MEDICATED_PATCH | CUTANEOUS | Status: DC

## 2011-06-15 NOTE — Assessment & Plan Note (Signed)
Likely related to osa.  Check CBC, TSH.  Refer to pulmonary for further management of his CPAP and settings.

## 2011-06-15 NOTE — Assessment & Plan Note (Signed)
Will obtain rib film and cxr, but I suspect these will be normal.  Trial of lidoderm patch.  Pt to call if symptoms worsen, or if not feeling better in 1-2 weeks.

## 2011-06-15 NOTE — Progress Notes (Signed)
Subjective:    Patient ID: Christian Morris, male    DOB: 12-11-1940, 71 y.o.   MRN: [id number]  HPI  [name] is a 71 yr old male who presents today with complaint of pain beneath the left rib. Pain started about 1-2 weeks ago.  Reports that he has had this happen several times before.  Has been told that his pain was pleurisy.  He denies cough, fever.  Pain improves with sitting up straight. Worsens by sitting forward and  worsens  as the day wears on.  Symptom is not made worse by a deep breath or with activity.     Notes some skin itching.  Improved with allegra.    Poor energy- notes that he feels more tired during the day. Attributes this to his sleep apnea.  He was walking 5 miles a day, but now only walking 2 miles a day.  Review of Systems See HPI  Past Medical History  Diagnosis Date  . Arthritis   . Depression   . Diabetes mellitus   . Hypertension   . Hyperlipidemia   . History of gastric ulcer   . Transfusion history     due to bleeding ulcer  . Hypogonadism male 01/11/2011  . Squamous cell carcinoma in situ of skin of forearm 01/23/2011    History   Social History  . Marital Status: Divorced    Spouse Name: N/A    Number of Children: N/A  . Years of Education: N/A   Occupational History  . Not on file.   Social History Main Topics  . Smoking status: Former Smoker    Types: Cigarettes    Quit date: 01/05/1982  . Smokeless tobacco: Not on file  . Alcohol Use: No  . Drug Use: No  . Sexually Active: Not on file   Other Topics Concern  . 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    Past Surgical History  Procedure Date  . Eye surgery 08/2009    cataract removal left eye--Dr Christian Morris    Family History  Problem Relation Age of Onset  . Arthritis Mother   . Heart disease Sister     MI  . Heart disease Brother     MI  . Heart disease Brother     MI    Allergies  Allergen  Reactions  . Aspirin     REACTION: hx of ulcers    Current Outpatient Prescriptions on File Prior to Visit  Medication Sig Dispense Refill  . albuterol (PROAIR HFA) 108 (90 BASE) MCG/ACT inhaler Inhale 2 puffs into the lungs every 6 (six) hours as needed.  18 g  1  . aspirin 81 MG EC tablet Take 81 mg by mouth daily.        [name] atorvastatin (LIPITOR) 40 MG tablet TAKE ONE TABLET BY MOUTH EVERY DAY  30 tablet  3  . diazepam (VALIUM) 10 MG tablet One tablet by mouth every day as needed for anxiety.  30 tablet  0  . diltiazem (TAZTIA XT) 180 MG 24 hr capsule Take 1 capsule (180 mg total) by mouth daily.  30 capsule  1  . fexofenadine (ALLEGRA) 180 MG tablet Take 1 tablet (180 mg total) by mouth daily.      . fish oil-omega-3 fatty acids 1000 MG capsule Take 2 g by mouth 2 (two) times daily.        [name]  fluconazole (DIFLUCAN) 150 MG tablet One tablet by mouth once weekly for 3 weeks.  3 tablet  0  . glucose blood (FREESTYLE LITE) test strip Use as instructed to check blood sugar once a day.  Dx 250.00  100 each  1  . hydrochlorothiazide (HYDRODIURIL) 25 MG tablet Take 1 tablet (25 mg total) by mouth daily.  30 tablet  5  . HYDROcodone-acetaminophen (LORTAB) 7.5-500 MG per tablet One tablet by mouth 3 times daily  90 tablet  0  . losartan (COZAAR) 100 MG tablet One tablet by mouth daily  30 tablet  5  . metFORMIN (GLUCOPHAGE) 1000 MG tablet TAKE ONE TABLET BY MOUTH TWICE DAILY WITH MEALS  60 tablet  2  . omeprazole (PRILOSEC) 20 MG capsule Take 20 mg by mouth daily.        [name] PARoxetine (PAXIL) 30 MG tablet TAKE ONE TABLET BY MOUTH EVERY DAY  30 tablet  2  . DISCONTD: metFORMIN (GLUCOPHAGE) 1000 MG tablet Take 1/2 tablet twice a day.  30 tablet  2    BP 136/76  Pulse 56  Temp(Src) 97.7 F (36.5 C) (Oral)  Resp 18  Ht 5\' 10"  (1.778 m)  Wt 306 lb (138.801 kg)  BMI 43.91 kg/m2  SpO2 97%       Objective:   Physical Exam  Constitutional: He appears well-developed and well-nourished. No  distress.  Cardiovascular: Normal rate and regular rhythm.   No murmur heard. Pulmonary/Chest: Effort normal and breath sounds normal. No respiratory distress. He has no wheezes. He has no rales. He exhibits no tenderness.  Abdominal: Soft. Bowel sounds are normal.       + tenderness to palpation of left abdominal wall/left anterior rib cage.           Assessment & Plan:

## 2011-06-15 NOTE — Patient Instructions (Signed)
Please complete your chest x-ray on the first floor.  Call if symptoms worsen, or if not resolved in 1-2 weeks.

## 2011-06-15 NOTE — Assessment & Plan Note (Signed)
Resolves with Allegra.  Likely allergy related.  Recommended that he continue allegra.

## 2011-06-16 ENCOUNTER — Telehealth: Payer: Self-pay | Admitting: Family

## 2011-06-16 LAB — TESTOSTERONE, FREE, TOTAL, SHBG
Testosterone, Free: 39.7 pg/mL — ABNORMAL LOW (ref 47.0–244.0)
Testosterone-% Free: 2.2 % (ref 1.6–2.9)
Testosterone: 176.94 ng/dL — ABNORMAL LOW (ref 300–890)

## 2011-06-16 NOTE — Telephone Encounter (Signed)
Notified pt. He states he was unable to get rx due to cost (>$100).

## 2011-06-16 NOTE — Telephone Encounter (Signed)
Left message requesting that pt return our call. When he calls back, please let him know that his testosterone remains low. This could be contributing to his fatigue.  I see that the urologist prescribed him Axiron testosterone supplement last January.  Was there a reason that he did not start this? I think he should start it if able.

## 2011-06-16 NOTE — Telephone Encounter (Signed)
Received call from pt stating he just received a call re: appt on 07/10/11. Thinks it is about the rib pain he had been having. Reports resolution of pain and doesn't need to see anyone about it. Advised pt we did not refer him to anyone re: rib pain and questioned if it was the for sleep study. Pt states he doesn't know who contacted him. Advised pt I will look into this and call him back.

## 2011-06-16 NOTE — Telephone Encounter (Signed)
Appt was for consultation with Dr Shelle Iron for sleep study.  Advised pt that he needs to keep appt as we have no record of previous sleep study and last study was 3-4 years ago. Advised pt that test should be performed to ensure his settings are at proper rate due to his increased fatigue. Pt voices understanding and appt r/s for 07/10/11 at 2pm. Appt info. Given to pt.

## 2011-06-23 ENCOUNTER — Ambulatory Visit: Payer: Medicare Other | Admitting: Family

## 2011-07-06 ENCOUNTER — Telehealth: Payer: Self-pay | Admitting: Family

## 2011-07-06 NOTE — Telephone Encounter (Signed)
Patient states that he has been having left heel pain for the past week. His heel hurts when he steps on it, sore underneath the skin, and is hot to the touch. Please assist.

## 2011-07-06 NOTE — Telephone Encounter (Signed)
Notified pt and scheduled appt for tomorrow at 10:30.

## 2011-07-06 NOTE — Telephone Encounter (Signed)
Pt needs OV please 

## 2011-07-07 ENCOUNTER — Encounter: Payer: Self-pay | Admitting: Family

## 2011-07-07 ENCOUNTER — Ambulatory Visit (INDEPENDENT_AMBULATORY_CARE_PROVIDER_SITE_OTHER): Payer: Medicare Other | Admitting: Family

## 2011-07-07 VITALS — BP 130/74 | HR 55 | Temp 97.9°F | Resp 18

## 2011-07-07 DIAGNOSIS — M722 Plantar fascial fibromatosis: Secondary | ICD-10-CM

## 2011-07-07 MED ORDER — CEPHALEXIN 500 MG PO CAPS: 500.0000 mg | ORAL_CAPSULE | Freq: Three times a day (TID) | ORAL | Status: AC

## 2011-07-07 NOTE — Progress Notes (Signed)
  Subjective:    Patient ID: Christian Morris, male    DOB: June 21, 1940, 71 y.o.   MRN: [id number]  HPI  [name] is a 71 yr old male who presents today with chief complaint of left heel pain.  Reports that it started acting up a few weeks ago.  Red/warm to touch. Worsens with weight bearing.  Feels better "as the day goes on." He continues his scheduled lortabs with minimal improvement.    Review of Systems    see HPI Objective:   Physical Exam  Constitutional: He appears well-developed and well-nourished. No distress.  Musculoskeletal:       L heel is tender to the touch, mild associated erythema of heel/arch without significant warmth or swelling.           Assessment & Plan:

## 2011-07-07 NOTE — Patient Instructions (Addendum)
Please call if your symptoms worsen, or if no improvement in 2-3 days. Ice your foot 2-3 times a day. Continue lortab as needed for pain.

## 2011-07-07 NOTE — Assessment & Plan Note (Signed)
Deteriorated.  I recommended that he ice the area several times a day.  Will also treat empirically with keflex due to erythema and hx of DM.  He knows to call us if symptoms worsen, or if no improvement.  Pt verbalizes understanding.

## 2011-07-10 ENCOUNTER — Institutional Professional Consult (permissible substitution): Payer: Medicare Other | Admitting: Pulmonary Disease

## 2011-07-10 ENCOUNTER — Encounter: Payer: Self-pay | Admitting: Pulmonary Disease

## 2011-07-10 ENCOUNTER — Other Ambulatory Visit: Payer: Self-pay | Admitting: *Deleted

## 2011-07-10 ENCOUNTER — Ambulatory Visit (INDEPENDENT_AMBULATORY_CARE_PROVIDER_SITE_OTHER): Payer: Medicare Other | Admitting: Pulmonary Disease

## 2011-07-10 VITALS — BP 122/60 | HR 66 | Temp 98.3°F | Ht 72.0 in | Wt 305.6 lb

## 2011-07-10 DIAGNOSIS — G4733 Obstructive sleep apnea (adult) (pediatric): Secondary | ICD-10-CM

## 2011-07-10 MED ORDER — DIAZEPAM 10 MG PO TABS: ORAL_TABLET | ORAL | Status: DC

## 2011-07-10 MED ORDER — HYDROCODONE-ACETAMINOPHEN 7.5-500 MG PO TABS: ORAL_TABLET | ORAL | Status: DC

## 2011-07-10 NOTE — Progress Notes (Signed)
  Subjective:    Patient ID: Christian Morris, male    DOB: [birth year], 71 y.o.   MRN: [id number]  HPI The patient is a 71 year old male who I've been asked to see for management of obstructive sleep apnea.  The patient was diagnosed in 2010 with moderate sleep apnea, with an AHI of 33 events per hour and desaturation as low as 73%.  He was then placed on CPAP and titrated to an optimal pressure of 17 cm of water.  He has been on this since that time, and feels that he is doing well.  He has been compliant with CPAP, and denies any issues with the mask fit or pressure.  He is due for a new mask and supplies, but feels his machine is working properly.  He is not having breakthrough snoring, and feels rested the following morning.  The patient feels that his daytime alertness is adequate with inactivity, and denies any sleepiness with driving.  His weight is neutral over the last 2 years, and his Epworth score today is 10  Sleep Questionnaire: What time do you typically go to bed?( Between what hours) 8 p, How long does it take you to fall asleep? not long How many times during the night do you wake up? 4 What time do you get out of bed to start your day? 0630 Do you drive or operate heavy machinery in your occupation? No How much has your weight changed (up or down) over the past two years? (In pounds) 0 oz (0 kg) Have you ever had a sleep study before? Yes If yes, location of study? [place] If yes, date of study? Do you currently use CPAP? Yes If so, what pressure? 17 Do you wear oxygen at any time? No     Review of Systems  Constitutional: Negative for fever and unexpected weight change.  HENT: Positive for congestion, sneezing and dental problem. Negative for ear pain, nosebleeds, sore throat, rhinorrhea, trouble swallowing, postnasal drip and sinus pressure.   Eyes: Negative for redness and itching.  Respiratory: Positive for shortness of breath. Negative for cough, chest tightness and wheezing.     Cardiovascular: Negative for palpitations and leg swelling.  Gastrointestinal: Negative for nausea and vomiting.  Genitourinary: Negative for dysuria.  Musculoskeletal: Positive for arthralgias. Negative for joint swelling.  Skin: Negative for rash.  Neurological: Negative for headaches.  Hematological: Does not bruise/bleed easily.  Psychiatric/Behavioral: Negative for dysphoric mood. The patient is not nervous/anxious.   All other systems reviewed and are negative.       Objective:   Physical Exam Constitutional:  Morbidly obese, no acute distress  HENT:  Nares patent without discharge  Oropharynx without exudate, palate and uvula are thick and elongated.  +soft tissue redundancy  Eyes:  Perrla, eomi, no scleral icterus  Neck:  No JVD, no TMG  Cardiovascular:  Normal rate, regular rhythm, no rubs or gallops.  3/6 sem        Intact distal pulses but decreased.   Pulmonary :  Normal breath sounds, no stridor or respiratory distress   No rales, rhonchi, or wheezing  Abdominal:  Soft, nondistended, bowel sounds present.  No tenderness noted.   Musculoskeletal:  1+ lower extremity edema noted.  Lymph Nodes:  No cervical lymphadenopathy noted  Skin:  No cyanosis noted  Neurologic:  Alert, appropriate, moves all 4 extremities without obvious deficit.         Assessment & Plan:

## 2011-07-10 NOTE — Assessment & Plan Note (Signed)
The patient has a history of moderate obstructive sleep apnea that has been treated successfully with CPAP.  He is wearing the device compliantly, and feels that it has helped his sleep and daytime alertness.  It appears that he is due for a new mask and supplies, and will make the referral to his DME.  I have also encouraged him to work aggressively on weight loss, and to let me know if he has any issues from a sleep apnea standpoint.  If he is doing well, will followup in one year.

## 2011-07-10 NOTE — Patient Instructions (Addendum)
Will get you a new mask, and make sure supplies and filters are up to date Work on weight loss If doing well, followup with me in one year.

## 2011-07-10 NOTE — Telephone Encounter (Signed)
Pt called requesting refill of diazepam and lortab. Meds last filled on 06/12/11. Refills called to pharmacy voicemail, 1 month supply x no refills each. Pt notified.

## 2011-07-13 ENCOUNTER — Other Ambulatory Visit: Payer: Self-pay | Admitting: Family

## 2011-07-21 ENCOUNTER — Telehealth: Payer: Self-pay | Admitting: *Deleted

## 2011-07-21 MED ORDER — SILDENAFIL CITRATE 100 MG PO TABS: 100.0000 mg | ORAL_TABLET | Freq: Every day | ORAL | Status: DC | PRN

## 2011-07-21 NOTE — Telephone Encounter (Signed)
Rx sent 

## 2011-07-21 NOTE — Telephone Encounter (Signed)
Received call from pt requesting refill of Viagra 100mg . He states his old Rx has expired.  Please advise re: quantity and refills.

## 2011-07-22 ENCOUNTER — Encounter: Payer: Self-pay | Admitting: Family

## 2011-07-22 ENCOUNTER — Ambulatory Visit (INDEPENDENT_AMBULATORY_CARE_PROVIDER_SITE_OTHER): Payer: Medicare Other | Admitting: Family

## 2011-07-22 VITALS — BP 116/72 | HR 63 | Temp 98.0°F | Resp 16 | Wt 304.1 lb

## 2011-07-22 DIAGNOSIS — M722 Plantar fascial fibromatosis: Secondary | ICD-10-CM

## 2011-07-22 NOTE — Telephone Encounter (Signed)
Pt notified at appt today

## 2011-07-22 NOTE — Patient Instructions (Addendum)
Ice your foot every night. Call if symptoms worsen or if no improvement.

## 2011-07-22 NOTE — Assessment & Plan Note (Signed)
Unfortunately, he cannot take nsaids due to PUD and DM2.  Recommended that he ice the left foot every evening.   Offered x-ray of foot and referral to podiatry. He declines at this time.

## 2011-07-22 NOTE — Progress Notes (Signed)
  Subjective:    Patient ID: Christian Morris, male    DOB: Dec 09, 1940, 71 y.o.   MRN: [id number]  HPI  Christian Morris is a 71 yr old male who presents today for follow up of his left foot pain.  He reports that he continues to have pain in the left heel.  Completed antibiotics.  Overall pain is improved.  Pain is worst first thing in the morning. He has not been regularly icing the affected area.  Review of Systems See HPI    Objective:   Physical Exam  Constitutional: He appears well-developed and well-nourished. No distress.  Musculoskeletal:       + tenderness to palpation of left heel/left arch.  No warmth or unusual redness is noted.           Assessment & Plan:

## 2011-07-27 ENCOUNTER — Telehealth: Payer: Self-pay | Admitting: Family

## 2011-07-27 ENCOUNTER — Telehealth: Payer: Self-pay | Admitting: *Deleted

## 2011-07-27 ENCOUNTER — Ambulatory Visit (HOSPITAL_BASED_OUTPATIENT_CLINIC_OR_DEPARTMENT_OTHER)
Admission: RE | Admit: 2011-07-27 | Discharge: 2011-07-27 | Disposition: A | Discharge status: Home / Self Care | Payer: Medicare Other | Source: Ambulatory Visit | Attending: Family | Admitting: Family

## 2011-07-27 DIAGNOSIS — M7730 Calcaneal spur, unspecified foot: Secondary | ICD-10-CM | POA: Insufficient documentation

## 2011-07-27 DIAGNOSIS — M79672 Pain in left foot: Secondary | ICD-10-CM

## 2011-07-27 DIAGNOSIS — M79609 Pain in unspecified limb: Secondary | ICD-10-CM | POA: Insufficient documentation

## 2011-07-27 NOTE — Telephone Encounter (Signed)
Pls call pt and let him know that heel looks ok on x-ray, but they did note what looks like possible glass fragment within his foot at the base of the third toe.  I would like for him to see podiatry.

## 2011-07-27 NOTE — Telephone Encounter (Signed)
Notified pt and he is agreeable to proceed with referral. Pt has been made aware that xray did note some spurs on his heel which will also be addressed by the podiatrist. Advised pt to let us know if he hasn't been contacted re: appt within 1 week.

## 2011-07-27 NOTE — Telephone Encounter (Signed)
Pt called stating he continues to have left heel pain and wants to know what he can do. Advised pt per last office note that he would not be able to take NSAIDS due to hx of PUD and DM2. Pt reports that he has not been icing his heel every evening as instructed. Pt wishes to proceed with heel xray. Notified Provider and entered order. Pt will go to xray dept today and await results.

## 2011-07-31 ENCOUNTER — Other Ambulatory Visit: Payer: Self-pay | Admitting: *Deleted

## 2011-07-31 MED ORDER — SILDENAFIL CITRATE 100 MG PO TABS: 100.0000 mg | ORAL_TABLET | Freq: Every day | ORAL | Status: DC | PRN

## 2011-07-31 MED ORDER — HYDROCHLOROTHIAZIDE 25 MG PO TABS: 25.0000 mg | ORAL_TABLET | Freq: Every day | ORAL | Status: DC

## 2011-07-31 MED ORDER — PAROXETINE HCL 30 MG PO TABS: 30.0000 mg | ORAL_TABLET | Freq: Every day | ORAL | Status: DC

## 2011-07-31 MED ORDER — DILTIAZEM HCL ER BEADS 180 MG PO CP24: 180.0000 mg | ORAL_CAPSULE | Freq: Every day | ORAL | Status: DC

## 2011-07-31 MED ORDER — LOSARTAN POTASSIUM 100 MG PO TABS: ORAL_TABLET | ORAL | Status: DC

## 2011-07-31 MED ORDER — ATORVASTATIN CALCIUM 40 MG PO TABS: 40.0000 mg | ORAL_TABLET | Freq: Every day | ORAL | Status: DC

## 2011-07-31 MED ORDER — METFORMIN HCL 1000 MG PO TABS: ORAL_TABLET | ORAL | Status: DC

## 2011-07-31 NOTE — Telephone Encounter (Signed)
OK to send #30 tabs viagra to mail order with 1 refill if he would like. Otherwise 10 tabs with 5 refills to local pharmacy.

## 2011-07-31 NOTE — Telephone Encounter (Signed)
Pt brought letter to the office from [facility] and requests that we send refills for all medications to [facility] mail order. Advised pt we will not be able to send refills of Valium and Lortab as they are controlled substances and we only do 30 day supply at a time for those. He voices understanding and will continue to fill those locally. Refills sent to [facility] for: atorvastatin, diltiazem, HCTZ, losartan, metformin and paxil. Please advise re: viagra.

## 2011-07-31 NOTE — Telephone Encounter (Signed)
Refill sent.

## 2011-07-31 NOTE — Telephone Encounter (Signed)
Received call from pt stating he received a letter from his insurance company re: using mail order service for prescription refills. Pt does not know the name of mail order service. Advised pt to bring a copy of the letter to our office for review.

## 2011-08-03 ENCOUNTER — Other Ambulatory Visit: Payer: Self-pay | Admitting: Family

## 2011-08-03 NOTE — Telephone Encounter (Signed)
Verified with pt that he needs 30 day supply of hctz and diltiazem sent to [facility] until mail order supply is received. #30 each x no refills sent to [facility].

## 2011-08-07 ENCOUNTER — Telehealth: Payer: Self-pay | Admitting: *Deleted

## 2011-08-07 MED ORDER — DIAZEPAM 10 MG PO TABS: ORAL_TABLET | ORAL | Status: DC

## 2011-08-07 MED ORDER — HYDROCODONE-ACETAMINOPHEN 7.5-500 MG PO TABS: ORAL_TABLET | ORAL | Status: DC

## 2011-08-07 NOTE — Telephone Encounter (Signed)
Pt called requesting refill of diazepam and hydrocodone. Refills called to [facility] voicemail, diazepam #30 x no refills, hydrocodone #90 x no refills.

## 2011-08-24 ENCOUNTER — Ambulatory Visit (HOSPITAL_BASED_OUTPATIENT_CLINIC_OR_DEPARTMENT_OTHER)
Admission: RE | Admit: 2011-08-24 | Discharge: 2011-08-24 | Disposition: A | Discharge status: Home / Self Care | Payer: Medicare Other | Source: Ambulatory Visit | Attending: Family | Admitting: Family

## 2011-08-24 ENCOUNTER — Encounter: Payer: Self-pay | Admitting: Family

## 2011-08-24 ENCOUNTER — Ambulatory Visit (INDEPENDENT_AMBULATORY_CARE_PROVIDER_SITE_OTHER): Payer: Medicare Other | Admitting: Family

## 2011-08-24 VITALS — BP 110/76 | HR 63 | Temp 98.0°F | Resp 18 | Wt 292.8 lb

## 2011-08-24 DIAGNOSIS — M545 Low back pain: Secondary | ICD-10-CM

## 2011-08-24 DIAGNOSIS — R319 Hematuria, unspecified: Secondary | ICD-10-CM | POA: Insufficient documentation

## 2011-08-24 DIAGNOSIS — N400 Enlarged prostate without lower urinary tract symptoms: Secondary | ICD-10-CM | POA: Insufficient documentation

## 2011-08-24 DIAGNOSIS — K7689 Other specified diseases of liver: Secondary | ICD-10-CM | POA: Insufficient documentation

## 2011-08-24 DIAGNOSIS — Z87442 Personal history of urinary calculi: Secondary | ICD-10-CM | POA: Insufficient documentation

## 2011-08-24 DIAGNOSIS — R109 Unspecified abdominal pain: Secondary | ICD-10-CM | POA: Insufficient documentation

## 2011-08-24 DIAGNOSIS — I700 Atherosclerosis of aorta: Secondary | ICD-10-CM | POA: Insufficient documentation

## 2011-08-24 DIAGNOSIS — M7918 Myalgia, other site: Secondary | ICD-10-CM

## 2011-08-24 DIAGNOSIS — IMO0001 Reserved for inherently not codable concepts without codable children: Secondary | ICD-10-CM

## 2011-08-24 LAB — POCT URINALYSIS DIPSTICK
Glucose, UA: NEGATIVE
Leukocytes, UA: NEGATIVE
Nitrite, UA: NEGATIVE
Urobilinogen, UA: 0.2

## 2011-08-24 NOTE — Patient Instructions (Addendum)
Please complete your CT scan on the first floor.  Follow up in 2 weeks.

## 2011-08-24 NOTE — Progress Notes (Signed)
Subjective:    Patient ID: Christian Morris, male    DOB: 03-06-1940, 71 y.o.   MRN: [id number]  HPI  Mr.  Steinke is a 71 yr old male who presents today with chief complaint of left flank/abdominal pain.  Reports pain is located in the left upper abdominal pain, radiates around to the back.  Present x 1 month.  No improvement despite his chronic pain meds.  Denies associated fever,  hematuria,  Dysuria, nausea, or skin rash.     Review of Systems See HPI  Past Medical History  Diagnosis Date  . Arthritis   . Depression   . Diabetes mellitus   . Hypertension   . Hyperlipidemia   . History of gastric ulcer   . Transfusion history     due to bleeding ulcer  . Hypogonadism male 01/11/2011  . Squamous cell carcinoma in situ of skin of forearm 01/23/2011  . Lung nodule     right 16 mm, seen initially 6/12    History   Social History  . Marital Status: Divorced    Spouse Name: N/A    Number of Children: N/A  . Years of Education: N/A   Occupational History  . RETIRED BUS DRIVER    Social History Main Topics  . Smoking status: Former Smoker -- 1.0 packs/day for 25 years    Types: Cigarettes    Quit date: 01/05/1982  . Smokeless tobacco: Not on file  . Alcohol Use: No  . Drug Use: No  . Sexually Active: Not on file   Other Topics Concern  . 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    Past Surgical History  Procedure Date  . Eye surgery 08/2009    cataract removal left eye--Dr Dione Booze  . Melanoma excision     left forearm    Family History  Problem Relation Age of Onset  . Arthritis Mother   . Heart disease Sister     MI  . Heart disease Brother     MI  . Heart disease Brother     MI  . Melanoma Son     Allergies  Allergen Reactions  . Aspirin     REACTION: hx of ulcers    Current Outpatient Prescriptions on File Prior to Visit  Medication Sig Dispense Refill  . aspirin 81 MG  EC tablet Take 81 mg by mouth daily.        [name] atorvastatin (LIPITOR) 40 MG tablet Take 1 tablet (40 mg total) by mouth daily.  90 tablet  0  . diazepam (VALIUM) 10 MG tablet One tablet by mouth every day as needed for anxiety.  30 tablet  0  . diltiazem (CARDIZEM CD) 180 MG 24 hr capsule TAKE ONE CAPSULE BY MOUTH EVERY DAY  30 capsule  0  . diltiazem (TAZTIA XT) 180 MG 24 hr capsule Take 1 capsule (180 mg total) by mouth daily.  90 capsule  0  . fexofenadine (ALLEGRA) 180 MG tablet Take 1 tablet (180 mg total) by mouth daily.      . fish oil-omega-3 fatty acids 1000 MG capsule Take 2 g by mouth 2 (two) times daily.        [name] glucose blood (FREESTYLE LITE) test strip Use as instructed to check blood sugar once a day.  Dx 250.00  100 each  1  . hydrochlorothiazide (HYDRODIURIL) 25 MG tablet Take  1 tablet (25 mg total) by mouth daily.  90 tablet  0  . hydrochlorothiazide (HYDRODIURIL) 25 MG tablet TAKE ONE TABLET BY MOUTH EVERY DAY  30 tablet  0  . HYDROcodone-acetaminophen (LORTAB) 7.5-500 MG per tablet One tablet by mouth 3 times daily  90 tablet  0  . losartan (COZAAR) 100 MG tablet One tablet by mouth daily  90 tablet  0  . metFORMIN (GLUCOPHAGE) 1000 MG tablet Take 1 tablet by mouth twice a day with meals.  180 tablet  0  . omeprazole (PRILOSEC) 20 MG capsule Take 20 mg by mouth daily.        [name] PARoxetine (PAXIL) 30 MG tablet Take 1 tablet (30 mg total) by mouth daily.  90 tablet  0  . sildenafil (VIAGRA) 100 MG tablet Take 1 tablet (100 mg total) by mouth daily as needed for erectile dysfunction.  30 tablet  1  . DISCONTD: metFORMIN (GLUCOPHAGE) 1000 MG tablet Take 1/2 tablet twice a day.  30 tablet  2    BP 110/76  Pulse 63  Temp 98 F (36.7 C) (Oral)  Resp 18  Wt 292 lb 12 oz (132.791 kg)  SpO2 94%       Objective:   Physical Exam  Constitutional: He appears well-developed and well-nourished. No distress.  HENT:  Head: Normocephalic and atraumatic.  Cardiovascular: Normal rate  and regular rhythm.   No murmur heard. Pulmonary/Chest: Effort normal and breath sounds normal. No respiratory distress. He has no wheezes. He has no rales. He exhibits no tenderness.  Abdominal:       Left lateral abdominal pain with palpation, no guarding.  Musculoskeletal: He exhibits no edema.  Skin: Skin is warm and dry.  Psychiatric: He has a normal mood and affect. His behavior is normal. Judgment and thought content normal.          Assessment & Plan:

## 2011-08-25 ENCOUNTER — Ambulatory Visit: Payer: Medicare Other | Admitting: Family

## 2011-08-26 NOTE — Assessment & Plan Note (Signed)
CT abdomen and pelvis is performed to exclude stone.  Only punctate non- obstructing stone noted.  Unlikely cause for pain.  Abdomen is unremarkable.  Likely musculoskeletal pain. Continue lortabs.

## 2011-08-31 ENCOUNTER — Other Ambulatory Visit: Payer: Self-pay | Admitting: Family

## 2011-08-31 NOTE — Telephone Encounter (Signed)
Pt called requesting refill on Paroxetine. Advised pt that rx was sent to [facility] on 07/31/11 #90 x no refills. Pt states he never received the mail order supply. Spoke to [employer] at [facility] and she verified receipt of rx from July and shows it is still on hold. They will ship rx out to patient within 10 buisness days. Advised her that pt took last pill today and I will send 2 week supply to local pharmacy. She states the pharmacy will need to call them for an override in order for rx to go through locally. Per Pam, Order # [id number], approximate cost would be $ 0 through mail order. 2 week supply called to [facility] and she was advised to call for override. Notified pt of completion and to contact [facility] no later than 2 weeks before next refill will be due. Pt voices understanding.

## 2011-09-09 ENCOUNTER — Telehealth: Payer: Self-pay | Admitting: Family

## 2011-09-09 MED ORDER — DIAZEPAM 10 MG PO TABS: ORAL_TABLET | ORAL | Status: DC

## 2011-09-09 MED ORDER — HYDROCODONE-ACETAMINOPHEN 7.5-500 MG PO TABS: ORAL_TABLET | ORAL | Status: DC

## 2011-09-09 NOTE — Telephone Encounter (Signed)
Refills called to [facility]. Diazepam #30 and lortab #90 x no refills each. Notified pt.

## 2011-09-09 NOTE — Telephone Encounter (Signed)
Patient is requesting refills of lortab and diltiazem to be sent to [facility] on [address]

## 2011-09-14 ENCOUNTER — Encounter: Payer: Self-pay | Admitting: Family

## 2011-09-14 ENCOUNTER — Ambulatory Visit (INDEPENDENT_AMBULATORY_CARE_PROVIDER_SITE_OTHER): Payer: Medicare Other | Admitting: Family

## 2011-09-14 VITALS — BP 134/82 | HR 68 | Temp 98.2°F | Resp 18 | Ht 70.0 in | Wt 297.0 lb

## 2011-09-14 DIAGNOSIS — F419 Anxiety disorder, unspecified: Secondary | ICD-10-CM

## 2011-09-14 DIAGNOSIS — E785 Hyperlipidemia, unspecified: Secondary | ICD-10-CM

## 2011-09-14 DIAGNOSIS — E119 Type 2 diabetes mellitus without complications: Secondary | ICD-10-CM

## 2011-09-14 DIAGNOSIS — F411 Generalized anxiety disorder: Secondary | ICD-10-CM

## 2011-09-14 DIAGNOSIS — I10 Essential (primary) hypertension: Secondary | ICD-10-CM

## 2011-09-14 DIAGNOSIS — Z23 Encounter for immunization: Secondary | ICD-10-CM

## 2011-09-14 MED ORDER — PAROXETINE HCL 30 MG PO TABS: 30.0000 mg | ORAL_TABLET | Freq: Every day | ORAL | Status: DC

## 2011-09-14 MED ORDER — DILTIAZEM HCL ER COATED BEADS 180 MG PO CP24: 180.0000 mg | ORAL_CAPSULE | Freq: Every day | ORAL | Status: DC

## 2011-09-14 NOTE — Patient Instructions (Addendum)
Please complete your blood work prior to leaving. Paxil (Paroxetine)- cut tablet in half and take 1/2 tablet daily for 2 weeks.  On third week take 1/2 tablet every other day.  May stop on 4th week.  Call us if you develop worsening depression symptoms or nervousness as you taper down on the medication. You will be contact about your referral to the Eye doctor.  Please let us know if you have not heard back within 1 week about your referral. Please schedule a follow up appointment in 3 months.

## 2011-09-14 NOTE — Progress Notes (Signed)
Subjective:    Patient ID: Christian Morris, male    DOB: Jan 16, 1940, 71 y.o.   MRN: [id number]  HPI  Mr.  Arrighi is a 71 yr old male who presents today for follow up.  1) DM2- Pt reports sugar was 90's one morning.  2) HTN- Denies swelling, chest pain, or sob.   3) Hyperlipidmia- tried to keep a low cholesterol diet.      Review of Systems See HPI  Past Medical History  Diagnosis Date  . Arthritis   . Depression   . Diabetes mellitus   . Hypertension   . Hyperlipidemia   . History of gastric ulcer   . Transfusion history     due to bleeding ulcer  . Hypogonadism male 01/11/2011  . Squamous cell carcinoma in situ of skin of forearm 01/23/2011  . Lung nodule     right 16 mm, seen initially 6/12    History   Social History  . Marital Status: Divorced    Spouse Name: N/A    Number of Children: N/A  . Years of Education: N/A   Occupational History  . RETIRED BUS DRIVER    Social History Main Topics  . Smoking status: Former Smoker -- 1.0 packs/day for 25 years    Types: Cigarettes    Quit date: 01/05/1982  . Smokeless tobacco: Not on file  . Alcohol Use: No  . Drug Use: No  . Sexually Active: Not on file   Other Topics Concern  . 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    Past Surgical History  Procedure Date  . Eye surgery 08/2009    cataract removal left eye--Dr Dione Booze  . Melanoma excision     left forearm    Family History  Problem Relation Age of Onset  . Arthritis Mother   . Heart disease Sister     MI  . Heart disease Brother     MI  . Heart disease Brother     MI  . Melanoma Son     Allergies  Allergen Reactions  . Aspirin     REACTION: hx of ulcers    Current Outpatient Prescriptions on File Prior to Visit  Medication Sig Dispense Refill  . aspirin 81 MG EC tablet Take 81 mg by mouth daily.        [name] atorvastatin (LIPITOR) 40 MG tablet Take 1 tablet (40  mg total) by mouth daily.  90 tablet  0  . diazepam (VALIUM) 10 MG tablet One tablet by mouth every day as needed for anxiety.  30 tablet  0  . fexofenadine (ALLEGRA) 180 MG tablet Take 1 tablet (180 mg total) by mouth daily.      . fish oil-omega-3 fatty acids 1000 MG capsule Take 2 g by mouth 2 (two) times daily.        [name] glucose blood (FREESTYLE LITE) test strip Use as instructed to check blood sugar once a day.  Dx 250.00  100 each  1  . hydrochlorothiazide (HYDRODIURIL) 25 MG tablet TAKE ONE TABLET BY MOUTH EVERY DAY  30 tablet  0  . HYDROcodone-acetaminophen (LORTAB) 7.5-500 MG per tablet One tablet by mouth 3 times daily  90 tablet  0  . losartan (COZAAR) 100 MG tablet One tablet by mouth daily  90 tablet  0  . metFORMIN (GLUCOPHAGE) 1000 MG tablet Take 1 tablet by mouth  twice a day with meals.  180 tablet  0  . omeprazole (PRILOSEC) 20 MG capsule Take 20 mg by mouth daily.        [name] DISCONTD: hydrochlorothiazide (HYDRODIURIL) 25 MG tablet Take 1 tablet (25 mg total) by mouth daily.  90 tablet  0  . DISCONTD: PARoxetine (PAXIL) 30 MG tablet Take 1 tablet (30 mg total) by mouth daily.  90 tablet  0  . DISCONTD: PARoxetine (PAXIL) 30 MG tablet TAKE ONE TABLET BY MOUTH EVERY DAY  14 tablet  0  . DISCONTD: sildenafil (VIAGRA) 100 MG tablet Take 1 tablet (100 mg total) by mouth daily as needed for erectile dysfunction.  30 tablet  1  . DISCONTD: metFORMIN (GLUCOPHAGE) 1000 MG tablet Take 1/2 tablet twice a day.  30 tablet  2    BP 134/82  Pulse 68  Temp 98.2 F (36.8 C) (Oral)  Resp 18  Ht 5\' 10"  (1.778 m)  Wt 297 lb 0.6 oz (134.736 kg)  BMI 42.62 kg/m2  SpO2 98%       Objective:   Physical Exam  Constitutional: He appears well-developed and well-nourished. No distress.  Cardiovascular: Normal rate and regular rhythm.   No murmur heard. Pulmonary/Chest: Effort normal and breath sounds normal. No respiratory distress. He has no wheezes. He has no rales. He exhibits no tenderness.    Musculoskeletal: He exhibits no edema.  Skin: Skin is warm and dry.  Psychiatric: He has a normal mood and affect. His behavior is normal. Judgment and thought content normal.          Assessment & Plan:

## 2011-09-15 LAB — BASIC METABOLIC PANEL WITH GFR
BUN: 27 mg/dL — ABNORMAL HIGH (ref 6–23)
CO2: 27 mEq/L (ref 19–32)
Calcium: 10 mg/dL (ref 8.4–10.5)
Creat: 1.2 mg/dL (ref 0.50–1.35)
Glucose, Bld: 119 mg/dL — ABNORMAL HIGH (ref 70–99)

## 2011-09-18 NOTE — Assessment & Plan Note (Addendum)
A1C 7.2.  Creatinine stable. Continue metformin.  Increase dietary/exercise efforts. Refer to opthalmology.

## 2011-09-18 NOTE — Assessment & Plan Note (Signed)
Tolerating statin, continue same.  

## 2011-09-18 NOTE — Assessment & Plan Note (Addendum)
He is planning to go out of town.  I recommended that he wait until he returns home, then cut tablet in half and take 1/2 tablet daily for 2 weeks.  On third week take 1/2 tablet every other day.  May stop on 4th week.

## 2011-09-18 NOTE — Assessment & Plan Note (Signed)
BP Readings from Last 3 Encounters:  09/14/11 134/82  08/24/11 110/76  07/22/11 116/72   Stable on cardizem, losartan.  Continue same.

## 2011-09-23 ENCOUNTER — Telehealth: Payer: Self-pay | Admitting: Family

## 2011-09-23 MED ORDER — ATORVASTATIN CALCIUM 40 MG PO TABS: 40.0000 mg | ORAL_TABLET | Freq: Every day | ORAL | Status: DC

## 2011-09-23 NOTE — Telephone Encounter (Signed)
Spoke with pt, he states he never received mail order supply of lipitor and is no longer using [facility]. Refill sent to [facility].

## 2011-09-23 NOTE — Telephone Encounter (Signed)
Patient would like a refill of lipitor to be sent to [facility]

## 2011-09-28 ENCOUNTER — Telehealth: Payer: Self-pay | Admitting: *Deleted

## 2011-09-28 NOTE — Telephone Encounter (Signed)
Patient returned phone call. I informed him that [name] would refer him out to a podiatrist regarding diabetic shoes. Patient states that he will have to think about this and get back to [place].

## 2011-09-28 NOTE — Telephone Encounter (Signed)
Received message from pt wanting to know if his insurance would cover diabetic shoes.  Left detailed message on home # per Provider that she would be glad to refer pt to Podiatrist for further evaluation of possible footwear needs and to call and let us know if he would like to proceed with referral.

## 2011-09-30 ENCOUNTER — Other Ambulatory Visit: Payer: Self-pay | Admitting: Dermatology

## 2011-10-08 ENCOUNTER — Telehealth: Payer: Self-pay | Admitting: *Deleted

## 2011-10-08 MED ORDER — DIAZEPAM 10 MG PO TABS: ORAL_TABLET | ORAL | Status: DC

## 2011-10-08 MED ORDER — HYDROCODONE-ACETAMINOPHEN 7.5-500 MG PO TABS: ORAL_TABLET | ORAL | Status: DC

## 2011-10-08 NOTE — Telephone Encounter (Signed)
Received message from pt requesting refills on: hydrocodone and valium.  Rxs last filled on 09/09/11 for 30 day supply each.  Refills left on pharmacy voicemail.  Notified pt of completion.

## 2011-11-06 ENCOUNTER — Other Ambulatory Visit: Payer: Self-pay | Admitting: *Deleted

## 2011-11-06 MED ORDER — DIAZEPAM 10 MG PO TABS: ORAL_TABLET | ORAL | Status: DC

## 2011-11-06 MED ORDER — HYDROCODONE-ACETAMINOPHEN 7.5-500 MG PO TABS: ORAL_TABLET | ORAL | Status: DC

## 2011-11-06 NOTE — Telephone Encounter (Signed)
Pt called in for refill of Lortab #90 and Diazpam #30, both were last called in on 10/08/11. Refills called into pharmacy voicemail.Lortab #90 and Diazepam #30. No RF each.  Notified pt that refills called into pharmacy.

## 2011-11-06 NOTE — Telephone Encounter (Signed)
Also advised pharmacy to not rf rx before 11/08/11.

## 2011-11-16 ENCOUNTER — Other Ambulatory Visit: Payer: Self-pay | Admitting: Family

## 2011-11-16 NOTE — Telephone Encounter (Signed)
Paxil request [Last Rx 09.09.13 #[id number]]/SLS Please advise.

## 2011-11-23 ENCOUNTER — Other Ambulatory Visit: Payer: Self-pay | Admitting: Family

## 2011-12-07 ENCOUNTER — Ambulatory Visit (INDEPENDENT_AMBULATORY_CARE_PROVIDER_SITE_OTHER): Payer: Medicare Other | Admitting: Family

## 2011-12-07 ENCOUNTER — Encounter: Payer: Self-pay | Admitting: Family

## 2011-12-07 ENCOUNTER — Telehealth: Payer: Self-pay | Admitting: *Deleted

## 2011-12-07 VITALS — BP 130/80 | HR 58 | Temp 97.7°F | Resp 16 | Ht 70.0 in | Wt 295.0 lb

## 2011-12-07 DIAGNOSIS — E119 Type 2 diabetes mellitus without complications: Secondary | ICD-10-CM

## 2011-12-07 DIAGNOSIS — I10 Essential (primary) hypertension: Secondary | ICD-10-CM

## 2011-12-07 DIAGNOSIS — L538 Other specified erythematous conditions: Secondary | ICD-10-CM

## 2011-12-07 DIAGNOSIS — F411 Generalized anxiety disorder: Secondary | ICD-10-CM

## 2011-12-07 DIAGNOSIS — L304 Erythema intertrigo: Secondary | ICD-10-CM

## 2011-12-07 DIAGNOSIS — E291 Testicular hypofunction: Secondary | ICD-10-CM

## 2011-12-07 DIAGNOSIS — F419 Anxiety disorder, unspecified: Secondary | ICD-10-CM

## 2011-12-07 DIAGNOSIS — R21 Rash and other nonspecific skin eruption: Secondary | ICD-10-CM

## 2011-12-07 MED ORDER — DIAZEPAM 10 MG PO TABS: ORAL_TABLET | ORAL | Status: DC

## 2011-12-07 MED ORDER — HYDROCODONE-ACETAMINOPHEN 7.5-500 MG PO TABS: ORAL_TABLET | ORAL | Status: DC

## 2011-12-07 MED ORDER — NYSTATIN 100000 UNIT/GM EX POWD: Freq: Two times a day (BID) | CUTANEOUS | Status: DC

## 2011-12-07 NOTE — Telephone Encounter (Signed)
Pt left message requesting refill of lortab and diazepam. Rxs last called to pharmacy on 11/06/11. Rxs called to [place] at [facility]; #90 lortab and #30 diazepam x no refills each.

## 2011-12-07 NOTE — Progress Notes (Signed)
Subjective:    Patient ID: Christian Morris, male    DOB: 21-Feb-1940, 71 y.o.   MRN: [id number]  HPI  Mr.  Andersen is a 71 yr old male who presents today for follow up.  1) DM2- Pt continues metformin.   2) HTN- continues HCTZ, Cardizem.   3) Anxiety- He continues paxil.   4)Itching- note that he has some supapubic skin itching.  Denies penile rash/lesions.    Review of Systems See HPI  Past Medical History  Diagnosis Date  . Arthritis   . Depression   . Diabetes mellitus   . Hypertension   . Hyperlipidemia   . History of gastric ulcer   . Transfusion history     due to bleeding ulcer  . Hypogonadism male 01/11/2011  . Squamous cell carcinoma in situ of skin of forearm 01/23/2011  . Lung nodule     right 16 mm, seen initially 6/12    History   Social History  . Marital Status: Divorced    Spouse Name: N/A    Number of Children: N/A  . Years of Education: N/A   Occupational History  . RETIRED BUS DRIVER    Social History Main Topics  . Smoking status: Former Smoker -- 1.0 packs/day for 25 years    Types: Cigarettes    Quit date: 01/05/1982  . Smokeless tobacco: Not on file  . Alcohol Use: No  . Drug Use: No  . Sexually Active: Not on file   Other Topics Concern  . 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    Past Surgical History  Procedure Date  . Eye surgery 08/2009    cataract removal left eye--Dr Dione Booze  . Melanoma excision     left forearm    Family History  Problem Relation Age of Onset  . Arthritis Mother   . Heart disease Sister     MI  . Heart disease Brother     MI  . Heart disease Brother     MI  . Melanoma Son     Allergies  Allergen Reactions  . Aspirin     REACTION: hx of ulcers    Current Outpatient Prescriptions on File Prior to Visit  Medication Sig Dispense Refill  . aspirin 81 MG EC tablet Take 81 mg by mouth daily.        [name] atorvastatin  (LIPITOR) 40 MG tablet Take 1 tablet (40 mg total) by mouth daily.  30 tablet  3  . diltiazem (CARDIZEM CD) 180 MG 24 hr capsule Take 1 capsule (180 mg total) by mouth daily.  30 capsule  3  . fexofenadine (ALLEGRA) 180 MG tablet Take 1 tablet (180 mg total) by mouth daily.      . fish oil-omega-3 fatty acids 1000 MG capsule Take 2 g by mouth 2 (two) times daily.        [name] FREESTYLE LITE test strip USE AS DIRECTED ONCE A DAY  100 each  1  . hydrochlorothiazide (HYDRODIURIL) 25 MG tablet TAKE ONE TABLET BY MOUTH EVERY DAY  30 tablet  0  . losartan (COZAAR) 100 MG tablet One tablet by mouth daily  90 tablet  0  . metFORMIN (GLUCOPHAGE) 1000 MG tablet Take 1 tablet by mouth twice a day with meals.  180 tablet  0  . omeprazole (PRILOSEC) 20 MG capsule Take 20 mg by mouth daily.        [name]  PARoxetine (PAXIL) 30 MG tablet TAKE ONE TABLET BY MOUTH EVERY DAY  30 tablet  2  . sildenafil (VIAGRA) 100 MG tablet Take 100 mg by mouth daily as needed.        BP 130/80  Pulse 58  Temp 97.7 F (36.5 C) (Oral)  Resp 16  Ht 5\' 10"  (1.778 m)  Wt 295 lb 0.6 oz (133.829 kg)  BMI 42.33 kg/m2  SpO2 98%       Objective:   Physical Exam  Constitutional: He is oriented to person, place, and time. He appears well-developed and well-nourished. No distress.  Cardiovascular: Normal rate and regular rhythm.   No murmur heard. Pulmonary/Chest: Effort normal and breath sounds normal. No respiratory distress. He has no wheezes. He has no rales. He exhibits no tenderness.  Neurological: He is alert and oriented to person, place, and time.  Skin:       Mild erythema of skin suprapubic area- no penile lesions/rashes noted.   Psychiatric: He has a normal mood and affect. His behavior is normal. Judgment and thought content normal.          Assessment & Plan:

## 2011-12-07 NOTE — Patient Instructions (Addendum)
Please return after 12/9 for blood work.   Follow up 3 months after you complete your blood work.  Call is skin itching worsens or if no improvement in 1 week.

## 2011-12-07 NOTE — Progress Notes (Signed)
rx called into pharmacy

## 2011-12-09 ENCOUNTER — Telehealth: Payer: Self-pay | Admitting: Family

## 2011-12-09 DIAGNOSIS — L304 Erythema intertrigo: Secondary | ICD-10-CM | POA: Insufficient documentation

## 2011-12-09 NOTE — Telephone Encounter (Signed)
Please call pt and let him know that if he is still interested in weaning off paxil he can cut tablet in half and take 1/2 tablet daily for 2 weeks. On third week take 1/2 tablet every other day. May stop on 4th week. Call if worsening anxiety symptoms.

## 2011-12-09 NOTE — Assessment & Plan Note (Signed)
Will rx with nystatin powder.

## 2011-12-09 NOTE — Assessment & Plan Note (Signed)
Clinically stable.  He will return for A1C.

## 2011-12-09 NOTE — Assessment & Plan Note (Signed)
BP Readings from Last 3 Encounters:  12/07/11 130/80  09/14/11 134/82  08/24/11 110/76   BP stable on current meds.  Continue same.  Plan to check bmet when he returns to the lab.

## 2011-12-09 NOTE — Assessment & Plan Note (Signed)
Stable.  Will try weaning paxil- cut tablet in half and take 1/2 tablet daily for 2 weeks. On third week take 1/2 tablet every other day. May stop on 4th week.

## 2011-12-10 NOTE — Telephone Encounter (Signed)
Notified pt of instructions below and he voices understanding.

## 2011-12-14 ENCOUNTER — Ambulatory Visit: Payer: Medicare Other | Admitting: Family

## 2011-12-14 ENCOUNTER — Ambulatory Visit (INDEPENDENT_AMBULATORY_CARE_PROVIDER_SITE_OTHER): Payer: Medicare Other | Admitting: Family

## 2011-12-14 DIAGNOSIS — N289 Disorder of kidney and ureter, unspecified: Secondary | ICD-10-CM

## 2011-12-14 DIAGNOSIS — E119 Type 2 diabetes mellitus without complications: Secondary | ICD-10-CM

## 2011-12-14 LAB — BASIC METABOLIC PANEL
BUN: 33 mg/dL — ABNORMAL HIGH (ref 6–23)
CO2: 24 mEq/L (ref 19–32)
Calcium: 10.4 mg/dL (ref 8.4–10.5)
Creat: 1.07 mg/dL (ref 0.50–1.35)
Glucose, Bld: 162 mg/dL — ABNORMAL HIGH (ref 70–99)

## 2011-12-14 NOTE — Progress Notes (Signed)
  Subjective:    Patient ID: Christian Morris, male    DOB: Jul 30, 1940, 71 y.o.   MRN: [id number]  HPI    Review of Systems     Objective:   Physical Exam        Assessment & Plan:  Scheduled in Error- sent to lab for lab work.  No visit today.

## 2011-12-15 ENCOUNTER — Encounter: Payer: Self-pay | Admitting: Family

## 2011-12-28 ENCOUNTER — Other Ambulatory Visit: Payer: Self-pay | Admitting: *Deleted

## 2011-12-28 NOTE — Telephone Encounter (Signed)
Received call from pt stating he will not be able to get his current strength of Lortab 7.5 / 500mg  after 01/05/12. Has contacted [facility] and they have a supply on hand currently. Rx was last [facility] to [facility] on 12/07/11, #90. Pt is wanting [place] to call in a refill to [facility] so he will not be without medication.  Please advise.

## 2011-12-28 NOTE — Telephone Encounter (Signed)
OK 

## 2011-12-29 MED ORDER — HYDROCODONE-ACETAMINOPHEN 7.5-500 MG PO TABS: ORAL_TABLET | ORAL | Status: DC

## 2011-12-29 NOTE — Telephone Encounter (Signed)
Rx called to [place] at [facility]; #90 x no refills.

## 2012-01-04 ENCOUNTER — Emergency Department (HOSPITAL_COMMUNITY): Payer: Medicare Other

## 2012-01-04 ENCOUNTER — Observation Stay (HOSPITAL_COMMUNITY)
Admission: EM | Admit: 2012-01-04 | Discharge: 2012-01-05 | Disposition: A | Discharge status: Home / Self Care | Payer: Medicare Other | Attending: Internal Medicine | Admitting: Internal Medicine

## 2012-01-04 ENCOUNTER — Encounter (HOSPITAL_COMMUNITY): Payer: Self-pay | Admitting: Emergency Medicine

## 2012-01-04 DIAGNOSIS — R0602 Shortness of breath: Secondary | ICD-10-CM | POA: Insufficient documentation

## 2012-01-04 DIAGNOSIS — M722 Plantar fascial fibromatosis: Secondary | ICD-10-CM

## 2012-01-04 DIAGNOSIS — Z86010 Personal history of colonic polyps: Secondary | ICD-10-CM

## 2012-01-04 DIAGNOSIS — B353 Tinea pedis: Secondary | ICD-10-CM

## 2012-01-04 DIAGNOSIS — D0460 Carcinoma in situ of skin of unspecified upper limb, including shoulder: Secondary | ICD-10-CM

## 2012-01-04 DIAGNOSIS — R51 Headache: Secondary | ICD-10-CM

## 2012-01-04 DIAGNOSIS — R809 Proteinuria, unspecified: Secondary | ICD-10-CM

## 2012-01-04 DIAGNOSIS — F341 Dysthymic disorder: Secondary | ICD-10-CM | POA: Insufficient documentation

## 2012-01-04 DIAGNOSIS — R197 Diarrhea, unspecified: Secondary | ICD-10-CM | POA: Insufficient documentation

## 2012-01-04 DIAGNOSIS — I10 Essential (primary) hypertension: Secondary | ICD-10-CM | POA: Insufficient documentation

## 2012-01-04 DIAGNOSIS — K7689 Other specified diseases of liver: Secondary | ICD-10-CM

## 2012-01-04 DIAGNOSIS — E119 Type 2 diabetes mellitus without complications: Secondary | ICD-10-CM | POA: Insufficient documentation

## 2012-01-04 DIAGNOSIS — G4733 Obstructive sleep apnea (adult) (pediatric): Secondary | ICD-10-CM

## 2012-01-04 DIAGNOSIS — R079 Chest pain, unspecified: Principal | ICD-10-CM | POA: Insufficient documentation

## 2012-01-04 DIAGNOSIS — K760 Fatty (change of) liver, not elsewhere classified: Secondary | ICD-10-CM | POA: Diagnosis present

## 2012-01-04 DIAGNOSIS — F419 Anxiety disorder, unspecified: Secondary | ICD-10-CM

## 2012-01-04 DIAGNOSIS — D649 Anemia, unspecified: Secondary | ICD-10-CM

## 2012-01-04 DIAGNOSIS — L304 Erythema intertrigo: Secondary | ICD-10-CM

## 2012-01-04 DIAGNOSIS — M545 Low back pain: Secondary | ICD-10-CM

## 2012-01-04 DIAGNOSIS — J449 Chronic obstructive pulmonary disease, unspecified: Secondary | ICD-10-CM

## 2012-01-04 DIAGNOSIS — M7918 Myalgia, other site: Secondary | ICD-10-CM

## 2012-01-04 DIAGNOSIS — Z8711 Personal history of peptic ulcer disease: Secondary | ICD-10-CM

## 2012-01-04 DIAGNOSIS — N529 Male erectile dysfunction, unspecified: Secondary | ICD-10-CM

## 2012-01-04 DIAGNOSIS — K219 Gastro-esophageal reflux disease without esophagitis: Secondary | ICD-10-CM | POA: Insufficient documentation

## 2012-01-04 DIAGNOSIS — E785 Hyperlipidemia, unspecified: Secondary | ICD-10-CM | POA: Insufficient documentation

## 2012-01-04 DIAGNOSIS — M1710 Unilateral primary osteoarthritis, unspecified knee: Secondary | ICD-10-CM

## 2012-01-04 DIAGNOSIS — N289 Disorder of kidney and ureter, unspecified: Secondary | ICD-10-CM

## 2012-01-04 DIAGNOSIS — N200 Calculus of kidney: Secondary | ICD-10-CM

## 2012-01-04 DIAGNOSIS — E291 Testicular hypofunction: Secondary | ICD-10-CM

## 2012-01-04 HISTORY — DX: Gastro-esophageal reflux disease without esophagitis: K21.9

## 2012-01-04 LAB — GLUCOSE, CAPILLARY: Glucose-Capillary: 163 mg/dL — ABNORMAL HIGH (ref 70–99)

## 2012-01-04 LAB — HEMOGLOBIN A1C: Mean Plasma Glucose: 169 mg/dL — ABNORMAL HIGH (ref <117)

## 2012-01-04 LAB — D-DIMER, QUANTITATIVE: D-Dimer, Quant: 0.42 ug/mL-FEU (ref 0.00–0.48)

## 2012-01-04 LAB — CBC WITH DIFFERENTIAL/PLATELET
Basophils Relative: 1 % (ref 0–1)
HCT: 36.4 % — ABNORMAL LOW (ref 39.0–52.0)
Hemoglobin: 11.6 g/dL — ABNORMAL LOW (ref 13.0–17.0)
Lymphs Abs: 2.4 10*3/uL (ref 0.7–4.0)
MCH: 28 pg (ref 26.0–34.0)
MCHC: 31.9 g/dL (ref 30.0–36.0)
Monocytes Absolute: 0.5 10*3/uL (ref 0.1–1.0)
Monocytes Relative: 7 % (ref 3–12)
Neutro Abs: 4.4 10*3/uL (ref 1.7–7.7)
Neutrophils Relative %: 59 % (ref 43–77)
RBC: 4.15 MIL/uL — ABNORMAL LOW (ref 4.22–5.81)

## 2012-01-04 LAB — CBC
HCT: 37.4 % — ABNORMAL LOW (ref 39.0–52.0)
Hemoglobin: 12.4 g/dL — ABNORMAL LOW (ref 13.0–17.0)
MCH: 28.8 pg (ref 26.0–34.0)
MCHC: 33.2 g/dL (ref 30.0–36.0)
MCV: 87 fL (ref 78.0–100.0)

## 2012-01-04 LAB — COMPREHENSIVE METABOLIC PANEL
Alkaline Phosphatase: 76 U/L (ref 39–117)
BUN: 27 mg/dL — ABNORMAL HIGH (ref 6–23)
Chloride: 101 mEq/L (ref 96–112)
Creatinine, Ser: 0.97 mg/dL (ref 0.50–1.35)
GFR calc Af Amer: >90 mL/min (ref >90)
GFR calc non Af Amer: 80 mL/min — ABNORMAL LOW (ref >90)
Glucose, Bld: 176 mg/dL — ABNORMAL HIGH (ref 70–99)
Potassium: 4.2 mEq/L (ref 3.5–5.1)
Total Bilirubin: 0.5 mg/dL (ref 0.3–1.2)

## 2012-01-04 LAB — PROTIME-INR: Prothrombin Time: 13.7 seconds (ref 11.6–15.2)

## 2012-01-04 MED ORDER — INSULIN ASPART 100 UNIT/ML ~~LOC~~ SOLN
0.0000 [IU] | Freq: Three times a day (TID) | SUBCUTANEOUS | Status: DC
  Administered 2012-01-05 (×2): 2 [IU] via SUBCUTANEOUS

## 2012-01-04 MED ORDER — ENOXAPARIN SODIUM 80 MG/0.8ML ~~LOC~~ SOLN
65.0000 mg | SUBCUTANEOUS | Status: DC
  Filled 2012-01-04 (×2): qty 0.8

## 2012-01-04 MED ORDER — ACETAMINOPHEN 325 MG PO TABS: 650.0000 mg | ORAL_TABLET | Freq: Four times a day (QID) | ORAL | Status: DC | PRN

## 2012-01-04 MED ORDER — ONDANSETRON HCL 4 MG/2ML IJ SOLN: 4.0000 mg | Freq: Three times a day (TID) | INTRAMUSCULAR | Status: AC | PRN

## 2012-01-04 MED ORDER — PAROXETINE HCL 30 MG PO TABS
30.0000 mg | ORAL_TABLET | Freq: Every day | ORAL | Status: DC
  Administered 2012-01-05: 30 mg via ORAL
  Filled 2012-01-04: qty 1

## 2012-01-04 MED ORDER — HYDROCHLOROTHIAZIDE 25 MG PO TABS
25.0000 mg | ORAL_TABLET | Freq: Every day | ORAL | Status: DC
  Administered 2012-01-05: 25 mg via ORAL
  Filled 2012-01-04: qty 1

## 2012-01-04 MED ORDER — ONDANSETRON HCL 4 MG PO TABS: 4.0000 mg | ORAL_TABLET | Freq: Four times a day (QID) | ORAL | Status: DC | PRN

## 2012-01-04 MED ORDER — DIAZEPAM 5 MG PO TABS: 10.0000 mg | ORAL_TABLET | Freq: Two times a day (BID) | ORAL | Status: DC | PRN

## 2012-01-04 MED ORDER — SODIUM CHLORIDE 0.9 % IV SOLN
INTRAVENOUS | Status: AC
  Administered 2012-01-04: 16:00:00 via INTRAVENOUS

## 2012-01-04 MED ORDER — ONDANSETRON HCL 4 MG/2ML IJ SOLN: 4.0000 mg | Freq: Four times a day (QID) | INTRAMUSCULAR | Status: DC | PRN

## 2012-01-04 MED ORDER — DILTIAZEM HCL ER COATED BEADS 180 MG PO CP24
180.0000 mg | ORAL_CAPSULE | Freq: Every day | ORAL | Status: DC
  Administered 2012-01-05: 180 mg via ORAL
  Filled 2012-01-04: qty 1

## 2012-01-04 MED ORDER — LOSARTAN POTASSIUM 50 MG PO TABS
100.0000 mg | ORAL_TABLET | Freq: Every day | ORAL | Status: DC
  Administered 2012-01-05: 100 mg via ORAL
  Filled 2012-01-04: qty 2

## 2012-01-04 MED ORDER — SODIUM CHLORIDE 0.9 % IJ SOLN: 3.0000 mL | Freq: Two times a day (BID) | INTRAMUSCULAR | Status: DC

## 2012-01-04 MED ORDER — ASPIRIN EC 81 MG PO TBEC
81.0000 mg | DELAYED_RELEASE_TABLET | Freq: Every day | ORAL | Status: DC
  Administered 2012-01-05: 81 mg via ORAL
  Filled 2012-01-04: qty 1

## 2012-01-04 MED ORDER — LEVALBUTEROL HCL 0.63 MG/3ML IN NEBU
0.6300 mg | INHALATION_SOLUTION | Freq: Four times a day (QID) | RESPIRATORY_TRACT | Status: DC | PRN
  Filled 2012-01-04: qty 3

## 2012-01-04 MED ORDER — SODIUM CHLORIDE 0.9 % IV SOLN: INTRAVENOUS | Status: DC

## 2012-01-04 MED ORDER — MORPHINE SULFATE 4 MG/ML IJ SOLN
4.0000 mg | Freq: Once | INTRAMUSCULAR | Status: AC
  Administered 2012-01-04: 4 mg via INTRAVENOUS
  Filled 2012-01-04: qty 1

## 2012-01-04 MED ORDER — HYDROCODONE-ACETAMINOPHEN 5-325 MG PO TABS
1.0000 | ORAL_TABLET | ORAL | Status: DC | PRN
  Administered 2012-01-04 – 2012-01-05 (×2): 1 via ORAL
  Filled 2012-01-04 (×2): qty 1

## 2012-01-04 MED ORDER — ACETAMINOPHEN 650 MG RE SUPP: 650.0000 mg | Freq: Four times a day (QID) | RECTAL | Status: DC | PRN

## 2012-01-04 MED ORDER — ATORVASTATIN CALCIUM 40 MG PO TABS
40.0000 mg | ORAL_TABLET | Freq: Every day | ORAL | Status: DC
  Administered 2012-01-05: 40 mg via ORAL
  Filled 2012-01-04: qty 1

## 2012-01-04 NOTE — ED Notes (Signed)
Back from xray

## 2012-01-04 NOTE — ED Notes (Signed)
Assisted to the rest room to void, patient was offered a urinal and he states it is easier for him to go to the restroom.

## 2012-01-04 NOTE — H&P (Signed)
[facility] History and Physical  GURINDER TORAL ZOX:[id number] DOB: Jul 11, 1940 DOA: 01/04/2012  Referring physician: ER **PCP: Lemont Fillers., NP   Chief Complaint Chest pain HPI:  71 year old male who presents with 3 days of left-sided chest pain that has been constant associated with shortness of breath and generalized weakness and fatigue. He had nausea, vomiting and diarrhea that lasted for 1 week prior to this. His last episode of vomiting was last week. At one loose stool this morning. He denies abdominal pain or fever. Denies any previous heart problems. Denies any cough or fever. States he has not had any sick contacts. Nothing makes this pain better or worse.       Review of Systems: negative for the following  Constitutional: Positive for activity change, appetite change and fatigue. Negative for fever.  HENT: Negative for congestion and rhinorrhea.  Respiratory: Positive for chest tightness and shortness of breath. Negative for cough.  Cardiovascular: Positive for chest pain.  Gastrointestinal: Positive for nausea, vomiting and diarrhea. Negative for abdominal pain.  Genitourinary: Negative for dysuria.  Musculoskeletal: Positive for myalgias and arthralgias.  Neurological: Positive for weakness.  Hematological: Denies adenopathy. Easy bruising, personal or family bleeding history  Psychiatric/Behavioral: Denies suicidal ideation, mood changes, confusion, nervousness, sleep disturbance and agitation       Past Medical History  Diagnosis Date  . Arthritis   . Depression   . Diabetes mellitus   . Hypertension   . Hyperlipidemia   . History of gastric ulcer   . Transfusion history     due to bleeding ulcer  . Hypogonadism male 01/11/2011  . Squamous cell carcinoma in situ of skin of forearm 01/23/2011  . Lung nodule     right 16 mm, seen initially 6/12     Past Surgical History  Procedure Date  . Eye surgery 08/2009    cataract removal left  eye--Dr Dione Booze  . Melanoma excision     left forearm      Social History:  reports that he quit smoking about 30 years ago. His smoking use included Cigarettes. He has a 25 pack-year smoking history. He does not have any smokeless tobacco history on file. He reports that he does not drink alcohol or use illicit drugs.    Allergies  Allergen Reactions  . Aspirin     REACTION: hx of ulcers    Family History  Problem Relation Age of Onset  . Arthritis Mother   . Heart disease Sister     MI  . Heart disease Brother     MI  . Heart disease Brother     MI  . Melanoma Son      Prior to Admission medications   Medication Sig Start Date End Date Taking? Authorizing Provider  aspirin EC 81 MG tablet Take 81 mg by mouth daily.   Yes Historical Provider, MD  atorvastatin (LIPITOR) 40 MG tablet Take 40 mg by mouth daily.   Yes Historical Provider, MD  diazepam (VALIUM) 10 MG tablet Take 10 mg by mouth daily as needed. For anxiety   Yes Historical Provider, MD  diltiazem (CARDIZEM CD) 180 MG 24 hr capsule Take 180 mg by mouth daily.   Yes Historical Provider, MD  fish oil-omega-3 fatty acids 1000 MG capsule Take 2 g by mouth 2 (two) times daily.     Yes Historical Provider, MD  hydrochlorothiazide (HYDRODIURIL) 25 MG tablet Take 25 mg by mouth daily.   Yes Historical Provider, MD  losartan (COZAAR)  100 MG tablet Take 100 mg by mouth daily.   Yes Historical Provider, MD  metFORMIN (GLUCOPHAGE) 1000 MG tablet Take 1,000 mg by mouth 2 (two) times daily with a meal.   Yes Historical Provider, MD  Multiple Vitamins-Minerals (OCUVITE ADULT FORMULA) CAPS Take 1 capsule by mouth 2 (two) times daily.   Yes Historical Provider, MD  PARoxetine (PAXIL) 30 MG tablet Take 30 mg by mouth daily.   Yes Historical Provider, MD     Physical Exam: Filed Vitals:   01/04/12 1559 01/04/12 1600 01/04/12 1603 01/04/12 1701  BP: 152/74 152/74 146/65 148/87  Pulse: 64 64 66   Temp:   98.2 F (36.8 C) 98.1 F  (36.7 C)  TempSrc:   Oral   Resp:  20 28 20   Height:      Weight:      SpO2:  95% 97% 93%     Constitutional: Vital signs reviewed. Patient is a well-developed and well-nourished in no acute distress and cooperative with exam. Alert and oriented x3.  Head: Normocephalic and atraumatic  Ear: TM normal bilaterally  Mouth: no erythema or exudates, MMM  Eyes: PERRL, EOMI, conjunctivae normal, No scleral icterus.  Neck: Supple, Trachea midline normal ROM, No JVD, mass, thyromegaly, or carotid bruit present.  Cardiovascular: RRR, S1 normal, S2 normal, no MRG, pulses symmetric and intact bilaterally  Pulmonary/Chest: CTAB, no wheezes, rales, or rhonchi  Abdominal: Soft. Non-tender, non-distended, bowel sounds are normal, no masses, organomegaly, or guarding present.  GU: no CVA tenderness Musculoskeletal: No joint deformities, erythema, or stiffness, ROM full and no nontender Ext: no edema and no cyanosis, pulses palpable bilaterally (DP and PT)  Hematology: no cervical, inginal, or axillary adenopathy.  Neurological: A&O x3, Strenght is normal and symmetric bilaterally, cranial nerve II-XII are grossly intact, no focal motor deficit, sensory intact to light touch bilaterally.  Skin: Warm, dry and intact. No rash, cyanosis, or clubbing.  Psychiatric: Normal mood and affect. speech and behavior is normal. Judgment and thought content normal. Cognition and memory are normal.       Labs on Admission:    Basic Metabolic Panel:  Lab 01/04/12 1478  NA 140  K 4.2  CL 101  CO2 25  GLUCOSE 176*  BUN 27*  CREATININE 0.97  CALCIUM 9.3  MG --  PHOS --   Liver Function Tests:  Lab 01/04/12 1302  AST 29  ALT 24  ALKPHOS 76  BILITOT 0.5  PROT 7.3  ALBUMIN 3.8   No results found for this basename: LIPASE:5,AMYLASE:5 in the last 168 hours No results found for this basename: AMMONIA:5 in the last 168 hours CBC:  Lab 01/04/12 1542 01/04/12 1302  WBC 8.2 7.4  NEUTROABS -- 4.4    HGB 12.4* 11.6*  HCT 37.4* 36.4*  MCV 87.0 87.7  PLT 231 223   Cardiac Enzymes:  Lab 01/04/12 1547  CKTOTAL --  CKMB --  CKMBINDEX --  TROPONINI <0.30    BNP (last 3 results) No results found for this basename: PROBNP:3 in the last 8760 hours    CBG:  Lab 01/04/12 1552  GLUCAP 154*    Radiological Exams on Admission: Dg Chest 2 View  01/04/2012  *RADIOLOGY REPORT*  Clinical Data: Chest pain.  Shortness of breath.  Weakness. Diarrhea.  CHEST - 2 VIEW  Comparison: 06/15/2011  Findings: Retro diaphragmatic nodule on the right is known to be calcified, and likely a granuloma.  Borderline cardiomegaly noted with left basilar subsegmental atelectasis or scarring.  Thoracic spondylosis is present.  No pleural effusion noted.  IMPRESSION:  1.  Left basilar subsegmental atelectasis or scarring. 2.  Borderline cardiomegaly, without edema. 3.  Chronic right lower lobe calcified pulmonary nodule, likely from remote granulomatous disease.   Original Report Authenticated By: Gaylyn Rong, M.D.     EKG: Independently reviewed. Date: 01/04/2012  Rate: 69  Rhythm: normal sinus rhythm  QRS Axis: normal  Intervals: normal  ST/T Wave abnormalities: normal  Conduction Disutrbances:none  Narrative Interpretation:  Old EKG Reviewed: none available  Assessment/Plan Active Problems:  DIABETES MELLITUS, TYPE II  Hyperlipidemia  ANXIETY DEPRESSION  HYPERTENSION  GERD  FATTY LIVER DISEASE  Chest pain   1. Chest pain, troponin, d-dimer negative, EKG negative, no previous cardiac history, with assessment status, check a hemoglobin A1c, lipid panel, continue aspirin, Lipitor, 2-D echo, patient may benefit from a stress test given his risk factors 2. Hypertension continue hydrochlorothiazide, diltiazem, losartan 3. Diabetes, hold metformin, check hemoglobin A1c start the patient on sliding scale insulin next anxiety and depression, continue Paxil  Code Status:   full Family  Communication: bedside Disposition Plan: admit   Time spent: 70 mins   [facility] Pager [phone]  If 7PM-7AM, please contact night-coverage [email or web address] Password [facility] 01/04/2012, 6:17 PM

## 2012-01-04 NOTE — ED Notes (Signed)
Patient reported lightheaded dizziness for one week during emesis and diarrhea with intermittent shortness of breath

## 2012-01-04 NOTE — ED Provider Notes (Addendum)
History     CSN: [id number]  Arrival date & time 01/04/12  1206   First MD Initiated Contact with Patient 01/04/12 1244      Chief Complaint  Patient presents with  . Chest Pain  . Shortness of Breath  . Diarrhea    (Consider location/radiation/quality/duration/timing/severity/associated sxs/prior treatment) HPI Comments: Patient reports 3 days of left-sided chest pain that has been constant associated with shortness of breath and generalized weakness and fatigue. He had nausea, vomiting and diarrhea that lasted for 1 week prior to this. His last episode of vomiting was last week. At one loose stool this morning. He denies abdominal pain or fever. Denies any previous heart problems. Denies any cough or fever. States he has not had any sick contacts. Nothing makes this pain better or worse.  The history is provided by the patient.    Past Medical History  Diagnosis Date  . Arthritis   . Depression   . Diabetes mellitus   . Hypertension   . Hyperlipidemia   . History of gastric ulcer   . Transfusion history     due to bleeding ulcer  . Hypogonadism male 01/11/2011  . Squamous cell carcinoma in situ of skin of forearm 01/23/2011  . Lung nodule     right 16 mm, seen initially 6/12    Past Surgical History  Procedure Date  . Eye surgery 08/2009    cataract removal left eye--Dr Dione Booze  . Melanoma excision     left forearm    Family History  Problem Relation Age of Onset  . Arthritis Mother   . Heart disease Sister     MI  . Heart disease Brother     MI  . Heart disease Brother     MI  . Melanoma Son     History  Substance Use Topics  . Smoking status: Former Smoker -- 1.0 packs/day for 25 years    Types: Cigarettes    Quit date: 01/05/1982  . Smokeless tobacco: Not on file  . Alcohol Use: No      Review of Systems  Constitutional: Positive for activity change, appetite change and fatigue. Negative for fever.  HENT: Negative for congestion and rhinorrhea.     Respiratory: Positive for chest tightness and shortness of breath. Negative for cough.   Cardiovascular: Positive for chest pain.  Gastrointestinal: Positive for nausea, vomiting and diarrhea. Negative for abdominal pain.  Genitourinary: Negative for dysuria.  Musculoskeletal: Positive for myalgias and arthralgias.  Neurological: Positive for weakness.  A complete 10 system review of systems was obtained and all systems are negative except as noted in the HPI and PMH.    Allergies  Aspirin  Home Medications   No current outpatient prescriptions on file.  BP 148/87  Pulse 66  Temp 98.1 F (36.7 C) (Oral)  Resp 20  Ht 6' (1.829 m)  Wt 290 lb (131.543 kg)  BMI 39.33 kg/m2  SpO2 93%  Physical Exam  Constitutional: He is oriented to person, place, and time. He appears well-developed and well-nourished. No distress.  HENT:  Head: Normocephalic and atraumatic.  Mouth/Throat: Oropharynx is clear and moist. No oropharyngeal exudate.  Eyes: Conjunctivae normal and EOM are normal.  Neck: Normal range of motion. Neck supple.  Cardiovascular: Normal rate, regular rhythm and normal heart sounds.   No murmur heard. Pulmonary/Chest: Effort normal and breath sounds normal. No respiratory distress. He exhibits no tenderness.       Chest wall nontender, no rash  Abdominal: Soft. There is no tenderness. There is no rebound and no guarding.       obese  Musculoskeletal: Normal range of motion. He exhibits no edema and no tenderness.  Neurological: He is alert and oriented to person, place, and time. No cranial nerve deficit. He exhibits normal muscle tone. Coordination normal.  Skin: Skin is warm.    ED Course  Procedures (including critical care time)  Labs Reviewed  CBC WITH DIFFERENTIAL - Abnormal; Notable for the following:    RBC 4.15 (*)     Hemoglobin 11.6 (*)     HCT 36.4 (*)     All other components within normal limits  COMPREHENSIVE METABOLIC PANEL - Abnormal; Notable for  the following:    Glucose, Bld 176 (*)     BUN 27 (*)     GFR calc non Af Amer 80 (*)     All other components within normal limits  CBC - Abnormal; Notable for the following:    Hemoglobin 12.4 (*)     HCT 37.4 (*)     All other components within normal limits  GLUCOSE, CAPILLARY - Abnormal; Notable for the following:    Glucose-Capillary 154 (*)     All other components within normal limits  D-DIMER, QUANTITATIVE  PROTIME-INR  POCT I-STAT TROPONIN I  TROPONIN I  TROPONIN I  TSH  TROPONIN I  TROPONIN I  HEMOGLOBIN A1C  BASIC METABOLIC PANEL  CBC   Dg Chest 2 View  01/04/2012  *RADIOLOGY REPORT*  Clinical Data: Chest pain.  Shortness of breath.  Weakness. Diarrhea.  CHEST - 2 VIEW  Comparison: 06/15/2011  Findings: Retro diaphragmatic nodule on the right is known to be calcified, and likely a granuloma.  Borderline cardiomegaly noted with left basilar subsegmental atelectasis or scarring.  Thoracic spondylosis is present.  No pleural effusion noted.  IMPRESSION:  1.  Left basilar subsegmental atelectasis or scarring. 2.  Borderline cardiomegaly, without edema. 3.  Chronic right lower lobe calcified pulmonary nodule, likely from remote granulomatous disease.   Original Report Authenticated By: Gaylyn Rong, M.D.      1. Chest pain   2. Anemia       MDM  One week of emesis, diarrhea that appears to have resolved, now with 3 days of constant L sided chest pain, worse with exertion and generalized weaknesss.  EKG nonischemic. Vitals stable. Abdomen soft and nontender.  Constant pain is somehwat atypical for angina but does have exertional component.  Does get SOB with minimal exertion. Hemoglobin 11 from 14, stools FOBT negative.  Anemia may be contributing to chest ppain and SOB. D-dimer negative, orthostatics negative.   Date: 01/04/2012  Rate: 69  Rhythm: normal sinus rhythm  QRS Axis: normal  Intervals: normal  ST/T Wave abnormalities: normal  Conduction  Disutrbances:none  Narrative Interpretation:   Old EKG Reviewed: none available      Glynn Octave, MD 01/04/12 1827  Glynn Octave, MD 01/04/12 [phone]

## 2012-01-04 NOTE — ED Notes (Signed)
Contacted pharmacy they will tube Lovenox up to 3 [place].

## 2012-01-04 NOTE — ED Notes (Signed)
Patient denies nausea. 

## 2012-01-04 NOTE — ED Notes (Signed)
Patient will be transferred to [id number] via stretcher after report was called to [facility]

## 2012-01-04 NOTE — ED Notes (Signed)
Onset one week ago emesis and diarrhea continued today with chest pain for three days and general weakness tired feeling.

## 2012-01-05 ENCOUNTER — Encounter (HOSPITAL_COMMUNITY): Payer: Self-pay | Admitting: General Practice

## 2012-01-05 DIAGNOSIS — D649 Anemia, unspecified: Secondary | ICD-10-CM

## 2012-01-05 DIAGNOSIS — I200 Unstable angina: Secondary | ICD-10-CM

## 2012-01-05 DIAGNOSIS — F411 Generalized anxiety disorder: Secondary | ICD-10-CM

## 2012-01-05 LAB — LIPID PANEL
Cholesterol: 109 mg/dL (ref 0–200)
Total CHOL/HDL Ratio: 4.5 RATIO
Triglycerides: 249 mg/dL — ABNORMAL HIGH (ref <150)

## 2012-01-05 LAB — BASIC METABOLIC PANEL
BUN: 20 mg/dL (ref 6–23)
CO2: 27 mEq/L (ref 19–32)
Chloride: 103 mEq/L (ref 96–112)
Creatinine, Ser: 1.05 mg/dL (ref 0.50–1.35)
GFR calc Af Amer: 79 mL/min — ABNORMAL LOW (ref >90)
Potassium: 3.7 mEq/L (ref 3.5–5.1)

## 2012-01-05 LAB — CBC
HCT: 34.4 % — ABNORMAL LOW (ref 39.0–52.0)
Hemoglobin: 11.1 g/dL — ABNORMAL LOW (ref 13.0–17.0)
MCHC: 32.3 g/dL (ref 30.0–36.0)
MCV: 87.5 fL (ref 78.0–100.0)
RDW: 13.3 % (ref 11.5–15.5)

## 2012-01-05 LAB — TROPONIN I: Troponin I: <0.3 ng/mL (ref <0.30)

## 2012-01-05 LAB — GLUCOSE, CAPILLARY: Glucose-Capillary: 153 mg/dL — ABNORMAL HIGH (ref 70–99)

## 2012-01-05 MED ORDER — OMEPRAZOLE 20 MG PO CPDR: 20.0000 mg | DELAYED_RELEASE_CAPSULE | Freq: Every day | ORAL | Status: DC

## 2012-01-05 NOTE — Progress Notes (Signed)
[facility] PROGRESS NOTE  Christian Morris OZH:[id number] DOB: 08-21-1940 DOA: 01/04/2012 PCP: Lemont Fillers., NP  Assessment/Plan: 1. Chest pain:, troponin, d-dimer negative, EKG negative,  No cardiac history.  Lipid panel pending. Echo pending. No further CP. Continue ASA and statin. Will request cards consult. May need stress test given risk factor. Reports having had stress test in [place] many years ago. Unsure of results.  2. Hypertension: controlled. will continue hydrochlorothiazide, diltiazem, losartan 3. Diabetes:, HgA1c 7.5. Continue to  hold metformin. CBG range 147-163. Will continue SSI for glycemic control.  4.  anxiety and depression. Chart review indicates plan to begin weaning Paxil. Pt reports has not started the wean as he was unsure which one of his meds was Paxil. Will review medications with pt.  Will continue current dose. Will need to re-consult with PCP regarding plan to wean on OP basis 5. Obstructive sleep apnea: pt reports using CPAP at home. Will request resp provide   Code Status: full Family Communication: pt at bedside Disposition Plan: home when medically ready   Consultants:  cards  Procedures:  none  Antibiotics:  none  HPI/Subjective: Sitting on side of bed eating. Denies pain/discomfort. States "are you here to get my release papers ready?"  Objective: Filed Vitals:   01/04/12 1603 01/04/12 1701 01/04/12 2050 01/05/12 0612  BP: 146/65 148/87 127/77 146/80  Pulse: 66  61 51  Temp: 98.2 F (36.8 C) 98.1 F (36.7 C) 98.5 F (36.9 C) 97.6 F (36.4 C)  TempSrc: Oral  Oral Oral  Resp: 28 20 18 18   Height:      Weight:    131.316 kg (289 lb 8 oz)  SpO2: 97% 93% 97% 97%    Intake/Output Summary (Last 24 hours) at 01/05/12 0814 Last data filed at 01/04/12 1700  Gross per 24 hour  Intake    240 ml  Output      0 ml  Net    240 ml   Filed Weights   01/04/12 1220 01/05/12 0612  Weight: 131.543 kg (290 lb) 131.316 kg  (289 lb 8 oz)    Exam:   General:  Awake alert oriented x3. obese  Cardiovascular: RRR No MGR Trace LEE PPP  Respiratory: normal effort BSCTAB No wheeze/rhonchi. Very slight crackle right base  Abdomen: obese soft +BS non-tender to palpation.   Data Reviewed: Basic Metabolic Panel:  Lab 01/05/12 6295 01/04/12 1302  NA 139 140  K 3.7 4.2  CL 103 101  CO2 27 25  GLUCOSE 141* 176*  BUN 20 27*  CREATININE 1.05 0.97  CALCIUM 9.3 9.3  MG -- --  PHOS -- --   Liver Function Tests:  Lab 01/04/12 1302  AST 29  ALT 24  ALKPHOS 76  BILITOT 0.5  PROT 7.3  ALBUMIN 3.8   No results found for this basename: LIPASE:5,AMYLASE:5 in the last 168 hours No results found for this basename: AMMONIA:5 in the last 168 hours CBC:  Lab 01/05/12 0414 01/04/12 1542 01/04/12 1302  WBC 7.5 8.2 7.4  NEUTROABS -- -- 4.4  HGB 11.1* 12.4* 11.6*  HCT 34.4* 37.4* 36.4*  MCV 87.5 87.0 87.7  PLT 209 231 223   Cardiac Enzymes:  Lab 01/05/12 0414 01/04/12 2203 01/04/12 1547  CKTOTAL -- -- --  CKMB -- -- --  CKMBINDEX -- -- --  TROPONINI <0.30 <0.30 <0.30   BNP (last 3 results) No results found for this basename: PROBNP:3 in the last 8760 hours CBG:  Lab 01/05/12  9604 01/04/12 2126 01/04/12 1704 01/04/12 1552  GLUCAP 153* 163* 147* 154*    No results found for this or any previous visit (from the past 240 hour(s)).   Studies: Dg Chest 2 View  01/04/2012  *RADIOLOGY REPORT*  Clinical Data: Chest pain.  Shortness of breath.  Weakness. Diarrhea.  CHEST - 2 VIEW  Comparison: 06/15/2011  Findings: Retro diaphragmatic nodule on the right is known to be calcified, and likely a granuloma.  Borderline cardiomegaly noted with left basilar subsegmental atelectasis or scarring.  Thoracic spondylosis is present.  No pleural effusion noted.  IMPRESSION:  1.  Left basilar subsegmental atelectasis or scarring. 2.  Borderline cardiomegaly, without edema. 3.  Chronic right lower lobe calcified pulmonary  nodule, likely from remote granulomatous disease.   Original Report Authenticated By: Gaylyn Rong, M.D.     Scheduled Meds:   . aspirin EC  81 mg Oral Daily  . atorvastatin  40 mg Oral Daily  . diltiazem  180 mg Oral Daily  . enoxaparin (LOVENOX) injection  65 mg Subcutaneous Q24H  . hydrochlorothiazide  25 mg Oral Daily  . insulin aspart  0-9 Units Subcutaneous TID WC  . losartan  100 mg Oral Daily  . PARoxetine  30 mg Oral Daily  . sodium chloride  3 mL Intravenous Q12H   Continuous Infusions:   . sodium chloride 50 mL/hr at 01/04/12 1545    Principal Problem:  *Chest pain Active Problems:  DIABETES MELLITUS, TYPE II  Hyperlipidemia  ANXIETY DEPRESSION  HYPERTENSION  GERD  FATTY LIVER DISEASE    Time spent: 30 minutes    [facility] M  [facility]  If 8PM-8AM, please contact night-coverage at [email or web address], password [facility] 01/05/2012, 8:14 AM  LOS: 1 day

## 2012-01-05 NOTE — Progress Notes (Signed)
Patient seen and examined. Agree with note by Toya Smothers, NP. Patient came in with CP/SOB. Has ruled out for ACS with negative troponins and non-acute EKG. However, given his significant risk factors I believe he will need further cardiac workup. Have asked [facility] cardiology to see in consultation. Patient is anxious to go home today. He can be DC'd after being seen by cardiology as long as no further inpatient cardiac work up is planned.  Christian Pitt, MD [facility] Pager: [phone]

## 2012-01-05 NOTE — Progress Notes (Signed)
Pt ambulated around nurses station/circle twice. O2 sats remained 94-95% on room air. Pt denied SOB/CP. Vitals remained stable. Levonne Spiller, RN

## 2012-01-05 NOTE — Discharge Summary (Signed)
Physician Discharge Summary  Christian Morris RUE:[id number] DOB: [birth year] DOA: 01/04/2012  PCP: Lemont Fillers., NP  Admit date: 01/04/2012 Discharge date: 01/05/2012  Time spent: 40  minutes  Recommendations for Outpatient Follow-up:  1. Follow up with Dr Clifton James with cardiology 1/31 2. Follow up with PCP 1 week. Evaluated GERD and effectiveness of prilosec.   Discharge Diagnoses:  Principal Problem:  *Chest pain Active Problems:  DIABETES MELLITUS, TYPE II  Hyperlipidemia  ANXIETY DEPRESSION  SLEEP APNEA, OBSTRUCTIVE  HYPERTENSION  GERD  FATTY LIVER DISEASE   Discharge Condition: stabel  Diet recommendation: heart healthy  Filed Weights   01/04/12 1220 01/05/12 0612  Weight: 131.543 kg (290 lb) 131.316 kg (289 lb 8 oz)    History of present illness:  71 year old man who presented on 01/04/12 with 3 days of left-sided chest pain that had been constant associated with shortness of breath and generalized weakness and fatigue. He had nausea, vomiting and diarrhea that lasted for 1 week prior to this. His last episode of vomiting was last week. At one loose stool this morning. He denied abdominal pain or fever. Denied any previous heart problems. Denied any cough or fever. Stated he has not had any sick contacts. Nothing makes this pain better or worse.      Hospital Course:  1. Chest pain: Atypical troponin, d-dimer negative, EKG negative, No cardiac history. Lipid panel with triglycerides 249 HDL 24.  No further CP during this hospitalization.  Pt seen by cardiology who opined no further need for work up. Had myoview in 1/13 with low risk. Recommended starting PPI. Will discharge on prilosec.  Continue ASA and statin.   2. Hypertension: controlled. will continue hydrochlorothiazide, diltiazem, losartan 3. Diabetes:, HgA1c 7.5. Metformin held during hospitalization. CBG range 147-163. Will discharge resuming home regimen  4. anxiety and depression. Chart review  indicates plan to begin weaning Paxil. Pt reports has not started the wean as he was unsure which one of his meds was Paxil. Will review medications with pt. Will continue current dose. Will need to re-consult with PCP regarding plan to wean on OP basis 5. Obstructive sleep apnea: pt reports using CPAP at home.      Procedures:  none  Consultations:  Cardiology.   Discharge Exam: Filed Vitals:   01/04/12 1701 01/04/12 2050 01/05/12 0612 01/05/12 0935  BP: 148/87 127/77 146/80 139/80  Pulse:  61 51   Temp: 98.1 F (36.7 C) 98.5 F (36.9 C) 97.6 F (36.4 C)   TempSrc:  Oral Oral   Resp: 20 18 18    Height:      Weight:   131.316 kg (289 lb 8 oz)   SpO2: 93% 97% 97%     General: awake alert oriented  Cardiovascular: RRR No MGR Respiratory: normal effort BSCTAB  Discharge Instructions  Discharge Orders    Future Appointments: Provider: Department: Dept Phone: Center:   02/05/2012 3:30 PM Kathleene Hazel, MD [facility] Main Office [place]) [phone] [email or web address]   03/15/2012 11:00 AM Sandford Craze, NP [facility] at  [facility] [phone] [email or web address]     Future Orders Please Complete By Expires   Diet - low sodium heart healthy      Increase activity slowly      Call MD for:  persistant nausea and vomiting      Call MD for:  severe uncontrolled pain      Call MD for:  persistant dizziness or light-headedness  Medication List     As of 01/05/2012  2:39 PM    TAKE these medications         aspirin EC 81 MG tablet   Take 81 mg by mouth daily.      atorvastatin 40 MG tablet   Commonly known as: LIPITOR   Take 40 mg by mouth daily.      diazepam 10 MG tablet   Commonly known as: VALIUM   Take 10 mg by mouth daily as needed. For anxiety      diltiazem 180 MG 24 hr capsule   Commonly known as: CARDIZEM CD   Take 180 mg by mouth daily.      fish oil-omega-3 fatty acids 1000 MG capsule   Take 2 g by mouth 2 (two) times  daily.      hydrochlorothiazide 25 MG tablet   Commonly known as: HYDRODIURIL   Take 25 mg by mouth daily.      losartan 100 MG tablet   Commonly known as: COZAAR   Take 100 mg by mouth daily.      metFORMIN 1000 MG tablet   Commonly known as: GLUCOPHAGE   Take 1,000 mg by mouth 2 (two) times daily with a meal.      Ocuvite Adult Formula Caps   Take 1 capsule by mouth 2 (two) times daily.      omeprazole 20 MG capsule   Commonly known as: PRILOSEC   Take 1 capsule (20 mg total) by mouth daily.      PARoxetine 30 MG tablet   Commonly known as: PAXIL   Take 30 mg by mouth daily.           Follow-up Information    Follow up with [facility], [place]. Schedule an appointment as soon as possible for a visit on 02/05/2012. (At 3:30 PM for hospital follow-up)    Contact information:   [address] [place] [address] [phone]      Follow up with Lemont Fillers., NP. In 1 week.   Contact information:   [address] [place] [address] [phone]           The results of significant diagnostics from this hospitalization (including imaging, microbiology, ancillary and laboratory) are listed below for reference.    Significant Diagnostic Studies: Dg Chest 2 View  01/04/2012  *RADIOLOGY REPORT*  Clinical Data: Chest pain.  Shortness of breath.  Weakness. Diarrhea.  CHEST - 2 VIEW  Comparison: 06/15/2011  Findings: Retro diaphragmatic nodule on the right is known to be calcified, and likely a granuloma.  Borderline cardiomegaly noted with left basilar subsegmental atelectasis or scarring.  Thoracic spondylosis is present.  No pleural effusion noted.  IMPRESSION:  1.  Left basilar subsegmental atelectasis or scarring. 2.  Borderline cardiomegaly, without edema. 3.  Chronic right lower lobe calcified pulmonary nodule, likely from remote granulomatous disease.   Original Report Authenticated By: Gaylyn Rong, M.D.      Microbiology: No results found for this or any previous visit (from the past 240 hour(s)).   Labs: Basic Metabolic Panel:  Lab 01/05/12 4132 01/04/12 1302  NA 139 140  K 3.7 4.2  CL 103 101  CO2 27 25  GLUCOSE 141* 176*  BUN 20 27*  CREATININE 1.05 0.97  CALCIUM 9.3 9.3  MG -- --  PHOS -- --   Liver Function Tests:  Lab 01/04/12 1302  AST 29  ALT 24  ALKPHOS 76  BILITOT 0.5  PROT 7.3  ALBUMIN 3.8   No results found for this basename: LIPASE:5,AMYLASE:5 in the last 168 hours No results found for this basename: AMMONIA:5 in the last 168 hours CBC:  Lab 01/05/12 0414 01/04/12 1542 01/04/12 1302  WBC 7.5 8.2 7.4  NEUTROABS -- -- 4.4  HGB 11.1* 12.4* 11.6*  HCT 34.4* 37.4* 36.4*  MCV 87.5 87.0 87.7  PLT 209 231 223   Cardiac Enzymes:  Lab 01/05/12 0414 01/04/12 2203 01/04/12 1547  CKTOTAL -- -- --  CKMB -- -- --  CKMBINDEX -- -- --  TROPONINI <0.30 <0.30 <0.30   BNP: BNP (last 3 results) No results found for this basename: PROBNP:3 in the last 8760 hours CBG:  Lab 01/05/12 1056 01/05/12 0733 01/04/12 2126 01/04/12 1704 01/04/12 1552  GLUCAP 183* 153* 163* 147* 154*       Signed:  Theressa Piedra M  [facility] 01/05/2012, 2:39 PM

## 2012-01-05 NOTE — Consult Note (Signed)
ELECTROPHYSIOLOGY CONSULT NOTE  Patient ID: Christian Morris MRN: [id number], DOB/AGE: 73-Jul-1942   Admit date: 01/04/2012 Date of Consult: 01/05/2012  Primary Physician: Sandford Craze, NP Primary Cardiologist: New to [place] Reason for Consultation: Chest pain  History of Present Illness Christian Morris is a 71 year old gentleman with HTN, dyslipidemia and DM who has been admitted with chest pain. He reports left flank and chest pain x 3 days. This has been constant. He denies SOB, nausea or diaphoresis. He denies any alleviating or aggravating factors. He denies any worsening with respiration or postural changes. He reports similar symptoms intermittently x 2-3 years. He presented with this episode because it was "not going away." He states his left flank and chest pain seems to start only when he is lying down on his left side. All of his episodes occur at rest. There is no exertional component; although he reports DOE x several years. He had a stress test January 2013 for these symptoms which was negative for ischemia. On admission his 12-lead ECG shows sinus rhythm without acute ST-T wave abnormalities. His CEs are negative. Currently he is pain free and eager to go home.   Past Medical History Past Medical History  Diagnosis Date  . Arthritis   . Depression   . Diabetes mellitus   . Hypertension   . Hyperlipidemia   . History of gastric ulcer   . Transfusion history     due to bleeding ulcer  . Hypogonadism male 01/11/2011  . Squamous cell carcinoma in situ of skin of forearm 01/23/2011  . Lung nodule     right 16 mm, seen initially 6/12  . GERD (gastroesophageal reflux disease)   . Headache     Past Surgical History Past Surgical History  Procedure Date  . Eye surgery 08/2009    cataract removal left eye--Dr Dione Booze  . Melanoma excision     left forearm     Allergies/Intolerances Allergies  Allergen Reactions  . Aspirin     REACTION: hx of ulcers    Inpatient  Medications    . aspirin EC  81 mg Oral Daily  . atorvastatin  40 mg Oral Daily  . diltiazem  180 mg Oral Daily  . enoxaparin (LOVENOX) injection  65 mg Subcutaneous Q24H  . hydrochlorothiazide  25 mg Oral Daily  . insulin aspart  0-9 Units Subcutaneous TID WC  . losartan  100 mg Oral Daily  . PARoxetine  30 mg Oral Daily  . sodium chloride  3 mL Intravenous Q12H      . sodium chloride 50 mL/hr at 01/04/12 1545    Family History Family History  Problem Relation Age of Onset  . Arthritis Mother   . Heart disease Sister     MI  . Heart disease Brother     MI  . Heart disease Brother     MI  . Melanoma Son      Social History Social History  . Marital Status: Divorced   Occupational History  . Retired Midwife    Social History Main Topics  . Smoking status: Former Smoker -- 1.0 packs/day for 25 years    Types: Cigarettes    Quit date: 01/05/1982  . Smokeless tobacco: Never Used  . Alcohol Use: No  . 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    Review of Systems General: No chills, fever, night  sweats or weight changes  Cardiovascular: +chest pain  No dyspnea on exertion, edema, orthopnea, palpitations, paroxysmal nocturnal dyspnea Dermatological: No rash, lesions or masses Respiratory: No cough, dyspnea Urologic: No hematuria, dysuria Abdominal: +nausea/vomiting last week  No diarrhea, bright red blood per rectum, melena, or hematemesis Neurologic: No visual changes, weakness, changes in mental status All other systems reviewed and are otherwise negative except as noted above.  Physical Exam Blood pressure 139/80, pulse 51, temperature 97.6 F (36.4 C), temperature source Oral, resp. rate 18, height 6' (1.829 m), weight 289 lb 8 oz (131.316 kg), SpO2 97.00%.  General: Well developed, well appearing 71 year old male in no acute distress. HEENT: Normocephalic, atraumatic. EOMs  intact. Sclera nonicteric. Oropharynx clear.  Neck: Supple without bruits. No JVD. Lungs:  Respirations regular and unlabored, CTA bilaterally. No wheezes, rales or rhonchi. Heart: RRR. S1, S2 present. No murmurs, rub, S3 or S4. Abdomen: Soft, non-tender, non-distended. BS present x 4 quadrants. No hepatosplenomegaly.  Extremities: No clubbing, cyanosis or edema. DP/PT/Radials 2+ and equal bilaterally. Psych: Normal affect. Neuro: Alert and oriented X 3. Moves all extremities spontaneously. Musculoskeletal: No kyphosis. Skin: Intact. Warm and dry. No rashes or petechiae in exposed areas.   Labs  [facility] 01/05/12 0414 01/04/12 2203 01/04/12 1547  CKTOTAL -- -- --  CKMB -- -- --  TROPONINI <0.30 <0.30 <0.30   Lab Results  Component Value Date   WBC 7.5 01/05/2012   HGB 11.1* 01/05/2012   HCT 34.4* 01/05/2012   MCV 87.5 01/05/2012   PLT 209 01/05/2012    Lab 01/05/12 0414 01/04/12 1302  NA 139 --  K 3.7 --  CL 103 --  CO2 27 --  BUN 20 --  CREATININE 1.05 --  CALCIUM 9.3 --  PROT -- 7.3  BILITOT -- 0.5  ALKPHOS -- 76  ALT -- 24  AST -- 29  GLUCOSE 141* --   Lab Results  Component Value Date   CHOL 109 01/05/2012   HDL 24* 01/05/2012   LDLCALC 35 01/05/2012   TRIG 249* 01/05/2012   Lab Results  Component Value Date   DDIMER 0.42 01/04/2012    Basename 01/04/12 1543  TSH 0.576  T4TOTAL --  T3FREE --  THYROIDAB --    Basename 01/04/12 1302  INR 1.06    Radiology/Studies Dg Chest 2 View  01/04/2012  *RADIOLOGY REPORT*  Clinical Data: Chest pain.  Shortness of breath.  Weakness. Diarrhea.  CHEST - 2 VIEW  Comparison: 06/15/2011  Findings: Retro diaphragmatic nodule on the right is known to be calcified, and likely a granuloma.  Borderline cardiomegaly noted with left basilar subsegmental atelectasis or scarring.  Thoracic spondylosis is present.  No pleural effusion noted.  IMPRESSION:  1.  Left basilar subsegmental atelectasis or scarring. 2.  Borderline  cardiomegaly, without edema. 3.  Chronic right lower lobe calcified pulmonary nodule, likely from remote granulomatous disease.   Original Report Authenticated By: Gaylyn Rong, M.D.     Echocardiogram done today, report pending  12-lead ECG shows sinus rhythm without acute ST-T wave abnormalities; unchanged from previous ECGs  Lexiscan Myoview 01/28/2011 Per dictated report by Dr. Shirlee Latch Overall Impression: Low risk stress nuclear study. There is a small fixed apical perfusion defect that may be due to apical thinning. Cannot rule out small prior MI. EF is normal.    Assessment and Plan 1. Chest pain, atypical for angina/ACS 2. HTN 3. Dyslipidemia 4. DM Christian Morris presents with chest pain that seems atypical for angina/ACS.  He states his left sided flank and chest pain is non-exertional and has been constant for 3 days. His 12-lead ECG shows no acute ST-T wave abnormalities and his CEs are negative. He underwent a Lexiscan Myoview January 2013 for similar symptoms which was felt to be a low risk study. There is no further inpatient work-up needed at this time.   Signed, Rick Duff, PA-C 01/05/2012, 1:13 PM  I have personally seen and examined this patient with Rick Duff, PA-C. I agree with the assessment and plan as outlined above. His chest pain is atypical. He has been having this same pain in his left flank for many years. He reports two normal cardiac caths in the past in workup of this same left sided pain. Normal stress myoview earlier this year in our office. EKG is unchanged. Cardiac markers are negative. No further cardiac workup is indicated at this time. I will be glad to f/u in our office. We have arranged an appt for 02/05/11 at 3:30 pm. I would recommend starting a PPI for possible GERD.   Chalese Peach 2:28 PM 01/05/2012

## 2012-01-05 NOTE — Discharge Summary (Signed)
Patient seen and examined. Agree with note by Toya Smothers, NP. Patient has ruled out for ACS. Has been seen by cardiology who is not recommending further cardiac workup. Will be discharged home today. Has a follow up scheduled with [facility] cards for 02/05/12.  Peggye Pitt, MD [facility] Pager: [phone]

## 2012-01-05 NOTE — Progress Notes (Signed)
Pt provided with dc instructions and education. Pt verbalized understanding. Pt has no questions at this time. IV removed with tip intact. Heart monitor cleaned and returned to front. VSS. Pt leaving for home. Levonne Spiller, RN

## 2012-01-05 NOTE — Progress Notes (Signed)
Utilization review completed.  

## 2012-01-05 NOTE — Progress Notes (Signed)
  Echocardiogram 2D Echocardiogram has been performed.  Ellender Hose A 01/05/2012, 1:03 PM

## 2012-01-07 ENCOUNTER — Telehealth: Payer: Self-pay | Admitting: Family

## 2012-01-07 NOTE — Telephone Encounter (Signed)
Patient had already called and scheduled a post hospital follow up for 01/13/12. Should I call him and postpone the appointment for 1 more week?

## 2012-01-07 NOTE — Telephone Encounter (Signed)
No that's fine  thanks

## 2012-01-07 NOTE — Telephone Encounter (Signed)
pls call pt and arrange a 2 week hospital follow up.

## 2012-01-08 ENCOUNTER — Other Ambulatory Visit: Payer: Self-pay | Admitting: *Deleted

## 2012-01-08 MED ORDER — DIAZEPAM 10 MG PO TABS: 10.0000 mg | ORAL_TABLET | Freq: Every day | ORAL | Status: DC | PRN

## 2012-01-08 NOTE — Telephone Encounter (Signed)
Pt called requesting refill of diazepam 10mg . Refill left on [facility] voicemail; #30 x no refills.

## 2012-01-13 ENCOUNTER — Ambulatory Visit (INDEPENDENT_AMBULATORY_CARE_PROVIDER_SITE_OTHER): Payer: Medicare Other | Admitting: Family

## 2012-01-13 ENCOUNTER — Encounter: Payer: Self-pay | Admitting: Family

## 2012-01-13 VITALS — BP 138/72 | HR 63 | Temp 98.0°F | Resp 16 | Ht 70.0 in | Wt 294.1 lb

## 2012-01-13 DIAGNOSIS — R079 Chest pain, unspecified: Secondary | ICD-10-CM

## 2012-01-13 DIAGNOSIS — G4733 Obstructive sleep apnea (adult) (pediatric): Secondary | ICD-10-CM

## 2012-01-13 DIAGNOSIS — F411 Generalized anxiety disorder: Secondary | ICD-10-CM

## 2012-01-13 DIAGNOSIS — E119 Type 2 diabetes mellitus without complications: Secondary | ICD-10-CM

## 2012-01-13 DIAGNOSIS — F419 Anxiety disorder, unspecified: Secondary | ICD-10-CM

## 2012-01-13 MED ORDER — HYDROCODONE-ACETAMINOPHEN 7.5-300 MG PO TABS: 1.0000 | ORAL_TABLET | Freq: Three times a day (TID) | ORAL | Status: DC

## 2012-01-13 NOTE — Patient Instructions (Addendum)
Please follow up in 3 months.  Keep upcoming appointment with Cardiology.

## 2012-01-13 NOTE — Progress Notes (Signed)
Subjective:    Patient ID: Christian Morris, male    DOB: 04-25-1940, 72 y.o.   MRN: [id number]  HPI  Christian Morris is a 72 yr old male who presents today for follow up.  Chest pain- underwent inpatient eval. Records reviewed.  Work up negative- pt has follow up arranged with cardiology.  DM2- 147 yesterday, today 189.  A1C was 7.5.    OSA- he continues his CPAP, but reports that he is still sleepy.    Depression/Anxiety-   Unable to tolerate the half dose of paxil due to night mares.  Returned to original dose and feels better.    Review of Systems    see HPI  Past Medical History  Diagnosis Date  . Arthritis   . Depression   . Diabetes mellitus   . Hypertension   . Hyperlipidemia   . History of gastric ulcer   . Transfusion history     due to bleeding ulcer  . Hypogonadism male 01/11/2011  . Squamous cell carcinoma in situ of skin of forearm 01/23/2011  . Lung nodule     right 16 mm, seen initially 6/12  . GERD (gastroesophageal reflux disease)   . Headache     History   Social History  . Marital Status: Divorced    Spouse Name: N/A    Number of Children: N/A  . Years of Education: N/A   Occupational History  . RETIRED BUS DRIVER    Social History Main Topics  . Smoking status: Former Smoker -- 1.0 packs/day for 25 years    Types: Cigarettes    Quit date: 01/05/1982  . Smokeless tobacco: Never Used  . Alcohol Use: No  . Drug Use: No  . Sexually Active: Not on file   Other Topics Concern  . 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    Past Surgical History  Procedure Date  . Eye surgery 08/2009    cataract removal left eye--Dr Dione Booze  . Melanoma excision     left forearm    Family History  Problem Relation Age of Onset  . Arthritis Mother   . Heart disease Sister     MI  . Heart disease Brother     MI  . Heart disease Brother     MI  . Melanoma Son     Allergies    Allergen Reactions  . Aspirin     REACTION: hx of ulcers    Current Outpatient Prescriptions on File Prior to Visit  Medication Sig Dispense Refill  . aspirin EC 81 MG tablet Take 81 mg by mouth daily.      [name] atorvastatin (LIPITOR) 40 MG tablet Take 40 mg by mouth daily.      . diazepam (VALIUM) 10 MG tablet Take 1 tablet (10 mg total) by mouth daily as needed. For anxiety  30 tablet  0  . diltiazem (CARDIZEM CD) 180 MG 24 hr capsule Take 180 mg by mouth daily.      . fish oil-omega-3 fatty acids 1000 MG capsule Take 2 g by mouth 2 (two) times daily.        . hydrochlorothiazide (HYDRODIURIL) 25 MG tablet Take 25 mg by mouth daily.      [name] losartan (COZAAR) 100 MG tablet Take 100 mg by mouth daily.      . metFORMIN (GLUCOPHAGE) 1000 MG tablet Take 1,000 mg by mouth  2 (two) times daily with a meal.      . Multiple Vitamins-Minerals (OCUVITE ADULT FORMULA) CAPS Take 1 capsule by mouth 2 (two) times daily.      [name] omeprazole (PRILOSEC) 20 MG capsule Take 1 capsule (20 mg total) by mouth daily.      [name] PARoxetine (PAXIL) 30 MG tablet Take 30 mg by mouth daily.        BP 138/72  Pulse 63  Temp 98 F (36.7 C) (Oral)  Resp 16  Ht 5\' 10"  (1.778 m)  Wt 294 lb 1.3 oz (133.394 kg)  BMI 42.20 kg/m2  SpO2 96%    Objective:   Physical Exam  Constitutional: He is oriented to person, place, and time. He appears well-developed and well-nourished. No distress.  HENT:  Head: Normocephalic and atraumatic.  Cardiovascular: Normal rate and regular rhythm.   No murmur heard. Pulmonary/Chest: Effort normal and breath sounds normal. No respiratory distress. He has no wheezes. He has no rales. He exhibits no tenderness.  Musculoskeletal: He exhibits no edema.  Neurological: He is alert and oriented to person, place, and time.  Skin: Skin is warm and dry.  Psychiatric: He has a normal mood and affect. His behavior is normal. Judgment and thought content normal.          Assessment & Plan:

## 2012-01-14 NOTE — Assessment & Plan Note (Signed)
A1C slightly above goal.  Encouraged him to resume exercise regimen and try to watch his diet.

## 2012-01-14 NOTE — Assessment & Plan Note (Signed)
No current CP symptoms.  Pt instructed to keep upcoming apt with cardiology.

## 2012-01-14 NOTE — Assessment & Plan Note (Signed)
Did not tolerate wean of paxil due to nightmares.  He has returned to his prior dose, continue paxil 30mg .

## 2012-01-14 NOTE — Assessment & Plan Note (Signed)
Compliant with CPAP.  Still tired.  Hopefully resuming an exercise routine will help.  TSH is normal.

## 2012-01-17 ENCOUNTER — Other Ambulatory Visit: Payer: Self-pay | Admitting: Family

## 2012-01-22 ENCOUNTER — Telehealth: Payer: Self-pay | Admitting: *Deleted

## 2012-01-22 NOTE — Telephone Encounter (Signed)
Notified pt and he voices understanding. 

## 2012-01-22 NOTE — Telephone Encounter (Signed)
Received call from pt stating he has had increase in back pain since changing to the Lorcet 7.5-300mg . States his pain has been intermittent x 3 weeks but reports that he hurt all night last night. Doesn't feel these are as effective as previous dose. States he is willing to try something else if there is a better option.  Please advise.

## 2012-01-22 NOTE — Telephone Encounter (Signed)
He can take tylenol 325mg  1/2 tab by mouth with each of his lorcet 7.5/300 if he would like.  This will almost be exactly equal to his previous dosing.

## 2012-01-25 ENCOUNTER — Telehealth: Payer: Self-pay | Admitting: *Deleted

## 2012-01-25 ENCOUNTER — Other Ambulatory Visit: Payer: Self-pay | Admitting: Family

## 2012-01-25 NOTE — Telephone Encounter (Signed)
Received call from pt stating his back has been killing him since his hydrocodone-acteaminophen dose was changed. Reports no improvement since adding an extra 1/2 tylenol with current dose and states he has even been taking 1 (500mg ) tablet instead of 1/2.  Pt states it is also time for a refill of hydrocodone unless we would like to change him to something else.  Please advise.

## 2012-01-25 NOTE — Telephone Encounter (Signed)
I do not think that pain is related to the med change.  We can re-evaluate his back pain in the office with some additional imaging to see if anything has changed if he would like. OK to send refill on his pain med for 1 month

## 2012-01-25 NOTE — Telephone Encounter (Signed)
Notified pt of instructions below and he declines appt at this time. He reports that he does not need refill of pain medication at this time as it was filled on 01/14/12 and he was looking at the wrong bottle. Refill not sent at this time.

## 2012-01-29 ENCOUNTER — Telehealth: Payer: Self-pay | Admitting: Family

## 2012-01-29 NOTE — Telephone Encounter (Signed)
Pt reports that dizziness started around the end of December. States that dizziness occurs nearly every day. Seems worse with sitting and standing but has occurred at other times. Feels like the room is spinning. Also notes headache once a week. Per verbal from Provider pt should be seen prior to obtaining a CT scan. Follow up scheduled for 02/01/12 at 9:45am.

## 2012-01-29 NOTE — Telephone Encounter (Signed)
Patient states that he has been dizzy for about a month now and is requesting [name] order him a CT. Pt is requesting this to be done today.

## 2012-02-01 ENCOUNTER — Ambulatory Visit (INDEPENDENT_AMBULATORY_CARE_PROVIDER_SITE_OTHER): Payer: Medicare Other | Admitting: Family

## 2012-02-01 ENCOUNTER — Encounter: Payer: Self-pay | Admitting: Family

## 2012-02-01 VITALS — BP 150/92 | HR 78 | Temp 97.8°F | Resp 16 | Wt 298.0 lb

## 2012-02-01 DIAGNOSIS — H6690 Otitis media, unspecified, unspecified ear: Secondary | ICD-10-CM

## 2012-02-01 DIAGNOSIS — H6693 Otitis media, unspecified, bilateral: Secondary | ICD-10-CM | POA: Insufficient documentation

## 2012-02-01 DIAGNOSIS — Z9109 Other allergy status, other than to drugs and biological substances: Secondary | ICD-10-CM

## 2012-02-01 MED ORDER — AMOXICILLIN-POT CLAVULANATE 875-125 MG PO TABS: 1.0000 | ORAL_TABLET | Freq: Two times a day (BID) | ORAL | Status: DC

## 2012-02-01 NOTE — Patient Instructions (Addendum)
You will be contacted about your referral to the allergist. Please let us know if you have not heard back within 1 week about your referral. Please call if dizziness worsens or if if not improved in 1 week.

## 2012-02-01 NOTE — Assessment & Plan Note (Signed)
Refer to allergy

## 2012-02-01 NOTE — Progress Notes (Signed)
Subjective:    Patient ID: Christian Morris, male    DOB: 07-31-1940, 72 y.o.   MRN: [id number]  HPI  Pt reports intermittent dizziness x 2 months. Pt reports symptoms are worse with rising and also occurs intermittently through the day. Feels "swimmy headed."  Reports some headaches and sinus pressure but denies sinus drainage.  He denies fever.  He denies ear pain.    Environmental allergies- reports chronic itching.  Requests referral to allergist.    Review of Systems    see HPI  Past Medical History  Diagnosis Date  . Arthritis   . Depression   . Diabetes mellitus   . Hypertension   . Hyperlipidemia   . History of gastric ulcer   . Transfusion history     due to bleeding ulcer  . Hypogonadism male 01/11/2011  . Squamous cell carcinoma in situ of skin of forearm 01/23/2011  . Lung nodule     right 16 mm, seen initially 6/12  . GERD (gastroesophageal reflux disease)   . Headache     History   Social History  . Marital Status: Divorced    Spouse Name: N/A    Number of Children: N/A  . Years of Education: N/A   Occupational History  . RETIRED BUS DRIVER    Social History Main Topics  . Smoking status: Former Smoker -- 1.0 packs/day for 25 years    Types: Cigarettes    Quit date: 01/05/1982  . Smokeless tobacco: Never Used  . Alcohol Use: No  . Drug Use: No  . Sexually Active: Not on file   Other Topics Concern  . 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    Past Surgical History  Procedure Date  . Eye surgery 08/2009    cataract removal left eye--Dr Dione Booze  . Melanoma excision     left forearm    Family History  Problem Relation Age of Onset  . Arthritis Mother   . Heart disease Sister     MI  . Heart disease Brother     MI  . Heart disease Brother     MI  . Melanoma Son     Allergies  Allergen Reactions  . Aspirin     REACTION: hx of ulcers  . Dust Mite Extract     . Other     Cockroach, grass and trees    Current Outpatient Prescriptions on File Prior to Visit  Medication Sig Dispense Refill  . aspirin EC 81 MG tablet Take 81 mg by mouth daily.      [name] atorvastatin (LIPITOR) 40 MG tablet TAKE ONE TABLET BY MOUTH EVERY DAY  30 tablet  2  . diazepam (VALIUM) 10 MG tablet Take 1 tablet (10 mg total) by mouth daily as needed. For anxiety  30 tablet  0  . diltiazem (CARDIZEM CD) 180 MG 24 hr capsule Take 180 mg by mouth daily.      . fish oil-omega-3 fatty acids 1000 MG capsule Take 2 g by mouth 2 (two) times daily.        . hydrochlorothiazide (HYDRODIURIL) 25 MG tablet Take 25 mg by mouth daily.      . Hydrocodone-Acetaminophen 7.5-300 MG TABS Take 1 tablet by mouth 3 (three) times daily.  90 each  0  . losartan (COZAAR) 100 MG tablet TAKE ONE TABLET BY MOUTH EVERY DAY  30 tablet  2  . metFORMIN (GLUCOPHAGE) 1000 MG tablet TAKE ONE TABLET BY MOUTH TWICE DAILY WITH MEALS  60 tablet  2  . Multiple Vitamins-Minerals (OCUVITE ADULT FORMULA) CAPS Take 1 capsule by mouth 2 (two) times daily.      [name] omeprazole (PRILOSEC) 20 MG capsule Take 1 capsule (20 mg total) by mouth daily.      [name] PARoxetine (PAXIL) 30 MG tablet Take 30 mg by mouth daily.        BP 118/70  Pulse 65  Temp 97.8 F (36.6 C) (Oral)  Resp 16  Wt 298 lb 0.6 oz (135.19 kg)  SpO2 96%    Objective:   Physical Exam  Constitutional: He is oriented to person, place, and time. He appears well-developed and well-nourished. No distress.  HENT:  Head: Normocephalic and atraumatic.  Right Ear: Tympanic membrane and ear canal normal.  Left Ear: Tympanic membrane and ear canal normal.  Mouth/Throat: No oropharyngeal exudate, posterior oropharyngeal edema or posterior oropharyngeal erythema.  Cardiovascular: Normal rate and regular rhythm.   No murmur heard. Pulmonary/Chest: Effort normal. No respiratory distress. He has no wheezes. He has no rales. He exhibits no tenderness.   Musculoskeletal: He exhibits no edema.  Lymphadenopathy:    He has no cervical adenopathy.  Neurological: He is alert and oriented to person, place, and time.  Skin: Skin is warm and dry.  Psychiatric: He has a normal mood and affect. His behavior is normal. Judgment and thought content normal.          Assessment & Plan:

## 2012-02-01 NOTE — Assessment & Plan Note (Addendum)
Will rx with augmentin.  Not orthostatic.  May be cause for dizziness.  If no improvement consider rx for vertigo.

## 2012-02-03 ENCOUNTER — Telehealth: Payer: Self-pay | Admitting: *Deleted

## 2012-02-03 NOTE — Telephone Encounter (Signed)
Pt called wanting to know the status of his application for disability parking placard that he dropped off yesterday. Advised pt form was received but has not been signed yet and we will call him once it is ready for pick up. Form forwarded to Provider for completion and signature.

## 2012-02-04 ENCOUNTER — Encounter: Payer: Self-pay | Admitting: *Deleted

## 2012-02-04 ENCOUNTER — Telehealth: Payer: Self-pay | Admitting: *Deleted

## 2012-02-04 NOTE — Telephone Encounter (Signed)
Pt requested refill of Diazepam. Rx last called in on 01/08/12. Advised pt rx will be phoned in on 02/05/12.

## 2012-02-04 NOTE — Telephone Encounter (Signed)
Opened in error

## 2012-02-04 NOTE — Telephone Encounter (Signed)
Notified pt, form is completed and ready for pick up at the front desk. Copy sent for scanning.

## 2012-02-05 ENCOUNTER — Encounter: Payer: Medicare Other | Admitting: Cardiovascular Disease

## 2012-02-05 MED ORDER — DIAZEPAM 10 MG PO TABS: 10.0000 mg | ORAL_TABLET | Freq: Every day | ORAL | Status: DC | PRN

## 2012-02-05 NOTE — Telephone Encounter (Signed)
Diazepam 10mg  #30 x no refills called to [facility] voicemail.

## 2012-02-07 ENCOUNTER — Other Ambulatory Visit: Payer: Self-pay | Admitting: Family

## 2012-02-08 ENCOUNTER — Ambulatory Visit (INDEPENDENT_AMBULATORY_CARE_PROVIDER_SITE_OTHER): Payer: Medicare Other | Admitting: Family

## 2012-02-08 ENCOUNTER — Encounter: Payer: Self-pay | Admitting: Family

## 2012-02-08 VITALS — BP 134/64 | HR 65 | Temp 97.7°F | Resp 16 | Ht 70.0 in | Wt 299.0 lb

## 2012-02-08 DIAGNOSIS — H6693 Otitis media, unspecified, bilateral: Secondary | ICD-10-CM

## 2012-02-08 DIAGNOSIS — H6690 Otitis media, unspecified, unspecified ear: Secondary | ICD-10-CM

## 2012-02-08 MED ORDER — HYDROCHLOROTHIAZIDE 25 MG PO TABS: 25.0000 mg | ORAL_TABLET | Freq: Every day | ORAL | Status: DC

## 2012-02-08 NOTE — Patient Instructions (Addendum)
Please follow up in 3 months.  

## 2012-02-08 NOTE — Progress Notes (Signed)
Subjective:    Patient ID: Christian Morris, male    DOB: 1940-07-18, 73 y.o.   MRN: [id number]  HPI  Christian Morris is a 72 yr old male who presents today for follow up of his dizziness.  He was found last visit to have bilateral OM and was placed on augmentin.  He continues augmentin and notes improvement in his dizziness.    Review of Systems See HPI  . Past Medical History  Diagnosis Date  . Arthritis   . Depression   . Diabetes mellitus   . Hypertension   . Hyperlipidemia   . History of gastric ulcer   . Transfusion history     due to bleeding ulcer  . Hypogonadism male 01/11/2011  . Squamous cell carcinoma in situ of skin of forearm 01/23/2011  . Lung nodule     right 16 mm, seen initially 6/12  . GERD (gastroesophageal reflux disease)   . Headache     History   Social History  . Marital Status: Divorced    Spouse Name: N/A    Number of Children: N/A  . Years of Education: N/A   Occupational History  . RETIRED BUS DRIVER    Social History Main Topics  . Smoking status: Former Smoker -- 1.0 packs/day for 25 years    Types: Cigarettes    Quit date: 01/05/1982  . Smokeless tobacco: Never Used  . Alcohol Use: No  . Drug Use: No  . Sexually Active: Not on file   Other Topics Concern  . 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    Past Surgical History  Procedure Date  . Eye surgery 08/2009    cataract removal left eye--Dr Dione Booze  . Melanoma excision     left forearm    Family History  Problem Relation Age of Onset  . Arthritis Mother   . Heart disease Sister     MI  . Heart disease Brother     MI  . Heart disease Brother     MI  . Melanoma Son     Allergies  Allergen Reactions  . Aspirin     REACTION: hx of ulcers  . Dust Mite Extract   . Other     Cockroach, grass and trees    Current Outpatient Prescriptions on File Prior to Visit  Medication Sig Dispense Refill   . amoxicillin-clavulanate (AUGMENTIN) 875-125 MG per tablet Take 1 tablet by mouth 2 (two) times daily.  20 tablet  0  . aspirin EC 81 MG tablet Take 81 mg by mouth daily.      [name] atorvastatin (LIPITOR) 40 MG tablet TAKE ONE TABLET BY MOUTH EVERY DAY  30 tablet  2  . diazepam (VALIUM) 10 MG tablet Take 1 tablet (10 mg total) by mouth daily as needed. For anxiety  30 tablet  0  . diltiazem (CARDIZEM CD) 180 MG 24 hr capsule Take 180 mg by mouth daily.      . fish oil-omega-3 fatty acids 1000 MG capsule Take 2 g by mouth 2 (two) times daily.        . Hydrocodone-Acetaminophen 7.5-300 MG TABS Take 1 tablet by mouth 3 (three) times daily.  90 each  0  . losartan (COZAAR) 100 MG tablet TAKE ONE TABLET BY MOUTH EVERY DAY  30 tablet  2  . metFORMIN (GLUCOPHAGE) 1000 MG tablet TAKE ONE TABLET  BY MOUTH TWICE DAILY WITH MEALS  60 tablet  2  . Multiple Vitamins-Minerals (OCUVITE ADULT FORMULA) CAPS Take 1 capsule by mouth 2 (two) times daily.      [name] omeprazole (PRILOSEC) 20 MG capsule Take 1 capsule (20 mg total) by mouth daily.      [name] PARoxetine (PAXIL) 30 MG tablet Take 30 mg by mouth daily.        BP 134/64  Pulse 65  Temp 97.7 F (36.5 C) (Oral)  Resp 16  Ht 5\' 10"  (1.778 m)  Wt 299 lb (135.626 kg)  BMI 42.90 kg/m2  SpO2 95%       Objective:   Physical Exam  Constitutional: He appears well-developed and well-nourished. No distress.  HENT:  Head: Normocephalic and atraumatic.  Right Ear: Tympanic membrane and ear canal normal.  Left Ear: Ear canal normal.       Mild erythema left TM, improved, no bulging.  Cardiovascular:  No murmur heard.         Assessment & Plan:

## 2012-02-08 NOTE — Assessment & Plan Note (Signed)
Improved. Was likely cause for his dizziness which is now resolved.  I have asked the pt to complete antibiotics.

## 2012-02-10 ENCOUNTER — Encounter: Payer: Self-pay | Admitting: Physician Assistant

## 2012-02-10 ENCOUNTER — Ambulatory Visit (INDEPENDENT_AMBULATORY_CARE_PROVIDER_SITE_OTHER): Payer: Medicare Other | Admitting: Physician Assistant

## 2012-02-10 VITALS — BP 128/74 | HR 60 | Ht 72.0 in | Wt 297.2 lb

## 2012-02-10 DIAGNOSIS — I10 Essential (primary) hypertension: Secondary | ICD-10-CM

## 2012-02-10 DIAGNOSIS — E785 Hyperlipidemia, unspecified: Secondary | ICD-10-CM

## 2012-02-10 DIAGNOSIS — K219 Gastro-esophageal reflux disease without esophagitis: Secondary | ICD-10-CM

## 2012-02-10 DIAGNOSIS — R079 Chest pain, unspecified: Secondary | ICD-10-CM

## 2012-02-10 NOTE — Assessment & Plan Note (Signed)
Chest pain resolved. It was felt to be GI in origin and has improved with Prilosec. He the risk Myoview last year. No further cardiac workup at this time.

## 2012-02-10 NOTE — Patient Instructions (Addendum)
Wynell Balloon, PA-C recommends you see a gastroenterologist for choking and indigestion.  Your physician wants you to follow-up in: 1 year with Dr Clifton James, sooner if you have problems.   You will receive a reminder letter in the mail two months in advance. If you don't receive a letter, please call our office to schedule the follow-up appointment.

## 2012-02-10 NOTE — Assessment & Plan Note (Addendum)
Patient also complains of choking sensation when he is dairy products. I recommend he see a GI specialist but he wants to hold off for now.

## 2012-02-10 NOTE — Assessment & Plan Note (Signed)
Recommend weight loss program 

## 2012-02-10 NOTE — Assessment & Plan Note (Signed)
Stable

## 2012-02-10 NOTE — Assessment & Plan Note (Signed)
Most recent labs a month ago were stable

## 2012-02-10 NOTE — Progress Notes (Signed)
HPI:   This is a 72 year old white male patient who is here for post hospital followup. He was admitted with atypical chest pain that was felt to be GI related. He has a history of 2 prior normal cardiac catheterizations in the past and [facility] January 2013 was felt to be a low-risk study. No further cardiac workup was recommended.  The patient denies any further chest pain since Prilosec was started. He does complain of a choking sensation when he eats dairy products but he has had this problem for years. He says he exercises for an hour a day at the [facility] and has no symptoms. He also has hypertension, hyperlipidemia, and diabetes mellitus and is followed by Sandford Craze, NP   Allergies: -- Aspirin    --  REACTION: hx of ulcers  -- Dust Mite Extract   -- Other    --  Cockroach, grass and trees  Current Outpatient Prescriptions on File Prior to Visit: amoxicillin-clavulanate (AUGMENTIN) 875-125 MG per tablet, Take 1 tablet by mouth 2 (two) times daily., Disp: 20 tablet, Rfl: 0 aspirin EC 81 MG tablet, Take 81 mg by mouth daily., Disp: , Rfl:  atorvastatin (LIPITOR) 40 MG tablet, TAKE ONE TABLET BY MOUTH EVERY DAY, Disp: 30 tablet, Rfl: 2 diazepam (VALIUM) 10 MG tablet, Take 1 tablet (10 mg total) by mouth daily as needed. For anxiety, Disp: 30 tablet, Rfl: 0 diltiazem (CARDIZEM CD) 180 MG 24 hr capsule, Take 180 mg by mouth daily., Disp: , Rfl:  fish oil-omega-3 fatty acids 1000 MG capsule, Take 2 g by mouth 2 (two) times daily.  , Disp: , Rfl:  hydrochlorothiazide (HYDRODIURIL) 25 MG tablet, TAKE ONE TABLET BY MOUTH EVERY DAY, Disp: 30 tablet, Rfl: 1 hydrochlorothiazide (HYDRODIURIL) 25 MG tablet, Take 1 tablet (25 mg total) by mouth daily., Disp: 30 tablet, Rfl: 3 Hydrocodone-Acetaminophen 7.5-300 MG TABS, Take 1 tablet by mouth 3 (three) times daily., Disp: 90 each, Rfl: 0 losartan (COZAAR) 100 MG tablet, TAKE ONE TABLET BY MOUTH EVERY DAY, Disp: 30 tablet, Rfl: 2 metFORMIN  (GLUCOPHAGE) 1000 MG tablet, TAKE ONE TABLET BY MOUTH TWICE DAILY WITH MEALS, Disp: 60 tablet, Rfl: 2 omeprazole (PRILOSEC) 20 MG capsule, Take 1 capsule (20 mg total) by mouth daily., Disp: , Rfl:  PARoxetine (PAXIL) 30 MG tablet, Take 30 mg by mouth daily., Disp: , Rfl:     Past Medical History:   Arthritis                                                    Depression                                                   Diabetes mellitus                                            Hypertension  Hyperlipidemia                                               History of gastric ulcer                                     Transfusion history                                            Comment:due to bleeding ulcer   Hypogonadism male                               01/11/2011     Squamous cell carcinoma in situ of skin of for* 01/23/2011    Lung nodule                                                    Comment:right 16 mm, seen initially 6/12   GERD (gastroesophageal reflux disease)                       Headache                                                    Past Surgical History:   EYE SURGERY                                     08/2009         Comment:cataract removal left eye--Dr Dione Booze   MELANOMA EXCISION                                              Comment:left forearm  Review of patient's family history indicates:   Arthritis                      Mother                   Heart disease                  Sister                     Comment: MI   Heart disease                  Brother                    Comment: MI   Heart disease                  Brother  Comment: MI   Melanoma                       Son                      Social History   Marital Status: Divorced            Spouse Name:                      Years of Education:                 Number of children:             Occupational History Occupation           Associate Professor            Comment              RETIRED BUS DRIVER                        Social History Main Topics   Smoking Status: Former Smoker                   Packs/Day: 1     Years: 25        Types: Cigarettes     Quit date: 01/05/1982   Smokeless Status: Never Used                       Alcohol Use: No             Drug Use: No             Sexual Activity: Not on file        Other Topics            Concern   None on file  Social History Narrative   Regular exercise:  Yes   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    ROS:see history of present illness otherwise negative  PHYSICAL EXAM: Obese, in no acute distress. Neck: No JVD, HJR, Bruit, or thyroid enlargement  Lungs: No tachypnea, clear without wheezing, rales, or rhonchi  Cardiovascular: RRR, PMI not displaced, heart sounds distant, no murmurs, gallops, bruit, thrill, or heave.  Abdomen: BS normal. Soft without organomegaly, masses, lesions or tenderness.  Extremities: without cyanosis, clubbing or edema. Good distal pulses bilateral  SKin: Warm, no lesions or rashes   Musculoskeletal: No deformities  Neuro: no focal signs  BP 128/74  Pulse 60  Ht 6' (1.829 m)  Wt 297 lb 3.2 oz (134.809 kg)  BMI 40.31 kg/m2   ZOX:WRUEA bradycardia at 59 beats per minute with nonspecific ST-T wave changes, no acute change  Overall Impression:01/28/11:  Low risk stress nuclear study.  There is a small fixed apical perfusion defect that may be due to apical thinning.  Cannot rule out small prior MI.  EF is normal.   Marca Ancona

## 2012-02-12 ENCOUNTER — Telehealth: Payer: Self-pay | Admitting: *Deleted

## 2012-02-12 MED ORDER — HYDROCODONE-ACETAMINOPHEN 7.5-300 MG PO TABS: 1.0000 | ORAL_TABLET | Freq: Three times a day (TID) | ORAL | Status: DC

## 2012-02-12 NOTE — Telephone Encounter (Signed)
Pt called requesting refill of Hydrocodone acetaminophen 7.5-300mg . Last Rx called in 01/13/12. Advised pt to check with pharmacy this weekend as Rx would be called in later on today. Rx called to pharmacy voicemail.

## 2012-02-17 ENCOUNTER — Telehealth: Payer: Self-pay | Admitting: Family

## 2012-02-17 MED ORDER — DILTIAZEM HCL ER COATED BEADS 180 MG PO CP24: 180.0000 mg | ORAL_CAPSULE | Freq: Every day | ORAL | Status: DC

## 2012-02-17 NOTE — Telephone Encounter (Signed)
Patient left message on nurses voicemail stating that he is out of his heart pill? He says that it is the pill that he has been taking for a long time. On message he did not say the name of pill.

## 2012-02-19 ENCOUNTER — Institutional Professional Consult (permissible substitution): Payer: Medicare Other | Admitting: Internal Medicine

## 2012-03-01 ENCOUNTER — Telehealth: Payer: Self-pay | Admitting: *Deleted

## 2012-03-01 NOTE — Telephone Encounter (Signed)
Ok to send #90 with zero refills.  

## 2012-03-01 NOTE — Telephone Encounter (Signed)
Patient is requesting refill for his hydrocodone

## 2012-03-02 ENCOUNTER — Ambulatory Visit: Payer: Medicare Other | Admitting: Family

## 2012-03-04 ENCOUNTER — Telehealth: Payer: Self-pay | Admitting: Family

## 2012-03-04 MED ORDER — HYDROCODONE-ACETAMINOPHEN 7.5-300 MG PO TABS: 1.0000 | ORAL_TABLET | Freq: Three times a day (TID) | ORAL | Status: DC

## 2012-03-04 NOTE — Telephone Encounter (Signed)
Patient left message on nurse voicemail at 8:58am stating that he would like a refill of his pain pills sent to the pharmacy. He says that he knows that it is a few days early for refill, but he says that his son is in critical condition and he has to go to [place], [place] and does not know how long he will be gone.

## 2012-03-04 NOTE — Telephone Encounter (Signed)
Last rx called in on 02/12/12 please advise.

## 2012-03-04 NOTE — Telephone Encounter (Signed)
OK 

## 2012-03-04 NOTE — Telephone Encounter (Signed)
Rx called to pharmacy voicemail. Pt notified. 

## 2012-03-15 ENCOUNTER — Ambulatory Visit: Payer: Medicare Other | Admitting: Family

## 2012-03-23 ENCOUNTER — Other Ambulatory Visit: Payer: Self-pay | Admitting: Family

## 2012-03-23 ENCOUNTER — Telehealth: Payer: Self-pay | Admitting: *Deleted

## 2012-03-23 MED ORDER — DIAZEPAM 10 MG PO TABS: 10.0000 mg | ORAL_TABLET | Freq: Every day | ORAL | Status: DC | PRN

## 2012-03-23 NOTE — Telephone Encounter (Signed)
Received message from pt requesting refill on diazepam. Rx last called in 02/05/11, #30. Refill left on pharmacy voicemail, #30 x no refills. Notified pt.

## 2012-03-23 NOTE — Telephone Encounter (Signed)
Denied-Rx Phoned In 03.19.14 #[id number]/SLS

## 2012-03-28 ENCOUNTER — Telehealth: Payer: Self-pay | Admitting: *Deleted

## 2012-03-28 NOTE — Telephone Encounter (Signed)
Received message from pt stating reduced lortab strength is not controlling his back and knee pains. Pt requested stronger medication. Advised pt per 01/2012 phone note that Provider recommended follow up in the office for possible imaging studies to see if anything has changed. Pt scheduled appt for 03/30/12 but states that he is not interested in surgery as he has been told previously that surgery may be needed but could not be guaranteed to alleviate symptoms.

## 2012-03-28 NOTE — Telephone Encounter (Signed)
Noted  

## 2012-03-29 ENCOUNTER — Telehealth: Payer: Self-pay | Admitting: *Deleted

## 2012-03-29 NOTE — Telephone Encounter (Signed)
I would not recommend use of advil as this can cause problems with his kidneys.

## 2012-03-29 NOTE — Telephone Encounter (Signed)
Patient informed, understood & agreed; will keep f/u appt/SLS

## 2012-03-29 NOTE — Telephone Encounter (Signed)
Received message from pt requesting refill of lortab. Rx last called in 03/04/12. #90 x no refills. Left auth. on voicemail for #90 x no refills. Pt stated that he has been taking advil instead of tylenol and it seems to be working better than the tylenol.

## 2012-03-30 ENCOUNTER — Ambulatory Visit: Payer: Medicare Other | Admitting: Family

## 2012-04-14 ENCOUNTER — Other Ambulatory Visit: Payer: Self-pay | Admitting: Family

## 2012-04-15 NOTE — Telephone Encounter (Signed)
Rx request to pharmacy/SLS  

## 2012-04-22 ENCOUNTER — Other Ambulatory Visit: Payer: Self-pay | Admitting: Family

## 2012-04-23 ENCOUNTER — Other Ambulatory Visit: Payer: Self-pay | Admitting: Family

## 2012-04-25 ENCOUNTER — Encounter: Payer: Self-pay | Admitting: Family

## 2012-04-27 ENCOUNTER — Encounter: Payer: Self-pay | Admitting: Family

## 2012-04-27 ENCOUNTER — Ambulatory Visit (INDEPENDENT_AMBULATORY_CARE_PROVIDER_SITE_OTHER): Payer: Medicare Other | Admitting: Family

## 2012-04-27 VITALS — BP 120/80 | HR 64 | Temp 97.7°F | Resp 18 | Ht 70.0 in

## 2012-04-27 DIAGNOSIS — E785 Hyperlipidemia, unspecified: Secondary | ICD-10-CM

## 2012-04-27 DIAGNOSIS — M545 Low back pain: Secondary | ICD-10-CM

## 2012-04-27 DIAGNOSIS — I10 Essential (primary) hypertension: Secondary | ICD-10-CM

## 2012-04-27 DIAGNOSIS — E119 Type 2 diabetes mellitus without complications: Secondary | ICD-10-CM

## 2012-04-27 LAB — HEMOGLOBIN A1C: Hgb A1c MFr Bld: 7.7 % — ABNORMAL HIGH (ref <5.7)

## 2012-04-27 LAB — HEPATIC FUNCTION PANEL
Albumin: 4.9 g/dL (ref 3.5–5.2)
Alkaline Phosphatase: 64 U/L (ref 39–117)
Bilirubin, Direct: 0.1 mg/dL (ref 0.0–0.3)
Total Bilirubin: 0.6 mg/dL (ref 0.3–1.2)

## 2012-04-27 LAB — BASIC METABOLIC PANEL
BUN: 27 mg/dL — ABNORMAL HIGH (ref 6–23)
Calcium: 10.1 mg/dL (ref 8.4–10.5)
Glucose, Bld: 114 mg/dL — ABNORMAL HIGH (ref 70–99)
Sodium: 142 mEq/L (ref 135–145)

## 2012-04-27 MED ORDER — HYDROCODONE-ACETAMINOPHEN 7.5-300 MG PO TABS: 1.0000 | ORAL_TABLET | Freq: Three times a day (TID) | ORAL | Status: DC

## 2012-04-27 MED ORDER — DIAZEPAM 10 MG PO TABS: 10.0000 mg | ORAL_TABLET | Freq: Every day | ORAL | Status: DC | PRN

## 2012-04-27 MED ORDER — SILDENAFIL CITRATE 100 MG PO TABS: 100.0000 mg | ORAL_TABLET | Freq: Every day | ORAL | Status: DC | PRN

## 2012-04-27 NOTE — Patient Instructions (Addendum)
Please complete lab work prior to leaving. Follow up in 3 months, sooner if problems/concerns.  

## 2012-04-27 NOTE — Assessment & Plan Note (Signed)
Deteriorated.  I told pt that I am not comfortable increasing his narcotic dosage.  I offered referral to pain management and PT. He declines both. I will take what you give me.

## 2012-04-27 NOTE — Assessment & Plan Note (Signed)
Clinically stable, continue metformin. Obtain bmet, a1c.

## 2012-04-27 NOTE — Assessment & Plan Note (Signed)
BP Readings from Last 3 Encounters:  04/27/12 120/80  02/10/12 128/74  02/08/12 134/64   BP stable on cardizem, hctz, losartan.

## 2012-04-27 NOTE — Progress Notes (Signed)
Subjective:    Patient ID: Christian Morris, male    DOB: Jul 05, 1940, 72 y.o.   MRN: [id number]  HPI  Christian Morris is a 72 yr old male who presents today for follow up.  1) DM2- He is currently maintained on metformin.  Reports sugars are "fluctuating."    2) HTN- currently on losartan and HCTZ.  Denies chest pain, sob, or swelling.   3) Back Pain- Currently on lortab 7.5/325mg .   Previous he was on 7.5/500 but this dose has been discontinued.       Review of Systems See HPI  Past Medical History  Diagnosis Date  . Arthritis   . Depression   . Diabetes mellitus   . Hypertension   . Hyperlipidemia   . History of gastric ulcer   . Transfusion history     due to bleeding ulcer  . Hypogonadism male 01/11/2011  . Squamous cell carcinoma in situ of skin of forearm 01/23/2011  . Lung nodule     right 16 mm, seen initially 6/12  . GERD (gastroesophageal reflux disease)   . Headache     History   Social History  . Marital Status: Divorced    Spouse Name: N/A    Number of Children: N/A  . Years of Education: N/A   Occupational History  . RETIRED BUS DRIVER    Social History Main Topics  . Smoking status: Former Smoker -- 1.00 packs/day for 25 years    Types: Cigarettes    Quit date: 01/05/1982  . Smokeless tobacco: Never Used  . Alcohol Use: No  . Drug Use: No  . Sexually Active: Not on file   Other Topics Concern  . 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    Past Surgical History  Procedure Laterality Date  . Eye surgery  08/2009    cataract removal left eye--Dr Dione Booze  . Melanoma excision      left forearm    Family History  Problem Relation Age of Onset  . Arthritis Mother   . Heart disease Sister     MI  . Heart disease Brother     MI  . Heart disease Brother     MI  . Melanoma Son     Allergies  Allergen Reactions  . Aspirin     REACTION: hx of ulcers  . Dust  Mite Extract   . Other     Cockroach, grass and trees    Current Outpatient Prescriptions on File Prior to Visit  Medication Sig Dispense Refill  . aspirin EC 81 MG tablet Take 81 mg by mouth daily.      [name] atorvastatin (LIPITOR) 40 MG tablet TAKE ONE TABLET BY MOUTH EVERY DAY  30 tablet  0  . diazepam (VALIUM) 10 MG tablet Take 1 tablet (10 mg total) by mouth daily as needed. For anxiety  30 tablet  0  . diltiazem (CARDIZEM CD) 180 MG 24 hr capsule Take 1 capsule (180 mg total) by mouth daily.  30 capsule  3  . fish oil-omega-3 fatty acids 1000 MG capsule Take 2 g by mouth 2 (two) times daily.        . hydrochlorothiazide (HYDRODIURIL) 25 MG tablet Take 1 tablet (25 mg total) by mouth daily.  30 tablet  3  . Hydrocodone-Acetaminophen 7.5-300 MG TABS Take 1 tablet by mouth 3 (three) times  daily.  90 each  0  . losartan (COZAAR) 100 MG tablet TAKE ONE TABLET BY MOUTH EVERY DAY  30 tablet  0  . metFORMIN (GLUCOPHAGE) 1000 MG tablet TAKE ONE TABLET BY MOUTH TWICE DAILY WITH MEALS  60 tablet  2  . omeprazole (PRILOSEC) 20 MG capsule Take 1 capsule (20 mg total) by mouth daily.      [name] PARoxetine (PAXIL) 30 MG tablet TAKE ONE TABLET BY MOUTH EVERY DAY  30 tablet  0   No current facility-administered medications on file prior to visit.    BP 120/80  Pulse 64  Temp(Src) 97.7 F (36.5 C) (Oral)  Resp 18  Ht 5\' 10"  (1.778 m)  SpO2 96%       Objective:   Physical Exam  Constitutional: He is oriented to person, place, and time. He appears well-developed and well-nourished. No distress.  Cardiovascular: Normal rate and regular rhythm.   No murmur heard. Pulmonary/Chest: Effort normal and breath sounds normal. No respiratory distress. He has no wheezes. He has no rales. He exhibits no tenderness.  Musculoskeletal: He exhibits no edema.  Neurological: He is alert and oriented to person, place, and time.  Psychiatric: He has a normal mood and affect. His behavior is normal. Judgment and  thought content normal.          Assessment & Plan:

## 2012-04-29 ENCOUNTER — Telehealth: Payer: Self-pay | Admitting: Family

## 2012-04-29 MED ORDER — SITAGLIPTIN PHOSPHATE 100 MG PO TABS: 100.0000 mg | ORAL_TABLET | Freq: Every day | ORAL | Status: DC

## 2012-04-29 NOTE — Telephone Encounter (Signed)
Pls call pt and let him know that his kidney function has worsened.  Is he taking NSAIDS? If so he should stop.  He should also stop metformin due to his renal function.  Instead I would like him to start Januvia and work hard on diabetic diet as A1C is above goal.

## 2012-04-29 NOTE — Telephone Encounter (Signed)
Notified pt of instructions below. He states he takes tylenol and sometimes has taken advil. Advised pt to stop NSAIDS (advil) and metformin and start Januvia. Pt voices understanding.

## 2012-05-05 ENCOUNTER — Other Ambulatory Visit: Payer: Self-pay | Admitting: Family

## 2012-05-09 ENCOUNTER — Ambulatory Visit: Payer: Medicare Other | Admitting: Family

## 2012-05-23 ENCOUNTER — Other Ambulatory Visit: Payer: Self-pay | Admitting: Family

## 2012-05-23 MED ORDER — LOSARTAN POTASSIUM 100 MG PO TABS: ORAL_TABLET | ORAL | Status: DC

## 2012-05-23 NOTE — Telephone Encounter (Signed)
Please advise refill? 

## 2012-05-23 NOTE — Telephone Encounter (Signed)
Patient states that he didn't realize that he was supposed to take [place] once daily, he has been taking one pill twice daily and is out of medication. He says that the pharmacy will not refill the medication because its too soon.   Also, he needs another refill of losartan

## 2012-05-23 NOTE — Telephone Encounter (Signed)
Do we have januvia samples? Losartan refill was sent.

## 2012-05-24 NOTE — Telephone Encounter (Signed)
Spoke to pt and advised him that we are out of Januvia samples. He states that he has enough for 3 more days. Advised to check back with pharmacy at that time and see if it will clear with the insurance as rx was last filled on 04/29/12. Pt voices understanding.

## 2012-05-26 ENCOUNTER — Other Ambulatory Visit: Payer: Self-pay | Admitting: Family

## 2012-05-26 ENCOUNTER — Telehealth: Payer: Self-pay | Admitting: Family

## 2012-05-26 MED ORDER — DILTIAZEM HCL ER COATED BEADS 180 MG PO CP24: 180.0000 mg | ORAL_CAPSULE | Freq: Every day | ORAL | Status: DC

## 2012-05-26 NOTE — Telephone Encounter (Signed)
Refill sent.

## 2012-05-26 NOTE — Telephone Encounter (Signed)
OK to send refills as below.  

## 2012-05-26 NOTE — Telephone Encounter (Signed)
Patient is requesting a refill of cardizem to be sent to [facility] pharmacy

## 2012-05-27 ENCOUNTER — Telehealth: Payer: Self-pay | Admitting: Family

## 2012-05-27 MED ORDER — HYDROCHLOROTHIAZIDE 25 MG PO TABS: 25.0000 mg | ORAL_TABLET | Freq: Every day | ORAL | Status: DC

## 2012-05-27 NOTE — Telephone Encounter (Signed)
Patient states that he needs diazepam and hydrochlorothiazide refilled. Please send refills to [facility] pharmacy

## 2012-05-27 NOTE — Telephone Encounter (Signed)
Diazepam already sent in to pharmacy. Sent in hctz.

## 2012-05-27 NOTE — Telephone Encounter (Signed)
Rx's were phoned in to pharmacy.

## 2012-06-06 ENCOUNTER — Other Ambulatory Visit: Payer: Self-pay | Admitting: Family

## 2012-06-14 ENCOUNTER — Encounter: Payer: Self-pay | Admitting: Family

## 2012-06-14 ENCOUNTER — Ambulatory Visit (INDEPENDENT_AMBULATORY_CARE_PROVIDER_SITE_OTHER): Payer: Medicare Other | Admitting: Family

## 2012-06-14 ENCOUNTER — Ambulatory Visit (HOSPITAL_BASED_OUTPATIENT_CLINIC_OR_DEPARTMENT_OTHER)
Admission: RE | Admit: 2012-06-14 | Discharge: 2012-06-14 | Disposition: A | Discharge status: Home / Self Care | Payer: Medicare Other | Source: Ambulatory Visit | Attending: Family | Admitting: Family

## 2012-06-14 VITALS — BP 118/76 | HR 59 | Temp 97.7°F | Resp 18 | Wt 289.0 lb

## 2012-06-14 DIAGNOSIS — R3129 Other microscopic hematuria: Secondary | ICD-10-CM

## 2012-06-14 DIAGNOSIS — Q762 Congenital spondylolisthesis: Secondary | ICD-10-CM | POA: Insufficient documentation

## 2012-06-14 DIAGNOSIS — R109 Unspecified abdominal pain: Secondary | ICD-10-CM | POA: Insufficient documentation

## 2012-06-14 DIAGNOSIS — N200 Calculus of kidney: Secondary | ICD-10-CM | POA: Insufficient documentation

## 2012-06-14 DIAGNOSIS — R21 Rash and other nonspecific skin eruption: Secondary | ICD-10-CM

## 2012-06-14 DIAGNOSIS — M5137 Other intervertebral disc degeneration, lumbosacral region: Secondary | ICD-10-CM | POA: Insufficient documentation

## 2012-06-14 DIAGNOSIS — M51379 Other intervertebral disc degeneration, lumbosacral region without mention of lumbar back pain or lower extremity pain: Secondary | ICD-10-CM | POA: Insufficient documentation

## 2012-06-14 DIAGNOSIS — E119 Type 2 diabetes mellitus without complications: Secondary | ICD-10-CM | POA: Insufficient documentation

## 2012-06-14 DIAGNOSIS — I10 Essential (primary) hypertension: Secondary | ICD-10-CM | POA: Insufficient documentation

## 2012-06-14 DIAGNOSIS — R51 Headache: Secondary | ICD-10-CM

## 2012-06-14 LAB — POCT URINALYSIS DIPSTICK
Glucose, UA: 100
Ketones, UA: NEGATIVE
pH, UA: 6

## 2012-06-14 MED ORDER — CIPROFLOXACIN HCL 250 MG PO TABS
250.0000 mg | ORAL_TABLET | Freq: Two times a day (BID) | ORAL | Status: DC
Start: 2012-06-14 — End: 2012-10-08

## 2012-06-14 MED ORDER — BETAMETHASONE DIPROPIONATE 0.05 % EX CREA: TOPICAL_CREAM | Freq: Two times a day (BID) | CUTANEOUS | Status: DC

## 2012-06-14 NOTE — Progress Notes (Signed)
Subjective:    Patient ID: Christian Morris, male    DOB: [birth year], 72 y.o.   MRN: [id number]  HPI  Christian Morris is a 72 yr old male who presents today with several concerns.  1) Left sided flank pain- worse with deep breath. Reports that this occurs often but was "bad this AM." Reports mild dysuria x 2-3 days. Not burning today.  Denies fever. Denies hematuria.    2) Frequent headache- occurs at night. HA is worsened by standing and is associated with dizziness. Reports that he occasionally has HA "real bad" on the right side.   Reports that these headaches started a few months ago.  "figured that it was allergies."  Reports HA's are intermittent and he rates as 5-6/10.    3) Rash- reports itchy rash on left forearm and right side of chest.  Has been present x 3-4 weeks and is improved slightly by allegra.    Review of Systems See HPI  Past Medical History  Diagnosis Date  . Arthritis   . Depression   . Diabetes mellitus   . Hypertension   . Hyperlipidemia   . History of gastric ulcer   . Transfusion history     due to bleeding ulcer  . Hypogonadism male 01/11/2011  . Squamous cell carcinoma in situ of skin of forearm 01/23/2011  . Lung nodule     right 16 mm, seen initially 6/12  . GERD (gastroesophageal reflux disease)   . Headache(784.0)     History   Social History  . Marital Status: Divorced    Spouse Name: N/A    Number of Children: N/A  . Years of Education: N/A   Occupational History  . RETIRED BUS DRIVER    Social History Main Topics  . Smoking status: Former Smoker -- 1.00 packs/day for 25 years    Types: Cigarettes    Quit date: 01/05/1982  . Smokeless tobacco: Never Used  . Alcohol Use: No  . Drug Use: No  . Sexually Active: Not on file   Other Topics Concern  . 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    Past Surgical History  Procedure Laterality Date  . Eye  surgery  08/2009    cataract removal left eye--Dr Dione Booze  . Melanoma excision      left forearm    Family History  Problem Relation Age of Onset  . Arthritis Mother   . Heart disease Sister     MI  . Heart disease Brother     MI  . Heart disease Brother     MI  . Melanoma Son     Allergies  Allergen Reactions  . Aspirin     REACTION: hx of ulcers  . Dust Mite Extract   . Other     Cockroach, grass and trees    Current Outpatient Prescriptions on File Prior to Visit  Medication Sig Dispense Refill  . aspirin EC 81 MG tablet Take 81 mg by mouth daily.      [name] atorvastatin (LIPITOR) 40 MG tablet TAKE ONE TABLET BY MOUTH ONCE DAILY  30 tablet  2  . diazepam (VALIUM) 10 MG tablet TAKE ONE TABLET BY MOUTH ONCE DAILY AS NEEDED FOR ANXIETY  30 tablet  0  . diltiazem (CARDIZEM CD) 180 MG 24 hr capsule Take 1 capsule (180 mg total) by mouth daily.  30 capsule  3  . fish oil-omega-3 fatty acids 1000 MG capsule Take 2 g by mouth 2 (two) times daily.        . hydrochlorothiazide (HYDRODIURIL) 25 MG tablet Take 1 tablet (25 mg total) by mouth daily.  30 tablet  5  . Hydrocodone-Acetaminophen 7.5-300 MG TABS TAKE ONE TABLET BY MOUTH THREE TIMES DAILY  90 each  0  . losartan (COZAAR) 100 MG tablet TAKE ONE TABLET BY MOUTH EVERY DAY  30 tablet  5  . omeprazole (PRILOSEC) 20 MG capsule Take 1 capsule (20 mg total) by mouth daily.      [name] PARoxetine (PAXIL) 30 MG tablet TAKE ONE TABLET BY MOUTH ONCE DAILY  30 tablet  2  . sildenafil (VIAGRA) 100 MG tablet Take 1 tablet (100 mg total) by mouth daily as needed for erectile dysfunction.  5 tablet  2  . sitaGLIPtin (JANUVIA) 100 MG tablet Take 1 tablet (100 mg total) by mouth daily.  30 tablet  2   No current facility-administered medications on file prior to visit.    BP 118/76  Pulse 59  Temp(Src) 97.7 F (36.5 C) (Oral)  Resp 18  Wt 289 lb (131.09 kg)  BMI 41.47 kg/m2  SpO2 95%       Objective:   Physical Exam  Constitutional: He  is oriented to person, place, and time. He appears well-developed and well-nourished. No distress.  HENT:  Head: Normocephalic and atraumatic.  Cardiovascular: Normal rate and regular rhythm.   No murmur heard. Pulmonary/Chest: Effort normal and breath sounds normal. No respiratory distress. He has no wheezes. He has no rales. He exhibits no tenderness.  Musculoskeletal: He exhibits no edema.  Lymphadenopathy:    He has no cervical adenopathy.  Neurological: He is alert and oriented to person, place, and time.  Psychiatric: He has a normal mood and affect. His behavior is normal. Judgment and thought content normal.  skin: dry eczematous rash noted on left forarm        Assessment & Plan:

## 2012-06-14 NOTE — Assessment & Plan Note (Addendum)
Urine notes mod blood.  Will send urine for culture and plan empiric rx with cipro.  CT abdomen and pelvis notes tiny nonobstructing left intrarenal calculi. No evidence of ureteral calculi, hydronephrosis, or other acute findings.  Suspect pain is musculoskeletal. Continue vicodin PRN.

## 2012-06-14 NOTE — Patient Instructions (Addendum)
Complete CT of your abdomen on the first floor. Paxil- cut in half and take 1/2 tab every other day for 2 week.  Then every other day for 1 week, then stop.   Start cipro. Call if symptoms worsen or if symptoms do not improve. Follow up in 1 week.

## 2012-06-14 NOTE — Assessment & Plan Note (Signed)
Intermittent.  Continue vicodin.  Monitor.

## 2012-06-14 NOTE — Assessment & Plan Note (Signed)
Trial of betamethasone.  

## 2012-06-16 LAB — URINE CULTURE: Organism ID, Bacteria: NO GROWTH

## 2012-06-21 ENCOUNTER — Encounter: Payer: Self-pay | Admitting: Family

## 2012-06-21 ENCOUNTER — Ambulatory Visit (INDEPENDENT_AMBULATORY_CARE_PROVIDER_SITE_OTHER): Payer: Medicare Other | Admitting: Family

## 2012-06-21 VITALS — BP 122/68 | HR 65 | Temp 98.6°F | Wt 289.1 lb

## 2012-06-21 DIAGNOSIS — R109 Unspecified abdominal pain: Secondary | ICD-10-CM

## 2012-06-21 DIAGNOSIS — F341 Dysthymic disorder: Secondary | ICD-10-CM

## 2012-06-21 NOTE — Progress Notes (Signed)
Subjective:    Patient ID: Christian Morris, male    DOB: [birth year], 72 y.o.   MRN: [id number]  HPI  Christian Morris is a 72 yr old male who presents today for follow up of his left flank pain.  CT noted small non-obstructive stones and bilateral L5 pars defects, with severe degenerative disc disease and grade 2 anterolisthesis at L5-S1. The disc disease is not new for him. Reports that his back pain is currently at baseline.    Anxiety/depression- could not tolerate weaning off of the paxil.  Reports that he had tearfulness.  He restarted and is feeling better.   Review of Systems    see HPI  Past Medical History  Diagnosis Date  . Arthritis   . Depression   . Diabetes mellitus   . Hypertension   . Hyperlipidemia   . History of gastric ulcer   . Transfusion history     due to bleeding ulcer  . Hypogonadism male 01/11/2011  . Squamous cell carcinoma in situ of skin of forearm 01/23/2011  . Lung nodule     right 16 mm, seen initially 6/12  . GERD (gastroesophageal reflux disease)   . Headache(784.0)     History   Social History  . Marital Status: Divorced    Spouse Name: N/A    Number of Children: N/A  . Years of Education: N/A   Occupational History  . RETIRED BUS DRIVER    Social History Main Topics  . Smoking status: Former Smoker -- 1.00 packs/day for 25 years    Types: Cigarettes    Quit date: 01/05/1982  . Smokeless tobacco: Never Used  . Alcohol Use: No  . Drug Use: No  . Sexually Active: Not on file   Other Topics Concern  . 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    Past Surgical History  Procedure Laterality Date  . Eye surgery  08/2009    cataract removal left eye--Dr Dione Booze  . Melanoma excision      left forearm    Family History  Problem Relation Age of Onset  . Arthritis Mother   . Heart disease Sister     MI  . Heart disease Brother     MI  . Heart disease  Brother     MI  . Melanoma Son     Allergies  Allergen Reactions  . Aspirin     REACTION: hx of ulcers  . Dust Mite Extract   . Other     Cockroach, grass and trees    Current Outpatient Prescriptions on File Prior to Visit  Medication Sig Dispense Refill  . aspirin EC 81 MG tablet Take 81 mg by mouth daily.      [name] atorvastatin (LIPITOR) 40 MG tablet TAKE ONE TABLET BY MOUTH ONCE DAILY  30 tablet  2  . betamethasone dipropionate (DIPROLENE) 0.05 % cream Apply topically 2 (two) times daily.  30 g  0  . ciprofloxacin (CIPRO) 250 MG tablet Take 1 tablet (250 mg total) by mouth 2 (two) times daily.  14 tablet  0  . diazepam (VALIUM) 10 MG tablet TAKE ONE TABLET BY MOUTH ONCE DAILY AS NEEDED FOR ANXIETY  30 tablet  0  . diltiazem (CARDIZEM CD) 180 MG 24 hr capsule Take 1 capsule (180 mg total) by mouth daily.  30 capsule  3  . fish  oil-omega-3 fatty acids 1000 MG capsule Take 2 g by mouth 2 (two) times daily.        . hydrochlorothiazide (HYDRODIURIL) 25 MG tablet Take 1 tablet (25 mg total) by mouth daily.  30 tablet  5  . Hydrocodone-Acetaminophen 7.5-300 MG TABS TAKE ONE TABLET BY MOUTH THREE TIMES DAILY  90 each  0  . losartan (COZAAR) 100 MG tablet TAKE ONE TABLET BY MOUTH EVERY DAY  30 tablet  5  . omeprazole (PRILOSEC) 20 MG capsule Take 1 capsule (20 mg total) by mouth daily.      [name] PARoxetine (PAXIL) 30 MG tablet TAKE ONE TABLET BY MOUTH ONCE DAILY  30 tablet  2  . sildenafil (VIAGRA) 100 MG tablet Take 1 tablet (100 mg total) by mouth daily as needed for erectile dysfunction.  5 tablet  2  . sitaGLIPtin (JANUVIA) 100 MG tablet Take 1 tablet (100 mg total) by mouth daily.  30 tablet  2   No current facility-administered medications on file prior to visit.    BP 122/68  Pulse 65  Temp(Src) 98.6 F (37 C) (Oral)  Wt 289 lb 1.9 oz (131.144 kg)  BMI 41.48 kg/m2  SpO2 94%    Objective:   Physical Exam  Constitutional: He is oriented to person, place, and time. He appears  well-developed and well-nourished. No distress.  Cardiovascular: Normal rate and regular rhythm.   Pulmonary/Chest: Effort normal and breath sounds normal. No respiratory distress. He has no wheezes. He has no rales. He exhibits no tenderness.  Neurological: He is alert and oriented to person, place, and time.  Psychiatric: He has a normal mood and affect. His behavior is normal. Judgment and thought content normal.          Assessment & Plan:

## 2012-06-21 NOTE — Patient Instructions (Addendum)
Please follow up in 3 months.  

## 2012-06-21 NOTE — Progress Notes (Signed)
  Subjective:    Patient ID: Christian Morris, male    DOB: Oct 11, 1940, 72 y.o.   MRN: [id number]  HPI Mr. Narula is a 72 year old male who presents today to follow up for left sided flank pain. States pain is improved in last 2 days, sleeping better. Chronic back pain is same.    Review of Systems  Constitutional: Negative.   Gastrointestinal: Negative for abdominal pain.  Genitourinary: Negative for dysuria, flank pain and difficulty urinating.  Psychiatric/Behavioral: Negative.        Objective:   Physical Exam        Assessment & Plan:

## 2012-06-22 NOTE — Assessment & Plan Note (Signed)
Unable to tolerate weaning off paxil. Continue paxil.

## 2012-06-22 NOTE — Assessment & Plan Note (Signed)
Improved, likely musculoskeletal in nature.

## 2012-06-27 ENCOUNTER — Telehealth: Payer: Self-pay | Admitting: *Deleted

## 2012-06-27 MED ORDER — HYDROCODONE-ACETAMINOPHEN 7.5-300 MG PO TABS: ORAL_TABLET | ORAL | Status: DC

## 2012-06-27 MED ORDER — DIAZEPAM 10 MG PO TABS: ORAL_TABLET | ORAL | Status: DC

## 2012-06-27 NOTE — Telephone Encounter (Signed)
OK to send refill.  

## 2012-06-27 NOTE — Telephone Encounter (Signed)
Pt called requesting refills of diazepam and hydrocodone. Rx last called in on 05/26/12, #30 diazepam and #90 hydrocodone. Please advise.

## 2012-06-27 NOTE — Telephone Encounter (Signed)
Refills left on pharmacy voicemail. Unable to leave message for pt re: completion.

## 2012-07-14 ENCOUNTER — Other Ambulatory Visit: Payer: Self-pay

## 2012-07-23 ENCOUNTER — Other Ambulatory Visit: Payer: Self-pay | Admitting: Family

## 2012-07-25 ENCOUNTER — Ambulatory Visit (INDEPENDENT_AMBULATORY_CARE_PROVIDER_SITE_OTHER): Payer: Medicare Other | Admitting: Family

## 2012-07-25 ENCOUNTER — Encounter: Payer: Self-pay | Admitting: Family

## 2012-07-25 VITALS — BP 126/78 | HR 53 | Temp 98.4°F | Resp 18 | Ht 70.0 in | Wt 287.1 lb

## 2012-07-25 DIAGNOSIS — R351 Nocturia: Secondary | ICD-10-CM

## 2012-07-25 DIAGNOSIS — I10 Essential (primary) hypertension: Secondary | ICD-10-CM

## 2012-07-25 DIAGNOSIS — E119 Type 2 diabetes mellitus without complications: Secondary | ICD-10-CM

## 2012-07-25 DIAGNOSIS — E785 Hyperlipidemia, unspecified: Secondary | ICD-10-CM

## 2012-07-25 MED ORDER — HYDROCODONE-ACETAMINOPHEN 7.5-300 MG PO TABS: ORAL_TABLET | ORAL | Status: DC

## 2012-07-25 MED ORDER — SITAGLIPTIN PHOSPHATE 100 MG PO TABS: 100.0000 mg | ORAL_TABLET | Freq: Every day | ORAL | Status: DC

## 2012-07-25 MED ORDER — DIAZEPAM 10 MG PO TABS: ORAL_TABLET | ORAL | Status: DC

## 2012-07-25 MED ORDER — TAMSULOSIN HCL 0.4 MG PO CAPS: 0.4000 mg | ORAL_CAPSULE | Freq: Every day | ORAL | Status: DC

## 2012-07-25 NOTE — Telephone Encounter (Signed)
eRx transmission to pharmacy failed. Rx printed and given to pt and appt today.

## 2012-07-25 NOTE — Addendum Note (Signed)
Addended by: O'SULLIVAN, Ivaan Liddy on: 07/25/2012 10:05 AM   Modules accepted: Orders  

## 2012-07-25 NOTE — Addendum Note (Signed)
Addended by: Mervin Kung A on: 07/25/2012 10:50 AM   Modules accepted: Orders

## 2012-07-25 NOTE — Patient Instructions (Addendum)
Please return for lab work after 07/28/12. Continue Januvia. Follow up in 3 months.

## 2012-07-28 NOTE — Assessment & Plan Note (Signed)
LDL is at goal. LFT normal last visit.  Continue statin.

## 2012-07-28 NOTE — Assessment & Plan Note (Signed)
BP Readings from Last 3 Encounters:  07/25/12 126/78  06/21/12 122/68  06/14/12 118/76   BP stable on current meds.

## 2012-07-28 NOTE — Progress Notes (Signed)
Subjective:    Patient ID: Christian Morris, male    DOB: Oct 04, 1940, 72 y.o.   MRN: [id number]  HPI  Christian Morris is a 72 yr old male who presents today for follow up of multiple medical problems.  1) DM2- He is currently maintained on januvia. Reports elevated sugars recently at home as high as 300.    2) HTN- He is currently maintained on diltiazem, losartan.  3) Hyperlipidemia- currently maintained on lipitor.   Review of Systems    see HPI  Past Medical History  Diagnosis Date  . Arthritis   . Depression   . Diabetes mellitus   . Hypertension   . Hyperlipidemia   . History of gastric ulcer   . Transfusion history     due to bleeding ulcer  . Hypogonadism male 01/11/2011  . Squamous cell carcinoma in situ of skin of forearm 01/23/2011  . Lung nodule     right 16 mm, seen initially 6/12  . GERD (gastroesophageal reflux disease)   . Headache(784.0)     History   Social History  . Marital Status: Divorced    Spouse Name: N/A    Number of Children: N/A  . Years of Education: N/A   Occupational History  . RETIRED BUS DRIVER    Social History Main Topics  . Smoking status: Former Smoker -- 1.00 packs/day for 25 years    Types: Cigarettes    Quit date: 01/05/1982  . Smokeless tobacco: Never Used  . Alcohol Use: No  . Drug Use: No  . Sexually Active: Not on file   Other Topics Concern  . 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    Past Surgical History  Procedure Laterality Date  . Eye surgery  08/2009    cataract removal left eye--Dr Dione Booze  . Melanoma excision      left forearm    Family History  Problem Relation Age of Onset  . Arthritis Mother   . Heart disease Sister     MI  . Heart disease Brother     MI  . Heart disease Brother     MI  . Melanoma Son     Allergies  Allergen Reactions  . Aspirin     REACTION: hx of ulcers  . Dust Mite Extract   . Other      Cockroach, grass and trees    Current Outpatient Prescriptions on File Prior to Visit  Medication Sig Dispense Refill  . aspirin EC 81 MG tablet Take 81 mg by mouth daily.      [name] atorvastatin (LIPITOR) 40 MG tablet TAKE ONE TABLET BY MOUTH ONCE DAILY  30 tablet  2  . betamethasone dipropionate (DIPROLENE) 0.05 % cream Apply topically 2 (two) times daily.  30 g  0  . ciprofloxacin (CIPRO) 250 MG tablet Take 1 tablet (250 mg total) by mouth 2 (two) times daily.  14 tablet  0  . diltiazem (CARDIZEM CD) 180 MG 24 hr capsule Take 1 capsule (180 mg total) by mouth daily.  30 capsule  3  . fish oil-omega-3 fatty acids 1000 MG capsule Take 2 g by mouth 2 (two) times daily.        . hydrochlorothiazide (HYDRODIURIL) 25 MG tablet Take 1 tablet (25 mg total) by mouth daily.  30 tablet  5  . losartan (COZAAR) 100 MG tablet TAKE  ONE TABLET BY MOUTH EVERY DAY  30 tablet  5  . omeprazole (PRILOSEC) 20 MG capsule Take 1 capsule (20 mg total) by mouth daily.      [name] PARoxetine (PAXIL) 30 MG tablet TAKE ONE TABLET BY MOUTH ONCE DAILY  30 tablet  2  . sildenafil (VIAGRA) 100 MG tablet Take 1 tablet (100 mg total) by mouth daily as needed for erectile dysfunction.  5 tablet  2  . sitaGLIPtin (JANUVIA) 100 MG tablet Take 1 tablet (100 mg total) by mouth daily.  30 tablet  2   No current facility-administered medications on file prior to visit.    BP 126/78  Pulse 53  Temp(Src) 98.4 F (36.9 C) (Oral)  Resp 18  Ht 5\' 10"  (1.778 m)  Wt 287 lb 1.3 oz (130.219 kg)  BMI 41.19 kg/m2  SpO2 96%    Objective:   Physical Exam  Constitutional: He is oriented to person, place, and time. He appears well-developed and well-nourished. No distress.  HENT:  Head: Normocephalic and atraumatic.  Cardiovascular: Normal rate and regular rhythm.   No murmur heard. Pulmonary/Chest: Effort normal and breath sounds normal. No respiratory distress. He has no wheezes. He has no rales. He exhibits no tenderness.   Musculoskeletal: He exhibits no edema.  Neurological: He is alert and oriented to person, place, and time.  Psychiatric: He has a normal mood and affect. His behavior is normal. Judgment and thought content normal.          Assessment & Plan:

## 2012-07-28 NOTE — Assessment & Plan Note (Signed)
Hyperglycemia at home.  Check A1C.  If elevated consider addition of farxiga.

## 2012-08-02 LAB — HEMOGLOBIN A1C

## 2012-08-02 LAB — BASIC METABOLIC PANEL
BUN: 21 mg/dL (ref 6–23)
Calcium: 9.5 mg/dL (ref 8.4–10.5)
Creat: 1.09 mg/dL (ref 0.50–1.35)

## 2012-08-08 ENCOUNTER — Telehealth: Payer: Self-pay | Admitting: Family

## 2012-08-08 DIAGNOSIS — E119 Type 2 diabetes mellitus without complications: Secondary | ICD-10-CM

## 2012-08-08 NOTE — Telephone Encounter (Signed)
Did not see any comments for this patient's results. Please advise?

## 2012-08-08 NOTE — Telephone Encounter (Signed)
Patient is requesting last lab results 

## 2012-08-09 LAB — HEMOGLOBIN A1C: Mean Plasma Glucose: 226 mg/dL — ABNORMAL HIGH (ref <117)

## 2012-08-09 NOTE — Telephone Encounter (Signed)
Patient notified of results. Patient stated that he would be by lab today for redraw (A1C).

## 2012-08-09 NOTE — Telephone Encounter (Signed)
Patient called back regarding this and would like a callback as soon as possible.

## 2012-08-09 NOTE — Telephone Encounter (Signed)
PSA normal.   Kidney function looks ok but there is some protein in his urine.  Keeping sugars well controlled should help this.  The A1C was not run.  If we are unable to add on, then I would like for him to return to lab for redraw please.  Sodium slightly low.  This is likely related to the hctz that he takes. We will keep an eye on that.

## 2012-08-09 NOTE — Telephone Encounter (Signed)
Pt presented to the lab, order entered.

## 2012-08-09 NOTE — Addendum Note (Signed)
Addended by: Mervin Kung A on: 08/09/2012 11:32 AM   Modules accepted: Orders

## 2012-08-10 ENCOUNTER — Telehealth: Payer: Self-pay | Admitting: Family

## 2012-08-10 MED ORDER — PIOGLITAZONE HCL 30 MG PO TABS: 30.0000 mg | ORAL_TABLET | Freq: Every day | ORAL | Status: DC

## 2012-08-10 NOTE — Telephone Encounter (Signed)
Please call pt and let him know that his diabetes is poorly controlled.  Confirm that he is taking [place] daily.  If so, I would like him to add actos pended below and work hard on diet, exercise, weight loss.

## 2012-08-10 NOTE — Telephone Encounter (Signed)
Notified pt and he voices understanding. Rx already sent.

## 2012-08-19 ENCOUNTER — Telehealth: Payer: Self-pay | Admitting: *Deleted

## 2012-08-19 ENCOUNTER — Ambulatory Visit (INDEPENDENT_AMBULATORY_CARE_PROVIDER_SITE_OTHER): Payer: Medicare Other | Admitting: Family

## 2012-08-19 VITALS — BP 100/70 | HR 61 | Resp 18

## 2012-08-19 DIAGNOSIS — E119 Type 2 diabetes mellitus without complications: Secondary | ICD-10-CM

## 2012-08-19 MED ORDER — INSULIN GLARGINE 100 UNIT/ML SOLOSTAR PEN: 10.0000 [IU] | PEN_INJECTOR | Freq: Every day | SUBCUTANEOUS | Status: DC

## 2012-08-19 MED ORDER — INSULIN PEN NEEDLE 31G X 6 MM MISC: Status: DC

## 2012-08-19 NOTE — Telephone Encounter (Signed)
I think he should be started on lantus 10 units sq qhs.  Please have him schedule a nurse visit for teaching. Rx sent for lantus and pen needles.

## 2012-08-19 NOTE — Telephone Encounter (Signed)
Received call from pt stating his blood sugars continue to be elevated. Recently started Actos in addition to Januvia.  Reports glucose on 08/18/12 was >300 in the am and around 280s in the pm. 08/19/12  311 in the a.m. Pt states his blood sugars have run around 300 every morning and around the 280s every afternoon since he last saw [place].  Please advise.

## 2012-08-19 NOTE — Assessment & Plan Note (Signed)
Start lantus 10 units.  Recommended that pt call if sugars less than 80.   Recommended that he check sugars bid, keep log and bring this to a follow up apt in 6 weeks.  Pt verbalizes understanding.

## 2012-08-19 NOTE — Progress Notes (Signed)
  Subjective:    Patient ID: Christian Morris, male    DOB: [birth year], 72 y.o.   MRN: [id number]  HPI  Christian Morris is a 72 yr old male who presents today for insulin teaching.   CMA provided teaching and reports that pt demonstrated proper technique and insulin administration.    Review of Systems     Objective:   Physical Exam  Constitutional: He is oriented to person, place, and time. He appears well-developed and well-nourished. No distress.  Cardiovascular: Normal rate and regular rhythm.   No murmur heard. Pulmonary/Chest: Effort normal and breath sounds normal. No respiratory distress. He has no wheezes. He has no rales. He exhibits no tenderness.  Musculoskeletal: He exhibits no edema.  Neurological: He is alert and oriented to person, place, and time.  Psychiatric: He has a normal mood and affect. His behavior is normal. Judgment and thought content normal.          Assessment & Plan:

## 2012-08-19 NOTE — Progress Notes (Signed)
Pt here for diabetic instruction on insulin injection.  Instructed pt on proper aseptic and injection technique. Pt voices understanding, demonstrates proper techinque and gave first injection in the office today. Pt was instructed to check FBS every morning and call us on Monday with his readings.

## 2012-08-19 NOTE — Telephone Encounter (Signed)
Notified pt. He states he was just on his way to pick up medication. He will come to the office for teaching this afternoon.

## 2012-08-23 ENCOUNTER — Telehealth: Payer: Self-pay | Admitting: Family

## 2012-08-23 NOTE — Telephone Encounter (Signed)
Please advise 

## 2012-08-23 NOTE — Telephone Encounter (Signed)
Patient left message on nurse voicemail stating that the new medication that [facility] prescribed him is making him very sleepy and he has no energy. He states that he did not take any yesterday and switched back to metformin.

## 2012-08-24 ENCOUNTER — Other Ambulatory Visit: Payer: Self-pay | Admitting: *Deleted

## 2012-08-24 NOTE — Telephone Encounter (Signed)
Pt called stating he will be due for refill of diazepam and lortab on Friday. Last rxs provided on 07/25/12.  Please advise.

## 2012-08-24 NOTE — Telephone Encounter (Signed)
Ok to call in refills as below please.

## 2012-08-24 NOTE — Telephone Encounter (Signed)
Spoke with pt. He states he feels the [place] is what is making him sleepy. He has restarted Metformin 1000mg  twice a day in place of [place].  Pt also has actos 30mg  daily that he takes as well as the lantus. Advised pt of instruction below. Please advise if any change in below instructions.

## 2012-08-24 NOTE — Telephone Encounter (Signed)
OK to stay of lantus for now and continue metformin. Check sugar 1x a day.  Keep log.  Call us in 1 week with these readings for further instructions.

## 2012-08-24 NOTE — Telephone Encounter (Signed)
OK, in that case I recommend for him  to stay off of Januvia and continue metformin and actos. He should take the lantus 10 units.

## 2012-08-25 MED ORDER — DIAZEPAM 10 MG PO TABS: ORAL_TABLET | ORAL | Status: DC

## 2012-08-25 MED ORDER — HYDROCODONE-ACETAMINOPHEN 7.5-300 MG PO TABS: ORAL_TABLET | ORAL | Status: DC

## 2012-08-25 NOTE — Telephone Encounter (Signed)
Notified pt and he voices understanding. 

## 2012-08-25 NOTE — Telephone Encounter (Signed)
Refills called to pharmacy voicemail and pt notified.

## 2012-08-26 ENCOUNTER — Telehealth: Payer: Self-pay | Admitting: Family

## 2012-08-26 NOTE — Telephone Encounter (Signed)
Continue actos, metformin and lantus 10 units for now.  Continue to monitor blood sugars.

## 2012-08-26 NOTE — Telephone Encounter (Signed)
Received order from [facility] for CPAP machine and forwarded it to Provider for completion.

## 2012-08-26 NOTE — Telephone Encounter (Signed)
Notified pt and he voices understanding. 

## 2012-08-26 NOTE — Telephone Encounter (Signed)
Pt called with the following glucometer readings:    AM PM 08/20/12    279 08/21/12 297 218 08/22/12  232 08/23/12 286 231 08/24/12 235 229 08/25/12 219 168 08/26/12 114  Please advise.

## 2012-08-26 NOTE — Telephone Encounter (Signed)
Patient is requesting a new prescription for a CPAP machine. He would like to pick up prescription before 1pm today.

## 2012-09-02 ENCOUNTER — Other Ambulatory Visit: Payer: Self-pay | Admitting: Family

## 2012-09-21 ENCOUNTER — Other Ambulatory Visit: Payer: Self-pay | Admitting: *Deleted

## 2012-09-21 MED ORDER — GLUCOSE BLOOD VI STRP: ORAL_STRIP | Status: DC

## 2012-09-21 NOTE — Telephone Encounter (Signed)
Rx request to pharmacy/SLS  

## 2012-09-21 NOTE — Addendum Note (Signed)
Addended by: Regis Bill on: 09/21/2012 12:03 PM   Modules accepted: Orders

## 2012-09-21 NOTE — Telephone Encounter (Signed)
Rx faxed to pharmacy d/t E-Prescribing Status: Transmission to pharmacy failed (09/21/2012 9:04 AM EDT)/SLS

## 2012-09-26 ENCOUNTER — Other Ambulatory Visit: Payer: Self-pay | Admitting: Family

## 2012-09-26 NOTE — Telephone Encounter (Signed)
OK to phone in meds as pended below please.

## 2012-09-26 NOTE — Telephone Encounter (Signed)
Last filled on 08/24/12 #90 with 0 additional refills Has a future appt on 09/30/12 Last seen on 07/25/12 Please advise. Thanks!

## 2012-09-26 NOTE — Telephone Encounter (Signed)
Patient is requesting a refill of diazepam and the pain pills for his back(not sure of name) to be sent to [facility]

## 2012-09-27 MED ORDER — HYDROCODONE-ACETAMINOPHEN 7.5-300 MG PO TABS: ORAL_TABLET | ORAL | Status: DC

## 2012-09-27 MED ORDER — DIAZEPAM 10 MG PO TABS: ORAL_TABLET | ORAL | Status: DC

## 2012-09-27 NOTE — Telephone Encounter (Signed)
Refills called to pharmacy voicemail.

## 2012-09-30 ENCOUNTER — Ambulatory Visit (INDEPENDENT_AMBULATORY_CARE_PROVIDER_SITE_OTHER): Payer: Medicare Other | Admitting: Family

## 2012-09-30 ENCOUNTER — Encounter: Payer: Self-pay | Admitting: Family

## 2012-09-30 VITALS — BP 104/70 | HR 54 | Temp 98.2°F | Resp 18 | Ht 70.0 in | Wt 297.0 lb

## 2012-09-30 DIAGNOSIS — E785 Hyperlipidemia, unspecified: Secondary | ICD-10-CM

## 2012-09-30 DIAGNOSIS — Z23 Encounter for immunization: Secondary | ICD-10-CM

## 2012-09-30 DIAGNOSIS — I10 Essential (primary) hypertension: Secondary | ICD-10-CM

## 2012-09-30 DIAGNOSIS — E119 Type 2 diabetes mellitus without complications: Secondary | ICD-10-CM

## 2012-09-30 DIAGNOSIS — F341 Dysthymic disorder: Secondary | ICD-10-CM

## 2012-09-30 LAB — BASIC METABOLIC PANEL
CO2: 30 mEq/L (ref 19–32)
Calcium: 10 mg/dL (ref 8.4–10.5)
Creat: 1.18 mg/dL (ref 0.50–1.35)
Sodium: 138 mEq/L (ref 135–145)

## 2012-09-30 LAB — LIPID PANEL
HDL: 37 mg/dL — ABNORMAL LOW (ref >39)
Total CHOL/HDL Ratio: 6.9 Ratio
Triglycerides: 477 mg/dL — ABNORMAL HIGH (ref <150)

## 2012-09-30 LAB — HEPATIC FUNCTION PANEL
AST: 14 U/L (ref 0–37)
Albumin: 4.7 g/dL (ref 3.5–5.2)
Alkaline Phosphatase: 64 U/L (ref 39–117)
Indirect Bilirubin: 0.5 mg/dL (ref 0.0–0.9)
Total Bilirubin: 0.6 mg/dL (ref 0.3–1.2)
Total Protein: 7.4 g/dL (ref 6.0–8.3)

## 2012-09-30 NOTE — Assessment & Plan Note (Signed)
bp stable on current meds. Continue same.  

## 2012-09-30 NOTE — Progress Notes (Signed)
Subjective:    Patient ID: Christian Morris, male    DOB: [birth year], 72 y.o.   MRN: [id number]  HPI  Christian Morris is a 72 yr old male who presents today for follow up of multiple medical problems.  1) Anxiety/Depression- He continues valium and paxil.  He reports good mood and anxiety is well controlled.    2) Diabetes- Pt is currently maintained on lantus 10 units SQ daily.  He brings with him today his blood sugar log which reveals sugars generally in the 200-220 range.  The majority of his fasting sugars are above 200. He is on actos as well.  He is on an ARB baby aspirin.  Eye exam is up to date. Reports + compliance with his insulin.    3) HTN- currently maintained on diltiazem, hctz. Denies CP, SOB or swelling.  He dies report some left lower chest tenderness and occasional reflux.   4) Hyperlipidemia-  Currently on statin.  Last LDL was at goal.  Denies myalgia.    Review of Systems    see HPI  Past Medical History  Diagnosis Date  . Arthritis   . Depression   . Diabetes mellitus   . Hypertension   . Hyperlipidemia   . History of gastric ulcer   . Transfusion history     due to bleeding ulcer  . Hypogonadism male 01/11/2011  . Squamous cell carcinoma in situ of skin of forearm 01/23/2011  . Lung nodule     right 16 mm, seen initially 6/12  . GERD (gastroesophageal reflux disease)   . Headache(784.0)     History   Social History  . Marital Status: Divorced    Spouse Name: N/A    Number of Children: N/A  . Years of Education: N/A   Occupational History  . RETIRED BUS DRIVER    Social History Main Topics  . Smoking status: Former Smoker -- 1.00 packs/day for 25 years    Types: Cigarettes    Quit date: 01/05/1982  . Smokeless tobacco: Never Used  . Alcohol Use: No  . Drug Use: No  . Sexual Activity: Not on file   Other Topics Concern  . 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     Past Surgical History  Procedure Laterality Date  . Eye surgery  08/2009    cataract removal left eye--Dr Dione Booze  . Melanoma excision      left forearm    Family History  Problem Relation Age of Onset  . Arthritis Mother   . Heart disease Sister     MI  . Heart disease Brother     MI  . Heart disease Brother     MI  . Melanoma Son     Allergies  Allergen Reactions  . Aspirin     REACTION: hx of ulcers  . Dust Mite Extract   . Other     Cockroach, grass and trees    Current Outpatient Prescriptions on File Prior to Visit  Medication Sig Dispense Refill  . aspirin EC 81 MG tablet Take 81 mg by mouth daily.      [name] atorvastatin (LIPITOR) 40 MG tablet TAKE ONE TABLET BY MOUTH ONCE DAILY  30 tablet  2  . betamethasone dipropionate (DIPROLENE) 0.05 % cream Apply topically 2 (two) times daily.  30 g  0  . ciprofloxacin (CIPRO) 250 MG  tablet Take 1 tablet (250 mg total) by mouth 2 (two) times daily.  14 tablet  0  . diazepam (VALIUM) 10 MG tablet TAKE ONE TABLET BY MOUTH ONCE DAILY AS NEEDED FOR ANXIETY  30 tablet  0  . diltiazem (CARDIZEM CD) 180 MG 24 hr capsule Take 1 capsule (180 mg total) by mouth daily.  30 capsule  3  . fish oil-omega-3 fatty acids 1000 MG capsule Take 2 g by mouth 2 (two) times daily.        [name] glucose blood test strip Use Freestyle Lite Test Strips As Instructed to Test Blood Glucose Daily. Dx: 250.00  100 each  5  . hydrochlorothiazide (HYDRODIURIL) 25 MG tablet Take 1 tablet (25 mg total) by mouth daily.  30 tablet  5  . Hydrocodone-Acetaminophen 7.5-300 MG TABS TAKE ONE TABLET BY MOUTH THREE TIMES DAILY  90 each  0  . Insulin Glargine (LANTUS SOLOSTAR) 100 UNIT/ML SOPN Inject 10 Units into the skin daily.  5 pen  5  . Insulin Pen Needle 31G X 6 MM MISC Use as directed  100 each  3  . losartan (COZAAR) 100 MG tablet TAKE ONE TABLET BY MOUTH EVERY DAY  30 tablet  5  . metFORMIN (GLUCOPHAGE) 1000 MG tablet Take 1,000 mg by mouth 2 (two) times daily with  a meal.      . omeprazole (PRILOSEC) 20 MG capsule Take 1 capsule (20 mg total) by mouth daily.      [name] PARoxetine (PAXIL) 30 MG tablet TAKE ONE TABLET BY MOUTH ONCE DAILY  30 tablet  2  . pioglitazone (ACTOS) 30 MG tablet Take 1 tablet (30 mg total) by mouth daily.  30 tablet  2  . sildenafil (VIAGRA) 100 MG tablet Take 1 tablet (100 mg total) by mouth daily as needed for erectile dysfunction.  5 tablet  2  . tamsulosin (FLOMAX) 0.4 MG CAPS Take 1 capsule (0.4 mg total) by mouth daily.  30 capsule  3   No current facility-administered medications on file prior to visit.    BP 104/70  Pulse 54  Temp(Src) 98.2 F (36.8 C) (Oral)  Resp 18  Ht 5\' 10"  (1.778 m)  Wt 297 lb (134.718 kg)  BMI 42.61 kg/m2  SpO2 94%    Objective:   Physical Exam  Constitutional: He is oriented to person, place, and time. He appears well-developed and well-nourished. No distress.  Cardiovascular: Normal rate and regular rhythm.   No murmur heard. Pulmonary/Chest: Effort normal.  Faint bibasilar crackles  Neurological: He is alert and oriented to person, place, and time.  Psychiatric: He has a normal mood and affect. His behavior is normal. Judgment and thought content normal.          Assessment & Plan:

## 2012-09-30 NOTE — Patient Instructions (Addendum)
Increase your Lantus form 10 units to 15 units.   Please complete your lab work prior to leaving. Follow up in 2 months.

## 2012-09-30 NOTE — Assessment & Plan Note (Signed)
Stable on paxil and valium, continue same.

## 2012-09-30 NOTE — Assessment & Plan Note (Signed)
Tolerating statin. Continue same, obtain bmet and LFT.

## 2012-09-30 NOTE — Assessment & Plan Note (Signed)
Uncontrolled.  Increase lantus from 10 units to 15 units.

## 2012-10-05 ENCOUNTER — Telehealth: Payer: Self-pay | Admitting: Family

## 2012-10-05 DIAGNOSIS — E785 Hyperlipidemia, unspecified: Secondary | ICD-10-CM

## 2012-10-05 NOTE — Telephone Encounter (Signed)
Cholesterol is very high.  Is he taking lipitor daily?  If not, he should increase lipitor to 80 mg once daily and follow up in 6 weeks for flp/lft dx hyperlipidemia.

## 2012-10-06 NOTE — Telephone Encounter (Signed)
Attempted to reach pt and received message that caller was unavailable. Unable to leave message.

## 2012-10-07 ENCOUNTER — Other Ambulatory Visit: Payer: Self-pay | Admitting: *Deleted

## 2012-10-07 MED ORDER — DILTIAZEM HCL ER COATED BEADS 180 MG PO CP24: 180.0000 mg | ORAL_CAPSULE | Freq: Every day | ORAL | Status: DC

## 2012-10-07 NOTE — Telephone Encounter (Signed)
Rx request to pharmacy/SLS  

## 2012-10-08 ENCOUNTER — Encounter (HOSPITAL_COMMUNITY): Payer: Self-pay | Admitting: *Deleted

## 2012-10-08 ENCOUNTER — Emergency Department (HOSPITAL_COMMUNITY): Payer: No Typology Code available for payment source

## 2012-10-08 ENCOUNTER — Emergency Department (HOSPITAL_COMMUNITY)
Admission: EM | Admit: 2012-10-08 | Discharge: 2012-10-08 | Disposition: A | Discharge status: Home / Self Care | Payer: No Typology Code available for payment source | Attending: Emergency Medicine | Admitting: Emergency Medicine

## 2012-10-08 DIAGNOSIS — Y9389 Activity, other specified: Secondary | ICD-10-CM | POA: Insufficient documentation

## 2012-10-08 DIAGNOSIS — Z85820 Personal history of malignant melanoma of skin: Secondary | ICD-10-CM | POA: Insufficient documentation

## 2012-10-08 DIAGNOSIS — M129 Arthropathy, unspecified: Secondary | ICD-10-CM | POA: Insufficient documentation

## 2012-10-08 DIAGNOSIS — Y92410 Unspecified street and highway as the place of occurrence of the external cause: Secondary | ICD-10-CM | POA: Insufficient documentation

## 2012-10-08 DIAGNOSIS — IMO0002 Reserved for concepts with insufficient information to code with codable children: Secondary | ICD-10-CM | POA: Insufficient documentation

## 2012-10-08 DIAGNOSIS — E785 Hyperlipidemia, unspecified: Secondary | ICD-10-CM | POA: Insufficient documentation

## 2012-10-08 DIAGNOSIS — E119 Type 2 diabetes mellitus without complications: Secondary | ICD-10-CM | POA: Insufficient documentation

## 2012-10-08 DIAGNOSIS — Z8711 Personal history of peptic ulcer disease: Secondary | ICD-10-CM | POA: Insufficient documentation

## 2012-10-08 DIAGNOSIS — S0993XA Unspecified injury of face, initial encounter: Secondary | ICD-10-CM | POA: Insufficient documentation

## 2012-10-08 DIAGNOSIS — Z9189 Other specified personal risk factors, not elsewhere classified: Secondary | ICD-10-CM | POA: Insufficient documentation

## 2012-10-08 DIAGNOSIS — F3289 Other specified depressive episodes: Secondary | ICD-10-CM | POA: Insufficient documentation

## 2012-10-08 DIAGNOSIS — F329 Major depressive disorder, single episode, unspecified: Secondary | ICD-10-CM | POA: Insufficient documentation

## 2012-10-08 DIAGNOSIS — Z794 Long term (current) use of insulin: Secondary | ICD-10-CM | POA: Insufficient documentation

## 2012-10-08 DIAGNOSIS — Z8709 Personal history of other diseases of the respiratory system: Secondary | ICD-10-CM | POA: Insufficient documentation

## 2012-10-08 DIAGNOSIS — I10 Essential (primary) hypertension: Secondary | ICD-10-CM | POA: Insufficient documentation

## 2012-10-08 DIAGNOSIS — R0602 Shortness of breath: Secondary | ICD-10-CM | POA: Insufficient documentation

## 2012-10-08 DIAGNOSIS — K219 Gastro-esophageal reflux disease without esophagitis: Secondary | ICD-10-CM | POA: Insufficient documentation

## 2012-10-08 DIAGNOSIS — T148XXA Other injury of unspecified body region, initial encounter: Secondary | ICD-10-CM

## 2012-10-08 DIAGNOSIS — Z7982 Long term (current) use of aspirin: Secondary | ICD-10-CM | POA: Insufficient documentation

## 2012-10-08 DIAGNOSIS — M549 Dorsalgia, unspecified: Secondary | ICD-10-CM

## 2012-10-08 DIAGNOSIS — Z79899 Other long term (current) drug therapy: Secondary | ICD-10-CM | POA: Insufficient documentation

## 2012-10-08 DIAGNOSIS — Z87891 Personal history of nicotine dependence: Secondary | ICD-10-CM | POA: Insufficient documentation

## 2012-10-08 MED ORDER — CYCLOBENZAPRINE HCL 10 MG PO TABS: 5.0000 mg | ORAL_TABLET | Freq: Three times a day (TID) | ORAL | Status: DC | PRN

## 2012-10-08 MED ORDER — HYDROCODONE-ACETAMINOPHEN 5-325 MG PO TABS: 1.0000 | ORAL_TABLET | ORAL | Status: DC | PRN
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
## 2012-10-08 NOTE — ED Notes (Addendum)
Waiting for EDP to see patient before discharging out. Patient will be discharged with pain medication prescriptions due to patient will be driving himself home.
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
## 2012-10-10 MED ORDER — ATORVASTATIN CALCIUM 40 MG PO TABS: 40.0000 mg | ORAL_TABLET | Freq: Every day | ORAL | Status: DC

## 2012-10-10 NOTE — Telephone Encounter (Signed)
Rx sent, lab orders entered.

## 2012-10-10 NOTE — Telephone Encounter (Signed)
Yes please

## 2012-10-10 NOTE — Telephone Encounter (Signed)
Notified pt. He is not sure if he has been taking this every day. Pt unable to find bottle on hand. Advised pt we will send Rx and he should return to the lab for fasting lipids/lfts around 11/21/12.  Should I refill his current dose of 40mg ?

## 2012-10-14 ENCOUNTER — Emergency Department (HOSPITAL_COMMUNITY): Payer: No Typology Code available for payment source

## 2012-10-14 ENCOUNTER — Other Ambulatory Visit: Payer: Self-pay

## 2012-10-14 ENCOUNTER — Observation Stay (HOSPITAL_COMMUNITY)
Admission: EM | Admit: 2012-10-14 | Discharge: 2012-10-15 | Disposition: A | Discharge status: Home / Self Care | Payer: No Typology Code available for payment source | Attending: Internal Medicine | Admitting: Internal Medicine

## 2012-10-14 ENCOUNTER — Encounter (HOSPITAL_COMMUNITY): Payer: Self-pay | Admitting: Emergency Medicine

## 2012-10-14 ENCOUNTER — Telehealth: Payer: Self-pay | Admitting: Family

## 2012-10-14 DIAGNOSIS — D0460 Carcinoma in situ of skin of unspecified upper limb, including shoulder: Secondary | ICD-10-CM | POA: Insufficient documentation

## 2012-10-14 DIAGNOSIS — E119 Type 2 diabetes mellitus without complications: Secondary | ICD-10-CM | POA: Diagnosis present

## 2012-10-14 DIAGNOSIS — Z888 Allergy status to other drugs, medicaments and biological substances status: Secondary | ICD-10-CM | POA: Insufficient documentation

## 2012-10-14 DIAGNOSIS — R809 Proteinuria, unspecified: Secondary | ICD-10-CM

## 2012-10-14 DIAGNOSIS — Z87891 Personal history of nicotine dependence: Secondary | ICD-10-CM | POA: Insufficient documentation

## 2012-10-14 DIAGNOSIS — K219 Gastro-esophageal reflux disease without esophagitis: Secondary | ICD-10-CM | POA: Diagnosis present

## 2012-10-14 DIAGNOSIS — R52 Pain, unspecified: Secondary | ICD-10-CM | POA: Insufficient documentation

## 2012-10-14 DIAGNOSIS — J4489 Other specified chronic obstructive pulmonary disease: Secondary | ICD-10-CM

## 2012-10-14 DIAGNOSIS — Z79899 Other long term (current) drug therapy: Secondary | ICD-10-CM | POA: Insufficient documentation

## 2012-10-14 DIAGNOSIS — Z8711 Personal history of peptic ulcer disease: Secondary | ICD-10-CM

## 2012-10-14 DIAGNOSIS — E785 Hyperlipidemia, unspecified: Secondary | ICD-10-CM

## 2012-10-14 DIAGNOSIS — N289 Disorder of kidney and ureter, unspecified: Secondary | ICD-10-CM

## 2012-10-14 DIAGNOSIS — Z860100 Personal history of colon polyps, unspecified: Secondary | ICD-10-CM

## 2012-10-14 DIAGNOSIS — G4733 Obstructive sleep apnea (adult) (pediatric): Secondary | ICD-10-CM

## 2012-10-14 DIAGNOSIS — IMO0002 Reserved for concepts with insufficient information to code with codable children: Secondary | ICD-10-CM

## 2012-10-14 DIAGNOSIS — Z7982 Long term (current) use of aspirin: Secondary | ICD-10-CM | POA: Insufficient documentation

## 2012-10-14 DIAGNOSIS — M549 Dorsalgia, unspecified: Secondary | ICD-10-CM | POA: Insufficient documentation

## 2012-10-14 DIAGNOSIS — M1710 Unilateral primary osteoarthritis, unspecified knee: Secondary | ICD-10-CM

## 2012-10-14 DIAGNOSIS — Z9109 Other allergy status, other than to drugs and biological substances: Secondary | ICD-10-CM

## 2012-10-14 DIAGNOSIS — N529 Male erectile dysfunction, unspecified: Secondary | ICD-10-CM

## 2012-10-14 DIAGNOSIS — I10 Essential (primary) hypertension: Secondary | ICD-10-CM | POA: Diagnosis present

## 2012-10-14 DIAGNOSIS — F329 Major depressive disorder, single episode, unspecified: Secondary | ICD-10-CM | POA: Insufficient documentation

## 2012-10-14 DIAGNOSIS — J449 Chronic obstructive pulmonary disease, unspecified: Secondary | ICD-10-CM | POA: Diagnosis present

## 2012-10-14 DIAGNOSIS — E291 Testicular hypofunction: Secondary | ICD-10-CM

## 2012-10-14 DIAGNOSIS — F3289 Other specified depressive episodes: Secondary | ICD-10-CM | POA: Insufficient documentation

## 2012-10-14 DIAGNOSIS — F419 Anxiety disorder, unspecified: Secondary | ICD-10-CM

## 2012-10-14 DIAGNOSIS — F341 Dysthymic disorder: Secondary | ICD-10-CM

## 2012-10-14 DIAGNOSIS — K7689 Other specified diseases of liver: Secondary | ICD-10-CM

## 2012-10-14 DIAGNOSIS — M545 Low back pain, unspecified: Secondary | ICD-10-CM

## 2012-10-14 DIAGNOSIS — M722 Plantar fascial fibromatosis: Secondary | ICD-10-CM

## 2012-10-14 DIAGNOSIS — R911 Solitary pulmonary nodule: Secondary | ICD-10-CM | POA: Insufficient documentation

## 2012-10-14 DIAGNOSIS — R079 Chest pain, unspecified: Principal | ICD-10-CM

## 2012-10-14 DIAGNOSIS — M129 Arthropathy, unspecified: Secondary | ICD-10-CM | POA: Insufficient documentation

## 2012-10-14 DIAGNOSIS — B353 Tinea pedis: Secondary | ICD-10-CM

## 2012-10-14 HISTORY — DX: Fatty (change of) liver, not elsewhere classified: K76.0

## 2012-10-14 LAB — COMPREHENSIVE METABOLIC PANEL
AST: 19 U/L (ref 0–37)
Albumin: 3.9 g/dL (ref 3.5–5.2)
Alkaline Phosphatase: 73 U/L (ref 39–117)
BUN: 27 mg/dL — ABNORMAL HIGH (ref 6–23)
CO2: 22 mEq/L (ref 19–32)
Calcium: 9.5 mg/dL (ref 8.4–10.5)
Chloride: 100 mEq/L (ref 96–112)
GFR calc Af Amer: >90 mL/min (ref >90)
GFR calc non Af Amer: 80 mL/min — ABNORMAL LOW (ref >90)
Glucose, Bld: 156 mg/dL — ABNORMAL HIGH (ref 70–99)
Potassium: 4.3 mEq/L (ref 3.5–5.1)
Total Bilirubin: 0.3 mg/dL (ref 0.3–1.2)

## 2012-10-14 LAB — URINALYSIS, ROUTINE W REFLEX MICROSCOPIC
Glucose, UA: 100 mg/dL — AB
Hgb urine dipstick: NEGATIVE
Ketones, ur: NEGATIVE mg/dL
Nitrite: NEGATIVE
Protein, ur: NEGATIVE mg/dL

## 2012-10-14 LAB — CBC WITH DIFFERENTIAL/PLATELET
Basophils Relative: 0 % (ref 0–1)
Eosinophils Relative: 1 % (ref 0–5)
Hemoglobin: 12.3 g/dL — ABNORMAL LOW (ref 13.0–17.0)
Lymphocytes Relative: 28 % (ref 12–46)
Lymphs Abs: 2.8 10*3/uL (ref 0.7–4.0)
MCHC: 32.8 g/dL (ref 30.0–36.0)
Monocytes Relative: 8 % (ref 3–12)
Neutro Abs: 6.3 10*3/uL (ref 1.7–7.7)
Neutrophils Relative %: 63 % (ref 43–77)
RBC: 4.33 MIL/uL (ref 4.22–5.81)
WBC: 10 10*3/uL (ref 4.0–10.5)

## 2012-10-14 LAB — GLUCOSE, CAPILLARY: Glucose-Capillary: 137 mg/dL — ABNORMAL HIGH (ref 70–99)

## 2012-10-14 LAB — PRO B NATRIURETIC PEPTIDE: Pro B Natriuretic peptide (BNP): 8.2 pg/mL (ref 0–125)

## 2012-10-14 LAB — HEMOGLOBIN A1C
Hgb A1c MFr Bld: 9 % — ABNORMAL HIGH (ref <5.7)
Mean Plasma Glucose: 212 mg/dL — ABNORMAL HIGH (ref <117)

## 2012-10-14 MED ORDER — INSULIN ASPART 100 UNIT/ML ~~LOC~~ SOLN: 0.0000 [IU] | Freq: Every day | SUBCUTANEOUS | Status: DC

## 2012-10-14 MED ORDER — SODIUM CHLORIDE 0.9 % IV SOLN
INTRAVENOUS | Status: DC
  Administered 2012-10-14: 23:00:00 via INTRAVENOUS

## 2012-10-14 MED ORDER — CYCLOBENZAPRINE HCL 5 MG PO TABS
5.0000 mg | ORAL_TABLET | Freq: Three times a day (TID) | ORAL | Status: DC | PRN
  Filled 2012-10-14: qty 1

## 2012-10-14 MED ORDER — MORPHINE SULFATE 2 MG/ML IJ SOLN
2.0000 mg | Freq: Once | INTRAMUSCULAR | Status: AC
  Administered 2012-10-14: 2 mg via INTRAVENOUS
  Filled 2012-10-14: qty 1

## 2012-10-14 MED ORDER — ASPIRIN EC 81 MG PO TBEC
81.0000 mg | DELAYED_RELEASE_TABLET | Freq: Every day | ORAL | Status: DC
  Filled 2012-10-14: qty 1

## 2012-10-14 MED ORDER — INSULIN ASPART 100 UNIT/ML ~~LOC~~ SOLN
0.0000 [IU] | Freq: Three times a day (TID) | SUBCUTANEOUS | Status: DC
  Administered 2012-10-15: 4 [IU] via SUBCUTANEOUS

## 2012-10-14 MED ORDER — ONDANSETRON HCL 4 MG/2ML IJ SOLN: 4.0000 mg | Freq: Four times a day (QID) | INTRAMUSCULAR | Status: DC | PRN

## 2012-10-14 MED ORDER — ATORVASTATIN CALCIUM 40 MG PO TABS
40.0000 mg | ORAL_TABLET | Freq: Every day | ORAL | Status: DC
  Filled 2012-10-14: qty 1

## 2012-10-14 MED ORDER — DIAZEPAM 5 MG PO TABS
10.0000 mg | ORAL_TABLET | Freq: Every evening | ORAL | Status: DC | PRN
Start: 2012-10-14 — End: 2012-10-15
  Administered 2012-10-15: 10 mg via ORAL
  Filled 2012-10-14: qty 2

## 2012-10-14 MED ORDER — PAROXETINE HCL 30 MG PO TABS
30.0000 mg | ORAL_TABLET | Freq: Every day | ORAL | Status: DC
  Filled 2012-10-14: qty 1

## 2012-10-14 MED ORDER — TAMSULOSIN HCL 0.4 MG PO CAPS
0.4000 mg | ORAL_CAPSULE | Freq: Every day | ORAL | Status: DC
  Filled 2012-10-14 (×2): qty 1

## 2012-10-14 MED ORDER — SODIUM CHLORIDE 0.9 % IV BOLUS (SEPSIS)
250.0000 mL | Freq: Once | INTRAVENOUS | Status: AC
  Administered 2012-10-14: 250 mL via INTRAVENOUS

## 2012-10-14 MED ORDER — NITROGLYCERIN 0.4 MG SL SUBL
0.4000 mg | SUBLINGUAL_TABLET | SUBLINGUAL | Status: DC | PRN
  Administered 2012-10-14: 0.4 mg via SUBLINGUAL
  Filled 2012-10-14: qty 25

## 2012-10-14 MED ORDER — INSULIN GLARGINE 100 UNIT/ML ~~LOC~~ SOLN
15.0000 [IU] | Freq: Every day | SUBCUTANEOUS | Status: DC
Start: 2012-10-14 — End: 2012-10-15
  Administered 2012-10-14: 15 [IU] via SUBCUTANEOUS
  Filled 2012-10-14 (×2): qty 0.15

## 2012-10-14 MED ORDER — PIOGLITAZONE HCL 30 MG PO TABS
30.0000 mg | ORAL_TABLET | Freq: Every day | ORAL | Status: DC
  Filled 2012-10-14: qty 1

## 2012-10-14 MED ORDER — LOSARTAN POTASSIUM 50 MG PO TABS
100.0000 mg | ORAL_TABLET | Freq: Every day | ORAL | Status: DC
  Filled 2012-10-14: qty 2

## 2012-10-14 MED ORDER — PANTOPRAZOLE SODIUM 40 MG PO TBEC: 40.0000 mg | DELAYED_RELEASE_TABLET | Freq: Every day | ORAL | Status: DC

## 2012-10-14 MED ORDER — HEPARIN SODIUM (PORCINE) 5000 UNIT/ML IJ SOLN
5000.0000 [IU] | Freq: Three times a day (TID) | INTRAMUSCULAR | Status: DC
  Administered 2012-10-14 – 2012-10-15 (×2): 5000 [IU] via SUBCUTANEOUS
  Filled 2012-10-14 (×6): qty 1

## 2012-10-14 MED ORDER — METFORMIN HCL 500 MG PO TABS
1000.0000 mg | ORAL_TABLET | Freq: Two times a day (BID) | ORAL | Status: DC
  Administered 2012-10-14 – 2012-10-15 (×2): 1000 mg via ORAL
  Filled 2012-10-14 (×4): qty 2

## 2012-10-14 MED ORDER — ONDANSETRON HCL 4 MG/2ML IJ SOLN
4.0000 mg | Freq: Once | INTRAMUSCULAR | Status: AC
  Administered 2012-10-14: 4 mg via INTRAVENOUS
  Filled 2012-10-14: qty 2

## 2012-10-14 MED ORDER — ACETAMINOPHEN 325 MG PO TABS: 650.0000 mg | ORAL_TABLET | ORAL | Status: DC | PRN

## 2012-10-14 MED ORDER — DILTIAZEM HCL ER COATED BEADS 180 MG PO CP24
180.0000 mg | ORAL_CAPSULE | Freq: Every day | ORAL | Status: DC
  Filled 2012-10-14: qty 1

## 2012-10-14 MED ORDER — MORPHINE SULFATE 2 MG/ML IJ SOLN
2.0000 mg | INTRAMUSCULAR | Status: DC | PRN
  Administered 2012-10-14: 2 mg via INTRAVENOUS
  Filled 2012-10-14: qty 1

## 2012-10-14 MED ORDER — HYDROCHLOROTHIAZIDE 25 MG PO TABS
25.0000 mg | ORAL_TABLET | Freq: Every day | ORAL | Status: DC
  Filled 2012-10-14 (×2): qty 1
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
## 2012-10-14 NOTE — ED Provider Notes (Addendum)
CSN: [id number]     Arrival date & time 10/14/12  1144 History   First MD Initiated Contact with Patient 10/14/12 1201     Chief Complaint  Patient presents with  . Chest Pain   (Consider location/radiation/quality/duration/timing/severity/associated sxs/prior Treatment) The history is provided by the patient.   72 year old male history of diabetes hypertension hyperlipidemia. With acute onset of chest pain this morning. Chest pain is left and right nonradiating not associated with shortness of breath no nausea no diaphoresis. Patient is allergic to aspirin so did not take any aspirin. Pain is been constant does wax and wane some but has been constant. Rated an 8/10. Nonradiating not made worse or better by anything.  Past Medical History  Diagnosis Date  . Arthritis   . Depression   . Diabetes mellitus   . Hypertension   . Hyperlipidemia   . History of gastric ulcer   . Transfusion history     due to bleeding ulcer  . Hypogonadism male 01/11/2011  . Squamous cell carcinoma in situ of skin of forearm 01/23/2011  . Lung nodule     right 16 mm, seen initially 6/12  . GERD (gastroesophageal reflux disease)   . YQMVHQIO(962.9)    Past Surgical History  Procedure Laterality Date  . Eye surgery  08/2009    cataract removal left eye--Dr Dione Booze  . Melanoma excision      left forearm   Family History  Problem Relation Age of Onset  . Arthritis Mother   . Heart disease Sister     MI  . Heart disease Brother     MI  . Heart disease Brother     MI  . Melanoma Son    History  Substance Use Topics  . Smoking status: Former Smoker -- 1.00 packs/day for 25 years    Types: Cigarettes    Quit date: 01/05/1982  . Smokeless tobacco: Never Used  . Alcohol Use: No    Review of Systems  Constitutional: Negative for fever and diaphoresis.  HENT: Negative for congestion.   Eyes: Negative for redness.  Respiratory: Negative for cough and shortness of breath.   Cardiovascular: Positive  for chest pain.  Gastrointestinal: Negative for nausea, vomiting and abdominal pain.  Genitourinary: Negative for dysuria.  Musculoskeletal: Positive for back pain.  Skin: Negative for rash.  Allergic/Immunologic: Positive for immunocompromised state.  Neurological: Negative for headaches.  Hematological: Does not bruise/bleed easily.  Psychiatric/Behavioral: Negative for confusion.    Allergies  Aspirin; Dust mite extract; and Other  Home Medications   Current Outpatient Rx  Name  Route  Sig  Dispense  Refill  . aspirin EC 81 MG tablet   Oral   Take 81 mg by mouth daily.         [name] atorvastatin (LIPITOR) 40 MG tablet   Oral   Take 1 tablet (40 mg total) by mouth daily.   30 tablet   3   . cyclobenzaprine (FLEXERIL) 10 MG tablet   Oral   Take 0.5 tablets (5 mg total) by mouth 3 (three) times daily as needed for muscle spasms.   15 tablet   0   . diazepam (VALIUM) 10 MG tablet   Oral   Take 10 mg by mouth at bedtime as needed for anxiety.         [name] diltiazem (CARDIZEM CD) 180 MG 24 hr capsule   Oral   Take 1 capsule (180 mg total) by mouth daily.   30 capsule  3   . fish oil-omega-3 fatty acids 1000 MG capsule   Oral   Take 2 g by mouth 2 (two) times daily.           . hydrochlorothiazide (HYDRODIURIL) 25 MG tablet   Oral   Take 1 tablet (25 mg total) by mouth daily.   30 tablet   5   . HYDROcodone-acetaminophen (NORCO) 7.5-325 MG per tablet   Oral   Take 1 tablet by mouth every 6 (six) hours as needed for pain.         [name] insulin glargine (LANTUS) 100 units/mL SOLN   Subcutaneous   Inject 15 Units into the skin at bedtime.         [name] losartan (COZAAR) 100 MG tablet   Oral   Take 100 mg by mouth daily.         . metFORMIN (GLUCOPHAGE) 1000 MG tablet   Oral   Take 1,000 mg by mouth 2 (two) times daily with a meal.         . omeprazole (PRILOSEC) 20 MG capsule   Oral   Take 1 capsule (20 mg total) by mouth daily.         [name]  PARoxetine (PAXIL) 30 MG tablet   Oral   Take 30 mg by mouth every morning.         . pioglitazone (ACTOS) 30 MG tablet   Oral   Take 1 tablet (30 mg total) by mouth daily.   30 tablet   2   . sildenafil (VIAGRA) 100 MG tablet   Oral   Take 1 tablet (100 mg total) by mouth daily as needed for erectile dysfunction.   5 tablet   2   . tamsulosin (FLOMAX) 0.4 MG CAPS   Oral   Take 1 capsule (0.4 mg total) by mouth daily.   30 capsule   3    BP 121/108  Pulse 66  Temp(Src) 97.7 F (36.5 C) (Oral)  Resp 18  Ht 6\' 1"  (1.854 m)  Wt 296 lb (134.265 kg)  BMI 39.06 kg/m2  SpO2 96% Physical Exam  Nursing note and vitals reviewed. Constitutional: He is oriented to person, place, and time. He appears well-developed and well-nourished. No distress.  HENT:  Head: Normocephalic and atraumatic.  Eyes: Conjunctivae and EOM are normal. Pupils are equal, round, and reactive to light.  Cardiovascular: Normal rate, regular rhythm and normal heart sounds.   No murmur heard. Pulmonary/Chest: Effort normal and breath sounds normal. No respiratory distress. He has no wheezes. He has no rales. He exhibits no tenderness.  Abdominal: Soft. Bowel sounds are normal. There is no tenderness.  Musculoskeletal: Normal range of motion. He exhibits no edema.  Neurological: He is alert and oriented to person, place, and time. No cranial nerve deficit. He exhibits normal muscle tone. Coordination normal.  Skin: Skin is warm. No rash noted.    ED Course  Procedures (including critical care time) Labs Review Labs Reviewed  CBC WITH DIFFERENTIAL - Abnormal; Notable for the following:    Hemoglobin 12.3 (*)    HCT 37.5 (*)    All other components within normal limits  COMPREHENSIVE METABOLIC PANEL - Abnormal; Notable for the following:    Glucose, Bld 156 (*)    BUN 27 (*)    GFR calc non Af Amer 80 (*)    All other components within normal limits  PRO B NATRIURETIC PEPTIDE  URINALYSIS, ROUTINE W  REFLEX MICROSCOPIC  POCT  I-STAT TROPONIN I   Results for orders placed during the hospital encounter of 10/14/12  PRO B NATRIURETIC PEPTIDE      Result Value Range   Pro B Natriuretic peptide (BNP) 8.2  0 - 125 pg/mL  CBC WITH DIFFERENTIAL      Result Value Range   WBC 10.0  4.0 - 10.5 K/uL   RBC 4.33  4.22 - 5.81 MIL/uL   Hemoglobin 12.3 (*) 13.0 - 17.0 g/dL   HCT 16.1 (*) 09.6 - 04.5 %   MCV 86.6  78.0 - 100.0 fL   MCH 28.4  26.0 - 34.0 pg   MCHC 32.8  30.0 - 36.0 g/dL   RDW 40.9  81.1 - 91.4 %   Platelets 228  150 - 400 K/uL   Neutrophils Relative % 63  43 - 77 %   Neutro Abs 6.3  1.7 - 7.7 K/uL   Lymphocytes Relative 28  12 - 46 %   Lymphs Abs 2.8  0.7 - 4.0 K/uL   Monocytes Relative 8  3 - 12 %   Monocytes Absolute 0.8  0.1 - 1.0 K/uL   Eosinophils Relative 1  0 - 5 %   Eosinophils Absolute 0.1  0.0 - 0.7 K/uL   Basophils Relative 0  0 - 1 %   Basophils Absolute 0.0  0.0 - 0.1 K/uL  COMPREHENSIVE METABOLIC PANEL      Result Value Range   Sodium 137  135 - 145 mEq/L   Potassium 4.3  3.5 - 5.1 mEq/L   Chloride 100  96 - 112 mEq/L   CO2 22  19 - 32 mEq/L   Glucose, Bld 156 (*) 70 - 99 mg/dL   BUN 27 (*) 6 - 23 mg/dL   Creatinine, Ser 7.82  0.50 - 1.35 mg/dL   Calcium 9.5  8.4 - 95.6 mg/dL   Total Protein 7.4  6.0 - 8.3 g/dL   Albumin 3.9  3.5 - 5.2 g/dL   AST 19  0 - 37 U/L   ALT 16  0 - 53 U/L   Alkaline Phosphatase 73  39 - 117 U/L   Total Bilirubin 0.3  0.3 - 1.2 mg/dL   GFR calc non Af Amer 80 (*) >90 mL/min   GFR calc Af Amer >90  >90 mL/min  POCT I-STAT TROPONIN I      Result Value Range   Troponin i, poc 0.00  0.00 - 0.08 ng/mL   Comment 3             Imaging Review Dg Chest Port 1 View  10/14/2012   CLINICAL DATA:  Chest pain.  EXAM: PORTABLE CHEST - 1 VIEW  COMPARISON:  Multiple priors  FINDINGS: There are low lung volumes. Pulmonary edema pattern. Left basilar opacity. No pleural effusion or pneumothorax. Cardiac silhouette is enlarged. No acute  osseous abnormality.  IMPRESSION: 1. Pulmonary edema with cardiomegaly.  2. Left basilar opacity, likely atelectasis.   Electronically Signed   By: Jerene Dilling M.D.   On: 10/14/2012 13:24    EKG Interpretation   None      Date: 10/14/2012  Rate: 65  Rhythm: normal sinus rhythm  QRS Axis: left  Intervals: normal  ST/T Wave abnormalities: nonspecific ST changes  Conduction Disutrbances:left anterior fascicular block  Narrative Interpretation:   Old EKG Reviewed: none available    MDM   1. Chest pain    Patient's cardiac workup without the acute findings. however patient with significant  risk factors to include diabetes and hyperlipidemia and hypertension. Patient does not have a known cardiac history. Patient's chest pain significantly improved with sublingual nitroglycerin. Allergic to aspirin so cannot be given aspirin. Patient will be given some morphine to help keep the pain away. First troponin is normal. EKG without acute changes. Patient does have a left anterior fascicular block not know if known if. Chest x-ray raises concern for pulmonary edema pattern the patient's BNP is only 8. Patient without significant shortness of breath saturation here is 96%. Patient with no past history of CHF. Patient is followed by [facility] will consult the hospitalist for admission.    Shelda Jakes, MD 10/14/12 1440  Shelda Jakes, MD 10/14/12 [phone]

## 2012-10-14 NOTE — ED Notes (Signed)
New CP starting this am. Bilateral frontal shoulders. Pressure 8/10. PT states that he has not taken any medications for pain. PT endorses new difficulty taking deep breath. NO dyspnea at triage. PT able to ambulate short distance at triage
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
## 2012-10-14 NOTE — H&P (Addendum)
[facility] History and Physical  Christian Morris WUJ:[id number] DOB: 11/11/1940 DOA: 10/14/2012  Referring physician: Vanetta Mulders, ER physician PCP: Lemont Fillers., NP  Specialists: [facility]  Chief Complaint: Chest pain  HPI: Christian Morris is a 72 y.o. male  With past oral history diabetes mellitus, hypertension and hyperlipidemia who was in usual state of health when he started developing today bilateral sudden onset chest heaviness. He said it is a for someone was sitting on him. He came into the emergency room and lab work initially was unimpressive. EKG looked to be unchanged. With IV morphine, pain resolved. Pain was located across his entire anterior chest. No radiation to the back. No reason to jaw. No associated shortness of breath. Oxygen saturations were normal. Patient is a previous heart history that he is aware of. Echocardiogram done in December of last year was unremarkable. Lab work was normal. Given extensive risk factors, the best for patient to be further evaluated. Interestingly, chest x-ray noted effusions, but BNP was only 8. Hospitalists were called for further evaluation.  Given no complaint of shortness of breath, normal respiratory rate and oxygenation, d-dimer not done.  Review of Systems: Patient seen down emergency room. He denies any headaches, vision changes, dysphasia. His chest pain is gone now. He denies any shortness of breath or wheezing or cough. As the abdominal pain or hematuria or dysuria constipation or diarrhea. He complains of some chronic) back pain in part from the uncomfortable bed. Denies any hematuria, dysuria, constipation or diarrhea, focal extremity numbness or weakness or pain. His chronic lower extremity numbness and from neuropathy. As a has some bilateral heel any pain chronically. Review of systems otherwise negative  Past Medical History  Diagnosis Date  . Arthritis   . Depression   . Diabetes mellitus    . Hypertension   . Hyperlipidemia   . History of gastric ulcer   . Transfusion history     due to bleeding ulcer  . Hypogonadism male 01/11/2011  . Squamous cell carcinoma in situ of skin of forearm 01/23/2011  . Lung nodule     right 16 mm, seen initially 6/12  . GERD (gastroesophageal reflux disease)   . NFAOZHYQ(657.8)    Past Surgical History  Procedure Laterality Date  . Eye surgery  08/2009    cataract removal left eye--Dr Dione Booze  . Melanoma excision      left forearm   Social History:  reports that he quit smoking about 30 years ago. His smoking use included Cigarettes. He has a 25 pack-year smoking history. He has never used smokeless tobacco. He reports that he does not drink alcohol or use illicit drugs. Patient was at home with his grandson and grandsons grandmother. He is able to participate in some activities of daily living without use of the walker or cane, but is limited because of his knees and heels which he says is from diabetes  Allergies  Allergen Reactions  . Aspirin Other (See Comments)    REACTION: hx of ulcers  . Dust Mite Extract   . Other     Cockroach, grass and trees    Family History  Problem Relation Age of Onset  . Arthritis Mother   . Heart disease Sister     MI  . Heart disease Brother     MI  . Heart disease Brother     MI  . Melanoma Son     Prior to Admission medications   Medication Sig Start  Date End Date Taking? Authorizing Provider  aspirin EC 81 MG tablet Take 81 mg by mouth daily.   Yes Historical Provider, MD  atorvastatin (LIPITOR) 40 MG tablet Take 1 tablet (40 mg total) by mouth daily. 10/10/12  Yes Sandford Craze, NP  cyclobenzaprine (FLEXERIL) 10 MG tablet Take 0.5 tablets (5 mg total) by mouth 3 (three) times daily as needed for muscle spasms. 10/08/12  Yes Shari A Upstill, PA-C  diazepam (VALIUM) 10 MG tablet Take 10 mg by mouth at bedtime as needed for anxiety.   Yes Historical Provider, MD  diltiazem (CARDIZEM CD)  180 MG 24 hr capsule Take 1 capsule (180 mg total) by mouth daily. 10/07/12  Yes Piedad Climes, PA-C  fish oil-omega-3 fatty acids 1000 MG capsule Take 2 g by mouth 2 (two) times daily.     Yes Historical Provider, MD  hydrochlorothiazide (HYDRODIURIL) 25 MG tablet Take 1 tablet (25 mg total) by mouth daily. 05/27/12  Yes Sandford Craze, NP  HYDROcodone-acetaminophen (NORCO) 7.5-325 MG per tablet Take 1 tablet by mouth every 6 (six) hours as needed for pain.   Yes Historical Provider, MD  insulin glargine (LANTUS) 100 units/mL SOLN Inject 15 Units into the skin at bedtime.   Yes Historical Provider, MD  losartan (COZAAR) 100 MG tablet Take 100 mg by mouth daily.   Yes Historical Provider, MD  metFORMIN (GLUCOPHAGE) 1000 MG tablet Take 1,000 mg by mouth 2 (two) times daily with a meal.   Yes Historical Provider, MD  omeprazole (PRILOSEC) 20 MG capsule Take 1 capsule (20 mg total) by mouth daily. 01/05/12  Yes Lesle Chris Black, NP  PARoxetine (PAXIL) 30 MG tablet Take 30 mg by mouth every morning.   Yes Historical Provider, MD  pioglitazone (ACTOS) 30 MG tablet Take 1 tablet (30 mg total) by mouth daily. 08/10/12  Yes Sandford Craze, NP  sildenafil (VIAGRA) 100 MG tablet Take 1 tablet (100 mg total) by mouth daily as needed for erectile dysfunction. 04/27/12  Yes Sandford Craze, NP  tamsulosin (FLOMAX) 0.4 MG CAPS Take 1 capsule (0.4 mg total) by mouth daily. 07/25/12  Yes Sandford Craze, NP   Physical Exam: Filed Vitals:   10/14/12 1400  BP: 117/66  Pulse: 62  Temp:   Resp: 21     General:  Alert and oriented x3, no acute distress  Eyes: Sclera nonicteric extraocular movements are intact  ENT: Normocephalic, atraumatic, patient is a number of small basal cells on his face  Neck: Neck is thick, no JVD  Cardiovascular: Regular rate and rhythm, S1-S2, soft 2/6 systolic ejection murmur  Respiratory: Clear to auscultation bilaterally  Abdomen: Soft, obese, nontender,  positive bowel sounds  Skin: No skin breaks, tears or lesions, he does have a number of skin tags  Musculoskeletal: No clubbing or cyanosis, trace pitting edema  Psychiatric: Patient is appropriate, no evidence of psychoses  Neurologic: No overt deficits  Labs on Admission:  Basic Metabolic Panel:  Recent Labs Lab 10/14/12 1249  NA 137  K 4.3  CL 100  CO2 22  GLUCOSE 156*  BUN 27*  CREATININE 0.94  CALCIUM 9.5   Liver Function Tests:  Recent Labs Lab 10/14/12 1249  AST 19  ALT 16  ALKPHOS 73  BILITOT 0.3  PROT 7.4  ALBUMIN 3.9   No results found for this basename: LIPASE, AMYLASE,  in the last 168 hours No results found for this basename: AMMONIA,  in the last 168 hours CBC:  Recent Labs Lab  10/14/12 1249  WBC 10.0  NEUTROABS 6.3  HGB 12.3*  HCT 37.5*  MCV 86.6  PLT 228   Cardiac Enzymes: No results found for this basename: CKTOTAL, CKMB, CKMBINDEX, TROPONINI,  in the last 168 hours  BNP (last 3 results)  Recent Labs  10/14/12 1249  PROBNP 8.2   CBG: No results found for this basename: GLUCAP,  in the last 168 hours  Radiological Exams on Admission: Dg Chest Port 1 View  10/14/2012   CLINICAL DATA:  Chest pain.  EXAM: PORTABLE CHEST - 1 VIEW  COMPARISON:  Multiple priors  FINDINGS: There are low lung volumes. Pulmonary edema pattern. Left basilar opacity. No pleural effusion or pneumothorax. Cardiac silhouette is enlarged. No acute osseous abnormality.  IMPRESSION: 1. Pulmonary edema with cardiomegaly.  2. Left basilar opacity, likely atelectasis.   Electronically Signed   By: Jerene Dilling M.D.   On: 10/14/2012 13:24    EKG: Independently reviewed. Normal sinus rhythm with questionable left anterior fascicular block  Assessment/Plan Active Problems: Chest pain: Primary problem. Possibly noncardiac, but given risk factors and heart score of 5, will bring in to the hospital to the cardiac observation unit. Cycled troponin x3. Last  echocardiogram done approximately 10 months ago and unremarkable so we'll not repeat.  Have consulted [facility] DIABETES MELLITUS, TYPE II: Check A1c, continue home Lantus plus sliding scale   Hyperlipidemia: Stable.   SLEEP APNEA, OBSTRUCTIVE   HYPERTENSION: Stable.   CHRONIC OBSTRUCTIVE PULMONARY DISEASE   GERD: Stable.    Code Status: Patient confirms full code.  Family Communication: Left message with some  Disposition Plan: If workup negative, home tomorrow  Time spent: 25 minutes  Hollice Espy [facility] Pager [phone]  If 7PM-7AM, please contact night-coverage [email or web address] Password [email or web address] 10/14/2012, 3:36 PM

## 2012-10-14 NOTE — Progress Notes (Signed)
Placed patient on CPAP via auto-mode with minimum pressure at 4cm and maximum pressure at 20cm. Sp02=97% at this time

## 2012-10-15 DIAGNOSIS — Z860100 Personal history of colon polyps, unspecified: Secondary | ICD-10-CM

## 2012-10-15 DIAGNOSIS — F341 Dysthymic disorder: Secondary | ICD-10-CM

## 2012-10-15 DIAGNOSIS — F411 Generalized anxiety disorder: Secondary | ICD-10-CM

## 2012-10-15 DIAGNOSIS — J4489 Other specified chronic obstructive pulmonary disease: Secondary | ICD-10-CM

## 2012-10-15 DIAGNOSIS — J449 Chronic obstructive pulmonary disease, unspecified: Secondary | ICD-10-CM

## 2012-10-15 LAB — TROPONIN I
Troponin I: <0.3 ng/mL (ref <0.30)
Troponin I: <0.3 ng/mL (ref <0.30)

## 2012-10-15 LAB — GLUCOSE, CAPILLARY: Glucose-Capillary: 140 mg/dL — ABNORMAL HIGH (ref 70–99)

## 2012-10-15 MED ORDER — NITROGLYCERIN 0.4 MG SL SUBL: 0.4000 mg | SUBLINGUAL_TABLET | SUBLINGUAL | Status: DC | PRN

## 2012-10-15 NOTE — Discharge Summary (Signed)
Physician Discharge Summary  Patient ID: Christian Morris MRN: [id number] DOB/AGE: February 20, 1940 72 y.o.  Admit date: 10/14/2012 Discharge date: 10/15/2012  Primary Care Physician:  Lemont Fillers., NP  Discharge Diagnoses:    Atypical chest pain resolved  . CHRONIC OBSTRUCTIVE PULMONARY DISEASE . SLEEP APNEA, OBSTRUCTIVE . GERD . (Resolved) Chest pain . HYPERTENSION . Hyperlipidemia . DIABETES MELLITUS, TYPE II  Consults: [facility], Dr. Shirlee Latch   Recommendations for Outpatient Follow-up:  1. patient will have outpatient stress test arranged with [facility]. I have discussed with Dr. Graciela Husbands this morning and he will send an e-mail to the office to set up the stress test.  Allergies:   Allergies  Allergen Reactions  . Aspirin Other (See Comments)    REACTION: hx of ulcers  . Dust Mite Extract   . Other     Cockroach, grass and trees     Discharge Medications:   Medication List         aspirin EC 81 MG tablet  Take 81 mg by mouth daily.     atorvastatin 40 MG tablet  Commonly known as:  LIPITOR  Take 1 tablet (40 mg total) by mouth daily.     cyclobenzaprine 10 MG tablet  Commonly known as:  FLEXERIL  Take 0.5 tablets (5 mg total) by mouth 3 (three) times daily as needed for muscle spasms.     diazepam 10 MG tablet  Commonly known as:  VALIUM  Take 10 mg by mouth at bedtime as needed for anxiety.     diltiazem 180 MG 24 hr capsule  Commonly known as:  CARDIZEM CD  Take 1 capsule (180 mg total) by mouth daily.     fish oil-omega-3 fatty acids 1000 MG capsule  Take 2 g by mouth 2 (two) times daily.     hydrochlorothiazide 25 MG tablet  Commonly known as:  HYDRODIURIL  Take 1 tablet (25 mg total) by mouth daily.     HYDROcodone-acetaminophen 7.5-325 MG per tablet  Commonly known as:  NORCO  Take 1 tablet by mouth every 6 (six) hours as needed for pain.     insulin glargine 100 units/mL Soln  Commonly known as:  LANTUS  Inject 15  Units into the skin at bedtime.     losartan 100 MG tablet  Commonly known as:  COZAAR  Take 100 mg by mouth daily.     metFORMIN 1000 MG tablet  Commonly known as:  GLUCOPHAGE  Take 1,000 mg by mouth 2 (two) times daily with a meal.     nitroGLYCERIN 0.4 MG SL tablet  Commonly known as:  NITROSTAT  Place 1 tablet (0.4 mg total) under the tongue every 5 (five) minutes x 3 doses as needed for chest pain (donot take with viagra).     omeprazole 20 MG capsule  Commonly known as:  PRILOSEC  Take 1 capsule (20 mg total) by mouth daily.     PARoxetine 30 MG tablet  Commonly known as:  PAXIL  Take 30 mg by mouth every morning.     pioglitazone 30 MG tablet  Commonly known as:  ACTOS  Take 1 tablet (30 mg total) by mouth daily.     sildenafil 100 MG tablet  Commonly known as:  VIAGRA  Take 1 tablet (100 mg total) by mouth daily as needed for erectile dysfunction.     tamsulosin 0.4 MG Caps capsule  Commonly known as:  FLOMAX  Take 1 capsule (0.4 mg total) by mouth  daily.         Brief H and P: For complete details please refer to admission H and P, but in brief patient is a 72 year old male with history of diabetes,  hypertension and hyperlipidemia who was in usual state of health when he started developing today bilateral sudden onset chest heaviness. He said it is a for someone was sitting on him. He came into the emergency room and lab work initially was unimpressive. EKG looked to be unchanged. With IV morphine, pain resolved. Pain was located across his entire anterior chest. No radiation to the back. No reason to jaw. No associated shortness of breath. Oxygen saturations were normal. Patient is a previous heart history that he is aware of. Echocardiogram done in December of last year was unremarkable. Lab work was normal. Given extensive risk factors, the best for patient to be further evaluated. Interestingly, chest x-ray noted effusions, but BNP was only 8.  Given no complaint  of shortness of breath, normal respiratory rate and oxygenation, d-dimer not done.  Hospital Course:  Patient is a 72 year old male with history of diabetes, hypertension who had presented with chest pain/heaviness. Patient was admitted for rule out ACS. Atypical chest pain: Completely resolved. Patient had no dyspnea or palpitations. EKG was negative for acute ischemic changes. Patient had no history of exertional symptoms. Cardiac enzymes remained negative. [facility] was consulted given his multiple cardiac risk factors, patient was seen by Dr. Ronelle Nigh, who also felt that likely current presentation is noncardiac. If negative, patient can be discharged home and outpatient stress test will be arranged. Patient will continue his current medications. I discussed in detail with Dr. Graciela Husbands who also sent email to the office this morning to arrange the stress test.  Diabetes mellitus: Uncontrolled, hemoglobin A1c is 9.0 which has somewhat improved from 08/09/2012 when it was 9.5. I did explain to the patient that controlling his blood sugars will also overall improve his risk factors. Patient will discuss with his PCP on the next appointment on 10/25/2012.    Day of Discharge BP 136/63  Pulse 53  Temp(Src) 98 F (36.7 C) (Oral)  Resp 20  Ht 6\' 1"  (1.854 m)  Wt 133.04 kg (293 lb 4.8 oz)  BMI 38.7 kg/m2  SpO2 96%  Physical Exam: General: Alert and awake oriented x3 not in any acute distress. CVS: S1-S2 clear 2/6 SEM Chest: clear to auscultation bilaterally, no wheezing rales or rhonchi Abdomen: soft nontender, nondistended, normal bowel sounds Extremities: no cyanosis, clubbing or edema noted bilaterally    The results of significant diagnostics from this hospitalization (including imaging, microbiology, ancillary and laboratory) are listed below for reference.    LAB RESULTS: Basic Metabolic Panel:  Recent Labs Lab 10/14/12 1249  NA 137  K 4.3  CL 100  CO2 22  GLUCOSE 156*   BUN 27*  CREATININE 0.94  CALCIUM 9.5   Liver Function Tests:  Recent Labs Lab 10/14/12 1249  AST 19  ALT 16  ALKPHOS 73  BILITOT 0.3  PROT 7.4  ALBUMIN 3.9   No results found for this basename: LIPASE, AMYLASE,  in the last 168 hours No results found for this basename: AMMONIA,  in the last 168 hours CBC:  Recent Labs Lab 10/14/12 1249  WBC 10.0  NEUTROABS 6.3  HGB 12.3*  HCT 37.5*  MCV 86.6  PLT 228   Cardiac Enzymes:  Recent Labs Lab 10/14/12 2344 10/15/12 0500  TROPONINI <0.30 <0.30   BNP: No components found  with this basename: POCBNP,  CBG:  Recent Labs Lab 10/14/12 2126 10/15/12 0750  GLUCAP 137* 140*    Significant Diagnostic Studies:  Dg Chest Port 1 View  10/14/2012   CLINICAL DATA:  Chest pain.  EXAM: PORTABLE CHEST - 1 VIEW  COMPARISON:  Multiple priors  FINDINGS: There are low lung volumes. Pulmonary edema pattern. Left basilar opacity. No pleural effusion or pneumothorax. Cardiac silhouette is enlarged. No acute osseous abnormality.  IMPRESSION: 1. Pulmonary edema with cardiomegaly.  2. Left basilar opacity, likely atelectasis.   Electronically Signed   By: Jerene Dilling M.D.   On: 10/14/2012 13:24       Disposition and Follow-up:     Discharge Orders   Future Appointments Provider Department Dept Phone   10/25/2012 10:30 AM Sandford Craze, NP [facility] at  [facility] [phone]   12/26/2012 10:30 AM Sandford Craze, NP [facility]  [facility] [phone]   Future Orders Complete By Expires   Diet Carb Modified  As directed    Discharge instructions  As directed    Comments:     1) Please discuss with your Primary doctor about your diabetes, needs to better controlled  2) Your stress test will be arranged with [facility] office, please call their office on Monday.   Increase activity slowly  As directed        DISPOSITION: Home  DIET: Carb modified diet  ACTIVITY: As  tolerated    DISCHARGE FOLLOW-UP Follow-up Information   Follow up with Lemont Fillers., NP On 10/25/2012. (at 10:30AM)    Specialty:  Internal Medicine   Contact information:   [address] [place] [address] [phone]       Follow up with Marca Ancona, MD. Call on 10/17/2012. (for stress test to be arranged by [facility] cardiology)    Specialty:  Cardiology   Contact information:   [address] [place] [address] [phone]       Time spent on Discharge: 35 mins Signed:   Carzell Saldivar M.D. [facility] 10/15/2012, 8:41 AM Pager: [phone]

## 2012-10-17 ENCOUNTER — Telehealth: Payer: Self-pay | Admitting: *Deleted

## 2012-10-17 NOTE — Telephone Encounter (Signed)
Received message from pt that he is not wanting to proceed with the stress test that was recommended when pt was in the hospital. Pt states he had a stress test "not long ago and was told it was heartburn".  Please advise.
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
## 2012-10-17 NOTE — Telephone Encounter (Signed)
It has been >1 year since his last stress test.  I would recommend that he follow through with stress test but it is ultimately up to him.

## 2012-10-17 NOTE — Telephone Encounter (Signed)
Notified pt and he states he is agreeable to proceed with stress test referral. I can't tell if order has been placed. Pt has follow up with [place] on 10/25/12. Do we need to make this a 30 minute slot if possible?

## 2012-10-18 NOTE — Telephone Encounter (Signed)
Dr. Graciela Husbands, could you please check with your staff- I do not see that stress test has been ordered yet. Thank you!

## 2012-10-24 ENCOUNTER — Telehealth: Payer: Self-pay | Admitting: Family

## 2012-10-24 ENCOUNTER — Encounter (HOSPITAL_COMMUNITY): Payer: Self-pay | Admitting: Emergency Medicine

## 2012-10-24 ENCOUNTER — Emergency Department (HOSPITAL_COMMUNITY)
Admission: EM | Admit: 2012-10-24 | Discharge: 2012-10-24 | Disposition: A | Discharge status: Home / Self Care | Payer: Medicare Other | Attending: Emergency Medicine | Admitting: Emergency Medicine

## 2012-10-24 ENCOUNTER — Emergency Department (HOSPITAL_COMMUNITY): Payer: Medicare Other

## 2012-10-24 DIAGNOSIS — N201 Calculus of ureter: Secondary | ICD-10-CM | POA: Insufficient documentation

## 2012-10-24 DIAGNOSIS — F329 Major depressive disorder, single episode, unspecified: Secondary | ICD-10-CM | POA: Insufficient documentation

## 2012-10-24 DIAGNOSIS — E291 Testicular hypofunction: Secondary | ICD-10-CM | POA: Insufficient documentation

## 2012-10-24 DIAGNOSIS — Z85828 Personal history of other malignant neoplasm of skin: Secondary | ICD-10-CM | POA: Insufficient documentation

## 2012-10-24 DIAGNOSIS — Z87891 Personal history of nicotine dependence: Secondary | ICD-10-CM | POA: Insufficient documentation

## 2012-10-24 DIAGNOSIS — Z8719 Personal history of other diseases of the digestive system: Secondary | ICD-10-CM | POA: Insufficient documentation

## 2012-10-24 DIAGNOSIS — Z87442 Personal history of urinary calculi: Secondary | ICD-10-CM | POA: Insufficient documentation

## 2012-10-24 DIAGNOSIS — Z888 Allergy status to other drugs, medicaments and biological substances status: Secondary | ICD-10-CM | POA: Insufficient documentation

## 2012-10-24 DIAGNOSIS — N200 Calculus of kidney: Secondary | ICD-10-CM

## 2012-10-24 DIAGNOSIS — Z79899 Other long term (current) drug therapy: Secondary | ICD-10-CM | POA: Insufficient documentation

## 2012-10-24 DIAGNOSIS — Z8669 Personal history of other diseases of the nervous system and sense organs: Secondary | ICD-10-CM | POA: Insufficient documentation

## 2012-10-24 DIAGNOSIS — I10 Essential (primary) hypertension: Secondary | ICD-10-CM | POA: Insufficient documentation

## 2012-10-24 DIAGNOSIS — M129 Arthropathy, unspecified: Secondary | ICD-10-CM | POA: Insufficient documentation

## 2012-10-24 DIAGNOSIS — F3289 Other specified depressive episodes: Secondary | ICD-10-CM | POA: Insufficient documentation

## 2012-10-24 DIAGNOSIS — E119 Type 2 diabetes mellitus without complications: Secondary | ICD-10-CM | POA: Insufficient documentation

## 2012-10-24 DIAGNOSIS — N1330 Unspecified hydronephrosis: Secondary | ICD-10-CM | POA: Insufficient documentation

## 2012-10-24 DIAGNOSIS — E785 Hyperlipidemia, unspecified: Secondary | ICD-10-CM | POA: Insufficient documentation

## 2012-10-24 DIAGNOSIS — Z7982 Long term (current) use of aspirin: Secondary | ICD-10-CM | POA: Insufficient documentation

## 2012-10-24 DIAGNOSIS — K7689 Other specified diseases of liver: Secondary | ICD-10-CM | POA: Insufficient documentation

## 2012-10-24 DIAGNOSIS — K219 Gastro-esophageal reflux disease without esophagitis: Secondary | ICD-10-CM | POA: Insufficient documentation

## 2012-10-24 DIAGNOSIS — Z794 Long term (current) use of insulin: Secondary | ICD-10-CM | POA: Insufficient documentation

## 2012-10-24 DIAGNOSIS — Z9189 Other specified personal risk factors, not elsewhere classified: Secondary | ICD-10-CM | POA: Insufficient documentation

## 2012-10-24 LAB — CBC WITH DIFFERENTIAL/PLATELET
Basophils Absolute: 0.1 10*3/uL (ref 0.0–0.1)
Eosinophils Relative: 1 % (ref 0–5)
Hemoglobin: 13.4 g/dL (ref 13.0–17.0)
Lymphocytes Relative: 26 % (ref 12–46)
MCV: 86.9 fL (ref 78.0–100.0)
Monocytes Absolute: 0.6 10*3/uL (ref 0.1–1.0)
Monocytes Relative: 6 % (ref 3–12)
Platelets: 235 10*3/uL (ref 150–400)
RBC: 4.66 MIL/uL (ref 4.22–5.81)
RDW: 13 % (ref 11.5–15.5)
WBC: 10.7 10*3/uL — ABNORMAL HIGH (ref 4.0–10.5)

## 2012-10-24 LAB — URINE MICROSCOPIC-ADD ON

## 2012-10-24 LAB — POCT I-STAT, CHEM 8
BUN: 28 mg/dL — ABNORMAL HIGH (ref 6–23)
Glucose, Bld: 204 mg/dL — ABNORMAL HIGH (ref 70–99)
HCT: 43 % (ref 39.0–52.0)
Hemoglobin: 14.6 g/dL (ref 13.0–17.0)
Sodium: 140 mEq/L (ref 135–145)
TCO2: 26 mmol/L (ref 0–100)

## 2012-10-24 LAB — URINALYSIS, ROUTINE W REFLEX MICROSCOPIC
Protein, ur: 100 mg/dL — AB
Urobilinogen, UA: 0.2 mg/dL (ref 0.0–1.0)
pH: 5 (ref 5.0–8.0)

## 2012-10-24 MED ORDER — OXYCODONE-ACETAMINOPHEN 5-325 MG PO TABS: 2.0000 | ORAL_TABLET | Freq: Four times a day (QID) | ORAL | Status: DC | PRN

## 2012-10-24 MED ORDER — MORPHINE SULFATE 4 MG/ML IJ SOLN
6.0000 mg | Freq: Once | INTRAMUSCULAR | Status: AC
  Administered 2012-10-24: 6 mg via INTRAVENOUS
  Filled 2012-10-24: qty 2

## 2012-10-24 MED ORDER — ONDANSETRON HCL 4 MG/2ML IJ SOLN
4.0000 mg | Freq: Once | INTRAMUSCULAR | Status: AC
  Administered 2012-10-24: 4 mg via INTRAVENOUS
  Filled 2012-10-24: qty 2

## 2012-10-24 MED ORDER — OXYCODONE-ACETAMINOPHEN 5-325 MG PO TABS
1.0000 | ORAL_TABLET | Freq: Once | ORAL | Status: AC
  Administered 2012-10-24: 1 via ORAL
  Filled 2012-10-24: qty 1

## 2012-10-24 NOTE — ED Provider Notes (Addendum)
CSN: [id number]     Arrival date & time 10/24/12  4098 History   First MD Initiated Contact with Patient 10/24/12 [phone]     Chief Complaint  Patient presents with  . Abdominal Pain   (Consider location/radiation/quality/duration/timing/severity/associated sxs/prior Treatment) Patient is a 72 y.o. male presenting with abdominal pain. The history is provided by the patient.  Abdominal Pain Pain location:  L flank Pain quality: sharp, stabbing and throbbing   Pain radiates to:  LLQ Pain severity:  Severe Onset quality:  Sudden Duration:  3 hours Timing:  Constant Progression:  Unchanged Chronicity:  New Context: awakening from sleep   Context: not diet changes, not eating, not medication withdrawal, not previous surgeries, not retching, not suspicious food intake and not trauma   Relieved by:  Nothing Exacerbated by: attempting to urinate. Ineffective treatments:  None tried Associated symptoms: no anorexia, no chest pain, no constipation, no cough, no diarrhea, no dysuria, no fever, no nausea, no shortness of breath and no vomiting   Risk factors: being elderly and recent hospitalization   Risk factors: no alcohol abuse and no aspirin use     Past Medical History  Diagnosis Date  . Arthritis   . Depression   . Diabetes mellitus   . Hypertension   . Hyperlipidemia   . History of gastric ulcer     Age 37- no recurrence since.  . Transfusion history     due to bleeding ulcer at age 15  . Hypogonadism male 01/11/2011  . Squamous cell carcinoma in situ of skin of forearm 01/23/2011  . Lung nodule     right 16 mm, seen initially 6/12. 13mm in 08/2011.  [name] GERD (gastroesophageal reflux disease)   . Headache(784.0)   . Fatty liver disease, nonalcoholic    Past Surgical History  Procedure Laterality Date  . Eye surgery  08/2009    cataract removal left eye--Dr Dione Booze  . Melanoma excision      left forearm   Family History  Problem Relation Age of Onset  . Arthritis Mother   .  Heart disease Sister     Massive MI age 26.  . Arrhythmia Brother   . Melanoma Son    History  Substance Use Topics  . Smoking status: Former Smoker -- 1.00 packs/day for 25 years    Types: Cigarettes    Quit date: 01/05/1982  . Smokeless tobacco: Never Used  . Alcohol Use: No    Review of Systems  Constitutional: Negative for fever.  Respiratory: Negative for cough and shortness of breath.   Cardiovascular: Negative for chest pain.  Gastrointestinal: Positive for abdominal pain. Negative for nausea, vomiting, diarrhea, constipation and anorexia.  Genitourinary: Negative for dysuria.  All other systems reviewed and are negative.    Allergies  Aspirin; Dust mite extract; and Other  Home Medications   Current Outpatient Rx  Name  Route  Sig  Dispense  Refill  . aspirin EC 81 MG tablet   Oral   Take 81 mg by mouth daily.         [name] atorvastatin (LIPITOR) 40 MG tablet   Oral   Take 1 tablet (40 mg total) by mouth daily.   30 tablet   3   . cyclobenzaprine (FLEXERIL) 10 MG tablet   Oral   Take 0.5 tablets (5 mg total) by mouth 3 (three) times daily as needed for muscle spasms.   15 tablet   0   . diazepam (VALIUM) 10 MG tablet  Oral   Take 10 mg by mouth at bedtime as needed for anxiety.         [name] diltiazem (CARDIZEM CD) 180 MG 24 hr capsule   Oral   Take 1 capsule (180 mg total) by mouth daily.   30 capsule   3   . fish oil-omega-3 fatty acids 1000 MG capsule   Oral   Take 2 g by mouth 2 (two) times daily.           . hydrochlorothiazide (HYDRODIURIL) 25 MG tablet   Oral   Take 1 tablet (25 mg total) by mouth daily.   30 tablet   5   . HYDROcodone-acetaminophen (NORCO) 7.5-325 MG per tablet   Oral   Take 1 tablet by mouth every 6 (six) hours as needed for pain.         [name] insulin glargine (LANTUS) 100 units/mL SOLN   Subcutaneous   Inject 15 Units into the skin at bedtime.         [name] losartan (COZAAR) 100 MG tablet   Oral   Take 100 mg  by mouth daily.         . metFORMIN (GLUCOPHAGE) 1000 MG tablet   Oral   Take 1,000 mg by mouth 2 (two) times daily with a meal.         . nitroGLYCERIN (NITROSTAT) 0.4 MG SL tablet   Sublingual   Place 1 tablet (0.4 mg total) under the tongue every 5 (five) minutes x 3 doses as needed for chest pain (donot take with viagra).   60 tablet   12   . omeprazole (PRILOSEC) 20 MG capsule   Oral   Take 1 capsule (20 mg total) by mouth daily.         [name] PARoxetine (PAXIL) 30 MG tablet   Oral   Take 30 mg by mouth every morning.         . pioglitazone (ACTOS) 30 MG tablet   Oral   Take 1 tablet (30 mg total) by mouth daily.   30 tablet   2   . sildenafil (VIAGRA) 100 MG tablet   Oral   Take 1 tablet (100 mg total) by mouth daily as needed for erectile dysfunction.   5 tablet   2   . tamsulosin (FLOMAX) 0.4 MG CAPS   Oral   Take 1 capsule (0.4 mg total) by mouth daily.   30 capsule   3    BP 129/79  Pulse 66  Temp(Src) 97.8 F (36.6 C) (Oral)  Resp 20  Ht 6\' 1"  (1.854 m)  Wt 290 lb (131.543 kg)  BMI 38.27 kg/m2  SpO2 98% Physical Exam  Nursing note and vitals reviewed. Constitutional: He is oriented to person, place, and time. He appears well-developed and well-nourished. He appears distressed.  HENT:  Head: Normocephalic and atraumatic.  Mouth/Throat: Oropharynx is clear and moist.  Eyes: Conjunctivae and EOM are normal. Pupils are equal, round, and reactive to light.  Neck: Normal range of motion. Neck supple.  Cardiovascular: Normal rate, regular rhythm and intact distal pulses.   No murmur heard. Pulmonary/Chest: Effort normal and breath sounds normal. No respiratory distress. He has no wheezes. He has no rales.  Abdominal: Soft. Normal appearance. He exhibits no distension. There is no tenderness. There is CVA tenderness. There is no rebound and no guarding.  Left flank tenderness.  No palpable pulsating mass  Musculoskeletal: Normal range of motion. He  exhibits no edema and  no tenderness.  Neurological: He is alert and oriented to person, place, and time.  Skin: Skin is warm and dry. No rash noted. No erythema.  Psychiatric: He has a normal mood and affect. His behavior is normal.    ED Course  Procedures (including critical care time) Labs Review Labs Reviewed  URINALYSIS, ROUTINE W REFLEX MICROSCOPIC - Abnormal; Notable for the following:    Color, Urine AMBER (*)    APPearance CLOUDY (*)    Hgb urine dipstick LARGE (*)    Bilirubin Urine SMALL (*)    Protein, ur 100 (*)    Leukocytes, UA SMALL (*)    All other components within normal limits  CBC WITH DIFFERENTIAL - Abnormal; Notable for the following:    WBC 10.7 (*)    All other components within normal limits  URINE MICROSCOPIC-ADD ON - Abnormal; Notable for the following:    Bacteria, UA FEW (*)    Crystals URIC ACID CRYSTALS (*)    All other components within normal limits  POCT I-STAT, CHEM 8 - Abnormal; Notable for the following:    BUN 28 (*)    Creatinine, Ser 1.40 (*)    Glucose, Bld 204 (*)    All other components within normal limits   Imaging Review Ct Abdomen Pelvis Wo Contrast  10/24/2012   CLINICAL DATA:  Diabetic hypertensive patient with history of gastric ulcer presenting with the left-sided pain.  EXAM: CT ABDOMEN AND PELVIS WITHOUT CONTRAST  TECHNIQUE: Multidetector CT imaging of the abdomen and pelvis was performed following the standard protocol without intravenous contrast.  COMPARISON:  06/14/2012 CT.  FINDINGS: 5 mm distal left ureteral obstructing stone located 5 cm proximal to the left ureterovesical junction with moderate left-sided hydronephrosis. 5 mm nonobstructing left lower pole renal stone.  Taking into account limitation by non contrast imaging, no worrisome hepatic, splenic, pancreatic, renal or adrenal lesion. Fatty infiltration of the liver. Tiny nodularity pancreatic tail/ splenic hilum junction unchanged.  Dense bile without calcified  gallstone.  Coronary artery calcifications. Heart is slightly enlarged. Aortic valve calcifications.  Atherosclerotic type changes of the abdominal aorta with ectasia. No focal aneurysm. Atherosclerotic type changes iliac arteries and femoral arteries.  12 mm granuloma right lung base.  Under distended urinary bladder without gross abnormality. Prostate gland minimally lobular in contour. Clinical and laboratory correlation recommended.  No extra luminal bowel inflammatory process, free fluid or free air.  L5 bilateral pars defect with the anterior slip of L5 and prominent bilateral L5-S1 foraminal narrowing.  IMPRESSION: 5 mm distal left ureteral obstructing stone located 5 cm proximal to the left ureterovesical junction with moderate left-sided hydronephrosis. 5 mm nonobstructing left lower pole renal stone.  Fatty infiltration of the liver.  Coronary artery calcifications. Heart is slightly enlarged. Aortic valve calcifications.  Atherosclerotic type changes of the abdominal aorta with ectasia. No focal aneurysm. Atherosclerotic type changes iliac arteries and femoral arteries.  12 mm granuloma right lung base.  Prostate gland minimally lobular in contour. Clinical and laboratory correlation recommended.  L5 bilateral pars defect with the anterior slip of L5 and prominent bilateral L5-S1 foraminal narrowing.   Electronically Signed   By: Bridgett Larsson M.D.   On: 10/24/2012 08:53    EKG Interpretation   None       MDM   1. Kidney stone on left side     Pt with symptoms consistent with kidney stone that started 3 hours ago.  Denies infectious sx, or GI symptoms.  Low  concern for diverticulitis and told by PCP he had KS about 1 year ago.  Normal pulses throughout, normotensive and no abd pain.  Low suspicion for AAA.   Will treat pain and ensure no infection with UA, CBC,i-stat and will get stone study to further eval.  9:09 AM Patient with an elevation of his creatinine from baseline from 0.9-1.4.  CT shows a 5 mm distal obstructing left-sided ureteral stone with hydronephrosis. On reevaluation patient is still complaining of pain in getting a second dose of morphine however will hold on giving Toradol due to creatinine.   11:43 AM Patient is feeling better and pain is now controlled. We'll discharge home with pain control and given strict return precautions  Gwyneth Sprout, MD 10/24/12 1143  Gwyneth Sprout, MD 10/24/12 1145

## 2012-10-24 NOTE — ED Notes (Signed)
Denies nausea, hematuria. States pain woke him up 3 hrs ago, also noticed hesitancy & having difficulty voiding.  States pain has moved from side now more towards front.

## 2012-10-24 NOTE — ED Notes (Signed)
C/o left side pain now radiating into LLQ x 3 hrs. Also difficulty urinating

## 2012-10-24 NOTE — ED Notes (Signed)
Patient transported to CT 

## 2012-10-24 NOTE — Telephone Encounter (Signed)
Please call pt and arrange a 1 week ED follow up. 

## 2012-10-25 ENCOUNTER — Other Ambulatory Visit: Payer: Self-pay | Admitting: *Deleted

## 2012-10-25 ENCOUNTER — Ambulatory Visit: Payer: Medicare Other | Admitting: Family

## 2012-10-25 DIAGNOSIS — R079 Chest pain, unspecified: Secondary | ICD-10-CM

## 2012-10-25 NOTE — Telephone Encounter (Signed)
Will take care of  Thanks  prob low yield  He has had multiple neg evaluations

## 2012-10-25 NOTE — Telephone Encounter (Signed)
Patient scheduled appointment for 10/28/12

## 2012-10-26 ENCOUNTER — Telehealth: Payer: Self-pay | Admitting: *Deleted

## 2012-10-26 MED ORDER — DIAZEPAM 10 MG PO TABS: 10.0000 mg | ORAL_TABLET | Freq: Every evening | ORAL | Status: DC | PRN

## 2012-10-26 MED ORDER — HYDROCODONE-ACETAMINOPHEN 7.5-325 MG PO TABS: 1.0000 | ORAL_TABLET | Freq: Four times a day (QID) | ORAL | Status: DC | PRN

## 2012-10-26 NOTE — Telephone Encounter (Signed)
Refill given -- Valium quantity 30 w/ no refills; Norco quantity 30 w/ no refills.

## 2012-10-26 NOTE — Telephone Encounter (Signed)
Pt called requesting refills of diazepam and lortab. Both last refilled 09/26/12.  Pt has follow up on Friday.  Please advise.

## 2012-10-26 NOTE — Telephone Encounter (Signed)
OK to send 1 month refill of each please.

## 2012-10-26 NOTE — Telephone Encounter (Signed)
Rxs printed for signature and forwarded to PA since Provider is out of the office until Friday.

## 2012-10-27 ENCOUNTER — Other Ambulatory Visit: Payer: Self-pay | Admitting: *Deleted

## 2012-10-28 ENCOUNTER — Ambulatory Visit (HOSPITAL_BASED_OUTPATIENT_CLINIC_OR_DEPARTMENT_OTHER)
Admission: RE | Admit: 2012-10-28 | Discharge: 2012-10-28 | Disposition: A | Discharge status: Home / Self Care | Payer: Medicare Other | Source: Ambulatory Visit | Attending: Family | Admitting: Family

## 2012-10-28 ENCOUNTER — Encounter: Payer: Self-pay | Admitting: Family

## 2012-10-28 ENCOUNTER — Ambulatory Visit (INDEPENDENT_AMBULATORY_CARE_PROVIDER_SITE_OTHER): Payer: Medicare Other | Admitting: Family

## 2012-10-28 ENCOUNTER — Telehealth: Payer: Self-pay | Admitting: Family

## 2012-10-28 ENCOUNTER — Telehealth: Payer: Self-pay | Admitting: *Deleted

## 2012-10-28 VITALS — BP 126/80 | HR 67 | Temp 97.4°F | Resp 18 | Ht 70.0 in | Wt 293.1 lb

## 2012-10-28 DIAGNOSIS — R0789 Other chest pain: Secondary | ICD-10-CM | POA: Insufficient documentation

## 2012-10-28 DIAGNOSIS — N200 Calculus of kidney: Secondary | ICD-10-CM

## 2012-10-28 DIAGNOSIS — N400 Enlarged prostate without lower urinary tract symptoms: Secondary | ICD-10-CM

## 2012-10-28 DIAGNOSIS — E119 Type 2 diabetes mellitus without complications: Secondary | ICD-10-CM

## 2012-10-28 DIAGNOSIS — R109 Unspecified abdominal pain: Secondary | ICD-10-CM | POA: Insufficient documentation

## 2012-10-28 DIAGNOSIS — N201 Calculus of ureter: Secondary | ICD-10-CM | POA: Insufficient documentation

## 2012-10-28 MED ORDER — METFORMIN HCL 1000 MG PO TABS: 1000.0000 mg | ORAL_TABLET | Freq: Two times a day (BID) | ORAL | Status: DC
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
## 2012-10-28 NOTE — Assessment & Plan Note (Signed)
Still present on KUB, continues flomax, obtain bmet, refer to urology for further evaluation.

## 2012-10-28 NOTE — Assessment & Plan Note (Signed)
Resolved. Advised pt to follow through with stress test.

## 2012-10-28 NOTE — Telephone Encounter (Signed)
Opened in error

## 2012-10-28 NOTE — Telephone Encounter (Signed)
Notified pt and he voices understanding. 

## 2012-10-28 NOTE — Telephone Encounter (Signed)
Please call pt and let him know that his x ray shows persistent kidney stone on left. He should continue flomax.   I would like to refer him to urology.

## 2012-10-28 NOTE — Telephone Encounter (Signed)
Spoke to Hospital doctor re: follow up CT abdomen/pelvis. They are requesting KUB/US first before they can approve CT.  Will order.

## 2012-10-28 NOTE — Assessment & Plan Note (Signed)
Check psa due to radiologist recommendations.

## 2012-10-28 NOTE — Assessment & Plan Note (Signed)
Uncontrolled. Increase lantus to 20 units. Check sugars bid, bring log to follow up.

## 2012-10-28 NOTE — Patient Instructions (Signed)
Please increase lantus to 20 units once daily. Complete lab work prior to leaving. Complete CT on the first floor to make sure that your kidney stone has passed. Follow up in 1 month- bring your log of sugars with you to this visit.

## 2012-10-29 LAB — BASIC METABOLIC PANEL
BUN: 30 mg/dL — ABNORMAL HIGH (ref 6–23)
CO2: 25 mEq/L (ref 19–32)
Calcium: 10.2 mg/dL (ref 8.4–10.5)
Creat: 1.14 mg/dL (ref 0.50–1.35)
Glucose, Bld: 170 mg/dL — ABNORMAL HIGH (ref 70–99)

## 2012-10-29 LAB — PSA, TOTAL AND FREE: PSA, Free: 0.63 ng/mL

## 2012-10-31 ENCOUNTER — Telehealth: Payer: Self-pay | Admitting: Family

## 2012-10-31 ENCOUNTER — Encounter: Payer: Self-pay | Admitting: Family

## 2012-10-31 ENCOUNTER — Other Ambulatory Visit: Payer: Self-pay | Admitting: Family

## 2012-10-31 MED ORDER — PIOGLITAZONE HCL 30 MG PO TABS: 30.0000 mg | ORAL_TABLET | Freq: Every day | ORAL | Status: DC

## 2012-10-31 NOTE — Telephone Encounter (Signed)
Notified pt and he voices understanding. 

## 2012-10-31 NOTE — Telephone Encounter (Signed)
Refill- actos 30mg  tab. Take one tablet by mouth once daily. Qty 30 last fill 9.28.14

## 2012-10-31 NOTE — Telephone Encounter (Signed)
Christian Morris, I am cancelling referral to urology.

## 2012-10-31 NOTE — Telephone Encounter (Signed)
Side pain could be muscular. Call if worsening symptoms.

## 2012-10-31 NOTE — Telephone Encounter (Signed)
Prostate test is normal.

## 2012-10-31 NOTE — Addendum Note (Signed)
Addended by: Sandford Craze on: 10/31/2012 07:58 AM   Modules accepted: Orders

## 2012-10-31 NOTE — Telephone Encounter (Signed)
Refill sent.

## 2012-10-31 NOTE — Telephone Encounter (Signed)
Please call pt and let him know that I also reviewed the renal ultrasound. This is improved and shows that the stone that was causing blockage has passed.  I do not think that he needs to see urology.  I will cancel referral.  Follow up blood work shows that his kidney function has returned to normal.

## 2012-10-31 NOTE — Telephone Encounter (Signed)
Notified pt and he voices understanding. He states that he continues to have intermittent side pain. Had episode of left side pain this morning.

## 2012-11-02 ENCOUNTER — Ambulatory Visit (HOSPITAL_COMMUNITY): Payer: Medicare Other | Attending: Cardiology | Admitting: Radiology

## 2012-11-02 VITALS — BP 110/63 | Ht 70.0 in | Wt 290.0 lb

## 2012-11-02 DIAGNOSIS — E785 Hyperlipidemia, unspecified: Secondary | ICD-10-CM | POA: Insufficient documentation

## 2012-11-02 DIAGNOSIS — J449 Chronic obstructive pulmonary disease, unspecified: Secondary | ICD-10-CM | POA: Insufficient documentation

## 2012-11-02 DIAGNOSIS — E119 Type 2 diabetes mellitus without complications: Secondary | ICD-10-CM | POA: Insufficient documentation

## 2012-11-02 DIAGNOSIS — Z8249 Family history of ischemic heart disease and other diseases of the circulatory system: Secondary | ICD-10-CM | POA: Insufficient documentation

## 2012-11-02 DIAGNOSIS — R079 Chest pain, unspecified: Secondary | ICD-10-CM | POA: Insufficient documentation

## 2012-11-02 DIAGNOSIS — Z794 Long term (current) use of insulin: Secondary | ICD-10-CM | POA: Insufficient documentation

## 2012-11-02 DIAGNOSIS — I10 Essential (primary) hypertension: Secondary | ICD-10-CM | POA: Insufficient documentation

## 2012-11-02 DIAGNOSIS — R0989 Other specified symptoms and signs involving the circulatory and respiratory systems: Secondary | ICD-10-CM | POA: Insufficient documentation

## 2012-11-02 DIAGNOSIS — R0609 Other forms of dyspnea: Secondary | ICD-10-CM | POA: Insufficient documentation

## 2012-11-02 DIAGNOSIS — R0602 Shortness of breath: Secondary | ICD-10-CM

## 2012-11-02 DIAGNOSIS — J4489 Other specified chronic obstructive pulmonary disease: Secondary | ICD-10-CM | POA: Insufficient documentation

## 2012-11-02 MED ORDER — REGADENOSON 0.4 MG/5ML IV SOLN
0.4000 mg | Freq: Once | INTRAVENOUS | Status: AC
  Administered 2012-11-02: 0.4 mg via INTRAVENOUS

## 2012-11-02 MED ORDER — TECHNETIUM TC 99M SESTAMIBI GENERIC - CARDIOLITE
33.0000 | Freq: Once | INTRAVENOUS | Status: AC | PRN
  Administered 2012-11-02: 33 via INTRAVENOUS

## 2012-11-02 NOTE — Progress Notes (Signed)
[facility] SITE 3 NUCLEAR MED [address] [place], [place] [address] [phone]    Cardiology Nuclear Med Study  Christian Morris is a 72 y.o. male     MRN : [id number]     DOB: Jun 15, 1940  Procedure Date: 11/02/2012  Nuclear Med Background Indication for Stress Test:  Evaluation for Ischemia  History:  COPD,ECHO: EF: 55-60 %, '13 MPI: Scar fixed apical defect EF: 57%  Cardiac Risk Factors: Family History - CAD, Hypertension, IDDM Type 2 and Lipids  Symptoms:  Chest Pain and DOE   Nuclear Pre-Procedure Caffeine/Decaff Intake:  None NPO After: 5 pm   Lungs:  clear O2 Sat: 96% on room air. IV 0.9% NS with Angio Cath:  22g  IV Site: R Hand  IV Started by:  Bonnita Levan, RN  Chest Size (in):  52 Cup Size: n/a  Height: 5\' 10"  (1.778 m)  Weight:  290 lb (131.543 kg)  BMI:  Body mass index is 41.61 kg/(m^2). Tech Comments:  CBG 158 mg/dl @ 2:13 am    Nuclear Med Study 1 or 2 day study: 2 day  Stress Test Type:  Eugenie Birks  Reading MD: Charlton Haws, MD  Order Authorizing Provider:  S.Klein MD  Resting Radionuclide: Technetium 67m Sestamibi  Resting Radionuclide Dose: 33.0 mCi on 11/08/12   Stress Radionuclide:  Technetium 23m Sestamibi  Stress Radionuclide Dose: 33.0 mCi on 11/02/12           Stress Protocol Rest HR: 45 Stress HR: 67  Rest BP: 110/63 Stress BP: 118/57  Exercise Time (min): n/a METS: n/a   Predicted Max HR: 146 bpm % Max HR: 45.89 bpm Rate Pressure Product: 7906   Dose of Adenosine (mg):  n/a Dose of Lexiscan: 0.4 mg  Dose of Atropine (mg): n/a Dose of Dobutamine: n/a mcg/kg/min (at max HR)  Stress Test Technologist: Milana Na, EMT-P  Nuclear Technologist:  Doyne Keel, CNMT     Rest Procedure:  Myocardial perfusion imaging was performed at rest 45 minutes following the intravenous administration of Technetium 45m Sestamibi. Rest ECG: NSR - Normal EKG and NSR with non-specific ST-T wave changes  Stress Procedure:  The patient received  IV Lexiscan 0.4 mg over 15-seconds.  Technetium 22m Sestamibi injected at 30-seconds. This patient had sob and chest tightness with the Lexiscan injection. Quantitative spect images were obtained after a 45 minute delay. Stress ECG: No significant change from baseline ECG  QPS Raw Data Images:  Normal; no motion artifact; normal heart/lung ratio. Stress Images:  Decreased apical and inferior uptake Rest Images:  Apical defect Subtraction (SDS):  Mixed infarct and ischemia Transient Ischemic Dilatation (Normal <1.22):  1.26 Lung/Heart Ratio (Normal <0.45):  0.31  Quantitative Gated Spect Images QGS EDV:  178 ml QGS ESV:  85 ml  Impression Exercise Capacity:  Lexiscan with no exercise. BP Response:  Normal blood pressure response. Clinical Symptoms:  There is dyspnea. ECG Impression:  No significant ST segment change suggestive of ischemia. Comparison with Prior Nuclear Study: No images to compare  Overall Impression:  Intermediate risk stress nuclear study Mild inferior wall ischemia fro apex to base with inferoapical wall infarct.  EF low normal but LV appears dilated with diffuse hypokinesis.  LV Ejection Fraction: 52%.  LV Wall Motion:  Low normal EF diffuse hypokinesis   Charlton Haws

## 2012-11-07 ENCOUNTER — Telehealth: Payer: Self-pay | Admitting: Family

## 2012-11-07 ENCOUNTER — Other Ambulatory Visit: Payer: Self-pay | Admitting: Family

## 2012-11-07 NOTE — Telephone Encounter (Signed)
Received call from pt stating pharmacy claims they did not receive metformin rx of 10/28/12. Called rx to [facility] [place], #60 x 4 refills. Notified pt.

## 2012-11-07 NOTE — Telephone Encounter (Signed)
Refill was sent on 10/28/12, #60 x 4 refills. Notified pt, he will contact pharmacy.

## 2012-11-07 NOTE — Telephone Encounter (Signed)
Patient is requesting a refill of metformin to be sent to his pharmacy

## 2012-11-08 ENCOUNTER — Ambulatory Visit (HOSPITAL_COMMUNITY): Payer: Medicare Other | Attending: Internal Medicine

## 2012-11-08 DIAGNOSIS — R0989 Other specified symptoms and signs involving the circulatory and respiratory systems: Secondary | ICD-10-CM

## 2012-11-08 MED ORDER — TECHNETIUM TC 99M SESTAMIBI GENERIC - CARDIOLITE
30.0000 | Freq: Once | INTRAVENOUS | Status: AC | PRN
  Administered 2012-11-08: 30 via INTRAVENOUS

## 2012-11-15 ENCOUNTER — Encounter: Payer: Self-pay | Admitting: *Deleted

## 2012-11-15 ENCOUNTER — Encounter: Payer: Self-pay | Admitting: Cardiovascular Disease

## 2012-11-15 ENCOUNTER — Ambulatory Visit (INDEPENDENT_AMBULATORY_CARE_PROVIDER_SITE_OTHER): Payer: Medicare Other | Admitting: Cardiovascular Disease

## 2012-11-15 VITALS — BP 138/78 | HR 70 | Ht 73.0 in | Wt 298.1 lb

## 2012-11-15 DIAGNOSIS — I10 Essential (primary) hypertension: Secondary | ICD-10-CM

## 2012-11-15 DIAGNOSIS — E785 Hyperlipidemia, unspecified: Secondary | ICD-10-CM

## 2012-11-15 DIAGNOSIS — F17201 Nicotine dependence, unspecified, in remission: Secondary | ICD-10-CM

## 2012-11-15 DIAGNOSIS — Z87891 Personal history of nicotine dependence: Secondary | ICD-10-CM

## 2012-11-15 DIAGNOSIS — R079 Chest pain, unspecified: Secondary | ICD-10-CM

## 2012-11-15 NOTE — Patient Instructions (Signed)
You have a follow up appointment with Dr. Clifton James on December 15, 2012 at 9:00  Your physician has requested that you have a cardiac catheterization. Cardiac catheterization is used to diagnose and/or treat various heart conditions. Doctors may recommend this procedure for a number of different reasons. The most common reason is to evaluate chest pain. Chest pain can be a symptom of coronary artery disease (CAD), and cardiac catheterization can show whether plaque is narrowing or blocking your heart's arteries. This procedure is also used to evaluate the valves, as well as measure the blood flow and oxygen levels in different parts of your heart. For further information please visit [email or web address] Please follow instruction sheet, as given. Scheduled for November 17, 2012  Coronary Angiography Coronary angiography is an X-ray procedure used to look at the arteries in the heart. In this procedure, a dye (contrast dye) is injected through a long, hollow tube (catheter). The catheter is about the size of a piece of cooked spaghetti and is inserted through your groin, wrist, or arm. The dye is injected into each artery, and X-rays are then taken to show if there is a blockage in the arteries of your heart. LET [facility] CARE PROVIDER KNOW ABOUT:  Any allergies you have, including allergies to shellfish or contrast dye.   All medicines you are taking, including vitamins, herbs, eye drops, creams, and over-the-counter medicines.   Previous problems you or members of your family have had with the use of anesthetics.   Any blood disorders you have.   Previous surgeries you have had.  History of kidney problems or failure.   Other medical conditions you have. RISKS AND COMPLICATIONS  Generally, coronary angiography is a safe procedure. However, as with any procedure, complications can occur. Possible complications include:  Allergic reaction to the dye.  Bleeding from the access site or  other locations.  Kidney injury, especially in people with impaired kidney function.  Stroke (rare).  Heart attack (rare). BEFORE THE PROCEDURE   Do not eat or drink anything after midnight the night before the procedure, or as directed by your health care provider.   Ask your health care provider if it is okay to take any needed medicines with a sip of water.  PROCEDURE  You may be given a medicine to help you relax (sedative) before the procedure. This medicine is given through an intravenous (IV) access tube that is inserted into one of your veins.   The area where the catheter will be inserted is washed and shaved. This is usually done in the groin but may be done in the fold of your arm (near your elbow) or in the wrist.   A medicine will be given to numb the area where the catheter will be inserted (local anesthetic).   The health care provider will insert the catheter into an artery. The catheter is guided by using a special type of X-ray (fluoroscopy) of the blood vessel being examined.   A special dye is then injected into the catheter, and X-rays are taken. The dye helps to show where any narrowing or blockages are located in the heart arteries.  AFTER THE PROCEDURE   If the procedure is done through the leg, you will be kept in bed lying flat for several hours. You will be instructed to not bend or cross your legs.  The insertion site will be checked frequently.   The pulse in your feet or wrist will be checked frequently.   Additional blood  tests, X-rays, and an electrocardiogram may be done.   You may need to stay in the hospital overnight for observation.  Document Released: 06/28/2002 Document Revised: 08/24/2012 Document Reviewed: 05/16/2012 [facility] Patient Information 2014 [place], [place].
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
## 2012-11-16 LAB — CBC WITH DIFFERENTIAL/PLATELET
Basophils Absolute: 0.1 10*3/uL (ref 0.0–0.1)
Eosinophils Absolute: 0.2 10*3/uL (ref 0.0–0.7)
Eosinophils Relative: 1.6 % (ref 0.0–5.0)
HCT: 37.1 % — ABNORMAL LOW (ref 39.0–52.0)
Hemoglobin: 12.4 g/dL — ABNORMAL LOW (ref 13.0–17.0)
Lymphocytes Relative: 25.2 % (ref 12.0–46.0)
Lymphs Abs: 2.7 10*3/uL (ref 0.7–4.0)
MCHC: 33.4 g/dL (ref 30.0–36.0)
Monocytes Absolute: 0.6 10*3/uL (ref 0.1–1.0)
Monocytes Relative: 5.4 % (ref 3.0–12.0)
Neutrophils Relative %: 67.2 % (ref 43.0–77.0)
Platelets: 256 10*3/uL (ref 150.0–400.0)
RDW: 13.1 % (ref 11.5–14.6)
WBC: 10.8 10*3/uL — ABNORMAL HIGH (ref 4.5–10.5)

## 2012-11-16 LAB — PROTIME-INR
INR: 1.1 ratio — ABNORMAL HIGH (ref 0.8–1.0)
Prothrombin Time: 12 s (ref 10.2–12.4)

## 2012-11-16 LAB — BASIC METABOLIC PANEL
BUN: 22 mg/dL (ref 6–23)
Calcium: 9.9 mg/dL (ref 8.4–10.5)
Creatinine, Ser: 1.2 mg/dL (ref 0.4–1.5)
GFR: 65.38 mL/min (ref >60.00)
Glucose, Bld: 178 mg/dL — ABNORMAL HIGH (ref 70–99)
Potassium: 4 mEq/L (ref 3.5–5.1)
Sodium: 139 mEq/L (ref 135–145)

## 2012-11-17 ENCOUNTER — Encounter (HOSPITAL_COMMUNITY)
Admission: RE | Disposition: A | Discharge status: Home / Self Care | Payer: Self-pay | Source: Ambulatory Visit | Attending: Cardiovascular Disease

## 2012-11-17 ENCOUNTER — Ambulatory Visit (HOSPITAL_COMMUNITY)
Admission: RE | Admit: 2012-11-17 | Discharge: 2012-11-17 | Disposition: A | Discharge status: Home / Self Care | Payer: Medicare Other | Source: Ambulatory Visit | Attending: Cardiovascular Disease | Admitting: Cardiovascular Disease

## 2012-11-17 DIAGNOSIS — E785 Hyperlipidemia, unspecified: Secondary | ICD-10-CM | POA: Insufficient documentation

## 2012-11-17 DIAGNOSIS — Z9849 Cataract extraction status, unspecified eye: Secondary | ICD-10-CM | POA: Insufficient documentation

## 2012-11-17 DIAGNOSIS — K7689 Other specified diseases of liver: Secondary | ICD-10-CM | POA: Insufficient documentation

## 2012-11-17 DIAGNOSIS — I10 Essential (primary) hypertension: Secondary | ICD-10-CM | POA: Insufficient documentation

## 2012-11-17 DIAGNOSIS — I2510 Atherosclerotic heart disease of native coronary artery without angina pectoris: Secondary | ICD-10-CM

## 2012-11-17 DIAGNOSIS — Z87891 Personal history of nicotine dependence: Secondary | ICD-10-CM | POA: Insufficient documentation

## 2012-11-17 DIAGNOSIS — R079 Chest pain, unspecified: Secondary | ICD-10-CM | POA: Insufficient documentation

## 2012-11-17 DIAGNOSIS — K219 Gastro-esophageal reflux disease without esophagitis: Secondary | ICD-10-CM | POA: Insufficient documentation

## 2012-11-17 DIAGNOSIS — F3289 Other specified depressive episodes: Secondary | ICD-10-CM | POA: Insufficient documentation

## 2012-11-17 DIAGNOSIS — Z85820 Personal history of malignant melanoma of skin: Secondary | ICD-10-CM | POA: Insufficient documentation

## 2012-11-17 DIAGNOSIS — Z8711 Personal history of peptic ulcer disease: Secondary | ICD-10-CM | POA: Insufficient documentation

## 2012-11-17 DIAGNOSIS — Z79899 Other long term (current) drug therapy: Secondary | ICD-10-CM | POA: Insufficient documentation

## 2012-11-17 DIAGNOSIS — F329 Major depressive disorder, single episode, unspecified: Secondary | ICD-10-CM | POA: Insufficient documentation

## 2012-11-17 DIAGNOSIS — E119 Type 2 diabetes mellitus without complications: Secondary | ICD-10-CM | POA: Insufficient documentation

## 2012-11-17 DIAGNOSIS — Z886 Allergy status to analgesic agent status: Secondary | ICD-10-CM | POA: Insufficient documentation

## 2012-11-17 HISTORY — PX: LEFT HEART CATHETERIZATION WITH CORONARY ANGIOGRAM: SHX5451

## 2012-11-17 LAB — GLUCOSE, CAPILLARY
Glucose-Capillary: 148 mg/dL — ABNORMAL HIGH (ref 70–99)
Glucose-Capillary: 99 mg/dL (ref 70–99)

## 2012-11-17 SURGERY — LEFT HEART CATHETERIZATION WITH CORONARY ANGIOGRAM
Anesthesia: LOCAL

## 2012-11-17 MED ORDER — DIAZEPAM 5 MG PO TABS
5.0000 mg | ORAL_TABLET | ORAL | Status: AC
  Administered 2012-11-17: 5 mg via ORAL
  Filled 2012-11-17: qty 1

## 2012-11-17 MED ORDER — NITROGLYCERIN 0.2 MG/ML ON CALL CATH LAB
INTRAVENOUS | Status: AC
  Filled 2012-11-17: qty 1

## 2012-11-17 MED ORDER — SODIUM CHLORIDE 0.9 % IJ SOLN: 3.0000 mL | INTRAMUSCULAR | Status: DC | PRN

## 2012-11-17 MED ORDER — VERAPAMIL HCL 2.5 MG/ML IV SOLN
INTRAVENOUS | Status: AC
  Filled 2012-11-17: qty 2

## 2012-11-17 MED ORDER — SODIUM CHLORIDE 0.9 % IV SOLN: INTRAVENOUS | Status: DC

## 2012-11-17 MED ORDER — SODIUM CHLORIDE 0.9 % IV SOLN
INTRAVENOUS | Status: DC
  Administered 2012-11-17: 08:00:00 via INTRAVENOUS

## 2012-11-17 MED ORDER — ONDANSETRON HCL 4 MG/2ML IJ SOLN: 4.0000 mg | Freq: Four times a day (QID) | INTRAMUSCULAR | Status: DC | PRN

## 2012-11-17 MED ORDER — MIDAZOLAM HCL 2 MG/2ML IJ SOLN
INTRAMUSCULAR | Status: AC
  Filled 2012-11-17: qty 2

## 2012-11-17 MED ORDER — HEPARIN (PORCINE) IN NACL 2-0.9 UNIT/ML-% IJ SOLN
INTRAMUSCULAR | Status: AC
  Filled 2012-11-17: qty 1000

## 2012-11-17 MED ORDER — SODIUM CHLORIDE 0.9 % IV SOLN: 250.0000 mL | INTRAVENOUS | Status: DC | PRN

## 2012-11-17 MED ORDER — ASPIRIN 81 MG PO CHEW
CHEWABLE_TABLET | ORAL | Status: AC
  Administered 2012-11-17: 81 mg via ORAL
  Filled 2012-11-17: qty 1

## 2012-11-17 MED ORDER — HEPARIN SODIUM (PORCINE) 1000 UNIT/ML IJ SOLN
INTRAMUSCULAR | Status: AC
  Filled 2012-11-17: qty 1

## 2012-11-17 MED ORDER — LIDOCAINE HCL (PF) 1 % IJ SOLN
INTRAMUSCULAR | Status: AC
  Filled 2012-11-17: qty 30

## 2012-11-17 MED ORDER — FENTANYL CITRATE 0.05 MG/ML IJ SOLN
INTRAMUSCULAR | Status: AC
  Filled 2012-11-17: qty 2

## 2012-11-17 MED ORDER — ACETAMINOPHEN 325 MG PO TABS: 650.0000 mg | ORAL_TABLET | ORAL | Status: DC | PRN

## 2012-11-17 MED ORDER — ASPIRIN 81 MG PO CHEW
81.0000 mg | CHEWABLE_TABLET | ORAL | Status: AC
  Administered 2012-11-17: 81 mg via ORAL

## 2012-11-17 MED ORDER — SODIUM CHLORIDE 0.9 % IJ SOLN: 3.0000 mL | Freq: Two times a day (BID) | INTRAMUSCULAR | Status: DC

## 2012-11-17 NOTE — Progress Notes (Signed)
Discharge instruction given per MD order.  Pt and CG able to verbalize understanding.  Pt to car via wheelchair. 

## 2012-11-17 NOTE — Progress Notes (Signed)
Received pt from cath procedure alert and able to tolerate food and fluids.  Pt denies any pain at this time.

## 2012-11-17 NOTE — Interval H&P Note (Signed)
History and Physical Interval Note:  11/17/2012 12:12 PM  Christian Morris  has presented today for cardiac cath with the diagnosis of Chest pain, abnormal stress myoview.   The various methods of treatment have been discussed with the patient and family. After consideration of risks, benefits and other options for treatment, the patient has consented to  Procedure(s): LEFT HEART CATHETERIZATION WITH CORONARY ANGIOGRAM (N/A) as a surgical intervention .  The patient's history has been reviewed, patient examined, no change in status, stable for surgery.  I have reviewed the patient's chart and labs.  Questions were answered to the patient's satisfaction.    Cath Lab Visit (complete for each Cath Lab visit)  Clinical Evaluation Leading to the Procedure:   ACS: no  Non-ACS:    Anginal Classification: CCS III  Anti-ischemic medical therapy: No Therapy  Non-Invasive Test Results: Intermediate-risk stress test findings: cardiac mortality 1-3%/year  Prior CABG: No previous CABG         MCALHANY,CHRISTOPHER
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
## 2012-11-19 ENCOUNTER — Other Ambulatory Visit: Payer: Self-pay | Admitting: Family

## 2012-11-28 ENCOUNTER — Telehealth: Payer: Self-pay | Admitting: Family

## 2012-11-28 MED ORDER — HYDROCODONE-ACETAMINOPHEN 7.5-325 MG PO TABS: 1.0000 | ORAL_TABLET | Freq: Four times a day (QID) | ORAL | Status: DC | PRN

## 2012-11-28 NOTE — Telephone Encounter (Signed)
Patient is requesting a new prescription of his pain medication for his back

## 2012-11-28 NOTE — Telephone Encounter (Signed)
Last hydrocodone rx provided 10/26/12, #90 x no refills. Rx printed and forwarded to Provider for review.

## 2012-12-06 ENCOUNTER — Emergency Department (HOSPITAL_COMMUNITY)
Admission: EM | Admit: 2012-12-06 | Discharge: 2012-12-07 | Disposition: A | Discharge status: Home / Self Care | Payer: Medicare Other | Attending: Emergency Medicine | Admitting: Emergency Medicine

## 2012-12-06 DIAGNOSIS — W06XXXA Fall from bed, initial encounter: Secondary | ICD-10-CM | POA: Insufficient documentation

## 2012-12-06 DIAGNOSIS — Z87891 Personal history of nicotine dependence: Secondary | ICD-10-CM | POA: Insufficient documentation

## 2012-12-06 DIAGNOSIS — K219 Gastro-esophageal reflux disease without esophagitis: Secondary | ICD-10-CM | POA: Insufficient documentation

## 2012-12-06 DIAGNOSIS — R51 Headache: Secondary | ICD-10-CM | POA: Insufficient documentation

## 2012-12-06 DIAGNOSIS — E119 Type 2 diabetes mellitus without complications: Secondary | ICD-10-CM | POA: Insufficient documentation

## 2012-12-06 DIAGNOSIS — G8929 Other chronic pain: Secondary | ICD-10-CM | POA: Insufficient documentation

## 2012-12-06 DIAGNOSIS — F3289 Other specified depressive episodes: Secondary | ICD-10-CM | POA: Insufficient documentation

## 2012-12-06 DIAGNOSIS — M47817 Spondylosis without myelopathy or radiculopathy, lumbosacral region: Secondary | ICD-10-CM | POA: Insufficient documentation

## 2012-12-06 DIAGNOSIS — Y92009 Unspecified place in unspecified non-institutional (private) residence as the place of occurrence of the external cause: Secondary | ICD-10-CM | POA: Insufficient documentation

## 2012-12-06 DIAGNOSIS — K7689 Other specified diseases of liver: Secondary | ICD-10-CM | POA: Insufficient documentation

## 2012-12-06 DIAGNOSIS — W19XXXA Unspecified fall, initial encounter: Secondary | ICD-10-CM

## 2012-12-06 DIAGNOSIS — I10 Essential (primary) hypertension: Secondary | ICD-10-CM | POA: Insufficient documentation

## 2012-12-06 DIAGNOSIS — R5381 Other malaise: Secondary | ICD-10-CM | POA: Insufficient documentation

## 2012-12-06 DIAGNOSIS — Z794 Long term (current) use of insulin: Secondary | ICD-10-CM | POA: Insufficient documentation

## 2012-12-06 DIAGNOSIS — M47816 Spondylosis without myelopathy or radiculopathy, lumbar region: Secondary | ICD-10-CM

## 2012-12-06 DIAGNOSIS — Z79899 Other long term (current) drug therapy: Secondary | ICD-10-CM | POA: Insufficient documentation

## 2012-12-06 DIAGNOSIS — Z85828 Personal history of other malignant neoplasm of skin: Secondary | ICD-10-CM | POA: Insufficient documentation

## 2012-12-06 DIAGNOSIS — M545 Low back pain: Secondary | ICD-10-CM

## 2012-12-06 DIAGNOSIS — Z8711 Personal history of peptic ulcer disease: Secondary | ICD-10-CM | POA: Insufficient documentation

## 2012-12-06 DIAGNOSIS — F329 Major depressive disorder, single episode, unspecified: Secondary | ICD-10-CM | POA: Insufficient documentation

## 2012-12-06 DIAGNOSIS — Z7982 Long term (current) use of aspirin: Secondary | ICD-10-CM | POA: Insufficient documentation

## 2012-12-06 DIAGNOSIS — Y9389 Activity, other specified: Secondary | ICD-10-CM | POA: Insufficient documentation

## 2012-12-06 DIAGNOSIS — E785 Hyperlipidemia, unspecified: Secondary | ICD-10-CM | POA: Insufficient documentation

## 2012-12-06 NOTE — ED Notes (Signed)
Bed: ZO10 Expected date:  Expected time:  Means of arrival:  Comments: EMS 72yo Gen Weakness, lower back pain, hx of same

## 2012-12-06 NOTE — ED Notes (Signed)
Pt has had back pain for three years and states it is getting more difficult to walk,  Pt didn't fall he sat down on ground to get something up but he couldn't get up without help

## 2012-12-07 ENCOUNTER — Emergency Department (HOSPITAL_COMMUNITY): Payer: Medicare Other

## 2012-12-07 LAB — CBC WITH DIFFERENTIAL/PLATELET
Basophils Absolute: 0.1 10*3/uL (ref 0.0–0.1)
Basophils Relative: 0 % (ref 0–1)
Eosinophils Relative: 0 % (ref 0–5)
HCT: 38.9 % — ABNORMAL LOW (ref 39.0–52.0)
Hemoglobin: 12.9 g/dL — ABNORMAL LOW (ref 13.0–17.0)
Lymphocytes Relative: 5 % — ABNORMAL LOW (ref 12–46)
Lymphs Abs: 0.6 10*3/uL — ABNORMAL LOW (ref 0.7–4.0)
MCH: 29.1 pg (ref 26.0–34.0)
MCHC: 33.2 g/dL (ref 30.0–36.0)
MCV: 87.6 fL (ref 78.0–100.0)
Monocytes Absolute: 1.3 10*3/uL — ABNORMAL HIGH (ref 0.1–1.0)
Monocytes Relative: 11 % (ref 3–12)
Neutrophils Relative %: 83 % — ABNORMAL HIGH (ref 43–77)
RBC: 4.44 MIL/uL (ref 4.22–5.81)
RDW: 13.7 % (ref 11.5–15.5)

## 2012-12-07 LAB — COMPREHENSIVE METABOLIC PANEL
AST: 38 U/L — ABNORMAL HIGH (ref 0–37)
Albumin: 4.4 g/dL (ref 3.5–5.2)
BUN: 31 mg/dL — ABNORMAL HIGH (ref 6–23)
CO2: 24 mEq/L (ref 19–32)
Calcium: 9.8 mg/dL (ref 8.4–10.5)
Creatinine, Ser: 1.07 mg/dL (ref 0.50–1.35)
GFR calc non Af Amer: 66 mL/min — ABNORMAL LOW (ref >90)
Total Bilirubin: 0.5 mg/dL (ref 0.3–1.2)

## 2012-12-07 LAB — URINALYSIS, ROUTINE W REFLEX MICROSCOPIC
Glucose, UA: 250 mg/dL — AB
Leukocytes, UA: NEGATIVE
Protein, ur: 100 mg/dL — AB
Specific Gravity, Urine: 1.027 (ref 1.005–1.030)
Urobilinogen, UA: 1 mg/dL (ref 0.0–1.0)
pH: 6 (ref 5.0–8.0)

## 2012-12-07 LAB — URINE MICROSCOPIC-ADD ON

## 2012-12-07 LAB — TROPONIN I: Troponin I: <0.3 ng/mL (ref <0.30)

## 2012-12-07 MED ORDER — HYDROCODONE-ACETAMINOPHEN 5-325 MG PO TABS
2.0000 | ORAL_TABLET | Freq: Once | ORAL | Status: AC
  Administered 2012-12-07: 2 via ORAL
  Filled 2012-12-07: qty 2

## 2012-12-07 NOTE — ED Notes (Signed)
Patient transported to X-ray 

## 2012-12-07 NOTE — ED Provider Notes (Signed)
CSN: [id number]     Arrival date & time 12/06/12  2319 History   First MD Initiated Contact with Patient 12/06/12 2323     Chief Complaint  Patient presents with  . Weakness  . Back Pain  . Headache   (Consider location/radiation/quality/duration/timing/severity/associated sxs/prior Treatment) HPI 72 year old male presents emergency department from home via EMS with multiple complaints.  He is overall poor historian, and is given different stories to EMS, nursing, staff, and myself.  Patient reports to me that he is here for chest pain, belly pain, headache and back pain.  Patient has history of chronic back pain ongoing since he was in the second grade and he fell on a slide.  He has been evaluated in the 70s by a back surgeon, who recommended surgery, but did not feel that he may improve completely.  Patient is prescribed Vicodin by his primary care Dr.  He reports he takes it in the morning, and it helps with his back pain.  Patient reports he has back pain daily for many years.  Patient also complains of chest pain, and abdominal pain.  He reports he has this on a daily basis, also ongoing for some time.  The abdominal pain startsat his umbilicus and radiates up into his chest.  He has had this pain all day today, all day yesterday, and on the day before that.  Patient also complaining of right-sided headache.  He reports he's had headaches daily for some time, worse over last 3 days.  No fever no chills.  No neck stiffness.  No head trauma.  He reports headache usually improves after taking his Vicodin in the morning.  EMS was called out to his house tonight due to a fall.  Patient reports he was asleep in bed and accidentally rolled out, landing on the floor on his back.  He denies striking his head, denies any LOC.  He reports that he was unable to get up off the floor due to pain in his back.  His grandson and friends helped him back up off the floor to sitting on the bed.  He did not take any  pain medicine after his fall.  He denies any bowel or bladder incontinence.  No change in his chronic nature of his pain.  No weakness or numbness of his legs.  Patient reports he is generally weak, but this is been ongoing for several weeks to months.  He denies any nausea or vomiting.  No urinary symptoms.  He reports he is having normal bowel movements.  Past Medical History  Diagnosis Date  . Arthritis   . Depression   . Diabetes mellitus   . Hypertension   . Hyperlipidemia   . History of gastric ulcer     Age 56- no recurrence since.  . Transfusion history     due to bleeding ulcer at age 89  . Hypogonadism male 01/11/2011  . Squamous cell carcinoma in situ of skin of forearm 01/23/2011  . Lung nodule     right 16 mm, seen initially 6/12. 13mm in 08/2011.  [name] GERD (gastroesophageal reflux disease)   . Headache(784.0)   . Fatty liver disease, nonalcoholic    Past Surgical History  Procedure Laterality Date  . Eye surgery  08/2009    cataract removal left eye--Dr Dione Booze  . Melanoma excision      left forearm   Family History  Problem Relation Age of Onset  . Arthritis Mother   . Heart disease  Sister     Massive MI age 32.  . Arrhythmia Brother   . Melanoma Son   . Heart attack Brother    History  Substance Use Topics  . Smoking status: Former Smoker -- 1.00 packs/day for 25 years    Types: Cigarettes    Quit date: 01/05/1982  . Smokeless tobacco: Never Used  . Alcohol Use: No    Review of Systems  See History of Present Illness; otherwise all other systems are reviewed and negative  Allergies  Dust mite extract and Other  Home Medications   Current Outpatient Rx  Name  Route  Sig  Dispense  Refill  . aspirin EC 81 MG tablet   Oral   Take 81 mg by mouth daily.         [name] atorvastatin (LIPITOR) 40 MG tablet   Oral   Take 1 tablet (40 mg total) by mouth daily.   30 tablet   3   . diazepam (VALIUM) 10 MG tablet   Oral   Take 1 tablet (10 mg total) by mouth  at bedtime as needed for anxiety.   30 tablet   0   . diltiazem (CARDIZEM CD) 180 MG 24 hr capsule   Oral   Take 1 capsule (180 mg total) by mouth daily.   30 capsule   3   . fish oil-omega-3 fatty acids 1000 MG capsule   Oral   Take 2 g by mouth 2 (two) times daily.           . hydrochlorothiazide (HYDRODIURIL) 25 MG tablet   Oral   Take 1 tablet (25 mg total) by mouth daily.   30 tablet   5   . HYDROcodone-acetaminophen (NORCO) 7.5-325 MG per tablet   Oral   Take 1 tablet by mouth every 6 (six) hours as needed.   90 tablet   0   . insulin glargine (LANTUS) 100 units/mL SOLN   Subcutaneous   Inject 20 Units into the skin at bedtime.          [name] losartan (COZAAR) 100 MG tablet   Oral   Take 100 mg by mouth daily.         [name] omeprazole (PRILOSEC) 20 MG capsule   Oral   Take 1 capsule (20 mg total) by mouth daily.         [name] PARoxetine (PAXIL) 30 MG tablet      TAKE ONE TABLET BY MOUTH ONCE DAILY   30 tablet   2   . pioglitazone (ACTOS) 30 MG tablet   Oral   Take 1 tablet (30 mg total) by mouth daily.   30 tablet   3   . tamsulosin (FLOMAX) 0.4 MG CAPS   Oral   Take 1 capsule (0.4 mg total) by mouth daily.   30 capsule   3   . nitroGLYCERIN (NITROSTAT) 0.4 MG SL tablet   Sublingual   Place 1 tablet (0.4 mg total) under the tongue every 5 (five) minutes x 3 doses as needed for chest pain (donot take with viagra).   60 tablet   12    BP 127/47  Pulse 86  Temp(Src) 99.6 F (37.6 C) (Oral)  Resp 20  SpO2 96% Physical Exam  Nursing note and vitals reviewed. Constitutional: He is oriented to person, place, and time. He appears well-developed and well-nourished. No distress.  HENT:  Head: Normocephalic and atraumatic.  Right Ear: External ear normal.  Left Ear: External  ear normal.  Nose: Nose normal.  Mouth/Throat: Oropharynx is clear and moist.  Eyes: Conjunctivae and EOM are normal. Pupils are equal, round, and reactive to light.  Neck:  Normal range of motion. Neck supple. No JVD present. No tracheal deviation present. No thyromegaly present.  Cardiovascular: Normal rate, regular rhythm, normal heart sounds and intact distal pulses.  Exam reveals no gallop and no friction rub.   No murmur heard. Pulmonary/Chest: Effort normal and breath sounds normal. No stridor. No respiratory distress. He has no wheezes. He has no rales. He exhibits no tenderness.  Abdominal: Soft. Bowel sounds are normal. He exhibits no distension and no mass. There is no tenderness. There is no rebound and no guarding.  Musculoskeletal: Normal range of motion. He exhibits tenderness (patient has tenderness to left lower paraspinal region, and left SI joint.). He exhibits no edema.  Lymphadenopathy:    He has no cervical adenopathy.  Neurological: He is alert and oriented to person, place, and time. He has normal reflexes. No cranial nerve deficit. He exhibits normal muscle tone. Coordination normal.  Skin: Skin is warm and dry. No rash noted. No erythema. No pallor.  Psychiatric: He has a normal mood and affect. His behavior is normal. Judgment and thought content normal.    ED Course  Procedures (including critical care time) Labs Review Labs Reviewed  GLUCOSE, CAPILLARY - Abnormal; Notable for the following:    Glucose-Capillary 215 (*)    All other components within normal limits  CBC WITH DIFFERENTIAL - Abnormal; Notable for the following:    WBC 12.1 (*)    Hemoglobin 12.9 (*)    HCT 38.9 (*)    Neutrophils Relative % 83 (*)    Neutro Abs 10.1 (*)    Lymphocytes Relative 5 (*)    Lymphs Abs 0.6 (*)    Monocytes Absolute 1.3 (*)    All other components within normal limits  COMPREHENSIVE METABOLIC PANEL - Abnormal; Notable for the following:    Sodium 132 (*)    Chloride 92 (*)    Glucose, Bld 212 (*)    BUN 31 (*)    AST 38 (*)    GFR calc non Af Amer 66 (*)    GFR calc Af Amer 77 (*)    All other components within normal limits   URINALYSIS, ROUTINE W REFLEX MICROSCOPIC - Abnormal; Notable for the following:    Glucose, UA 250 (*)    Hgb urine dipstick LARGE (*)    Protein, ur 100 (*)    All other components within normal limits  TROPONIN I  URINE MICROSCOPIC-ADD ON   Imaging Review Dg Lumbar Spine Complete  12/07/2012   CLINICAL DATA:  Lower back pain  EXAM: LUMBAR SPINE - COMPLETE 4+ VIEW  COMPARISON:  10/24/2012 CT  FINDINGS: Multilevel degenerative changes, most severe at L4-5 and L5-S1. There is grade 1 anterolisthesis of L5 on S1 and L5 pars defects are noted. Lower thoracic/ thoracolumbar degenerative changes are again noted. There is mild wedge deformity of the T11 vertebral body anteriorly, remote. No acute fracture. No dislocation. Atherosclerotic vascular calcifications. Overlying soft tissues otherwise unremarkable.  IMPRESSION: Multilevel degenerative changes, most severe at L5-S1.  L5 pars defects and grade 1 anterolisthesis of L5 on S1.  No acute osseous finding.   Electronically Signed   By: Jearld Lesch M.D.   On: 12/07/2012 00:57   Ct Head Wo Contrast  12/07/2012   CLINICAL DATA:  Weakness, headache  EXAM: CT HEAD  WITHOUT CONTRAST  CT CERVICAL SPINE WITHOUT CONTRAST  TECHNIQUE: Multidetector CT imaging of the head and cervical spine was performed following the standard protocol without intravenous contrast. Multiplanar CT image reconstructions of the cervical spine were also generated.  COMPARISON:  None available  FINDINGS: CT HEAD FINDINGS  Diffuse prominence of the CSF containing spaces is compatible with moderately age-related atrophy. Scattered and confluent hypodensity within the periventricular and deep white matter is most consistent with chronic small vessel ischemic change.  No acute large vessel territory infarct identified. There is no acute intracranial hemorrhage. No mass or midline shift. Gray-white matter differentiation is preserved. No extra-axial fluid collection.  Calvarium is intact.  Orbital soft tissues are within normal limits. Paranasal sinuses and mastoid air cells are clear.  CT CERVICAL SPINE FINDINGS  There is slight reversal of the normal cervical lordosis. Trace anterolisthesis of C3 and C4 is present. Vertebral body heights are preserved. Normal C1-2 articulations are intact. There is no definite prevertebral soft tissue swelling.  Multilevel degenerative disc disease as evidenced by intervertebral disk space narrowing, endplate sclerosis, and endplate osteophytosis is seen, most severe at C5-6.  A 1.0 x 0.9 cm sclerotic focus within the right aspect of the C2 vertebral body is noted, of uncertain clinical significance. No other focal osseous lesions identified.  Visualized soft tissues of the neck are within normal limits. No definite apical pneumothorax.  IMPRESSION: CT BRAIN:  1. No acute intracranial process identified. 2. Mild age-related atrophy and chronic microvascular ischemic changes.  CT CERVICAL SPINE:  1. No acute fracture or listhesis identified within the cervical spine. 2. Multilevel degenerative disc disease, most severe at C5-6. 3. Sclerotic lesion within the right aspect of the C2 vertebral body. A benign bone island is favored. This finding is of doubtful clinical significance.   Electronically Signed   By: Rise Mu M.D.   On: 12/07/2012 00:55   Ct Cervical Spine Wo Contrast  12/07/2012   CLINICAL DATA:  Weakness, headache  EXAM: CT HEAD WITHOUT CONTRAST  CT CERVICAL SPINE WITHOUT CONTRAST  TECHNIQUE: Multidetector CT imaging of the head and cervical spine was performed following the standard protocol without intravenous contrast. Multiplanar CT image reconstructions of the cervical spine were also generated.  COMPARISON:  None available  FINDINGS: CT HEAD FINDINGS  Diffuse prominence of the CSF containing spaces is compatible with moderately age-related atrophy. Scattered and confluent hypodensity within the periventricular and deep white matter is  most consistent with chronic small vessel ischemic change.  No acute large vessel territory infarct identified. There is no acute intracranial hemorrhage. No mass or midline shift. Gray-white matter differentiation is preserved. No extra-axial fluid collection.  Calvarium is intact. Orbital soft tissues are within normal limits. Paranasal sinuses and mastoid air cells are clear.  CT CERVICAL SPINE FINDINGS  There is slight reversal of the normal cervical lordosis. Trace anterolisthesis of C3 and C4 is present. Vertebral body heights are preserved. Normal C1-2 articulations are intact. There is no definite prevertebral soft tissue swelling.  Multilevel degenerative disc disease as evidenced by intervertebral disk space narrowing, endplate sclerosis, and endplate osteophytosis is seen, most severe at C5-6.  A 1.0 x 0.9 cm sclerotic focus within the right aspect of the C2 vertebral body is noted, of uncertain clinical significance. No other focal osseous lesions identified.  Visualized soft tissues of the neck are within normal limits. No definite apical pneumothorax.  IMPRESSION: CT BRAIN:  1. No acute intracranial process identified. 2. Mild age-related atrophy and  chronic microvascular ischemic changes.  CT CERVICAL SPINE:  1. No acute fracture or listhesis identified within the cervical spine. 2. Multilevel degenerative disc disease, most severe at C5-6. 3. Sclerotic lesion within the right aspect of the C2 vertebral body. A benign bone island is favored. This finding is of doubtful clinical significance.   Electronically Signed   By: Rise Mu M.D.   On: 12/07/2012 00:55    EKG Interpretation    Date/Time:  Tuesday December 06 2012 23:34:57 EST Ventricular Rate:  90 PR Interval:  199 QRS Duration: 117 QT Interval:  384 QTC Calculation: 470 R Axis:   -31 Text Interpretation:  Sinus rhythm Nonspecific intraventricular conduction delay Since last tracing rate faster Confirmed by Taetum Flewellen  MD, Aslee Such  ([id number]) on 12/07/2012 1:10:17 AM            MDM   1. Fall at home, initial encounter   2. Degenerative arthritis of lumbar spine   3. LOW BACK PAIN, CHRONIC   4. DIABETES MELLITUS, TYPE II   5. Headache    72 year old male with multiple complaints, also with fall out of bed.  Workup here unremarkable.  His exam does show some low back pain on palpation.  He has normal sensation and movement of his lower extremities.  Patient has been given Vicodin here.  Will ambulate with assistance.  Plan for discharge to home.  If he is able to move a little better.    Olivia Mackie, MD 12/07/12 [phone]

## 2012-12-09 ENCOUNTER — Inpatient Hospital Stay (HOSPITAL_COMMUNITY)
Admission: EM | Admit: 2012-12-09 | Discharge: 2012-12-12 | DRG: 871 | Disposition: A | Discharge status: Home / Self Care | Payer: Medicare Other | Attending: Internal Medicine | Admitting: Internal Medicine

## 2012-12-09 ENCOUNTER — Encounter (HOSPITAL_COMMUNITY): Payer: Self-pay | Admitting: Emergency Medicine

## 2012-12-09 ENCOUNTER — Emergency Department (HOSPITAL_COMMUNITY): Payer: Medicare Other

## 2012-12-09 DIAGNOSIS — I2510 Atherosclerotic heart disease of native coronary artery without angina pectoris: Secondary | ICD-10-CM | POA: Diagnosis present

## 2012-12-09 DIAGNOSIS — G4733 Obstructive sleep apnea (adult) (pediatric): Secondary | ICD-10-CM

## 2012-12-09 DIAGNOSIS — F341 Dysthymic disorder: Secondary | ICD-10-CM

## 2012-12-09 DIAGNOSIS — J4489 Other specified chronic obstructive pulmonary disease: Secondary | ICD-10-CM

## 2012-12-09 DIAGNOSIS — M545 Low back pain, unspecified: Secondary | ICD-10-CM

## 2012-12-09 DIAGNOSIS — N529 Male erectile dysfunction, unspecified: Secondary | ICD-10-CM

## 2012-12-09 DIAGNOSIS — Z860100 Personal history of colon polyps, unspecified: Secondary | ICD-10-CM

## 2012-12-09 DIAGNOSIS — Z7982 Long term (current) use of aspirin: Secondary | ICD-10-CM

## 2012-12-09 DIAGNOSIS — K7689 Other specified diseases of liver: Secondary | ICD-10-CM

## 2012-12-09 DIAGNOSIS — E291 Testicular hypofunction: Secondary | ICD-10-CM

## 2012-12-09 DIAGNOSIS — IMO0002 Reserved for concepts with insufficient information to code with codable children: Secondary | ICD-10-CM

## 2012-12-09 DIAGNOSIS — M1710 Unilateral primary osteoarthritis, unspecified knee: Secondary | ICD-10-CM

## 2012-12-09 DIAGNOSIS — N400 Enlarged prostate without lower urinary tract symptoms: Secondary | ICD-10-CM

## 2012-12-09 DIAGNOSIS — A419 Sepsis, unspecified organism: Secondary | ICD-10-CM | POA: Diagnosis present

## 2012-12-09 DIAGNOSIS — Z8711 Personal history of peptic ulcer disease: Secondary | ICD-10-CM

## 2012-12-09 DIAGNOSIS — Z6837 Body mass index (BMI) 37.0-37.9, adult: Secondary | ICD-10-CM

## 2012-12-09 DIAGNOSIS — J189 Pneumonia, unspecified organism: Secondary | ICD-10-CM

## 2012-12-09 DIAGNOSIS — M1990 Unspecified osteoarthritis, unspecified site: Secondary | ICD-10-CM | POA: Diagnosis present

## 2012-12-09 DIAGNOSIS — Z9109 Other allergy status, other than to drugs and biological substances: Secondary | ICD-10-CM

## 2012-12-09 DIAGNOSIS — K219 Gastro-esophageal reflux disease without esophagitis: Secondary | ICD-10-CM

## 2012-12-09 DIAGNOSIS — Z794 Long term (current) use of insulin: Secondary | ICD-10-CM

## 2012-12-09 DIAGNOSIS — M722 Plantar fascial fibromatosis: Secondary | ICD-10-CM

## 2012-12-09 DIAGNOSIS — R0789 Other chest pain: Secondary | ICD-10-CM

## 2012-12-09 DIAGNOSIS — E785 Hyperlipidemia, unspecified: Secondary | ICD-10-CM

## 2012-12-09 DIAGNOSIS — E119 Type 2 diabetes mellitus without complications: Secondary | ICD-10-CM

## 2012-12-09 DIAGNOSIS — N200 Calculus of kidney: Secondary | ICD-10-CM

## 2012-12-09 DIAGNOSIS — F419 Anxiety disorder, unspecified: Secondary | ICD-10-CM

## 2012-12-09 DIAGNOSIS — J449 Chronic obstructive pulmonary disease, unspecified: Secondary | ICD-10-CM | POA: Diagnosis present

## 2012-12-09 DIAGNOSIS — R6510 Systemic inflammatory response syndrome (SIRS) of non-infectious origin without acute organ dysfunction: Secondary | ICD-10-CM

## 2012-12-09 DIAGNOSIS — I129 Hypertensive chronic kidney disease with stage 1 through stage 4 chronic kidney disease, or unspecified chronic kidney disease: Secondary | ICD-10-CM | POA: Diagnosis present

## 2012-12-09 DIAGNOSIS — R809 Proteinuria, unspecified: Secondary | ICD-10-CM

## 2012-12-09 DIAGNOSIS — N182 Chronic kidney disease, stage 2 (mild): Secondary | ICD-10-CM | POA: Diagnosis present

## 2012-12-09 DIAGNOSIS — N179 Acute kidney failure, unspecified: Secondary | ICD-10-CM | POA: Diagnosis present

## 2012-12-09 DIAGNOSIS — B353 Tinea pedis: Secondary | ICD-10-CM

## 2012-12-09 DIAGNOSIS — Z87891 Personal history of nicotine dependence: Secondary | ICD-10-CM

## 2012-12-09 DIAGNOSIS — I10 Essential (primary) hypertension: Secondary | ICD-10-CM

## 2012-12-09 DIAGNOSIS — N289 Disorder of kidney and ureter, unspecified: Secondary | ICD-10-CM

## 2012-12-09 LAB — BASIC METABOLIC PANEL
CO2: 24 mEq/L (ref 19–32)
Chloride: 95 mEq/L — ABNORMAL LOW (ref 96–112)
Creatinine, Ser: 1.29 mg/dL (ref 0.50–1.35)
GFR calc non Af Amer: 53 mL/min — ABNORMAL LOW (ref >90)
Glucose, Bld: 190 mg/dL — ABNORMAL HIGH (ref 70–99)
Sodium: 136 mEq/L (ref 135–145)

## 2012-12-09 LAB — URINALYSIS, ROUTINE W REFLEX MICROSCOPIC
Bilirubin Urine: NEGATIVE
Nitrite: NEGATIVE
Specific Gravity, Urine: 1.019 (ref 1.005–1.030)
pH: 5 (ref 5.0–8.0)

## 2012-12-09 LAB — URINE MICROSCOPIC-ADD ON

## 2012-12-09 LAB — GLUCOSE, CAPILLARY

## 2012-12-09 LAB — CBC
HCT: 40.6 % (ref 39.0–52.0)
MCH: 29.7 pg (ref 26.0–34.0)
MCV: 87.3 fL (ref 78.0–100.0)
RBC: 4.65 MIL/uL (ref 4.22–5.81)
WBC: 11.9 10*3/uL — ABNORMAL HIGH (ref 4.0–10.5)

## 2012-12-09 LAB — TROPONIN I: Troponin I: <0.3 ng/mL (ref <0.30)

## 2012-12-09 MED ORDER — IPRATROPIUM BROMIDE 0.02 % IN SOLN
0.5000 mg | RESPIRATORY_TRACT | Status: DC
  Administered 2012-12-09 – 2012-12-10 (×7): 0.5 mg via RESPIRATORY_TRACT
  Filled 2012-12-09 (×6): qty 2.5

## 2012-12-09 MED ORDER — DEXTROSE 5 % IV SOLN
1.0000 g | Freq: Three times a day (TID) | INTRAVENOUS | Status: DC
  Administered 2012-12-10 – 2012-12-12 (×7): 1 g via INTRAVENOUS
  Filled 2012-12-09 (×10): qty 1

## 2012-12-09 MED ORDER — INSULIN ASPART 100 UNIT/ML ~~LOC~~ SOLN
0.0000 [IU] | Freq: Three times a day (TID) | SUBCUTANEOUS | Status: DC
  Administered 2012-12-10: 3 [IU] via SUBCUTANEOUS
  Administered 2012-12-10: 5 [IU] via SUBCUTANEOUS
  Administered 2012-12-10: 3 [IU] via SUBCUTANEOUS
  Administered 2012-12-11: 2 [IU] via SUBCUTANEOUS
  Administered 2012-12-11 – 2012-12-12 (×2): 3 [IU] via SUBCUTANEOUS
  Administered 2012-12-12: 2 [IU] via SUBCUTANEOUS

## 2012-12-09 MED ORDER — OMEGA-3 FATTY ACIDS 1000 MG PO CAPS: 2.0000 g | ORAL_CAPSULE | Freq: Two times a day (BID) | ORAL | Status: DC

## 2012-12-09 MED ORDER — NITROGLYCERIN 0.4 MG SL SUBL: 0.4000 mg | SUBLINGUAL_TABLET | SUBLINGUAL | Status: DC | PRN

## 2012-12-09 MED ORDER — LOSARTAN POTASSIUM 50 MG PO TABS
100.0000 mg | ORAL_TABLET | Freq: Every day | ORAL | Status: DC
  Administered 2012-12-10: 100 mg via ORAL
  Filled 2012-12-09 (×2): qty 2

## 2012-12-09 MED ORDER — ATORVASTATIN CALCIUM 40 MG PO TABS
40.0000 mg | ORAL_TABLET | Freq: Every day | ORAL | Status: DC
  Administered 2012-12-10 – 2012-12-12 (×3): 40 mg via ORAL
  Filled 2012-12-09 (×3): qty 1

## 2012-12-09 MED ORDER — PAROXETINE HCL 30 MG PO TABS
30.0000 mg | ORAL_TABLET | Freq: Every day | ORAL | Status: DC
  Administered 2012-12-10 – 2012-12-12 (×3): 30 mg via ORAL
  Filled 2012-12-09 (×3): qty 1

## 2012-12-09 MED ORDER — INSULIN GLARGINE 100 UNIT/ML ~~LOC~~ SOLN
16.0000 [IU] | Freq: Every day | SUBCUTANEOUS | Status: DC
  Administered 2012-12-09 – 2012-12-11 (×3): 16 [IU] via SUBCUTANEOUS
  Filled 2012-12-09 (×5): qty 0.16

## 2012-12-09 MED ORDER — SODIUM CHLORIDE 0.9 % IV SOLN: 1000.0000 mL | INTRAVENOUS | Status: DC

## 2012-12-09 MED ORDER — ALBUTEROL SULFATE (5 MG/ML) 0.5% IN NEBU
2.5000 mg | INHALATION_SOLUTION | RESPIRATORY_TRACT | Status: DC
  Administered 2012-12-09 – 2012-12-10 (×5): 2.5 mg via RESPIRATORY_TRACT
  Administered 2012-12-10: 5 mg via RESPIRATORY_TRACT
  Administered 2012-12-10: 2.5 mg via RESPIRATORY_TRACT
  Filled 2012-12-09 (×6): qty 0.5

## 2012-12-09 MED ORDER — ACETAMINOPHEN 325 MG PO TABS
650.0000 mg | ORAL_TABLET | Freq: Once | ORAL | Status: AC
  Administered 2012-12-09: 650 mg via ORAL
  Filled 2012-12-09: qty 2

## 2012-12-09 MED ORDER — OMEGA-3-ACID ETHYL ESTERS 1 G PO CAPS
2.0000 g | ORAL_CAPSULE | Freq: Two times a day (BID) | ORAL | Status: DC
  Administered 2012-12-09 – 2012-12-12 (×6): 2 g via ORAL
  Filled 2012-12-09 (×7): qty 2

## 2012-12-09 MED ORDER — TAMSULOSIN HCL 0.4 MG PO CAPS
0.4000 mg | ORAL_CAPSULE | Freq: Every day | ORAL | Status: DC
  Administered 2012-12-10 – 2012-12-12 (×3): 0.4 mg via ORAL
  Filled 2012-12-09 (×3): qty 1

## 2012-12-09 MED ORDER — DILTIAZEM HCL ER COATED BEADS 180 MG PO CP24
180.0000 mg | ORAL_CAPSULE | Freq: Every day | ORAL | Status: DC
  Administered 2012-12-10 – 2012-12-12 (×3): 180 mg via ORAL
  Filled 2012-12-09 (×3): qty 1

## 2012-12-09 MED ORDER — OSELTAMIVIR PHOSPHATE 75 MG PO CAPS
75.0000 mg | ORAL_CAPSULE | Freq: Two times a day (BID) | ORAL | Status: DC
  Administered 2012-12-09 – 2012-12-12 (×6): 75 mg via ORAL
  Filled 2012-12-09 (×7): qty 1

## 2012-12-09 MED ORDER — VANCOMYCIN HCL 10 G IV SOLR
1250.0000 mg | Freq: Two times a day (BID) | INTRAVENOUS | Status: DC
  Filled 2012-12-09: qty 1250

## 2012-12-09 MED ORDER — ASPIRIN EC 81 MG PO TBEC
81.0000 mg | DELAYED_RELEASE_TABLET | Freq: Every day | ORAL | Status: DC
  Administered 2012-12-10 – 2012-12-12 (×3): 81 mg via ORAL
  Filled 2012-12-09 (×3): qty 1

## 2012-12-09 MED ORDER — VANCOMYCIN HCL 10 G IV SOLR
1750.0000 mg | INTRAVENOUS | Status: DC
  Administered 2012-12-10 – 2012-12-11 (×2): 1750 mg via INTRAVENOUS
  Filled 2012-12-09 (×3): qty 1750

## 2012-12-09 MED ORDER — ENOXAPARIN SODIUM 40 MG/0.4ML ~~LOC~~ SOLN
40.0000 mg | SUBCUTANEOUS | Status: DC
  Administered 2012-12-09 – 2012-12-11 (×3): 40 mg via SUBCUTANEOUS
  Filled 2012-12-09 (×4): qty 0.4

## 2012-12-09 MED ORDER — DEXTROSE 5 % IV SOLN: 1.0000 g | Freq: Three times a day (TID) | INTRAVENOUS | Status: DC

## 2012-12-09 MED ORDER — DIAZEPAM 5 MG PO TABS
10.0000 mg | ORAL_TABLET | Freq: Every evening | ORAL | Status: DC | PRN
  Administered 2012-12-11: 10 mg via ORAL
  Filled 2012-12-09: qty 2

## 2012-12-09 MED ORDER — ACETAMINOPHEN 325 MG PO TABS
650.0000 mg | ORAL_TABLET | Freq: Four times a day (QID) | ORAL | Status: DC | PRN
  Administered 2012-12-09: 650 mg via ORAL
  Filled 2012-12-09: qty 2

## 2012-12-09 MED ORDER — HYDROCODONE-ACETAMINOPHEN 7.5-325 MG PO TABS
1.0000 | ORAL_TABLET | Freq: Four times a day (QID) | ORAL | Status: DC | PRN
  Administered 2012-12-11 – 2012-12-12 (×2): 1 via ORAL
  Filled 2012-12-09 (×2): qty 1

## 2012-12-09 MED ORDER — HYDROCHLOROTHIAZIDE 25 MG PO TABS
25.0000 mg | ORAL_TABLET | Freq: Every day | ORAL | Status: DC
  Administered 2012-12-10 – 2012-12-12 (×3): 25 mg via ORAL
  Filled 2012-12-09 (×3): qty 1

## 2012-12-09 MED ORDER — PANTOPRAZOLE SODIUM 40 MG PO TBEC
40.0000 mg | DELAYED_RELEASE_TABLET | Freq: Every day | ORAL | Status: DC
  Administered 2012-12-10 – 2012-12-12 (×3): 40 mg via ORAL
  Filled 2012-12-09 (×3): qty 1

## 2012-12-09 MED ORDER — DEXTROSE 5 % IV SOLN
1.0000 g | Freq: Once | INTRAVENOUS | Status: AC
  Administered 2012-12-09: 1 g via INTRAVENOUS
  Filled 2012-12-09 (×2): qty 1

## 2012-12-09 MED ORDER — SODIUM CHLORIDE 0.9 % IV SOLN
2500.0000 mg | Freq: Once | INTRAVENOUS | Status: AC
  Administered 2012-12-09: 2500 mg via INTRAVENOUS
  Filled 2012-12-09: qty 2500

## 2012-12-09 MED ORDER — SODIUM CHLORIDE 0.9 % IV SOLN
1000.0000 mL | INTRAVENOUS | Status: DC
  Administered 2012-12-09: 1000 mL via INTRAVENOUS

## 2012-12-09 NOTE — Consult Note (Addendum)
ANTIBIOTIC CONSULT NOTE - INITIAL  Pharmacy Consult for Vancomycin and Cefepime Indication: rule out pneumonia  Allergies  Allergen Reactions  . Dust Mite Extract   . Other     Cockroach, grass and trees    Patient Measurements: Height: 6' (182.9 cm) Weight: 295 lb (133.811 kg) IBW/kg (Calculated) : 77.6  Vital Signs: Temp: 100.5 F (38.1 C) (12/05 1716) Temp src: Oral (12/05 1716) BP: 104/69 mmHg (12/05 1815) Pulse Rate: 85 (12/05 1730) Intake/Output from previous day:   Intake/Output from this shift:    Labs:  Recent Labs  12/07/12 0024 12/09/12 1440  WBC 12.1* 11.9*  HGB 12.9* 13.8  PLT 234 235  CREATININE 1.07 1.29   Estimated Creatinine Clearance: 71.1 ml/min (by C-G formula based on Cr of 1.29).  Microbiology: No results found for this or any previous visit (from the past 720 hour(s)).  Medical History: Past Medical History  Diagnosis Date  . Arthritis   . Depression   . Diabetes mellitus   . Hypertension   . Hyperlipidemia   . History of gastric ulcer     Age 89- no recurrence since.  . Transfusion history     due to bleeding ulcer at age 31  . Hypogonadism male 01/11/2011  . Squamous cell carcinoma in situ of skin of forearm 01/23/2011  . Lung nodule     right 16 mm, seen initially 6/12. 13mm in 08/2011.  [name] GERD (gastroesophageal reflux disease)   . Headache(784.0)   . Fatty liver disease, nonalcoholic    Assessment: 72yom with recent hospitalization for CP (s/p cath which showed mild non-obstructive CAD) presents to the ED with weakness. LA and WBC are elevated, CT chest shows possible pneumonia. He will be treated for HCAP.  Some renal insufficiency noted with sCr 1.29 and CrCl 56ml/min. Given weight of 133.8kg, will use the obesity nomogram for dosing.  Goal of Therapy:  Vancomycin trough level 15-20 mcg/ml  Plan:  1) Vancomycin 2500mg  IV x 1 then 1250mg  IV q12 2) Cefepime 1g IV q8 3) Follow renal function, cultures, LOT, trough at  [facility] 12/09/2012,6:16 PM   Addendum: Normalized CrCl ~51 ml/min - will adjust Vancomycin to 1.75g IV q24h following appropriate loading dose.  Wilfred Lacy, PharmD Clinical Pharmacist [phone] 12/09/2012, 9:26 PM

## 2012-12-09 NOTE — ED Notes (Signed)
Put HOB up, O2 sats remaining around 89-91%. Placed 2 liters/min on pt.

## 2012-12-09 NOTE — ED Notes (Signed)
phlebotomy at bedside.  

## 2012-12-09 NOTE — ED Notes (Signed)
Pt returned from CT °

## 2012-12-09 NOTE — ED Notes (Signed)
Spoke with MD, will in & out cath pt d/t unable to void at this time.

## 2012-12-09 NOTE — ED Notes (Signed)
Patient's son would like to leave phone number to be reached at if needed, Gearldine Bienenstock, [phone].

## 2012-12-09 NOTE — ED Provider Notes (Signed)
I saw and evaluated the patient, reviewed the resident's note and I agree with the findings and plan.  EKG Interpretation    Date/Time:  Friday December 09 2012 14:12:42 EST Ventricular Rate:  69 PR Interval:  182 QRS Duration: 116 QT Interval:  411 QTC Calculation: 440 R Axis:   -60 Text Interpretation:  Sinus rhythm Left anterior fascicular block No significant change since last tracing Confirmed by Renessa Wellnitz  MD, Ellerie Arenz ([id number]) on 12/09/2012 2:18:35 PM            Results for orders placed during the hospital encounter of 12/09/12  TROPONIN I      Result Value Range   Troponin I <0.30  <0.30 ng/mL  BASIC METABOLIC PANEL      Result Value Range   Sodium 136  135 - 145 mEq/L   Potassium 3.8  3.5 - 5.1 mEq/L   Chloride 95 (*) 96 - 112 mEq/L   CO2 24  19 - 32 mEq/L   Glucose, Bld 190 (*) 70 - 99 mg/dL   BUN 37 (*) 6 - 23 mg/dL   Creatinine, Ser 9.56  0.50 - 1.35 mg/dL   Calcium 9.5  8.4 - 21.3 mg/dL   GFR calc non Af Amer 53 (*) >90 mL/min   GFR calc Af Amer 61 (*) >90 mL/min  CBC      Result Value Range   WBC 11.9 (*) 4.0 - 10.5 K/uL   RBC 4.65  4.22 - 5.81 MIL/uL   Hemoglobin 13.8  13.0 - 17.0 g/dL   HCT 08.6  57.8 - 46.9 %   MCV 87.3  78.0 - 100.0 fL   MCH 29.7  26.0 - 34.0 pg   MCHC 34.0  30.0 - 36.0 g/dL   RDW 62.9  52.8 - 41.3 %   Platelets 235  150 - 400 K/uL  URINALYSIS, ROUTINE W REFLEX MICROSCOPIC      Result Value Range   Color, Urine YELLOW  YELLOW   APPearance HAZY (*) CLEAR   Specific Gravity, Urine 1.019  1.005 - 1.030   pH 5.0  5.0 - 8.0   Glucose, UA NEGATIVE  NEGATIVE mg/dL   Hgb urine dipstick LARGE (*) NEGATIVE   Bilirubin Urine NEGATIVE  NEGATIVE   Ketones, ur NEGATIVE  NEGATIVE mg/dL   Protein, ur 244 (*) NEGATIVE mg/dL   Urobilinogen, UA 1.0  0.0 - 1.0 mg/dL   Nitrite NEGATIVE  NEGATIVE   Leukocytes, UA NEGATIVE  NEGATIVE  URINE MICROSCOPIC-ADD ON      Result Value Range   Squamous Epithelial / LPF RARE  RARE   WBC, UA 0-2  <3 WBC/hpf    RBC / HPF 0-2  <3 RBC/hpf   Bacteria, UA FEW (*) RARE   Casts GRANULAR CAST (*) NEGATIVE   Urine-Other AMORPHOUS URATES/PHOSPHATES    CG4 I-STAT (LACTIC ACID)      Result Value Range   Lactic Acid, Venous 3.27 (*) 0.5 - 2.2 mmol/L   Dg Chest 2 View (if Patient Has Fever And/or Copd)  12/09/2012   CLINICAL DATA:  72 year old male with weakness chest pain and shortness of Breath. Initial encounter.  EXAM: CHEST  2 VIEW  COMPARISON:  CT Abdomen and Pelvis 10/2012 and earlier.  FINDINGS: Semi upright AP and lateral views of the chest. Large body habitus with lordotic AP view and mildly lower lung volumes. Stable cardiac size and mediastinal contours. No pneumothorax, pulmonary edema, pleural effusion or definite acute  pulmonary opacity.  No  acute osseous abnormality identified.  IMPRESSION: No acute cardiopulmonary abnormality.   Electronically Signed   By: Augusto Gamble M.D.   On: 12/09/2012 15:16   Dg Lumbar Spine Complete  12/07/2012   CLINICAL DATA:  Lower back pain  EXAM: LUMBAR SPINE - COMPLETE 4+ VIEW  COMPARISON:  10/24/2012 CT  FINDINGS: Multilevel degenerative changes, most severe at L4-5 and L5-S1. There is grade 1 anterolisthesis of L5 on S1 and L5 pars defects are noted. Lower thoracic/ thoracolumbar degenerative changes are again noted. There is mild wedge deformity of the T11 vertebral body anteriorly, remote. No acute fracture. No dislocation. Atherosclerotic vascular calcifications. Overlying soft tissues otherwise unremarkable.  IMPRESSION: Multilevel degenerative changes, most severe at L5-S1.  L5 pars defects and grade 1 anterolisthesis of L5 on S1.  No acute osseous finding.   Electronically Signed   By: Jearld Lesch M.D.   On: 12/07/2012 00:57   Ct Head Wo Contrast  12/07/2012   CLINICAL DATA:  Weakness, headache  EXAM: CT HEAD WITHOUT CONTRAST  CT CERVICAL SPINE WITHOUT CONTRAST  TECHNIQUE: Multidetector CT imaging of the head and cervical spine was performed following the  standard protocol without intravenous contrast. Multiplanar CT image reconstructions of the cervical spine were also generated.  COMPARISON:  None available  FINDINGS: CT HEAD FINDINGS  Diffuse prominence of the CSF containing spaces is compatible with moderately age-related atrophy. Scattered and confluent hypodensity within the periventricular and deep white matter is most consistent with chronic small vessel ischemic change.  No acute large vessel territory infarct identified. There is no acute intracranial hemorrhage. No mass or midline shift. Gray-white matter differentiation is preserved. No extra-axial fluid collection.  Calvarium is intact. Orbital soft tissues are within normal limits. Paranasal sinuses and mastoid air cells are clear.  CT CERVICAL SPINE FINDINGS  There is slight reversal of the normal cervical lordosis. Trace anterolisthesis of C3 and C4 is present. Vertebral body heights are preserved. Normal C1-2 articulations are intact. There is no definite prevertebral soft tissue swelling.  Multilevel degenerative disc disease as evidenced by intervertebral disk space narrowing, endplate sclerosis, and endplate osteophytosis is seen, most severe at C5-6.  A 1.0 x 0.9 cm sclerotic focus within the right aspect of the C2 vertebral body is noted, of uncertain clinical significance. No other focal osseous lesions identified.  Visualized soft tissues of the neck are within normal limits. No definite apical pneumothorax.  IMPRESSION: CT BRAIN:  1. No acute intracranial process identified. 2. Mild age-related atrophy and chronic microvascular ischemic changes.  CT CERVICAL SPINE:  1. No acute fracture or listhesis identified within the cervical spine. 2. Multilevel degenerative disc disease, most severe at C5-6. 3. Sclerotic lesion within the right aspect of the C2 vertebral body. A benign bone island is favored. This finding is of doubtful clinical significance.   Electronically Signed   By: Rise Mu M.D.   On: 12/07/2012 00:55   Ct Chest Wo Contrast  12/09/2012   CLINICAL DATA:  Shortness of breath.  Fever.  Evaluate for pneumonia  EXAM: CT CHEST WITHOUT CONTRAST  TECHNIQUE: Multidetector CT imaging of the chest was performed following the standard protocol without IV contrast.  COMPARISON:  12/09/2012  FINDINGS: No pleural effusion identified. There is a calcified granuloma identified within the posterior right lung base. Peripheral area of consolidation is identified within the anterior basal portion of the right upper lobe, image 45/ series 6. The remaining portions of the lungs are clear. The heart size appears  mildly enlarged. There are calcifications involving the thoracic aorta, LAD coronary arteries. No enlarged mediastinal or hilar lymph nodes identified. No axillary or supraclavicular adenopathy identified.  Incidental imaging through the upper abdomen is on unremarkable. No aggressive lytic or sclerotic bone lesions identified. Review of the visualized osseous structures is significant for mild multilevel spondylosis.  IMPRESSION: 1. There is a patchy nonspecific area of peripheral consolidation within the anterior basilar portion of the right upper lobe. This may represent early or resolving pneumonia.  2.  Prior granulomatous disease  3. Calcified atherosclerotic disease including multi vessel coronary artery calcifications.   Electronically Signed   By: Signa Kell M.D.   On: 12/09/2012 17:44   Ct Cervical Spine Wo Contrast  12/07/2012   CLINICAL DATA:  Weakness, headache  EXAM: CT HEAD WITHOUT CONTRAST  CT CERVICAL SPINE WITHOUT CONTRAST  TECHNIQUE: Multidetector CT imaging of the head and cervical spine was performed following the standard protocol without intravenous contrast. Multiplanar CT image reconstructions of the cervical spine were also generated.  COMPARISON:  None available  FINDINGS: CT HEAD FINDINGS  Diffuse prominence of the CSF containing spaces is compatible  with moderately age-related atrophy. Scattered and confluent hypodensity within the periventricular and deep white matter is most consistent with chronic small vessel ischemic change.  No acute large vessel territory infarct identified. There is no acute intracranial hemorrhage. No mass or midline shift. Gray-white matter differentiation is preserved. No extra-axial fluid collection.  Calvarium is intact. Orbital soft tissues are within normal limits. Paranasal sinuses and mastoid air cells are clear.  CT CERVICAL SPINE FINDINGS  There is slight reversal of the normal cervical lordosis. Trace anterolisthesis of C3 and C4 is present. Vertebral body heights are preserved. Normal C1-2 articulations are intact. There is no definite prevertebral soft tissue swelling.  Multilevel degenerative disc disease as evidenced by intervertebral disk space narrowing, endplate sclerosis, and endplate osteophytosis is seen, most severe at C5-6.  A 1.0 x 0.9 cm sclerotic focus within the right aspect of the C2 vertebral body is noted, of uncertain clinical significance. No other focal osseous lesions identified.  Visualized soft tissues of the neck are within normal limits. No definite apical pneumothorax.  IMPRESSION: CT BRAIN:  1. No acute intracranial process identified. 2. Mild age-related atrophy and chronic microvascular ischemic changes.  CT CERVICAL SPINE:  1. No acute fracture or listhesis identified within the cervical spine. 2. Multilevel degenerative disc disease, most severe at C5-6. 3. Sclerotic lesion within the right aspect of the C2 vertebral body. A benign bone island is favored. This finding is of doubtful clinical significance.   Electronically Signed   By: Rise Mu M.D.   On: 12/07/2012 00:55    Patient.clinically with suspicion for developing pneumonia chest x-ray was negative CT scan shows developing probable early pneumonia. Rest of workup was negative. Patient start on H. Protocol drugs  antibiotics. The cultures were done first the patient's lactic acid was elevated above 2 however clinically patient not in frank sepsis at this point in time. Patient will be admitted.  Shelda Jakes, MD 12/09/12 [phone]

## 2012-12-09 NOTE — ED Notes (Signed)
Pt reports he has been feeling sob for a few weeks now, today woke up a few times in the middle of the night d/t upper diffuse abd pain. Also c/o generalized weakness. sts when he woke up earlier he was having "the shakes, like withdrawing from something". Nad, skin warm and dry, resp e/u.

## 2012-12-09 NOTE — ED Notes (Signed)
Pt given urinal to provide urine sample

## 2012-12-09 NOTE — ED Provider Notes (Signed)
CSN: [id number]     Arrival date & time    History   First MD Initiated Contact with Patient 12/09/12 1416     Chief Complaint  Patient presents with  . Shortness of Breath   (Consider location/radiation/quality/duration/timing/severity/associated sxs/prior Treatment) HPI [name] is a 72 y.o. male w/ PMHx of DM type II, HTN, HLD, depression, OA, GERD, and NAFLD, presents to the ED w/ complaints of SOB, chest pain, and general malaise. Patient says he has had SOB and "congestion" for the past 2-3 days, which has progressively gotten worse. He has had associated cough, productive of yellow sputum, as well as some associated chest pain that he describes as a diffuse, dull pain, 8/10 in severity, worse w/ deep inspiration. Patient had a recent cardiac catheterization which showed mild non-obstructive CAD. He also describes some general fatigue and weakness as well.   Past Medical History  Diagnosis Date  . Arthritis   . Depression   . Diabetes mellitus   . Hypertension   . Hyperlipidemia   . History of gastric ulcer     Age 18- no recurrence since.  . Transfusion history     due to bleeding ulcer at age 49  . Hypogonadism male 01/11/2011  . Squamous cell carcinoma in situ of skin of forearm 01/23/2011  . Lung nodule     right 16 mm, seen initially 6/12. 13mm in 08/2011.  [name] GERD (gastroesophageal reflux disease)   . Headache(784.0)   . Fatty liver disease, nonalcoholic    Past Surgical History  Procedure Laterality Date  . Eye surgery  08/2009    cataract removal left eye--Dr Dione Booze  . Melanoma excision      left forearm   Family History  Problem Relation Age of Onset  . Arthritis Mother   . Heart disease Sister     Massive MI age 65.  . Arrhythmia Brother   . Melanoma Son   . Heart attack Brother    History  Substance Use Topics  . Smoking status: Former Smoker -- 1.00 packs/day for 25 years    Types: Cigarettes    Quit date: 01/05/1982  . Smokeless tobacco: Never  Used  . Alcohol Use: No    Review of Systems General: Positive for fatigue. Denies fever, chills, diaphoresis, appetite change.  Respiratory: Positive for SOB, DOE, cough, and chest tightness. Denies wheezing.   Cardiovascular: Positive for diffuse chest pain. Denies palpitations and leg swelling.  Gastrointestinal: Denies nausea, vomiting, abdominal pain, diarrhea, constipation, blood in stool and abdominal distention.  Genitourinary: Denies dysuria, urgency, frequency, hematuria, flank pain and difficulty urinating.  Musculoskeletal: Denies myalgias, back pain, joint swelling, arthralgias and gait problem.  Skin: Denies pallor, rash and wounds.  Neurological: Positive for weakness. Denies dizziness, seizures, syncope, lightheadedness, numbness and headaches.  Psychiatric/Behavioral: Denies mood changes, confusion, nervousness, sleep disturbance and agitation.  Allergies  Dust mite extract and Other  Home Medications   Current Outpatient Rx  Name  Route  Sig  Dispense  Refill  . aspirin EC 81 MG tablet   Oral   Take 81 mg by mouth daily.         [name] atorvastatin (LIPITOR) 40 MG tablet   Oral   Take 1 tablet (40 mg total) by mouth daily.   30 tablet   3   . diazepam (VALIUM) 10 MG tablet   Oral   Take 1 tablet (10 mg total) by mouth at bedtime as needed for anxiety.  30 tablet   0   . diltiazem (CARDIZEM CD) 180 MG 24 hr capsule   Oral   Take 1 capsule (180 mg total) by mouth daily.   30 capsule   3   . fish oil-omega-3 fatty acids 1000 MG capsule   Oral   Take 2 g by mouth 2 (two) times daily.           . hydrochlorothiazide (HYDRODIURIL) 25 MG tablet   Oral   Take 1 tablet (25 mg total) by mouth daily.   30 tablet   5   . HYDROcodone-acetaminophen (NORCO) 7.5-325 MG per tablet   Oral   Take 1 tablet by mouth every 6 (six) hours as needed.   90 tablet   0   . insulin glargine (LANTUS) 100 units/mL SOLN   Subcutaneous   Inject 16 Units into the skin at  bedtime.          [name] losartan (COZAAR) 100 MG tablet   Oral   Take 100 mg by mouth daily.         . metFORMIN (GLUCOPHAGE) 1000 MG tablet   Oral   Take 1,000 mg by mouth daily.         . nitroGLYCERIN (NITROSTAT) 0.4 MG SL tablet   Sublingual   Place 1 tablet (0.4 mg total) under the tongue every 5 (five) minutes x 3 doses as needed for chest pain (donot take with viagra).   60 tablet   12   . omeprazole (PRILOSEC) 20 MG capsule   Oral   Take 1 capsule (20 mg total) by mouth daily.         [name] PARoxetine (PAXIL) 30 MG tablet   Oral   Take 30 mg by mouth daily.         . pioglitazone (ACTOS) 30 MG tablet   Oral   Take 1 tablet (30 mg total) by mouth daily.   30 tablet   3   . tamsulosin (FLOMAX) 0.4 MG CAPS   Oral   Take 1 capsule (0.4 mg total) by mouth daily.   30 capsule   3    Physical Exam Filed Vitals:   12/09/12 1405 12/09/12 1423  BP:  117/53  Temp: 100.3 F (37.9 C)   TempSrc: Oral   Resp: 3   Height: 6' (1.829 m)   Weight: 295 lb (133.811 kg)   SpO2: 93%   General: Vital signs reviewed.  Patient is a well-developed and well-nourished, in no acute distress and cooperative with exam. Alert and oriented x3. Appears lethargic. Head: Normocephalic and atraumatic. Eyes: PERRL, EOMI, conjunctivae normal, No scleral icterus.  Neck: Supple, trachea midline, normal ROM, No JVD, masses, thyromegaly, or carotid bruit present.  Cardiovascular: RRR, S1 normal, S2 normal, no murmurs, gallops, or rubs. Pulmonary/Chest: Mild tachypnea w/ significant coarse breath sounds on auscultation. No wheeze.  Abdominal: Soft, non-tender, non-distended, bowel sounds are normal, no masses, organomegaly, or guarding present.  Musculoskeletal: No joint deformities, erythema, or stiffness, ROM full and no nontender. Extremities: No swelling or edema,  pulses symmetric and intact bilaterally. No cyanosis or clubbing. Neurological: A&O x3, Strength is normal and symmetric  bilaterally, cranial nerve II-XII are grossly intact, no focal motor deficit, sensory intact to light touch bilaterally.  Skin: Warm, dry and intact. No rashes or erythema. Psychiatric: Normal mood and affect. speech and behavior is normal. Cognition and memory are normal.   ED Course  Procedures (including critical care time) Labs Review Labs  Reviewed  TROPONIN I  BASIC METABOLIC PANEL  CBC   Imaging Review No results found.  EKG Interpretation    Date/Time:  Friday December 09 2012 14:12:42 EST Ventricular Rate:  69 PR Interval:  182 QRS Duration: 116 QT Interval:  411 QTC Calculation: 440 R Axis:   -60 Text Interpretation:  Sinus rhythm Left anterior fascicular block No significant change since last tracing Confirmed by Kani Chauvin  MD, Sherlon Nied ([id number]) on 12/09/2012 2:18:35 PM            MDM   [name] is a 72 y.o. male w/ PMHx of DM type II, HTN, HLD, depression, OA, GERD, and NAFLD, presents to the ED w/ complaints of SOB, chest pain, and general malaise. Given clinical presentation, patient appears to have a possible pneumonia, very coarse breath sounds on exam, mild fever. Chest pain most likely related to pulmonary issues at this time, especially given recent non-obstructive disease seen on cardiac cath. Well's score for this patient 0, very low probability for PE. Satting in the mid-90's on exam. Given hospitalization w/in last 3 months, patient should be treated as HCAP if labs/CXR suggest pneumonia. No recent aspiration event according to the patient. -Lactic acid 3.27. -CXR shows no acute cardiopulmonary abnormality, however, given his clinical presentation, pneumonia is still a significant possibility. CT chest performed, shows patchy nonspecific area of peripheral consolidation within the anterior basilar portion of the right upper lobe, which may represent early or resolving pneumonia. Prior granulomatous disease also present. -EKG shows no changes from  previous. No obvious ST/T wave changes. Troponin -ve. -CBC shows mild leukocytosis at 11.9, BMET wnl, Cr near baseline @ 1.29. -Blood cultures ordered.  -UA shows 100 protein, large Hb w/ 0-2 RBC's, few bacteria, and granular casts.  -Will start HCAP coverage w/ Vancomycin and Cefipime per pharmacy. Also started NS @ 125 ml/hr. Discussed w/ [facility] who will admit.  Courtney Paris, MD 12/09/12 1914  Shelda Jakes, MD 12/09/12 [phone]

## 2012-12-09 NOTE — Progress Notes (Signed)
Pt's temp upon arrival to floor 102.3. MD notified. New orders entered. Will continue to monitor.   Kenon Delashmit,MSN,RN

## 2012-12-09 NOTE — H&P (Signed)
[facility] History and Physical  Christian Morris NFA:[id number] DOB: [birth year] DOA: 12/09/2012  Referring physician: ED PCP: Lemont Fillers., NP   Chief Complaint:  Progressive shortness of breath and chest congestion for past 4-5 days    HPI:  72 year old obese male with history of hypertension, dyslipidemia, diabetes mellitus, GERD, atypical chest pain with recent cardiac cath with nonobstructive coronary artery disease presented to the ED with symptoms of progressive shortness of breath for past 4-5 days. He reports that symptoms were progressively getting worse associated with productive cough with yellowish sputum and chest congestion worsened with coughing and deep inspiration. He reports generalized malaise, weakness and nasal congestion as well. He denies any headache but has subjective fever, and chills, denies nausea, vomiting, palpitations, orthopnea, PND, leg swelling, abdominal pain, bowel or urinary symptoms.   Course in the ED Patient noted to be febrile 202.49F, was tachycardic to 106, a kidney to 35 with normal blood pressure and O2 sat dropped to 88% on room air. Blood will boxwood leukocytosis with WBC of 12,000 and blood will go on to. 2 sets of troponin were negative. EKG was unremarkable. Chest x-ray done showed no acute abnormality. On exam she had diffuse crackles Solo CT chest without contrast was done which showed a patchy nonspecific area of peripheral consolidation over the anterior basilar portion of right upper lobe.  Patient started on empiric IV vancomycin and cefepime for healthcare associated pneumonia, IV hydration and [facility] called for consult.   Review of Systems:  Constitutional:  fever, chills,  appetite change and fatigue.  HEENT: Denies photophobia, eye pain,  hearing loss, ear pain, congestion+,  Rhinorrhea+, sore throat,sneezing, mouth sores, trouble swallowing, neck pain, neck stiffness and tinnitus.   Respiratory:  SOB,  DOE, cough, chest tightness,  denies wheezing Cardiovascular: Denies chest pain, palpitations and leg swelling.  Gastrointestinal: Denies nausea, vomiting, abdominal pain, diarrhea, constipation, blood in stool and abdominal distention.  Genitourinary: Denies dysuria, urgency, frequency, hematuria, flank pain and difficulty urinating.  Endocrine: Denies polyuria, polydipsia. Musculoskeletal: Diffuse myalgias, denies back pain, joint swelling, arthralgias and gait problem.  Skin: Denies pallor, rash and wound.  Neurological:weakness, Denies dizziness, seizures, syncope,  light-headedness, numbness and headaches.  Psychiatric/Behavioral: Denies  confusion, nervousness, sleep disturbance and agitation   Past Medical History  Diagnosis Date  . Arthritis   . Depression   . Diabetes mellitus   . Hypertension   . Hyperlipidemia   . History of gastric ulcer     Age 35- no recurrence since.  . Transfusion history     due to bleeding ulcer at age 2  . Hypogonadism male 01/11/2011  . Squamous cell carcinoma in situ of skin of forearm 01/23/2011  . Lung nodule     right 16 mm, seen initially 6/12. 13mm in 08/2011.  [name] GERD (gastroesophageal reflux disease)   . Headache(784.0)   . Fatty liver disease, nonalcoholic    Past Surgical History  Procedure Laterality Date  . Eye surgery  08/2009    cataract removal left eye--Dr Dione Booze  . Melanoma excision      left forearm   Social History:  reports that he quit smoking about 30 years ago. His smoking use included Cigarettes. He has a 25 pack-year smoking history. He has never used smokeless tobacco. He reports that he does not drink alcohol or use illicit drugs.  Allergies  Allergen Reactions  . Dust Mite Extract   . Other     Cockroach, grass and trees  Family History  Problem Relation Age of Onset  . Arthritis Mother   . Heart disease Sister     Massive MI age 19.  . Arrhythmia Brother   . Melanoma Son   . Heart attack Brother      Prior to Admission medications   Medication Sig Start Date End Date Taking? Authorizing Provider  aspirin EC 81 MG tablet Take 81 mg by mouth daily.   Yes Historical Provider, MD  atorvastatin (LIPITOR) 40 MG tablet Take 1 tablet (40 mg total) by mouth daily. 10/10/12  Yes Sandford Craze, NP  diazepam (VALIUM) 10 MG tablet Take 1 tablet (10 mg total) by mouth at bedtime as needed for anxiety. 10/26/12  Yes Piedad Climes, PA-C  diltiazem (CARDIZEM CD) 180 MG 24 hr capsule Take 1 capsule (180 mg total) by mouth daily. 10/07/12  Yes Piedad Climes, PA-C  fish oil-omega-3 fatty acids 1000 MG capsule Take 2 g by mouth 2 (two) times daily.     Yes Historical Provider, MD  hydrochlorothiazide (HYDRODIURIL) 25 MG tablet Take 1 tablet (25 mg total) by mouth daily. 05/27/12  Yes Sandford Craze, NP  HYDROcodone-acetaminophen (NORCO) 7.5-325 MG per tablet Take 1 tablet by mouth every 6 (six) hours as needed. 11/28/12  Yes Sandford Craze, NP  insulin glargine (LANTUS) 100 units/mL SOLN Inject 16 Units into the skin at bedtime.    Yes Historical Provider, MD  losartan (COZAAR) 100 MG tablet Take 100 mg by mouth daily.   Yes Historical Provider, MD  metFORMIN (GLUCOPHAGE) 1000 MG tablet Take 1,000 mg by mouth daily. 11/07/12  Yes Historical Provider, MD  nitroGLYCERIN (NITROSTAT) 0.4 MG SL tablet Place 1 tablet (0.4 mg total) under the tongue every 5 (five) minutes x 3 doses as needed for chest pain (donot take with viagra). 10/15/12  Yes Ripudeep Jenna Luo, MD  omeprazole (PRILOSEC) 20 MG capsule Take 1 capsule (20 mg total) by mouth daily. 01/05/12  Yes Lesle Chris Black, NP  PARoxetine (PAXIL) 30 MG tablet Take 30 mg by mouth daily.   Yes Historical Provider, MD  pioglitazone (ACTOS) 30 MG tablet Take 1 tablet (30 mg total) by mouth daily. 10/31/12  Yes Sandford Craze, NP  tamsulosin (FLOMAX) 0.4 MG CAPS Take 1 capsule (0.4 mg total) by mouth daily. 07/25/12  Yes Sandford Craze, NP     Physical Exam:  Filed Vitals:   12/09/12 1720 12/09/12 1724 12/09/12 1730 12/09/12 1815  BP: 116/54  104/69 104/69  Pulse: 89 86 85 81  Temp:      TempSrc:      Resp: 13 18 25 26   Height:      Weight:      SpO2: 88% 96% 95% 100%    Constitutional: Vital signs reviewed.  Elevate obese male lying in bed appears fatigued HEENT: No pallor, no icterus, nasal congestion, no JVD Chest: Bilateral diffuse coarse crackles, no rhonchi or wheeze Cardiovascular: RRR, S1 normal, S2 normal, no MRG,  Pulmonary/Chest: CTAB, no wheezes, rales, or rhonchi Abdominal: Soft. Non-tender, non-distended, bowel sounds are normal, no masses, organomegaly, or guarding present.   extremity: Warm, no edema CNS: AAO x3  Labs on Admission:  Basic Metabolic Panel:  Recent Labs Lab 12/07/12 0024 12/09/12 1440  NA 132* 136  K 4.1 3.8  CL 92* 95*  CO2 24 24  GLUCOSE 212* 190*  BUN 31* 37*  CREATININE 1.07 1.29  CALCIUM 9.8 9.5   Liver Function Tests:  Recent Labs Lab 12/07/12 0024  AST 38*  ALT 23  ALKPHOS 71  BILITOT 0.5  PROT 7.9  ALBUMIN 4.4   No results found for this basename: LIPASE, AMYLASE,  in the last 168 hours No results found for this basename: AMMONIA,  in the last 168 hours CBC:  Recent Labs Lab 12/07/12 0024 12/09/12 1440  WBC 12.1* 11.9*  NEUTROABS 10.1*  --   HGB 12.9* 13.8  HCT 38.9* 40.6  MCV 87.6 87.3  PLT 234 235   Cardiac Enzymes:  Recent Labs Lab 12/07/12 0024 12/09/12 1449  TROPONINI <0.30 <0.30   BNP: No components found with this basename: POCBNP,  CBG:  Recent Labs Lab 12/06/12 2340  GLUCAP 215*    Radiological Exams on Admission: Dg Chest 2 View (if Patient Has Fever And/or Copd)  12/09/2012   CLINICAL DATA:  72 year old male with weakness chest pain and shortness of Breath. Initial encounter.  EXAM: CHEST  2 VIEW  COMPARISON:  CT Abdomen and Pelvis 10/2012 and earlier.  FINDINGS: Semi upright AP and lateral views of the chest. Large  body habitus with lordotic AP view and mildly lower lung volumes. Stable cardiac size and mediastinal contours. No pneumothorax, pulmonary edema, pleural effusion or definite acute  pulmonary opacity.  No acute osseous abnormality identified.  IMPRESSION: No acute cardiopulmonary abnormality.   Electronically Signed   By: Augusto Gamble M.D.   On: 12/09/2012 15:16   Ct Chest Wo Contrast  12/09/2012   CLINICAL DATA:  Shortness of breath.  Fever.  Evaluate for pneumonia  EXAM: CT CHEST WITHOUT CONTRAST  TECHNIQUE: Multidetector CT imaging of the chest was performed following the standard protocol without IV contrast.  COMPARISON:  12/09/2012  FINDINGS: No pleural effusion identified. There is a calcified granuloma identified within the posterior right lung base. Peripheral area of consolidation is identified within the anterior basal portion of the right upper lobe, image 45/ series 6. The remaining portions of the lungs are clear. The heart size appears mildly enlarged. There are calcifications involving the thoracic aorta, LAD coronary arteries. No enlarged mediastinal or hilar lymph nodes identified. No axillary or supraclavicular adenopathy identified.  Incidental imaging through the upper abdomen is on unremarkable. No aggressive lytic or sclerotic bone lesions identified. Review of the visualized osseous structures is significant for mild multilevel spondylosis.  IMPRESSION: 1. There is a patchy nonspecific area of peripheral consolidation within the anterior basilar portion of the right upper lobe. This may represent early or resolving pneumonia.  2.  Prior granulomatous disease  3. Calcified atherosclerotic disease including multi vessel coronary artery calcifications.   Electronically Signed   By: Signa Kell M.D.   On: 12/09/2012 17:44    EKG: NSR, LAFB, no ST-T changes Assessment/Plan  Principal Problem:   SIRS (systemic inflammatory response syndrome) Associated with healthcare associated  pneumonia. Admit to telemetry. Empiric antibiotic coverage with IV vancomycin and cefepime. Blood culture sent from the ED. Follow sputum culture, urine for strep antigen and Legionella antigen. Given his acute symptoms of fever with URI symptoms and generalized malaise we'll rule out for flu. Ordered tamiflu.  Active Problems:   DIABETES MELLITUS, TYPE II hold metformin and Actos. Place on sliding scale insulin       SLEEP APNEA, OBSTRUCTIVE Will order CPAP    HYPERTENSION  stable. Continue home medications    CHRONIC OBSTRUCTIVE PULMONARY DISEASE Will order albuterol and Atrovent nebs    GERD Continue PPI     BPH (benign prostatic hyperplasia) Continue Flomax  Diet: Diabetic  DVT prophylaxis: Subcutaneous Lovenox  Code Status: Full code Family Communication: At bedside Disposition Plan: Home once improved  Eddie North [facility] Pager [phone]  If 7PM-7AM, please contact night-coverage [email or web address] Password [email or web address] 12/09/2012, 7:12 PM  Total time spent on admission: 70 MINUTES

## 2012-12-09 NOTE — ED Notes (Signed)
Pt attempted to urinate, unsuccessful ?

## 2012-12-09 NOTE — ED Notes (Signed)
Per EMS - pt c/o generalized weakness. Reports he was here last week for the same issue but didn't have anything done about it. Pt sounds congested. BP 112/70 HR 74 nsr 96% on room air. RR 18. Upon ems arrival to pt's home he was trying to get out of the bed, slide out of the bed onto the floor. Denies falling/hitting head/loc.

## 2012-12-09 NOTE — ED Notes (Signed)
Pt reports he wears a CPAP machine at home when he sleeps but isn't sure why, just reports that he wasn't breathing well so that is why they put him on it.

## 2012-12-10 ENCOUNTER — Inpatient Hospital Stay (HOSPITAL_COMMUNITY): Payer: Medicare Other

## 2012-12-10 LAB — BASIC METABOLIC PANEL
BUN: 30 mg/dL — ABNORMAL HIGH (ref 6–23)
Calcium: 8.9 mg/dL (ref 8.4–10.5)
Creatinine, Ser: 1.15 mg/dL (ref 0.50–1.35)
GFR calc Af Amer: 71 mL/min — ABNORMAL LOW (ref >90)
Sodium: 137 mEq/L (ref 135–145)

## 2012-12-10 LAB — BLOOD GAS, ARTERIAL
Acid-Base Excess: 0.5 mmol/L (ref 0.0–2.0)
Drawn by: 21338
O2 Content: 3 L/min
O2 Saturation: 94.6 %
pCO2 arterial: 33 mmHg — ABNORMAL LOW (ref 35.0–45.0)
pH, Arterial: 7.471 — ABNORMAL HIGH (ref 7.350–7.450)
pO2, Arterial: 71.5 mmHg — ABNORMAL LOW (ref 80.0–100.0)

## 2012-12-10 LAB — CBC
MCHC: 32.9 g/dL (ref 30.0–36.0)
MCV: 87.7 fL (ref 78.0–100.0)
Platelets: 217 10*3/uL (ref 150–400)
RDW: 14.1 % (ref 11.5–15.5)
WBC: 14.2 10*3/uL — ABNORMAL HIGH (ref 4.0–10.5)

## 2012-12-10 LAB — GLUCOSE, CAPILLARY
Glucose-Capillary: 157 mg/dL — ABNORMAL HIGH (ref 70–99)
Glucose-Capillary: 164 mg/dL — ABNORMAL HIGH (ref 70–99)

## 2012-12-10 LAB — EXPECTORATED SPUTUM ASSESSMENT W REFEX TO RESP CULTURE

## 2012-12-10 LAB — EXPECTORATED SPUTUM ASSESSMENT W GRAM STAIN, RFLX TO RESP C

## 2012-12-10 LAB — HIV ANTIBODY (ROUTINE TESTING W REFLEX): HIV: NONREACTIVE

## 2012-12-10 MED ORDER — IPRATROPIUM BROMIDE 0.02 % IN SOLN: 0.5000 mg | Freq: Four times a day (QID) | RESPIRATORY_TRACT | Status: DC

## 2012-12-10 MED ORDER — ALBUTEROL SULFATE (5 MG/ML) 0.5% IN NEBU
2.5000 mg | INHALATION_SOLUTION | Freq: Four times a day (QID) | RESPIRATORY_TRACT | Status: DC
  Administered 2012-12-11 – 2012-12-12 (×4): 2.5 mg via RESPIRATORY_TRACT
  Filled 2012-12-10 (×4): qty 0.5

## 2012-12-10 MED ORDER — ALBUTEROL SULFATE (5 MG/ML) 0.5% IN NEBU: 2.5000 mg | INHALATION_SOLUTION | Freq: Four times a day (QID) | RESPIRATORY_TRACT | Status: DC

## 2012-12-10 MED ORDER — ALBUTEROL SULFATE (5 MG/ML) 0.5% IN NEBU
INHALATION_SOLUTION | RESPIRATORY_TRACT | Status: AC
  Filled 2012-12-10: qty 0.5

## 2012-12-10 MED ORDER — AZITHROMYCIN 500 MG IV SOLR
500.0000 mg | INTRAVENOUS | Status: DC
  Administered 2012-12-10 – 2012-12-11 (×2): 500 mg via INTRAVENOUS
  Filled 2012-12-10 (×3): qty 500

## 2012-12-10 MED ORDER — FUROSEMIDE 10 MG/ML IJ SOLN
20.0000 mg | Freq: Once | INTRAMUSCULAR | Status: AC
  Administered 2012-12-10: 20 mg via INTRAVENOUS
  Filled 2012-12-10: qty 2

## 2012-12-10 MED ORDER — IPRATROPIUM BROMIDE 0.02 % IN SOLN
RESPIRATORY_TRACT | Status: AC
  Administered 2012-12-10: 0.5 mg
  Filled 2012-12-10: qty 2.5

## 2012-12-10 MED ORDER — SODIUM CHLORIDE 0.9 % IV SOLN: 1000.0000 mL | INTRAVENOUS | Status: DC

## 2012-12-10 MED ORDER — ALBUTEROL SULFATE (5 MG/ML) 0.5% IN NEBU: 2.5000 mg | INHALATION_SOLUTION | RESPIRATORY_TRACT | Status: DC

## 2012-12-10 MED ORDER — IPRATROPIUM BROMIDE 0.02 % IN SOLN
0.5000 mg | Freq: Four times a day (QID) | RESPIRATORY_TRACT | Status: DC
  Administered 2012-12-11 – 2012-12-12 (×4): 0.5 mg via RESPIRATORY_TRACT
  Filled 2012-12-10 (×4): qty 2.5

## 2012-12-10 NOTE — Progress Notes (Signed)
Placed pt on cpap as per order. Pt. Is tolerating well at this time. RN aware.

## 2012-12-10 NOTE — Progress Notes (Signed)
Patient asked to be put back on CPAP due to SOB.

## 2012-12-10 NOTE — Progress Notes (Signed)
Flutter valve is scheduled Q4 while awake. Patient is currently resting comfortably, already on CPAP machine, will not administer flutter at this time. RT will continue to assist as needed.

## 2012-12-10 NOTE — Progress Notes (Signed)
[facility]                                                                                Patient Demographics  Dontel Harshberger, is a 72 y.o. male, DOB - 07-16-1940, YNW:[id number]  Admit date - 12/09/2012   Admitting Physician No admitting provider for patient encounter.  Outpatient Primary MD for the patient is Lemont Fillers., NP  LOS - 1   Chief Complaint  Patient presents with  . Shortness of Breath        Assessment & Plan    SIRS (systemic inflammatory response syndrome) due to HCAP Currently on broad-spectrum antibiotics have added azithromycin for atypical coverage for now will continue with Tamiflu, pending influenza PCR, he still quite hypoxic which ABGs prove, oxygen supplementation increased, added pulmonary toiletry along with chest physiotherapy, there is also some element of vascular condition and will obtain an echogram and given one dose of Lasix. Stop IV fluids.    DIABETES MELLITUS, TYPE II  hold metformin and Actos. Place on sliding scale insulin , will add lantus  Lab Results  Component Value Date   HGBA1C 9.0* 10/14/2012   CBG (last 3)   Recent Labs  12/09/12 2153 12/10/12 0625  GLUCAP 176* 179*      SLEEP APNEA, OBSTRUCTIVE  Tinea nighttime CPAP    HYPERTENSION  stable. Continue home medications     CHRONIC OBSTRUCTIVE PULMONARY DISEASE  Will order albuterol and Atrovent nebs , no wheezing on exam no need for steroids.    GERD  Continue PPI     BPH (benign prostatic hyperplasia)  Continue Flomax    Code Status: full  Family Communication:    Disposition Plan: home   Procedures Echo, CT chest   Consults      Medications  Scheduled Meds: . ipratropium  0.5 mg Nebulization Q4H   And  . albuterol  2.5 mg Nebulization Q4H  . albuterol      . aspirin EC  81 mg Oral Daily  . atorvastatin  40 mg Oral Daily  . azithromycin  500 mg Intravenous Q24H  . ceFEPime (MAXIPIME) IV  1 g Intravenous Q8H  .  diltiazem  180 mg Oral Daily  . enoxaparin (LOVENOX) injection  40 mg Subcutaneous Q24H  . furosemide  20 mg Intravenous Once  . hydrochlorothiazide  25 mg Oral Daily  . insulin aspart  0-15 Units Subcutaneous TID WC  . insulin glargine  16 Units Subcutaneous QHS  . ipratropium      . losartan  100 mg Oral Daily  . omega-3 acid ethyl esters  2 g Oral BID  . oseltamivir  75 mg Oral BID  . pantoprazole  40 mg Oral Daily  . PARoxetine  30 mg Oral Daily  . tamsulosin  0.4 mg Oral Daily  . vancomycin  1,750 mg Intravenous Q24H   Continuous Infusions:  PRN Meds:.acetaminophen, diazepam, HYDROcodone-acetaminophen, nitroGLYCERIN  DVT Prophylaxis  Lovenox    Lab Results  Component Value Date   PLT 217 12/10/2012    Antibiotics     Anti-infectives   Start     Dose/Rate Route Frequency Ordered Stop   12/10/12 1800  vancomycin (  VANCOCIN) 1,750 mg in sodium chloride 0.9 % 500 mL IVPB     1,750 mg 250 mL/hr over 120 Minutes Intravenous Every 24 hours 12/09/12 2129     12/10/12 1000  azithromycin (ZITHROMAX) 500 mg in dextrose 5 % 250 mL IVPB     500 mg 250 mL/hr over 60 Minutes Intravenous Every 24 hours 12/10/12 0936     12/10/12 0700  vancomycin (VANCOCIN) 1,250 mg in sodium chloride 0.9 % 250 mL IVPB  Status:  Discontinued     1,250 mg 166.7 mL/hr over 90 Minutes Intravenous Every 12 hours 12/09/12 1824 12/09/12 2129   12/10/12 0200  ceFEPIme (MAXIPIME) 1 g in dextrose 5 % 50 mL IVPB     1 g 100 mL/hr over 30 Minutes Intravenous 3 times per day 12/09/12 1824     12/09/12 2200  ceFEPIme (MAXIPIME) 1 g in dextrose 5 % 50 mL IVPB  Status:  Discontinued     1 g 100 mL/hr over 30 Minutes Intravenous 3 times per day 12/09/12 2050 12/09/12 2053   12/09/12 2200  oseltamivir (TAMIFLU) capsule 75 mg     75 mg Oral 2 times daily 12/09/12 2050 12/14/12 2159   12/09/12 1830  vancomycin (VANCOCIN) 2,500 mg in sodium chloride 0.9 % 500 mL IVPB     2,500 mg 250 mL/hr over 120 Minutes Intravenous   Once 12/09/12 1824 12/09/12 2137   12/09/12 1815  ceFEPIme (MAXIPIME) 1 g in dextrose 5 % 50 mL IVPB     1 g 100 mL/hr over 30 Minutes Intravenous  Once 12/09/12 1813 12/09/12 1935          Subjective:   Alden Server Durall today has, No headache, No chest pain, No abdominal pain - No Nausea, No new weakness tingling or numbness, + Cough & SOB.    Objective:   Filed Vitals:   12/10/12 0002 12/10/12 0132 12/10/12 0427 12/10/12 0444  BP:   144/75   Pulse: 80  85   Temp:   100.7 F (38.2 C)   TempSrc:   Axillary   Resp: 17  21   Height:      Weight:      SpO2: 94% 91% 91% 93%    Wt Readings from Last 3 Encounters:  12/09/12 125.42 kg (276 lb 8 oz)  11/15/12 135.226 kg (298 lb 1.9 oz)  11/02/12 131.543 kg (290 lb)    No intake or output data in the 24 hours ending 12/10/12 1147  Exam Awake Alert, Oriented X 3, No new F.N deficits, Normal affect [place].AT,PERRAL Supple Neck,No JVD, No cervical lymphadenopathy appriciated.  Symmetrical Chest wall movement, Good air movement bilaterally, Coarse B sounds RRR,No Gallops,Rubs or new Murmurs, No Parasternal Heave +ve B.Sounds, Abd Soft, Non tender, No organomegaly appriciated, No rebound - guarding or rigidity. No Cyanosis, Clubbing or edema, No new Rash or bruise      Data Review   Micro Results Recent Results (from the past 240 hour(s))  CULTURE, BLOOD (ROUTINE X 2)     Status: None   Collection Time    12/09/12  3:30 PM      Result Value Range Status   Specimen Description BLOOD ARM RIGHT   Final   Special Requests BOTTLES DRAWN AEROBIC AND ANAEROBIC 10CC   Final   Culture  Setup Time     Final   Value: 12/09/2012 21:04     Performed at [facility]   Culture     Final  Value:        BLOOD CULTURE RECEIVED NO GROWTH TO DATE CULTURE WILL BE HELD FOR 5 DAYS BEFORE ISSUING A FINAL NEGATIVE REPORT     Performed at [facility]   Report Status PENDING   Incomplete  CULTURE, BLOOD (ROUTINE X 2)     Status: None    Collection Time    12/09/12  3:35 PM      Result Value Range Status   Specimen Description BLOOD HAND RIGHT   Final   Special Requests BOTTLES DRAWN AEROBIC ONLY 10CC   Final   Culture  Setup Time     Final   Value: 12/09/2012 21:04     Performed at [facility]   Culture     Final   Value:        BLOOD CULTURE RECEIVED NO GROWTH TO DATE CULTURE WILL BE HELD FOR 5 DAYS BEFORE ISSUING A FINAL NEGATIVE REPORT     Performed at [facility]   Report Status PENDING   Incomplete  CULTURE, EXPECTORATED SPUTUM-ASSESSMENT     Status: None   Collection Time    12/10/12  9:17 AM      Result Value Range Status   Specimen Description SPUTUM   Final   Special Requests Normal   Final   Sputum evaluation     Final   Value: THIS SPECIMEN IS ACCEPTABLE. RESPIRATORY CULTURE REPORT TO FOLLOW.   Report Status 12/10/2012 FINAL   Final    Radiology Reports Dg Chest 2 View  12/10/2012   CLINICAL DATA:  Respiratory failure and pneumonia  EXAM: CHEST  2 VIEW  COMPARISON:  12/09/2012  FINDINGS: The heart size and mediastinal contours are within normal limits. The lung volumes appear low. There is pulmonary vascular congestion but no overt edema. No airspace consolidation identified. The visualized skeletal structures are unremarkable.  IMPRESSION: 1. Low lung volumes and pulmonary vascular congestion.   Electronically Signed   By: Signa Kell M.D.   On: 12/10/2012 10:17   Dg Chest 2 View (if Patient Has Fever And/or Copd)  12/09/2012   CLINICAL DATA:  72 year old male with weakness chest pain and shortness of Breath. Initial encounter.  EXAM: CHEST  2 VIEW  COMPARISON:  CT Abdomen and Pelvis 10/2012 and earlier.  FINDINGS: Semi upright AP and lateral views of the chest. Large body habitus with lordotic AP view and mildly lower lung volumes. Stable cardiac size and mediastinal contours. No pneumothorax, pulmonary edema, pleural effusion or definite acute  pulmonary opacity.  No acute osseous  abnormality identified.  IMPRESSION: No acute cardiopulmonary abnormality.   Electronically Signed   By: Augusto Gamble M.D.   On: 12/09/2012 15:16   Dg Lumbar Spine Complete  12/07/2012   CLINICAL DATA:  Lower back pain  EXAM: LUMBAR SPINE - COMPLETE 4+ VIEW  COMPARISON:  10/24/2012 CT  FINDINGS: Multilevel degenerative changes, most severe at L4-5 and L5-S1. There is grade 1 anterolisthesis of L5 on S1 and L5 pars defects are noted. Lower thoracic/ thoracolumbar degenerative changes are again noted. There is mild wedge deformity of the T11 vertebral body anteriorly, remote. No acute fracture. No dislocation. Atherosclerotic vascular calcifications. Overlying soft tissues otherwise unremarkable.  IMPRESSION: Multilevel degenerative changes, most severe at L5-S1.  L5 pars defects and grade 1 anterolisthesis of L5 on S1.  No acute osseous finding.   Electronically Signed   By: Jearld Lesch M.D.   On: 12/07/2012 00:57   Ct Head Wo  Contrast  12/07/2012   CLINICAL DATA:  Weakness, headache  EXAM: CT HEAD WITHOUT CONTRAST  CT CERVICAL SPINE WITHOUT CONTRAST  TECHNIQUE: Multidetector CT imaging of the head and cervical spine was performed following the standard protocol without intravenous contrast. Multiplanar CT image reconstructions of the cervical spine were also generated.  COMPARISON:  None available  FINDINGS: CT HEAD FINDINGS  Diffuse prominence of the CSF containing spaces is compatible with moderately age-related atrophy. Scattered and confluent hypodensity within the periventricular and deep white matter is most consistent with chronic small vessel ischemic change.  No acute large vessel territory infarct identified. There is no acute intracranial hemorrhage. No mass or midline shift. Gray-white matter differentiation is preserved. No extra-axial fluid collection.  Calvarium is intact. Orbital soft tissues are within normal limits. Paranasal sinuses and mastoid air cells are clear.  CT CERVICAL SPINE  FINDINGS  There is slight reversal of the normal cervical lordosis. Trace anterolisthesis of C3 and C4 is present. Vertebral body heights are preserved. Normal C1-2 articulations are intact. There is no definite prevertebral soft tissue swelling.  Multilevel degenerative disc disease as evidenced by intervertebral disk space narrowing, endplate sclerosis, and endplate osteophytosis is seen, most severe at C5-6.  A 1.0 x 0.9 cm sclerotic focus within the right aspect of the C2 vertebral body is noted, of uncertain clinical significance. No other focal osseous lesions identified.  Visualized soft tissues of the neck are within normal limits. No definite apical pneumothorax.  IMPRESSION: CT BRAIN:  1. No acute intracranial process identified. 2. Mild age-related atrophy and chronic microvascular ischemic changes.  CT CERVICAL SPINE:  1. No acute fracture or listhesis identified within the cervical spine. 2. Multilevel degenerative disc disease, most severe at C5-6. 3. Sclerotic lesion within the right aspect of the C2 vertebral body. A benign bone island is favored. This finding is of doubtful clinical significance.   Electronically Signed   By: Rise Mu M.D.   On: 12/07/2012 00:55   Ct Chest Wo Contrast  12/09/2012   CLINICAL DATA:  Shortness of breath.  Fever.  Evaluate for pneumonia  EXAM: CT CHEST WITHOUT CONTRAST  TECHNIQUE: Multidetector CT imaging of the chest was performed following the standard protocol without IV contrast.  COMPARISON:  12/09/2012  FINDINGS: No pleural effusion identified. There is a calcified granuloma identified within the posterior right lung base. Peripheral area of consolidation is identified within the anterior basal portion of the right upper lobe, image 45/ series 6. The remaining portions of the lungs are clear. The heart size appears mildly enlarged. There are calcifications involving the thoracic aorta, LAD coronary arteries. No enlarged mediastinal or hilar lymph  nodes identified. No axillary or supraclavicular adenopathy identified.  Incidental imaging through the upper abdomen is on unremarkable. No aggressive lytic or sclerotic bone lesions identified. Review of the visualized osseous structures is significant for mild multilevel spondylosis.  IMPRESSION: 1. There is a patchy nonspecific area of peripheral consolidation within the anterior basilar portion of the right upper lobe. This may represent early or resolving pneumonia.  2.  Prior granulomatous disease  3. Calcified atherosclerotic disease including multi vessel coronary artery calcifications.   Electronically Signed   By: Signa Kell M.D.   On: 12/09/2012 17:44   Ct Cervical Spine Wo Contrast  12/07/2012   CLINICAL DATA:  Weakness, headache  EXAM: CT HEAD WITHOUT CONTRAST  CT CERVICAL SPINE WITHOUT CONTRAST  TECHNIQUE: Multidetector CT imaging of the head and cervical spine was performed following the  standard protocol without intravenous contrast. Multiplanar CT image reconstructions of the cervical spine were also generated.  COMPARISON:  None available  FINDINGS: CT HEAD FINDINGS  Diffuse prominence of the CSF containing spaces is compatible with moderately age-related atrophy. Scattered and confluent hypodensity within the periventricular and deep white matter is most consistent with chronic small vessel ischemic change.  No acute large vessel territory infarct identified. There is no acute intracranial hemorrhage. No mass or midline shift. Gray-white matter differentiation is preserved. No extra-axial fluid collection.  Calvarium is intact. Orbital soft tissues are within normal limits. Paranasal sinuses and mastoid air cells are clear.  CT CERVICAL SPINE FINDINGS  There is slight reversal of the normal cervical lordosis. Trace anterolisthesis of C3 and C4 is present. Vertebral body heights are preserved. Normal C1-2 articulations are intact. There is no definite prevertebral soft tissue swelling.   Multilevel degenerative disc disease as evidenced by intervertebral disk space narrowing, endplate sclerosis, and endplate osteophytosis is seen, most severe at C5-6.  A 1.0 x 0.9 cm sclerotic focus within the right aspect of the C2 vertebral body is noted, of uncertain clinical significance. No other focal osseous lesions identified.  Visualized soft tissues of the neck are within normal limits. No definite apical pneumothorax.  IMPRESSION: CT BRAIN:  1. No acute intracranial process identified. 2. Mild age-related atrophy and chronic microvascular ischemic changes.  CT CERVICAL SPINE:  1. No acute fracture or listhesis identified within the cervical spine. 2. Multilevel degenerative disc disease, most severe at C5-6. 3. Sclerotic lesion within the right aspect of the C2 vertebral body. A benign bone island is favored. This finding is of doubtful clinical significance.   Electronically Signed   By: Rise Mu M.D.   On: 12/07/2012 00:55    CBC  Recent Labs Lab 12/07/12 0024 12/09/12 1440 12/10/12 0420  WBC 12.1* 11.9* 14.2*  HGB 12.9* 13.8 13.2  HCT 38.9* 40.6 40.1  PLT 234 235 217  MCV 87.6 87.3 87.7  MCH 29.1 29.7 28.9  MCHC 33.2 34.0 32.9  RDW 13.7 14.0 14.1  LYMPHSABS 0.6*  --   --   MONOABS 1.3*  --   --   EOSABS 0.0  --   --   BASOSABS 0.1  --   --     Chemistries   Recent Labs Lab 12/07/12 0024 12/09/12 1440 12/10/12 0420  NA 132* 136 137  K 4.1 3.8 3.6  CL 92* 95* 97  CO2 24 24 26   GLUCOSE 212* 190* 195*  BUN 31* 37* 30*  CREATININE 1.07 1.29 1.15  CALCIUM 9.8 9.5 8.9  AST 38*  --   --   ALT 23  --   --   ALKPHOS 71  --   --   BILITOT 0.5  --   --    ------------------------------------------------------------------------------------------------------------------ estimated creatinine clearance is 77.1 ml/min (by C-G formula based on Cr of  1.15). ------------------------------------------------------------------------------------------------------------------ No results found for this basename: HGBA1C,  in the last 72 hours ------------------------------------------------------------------------------------------------------------------ No results found for this basename: CHOL, HDL, LDLCALC, TRIG, CHOLHDL, LDLDIRECT,  in the last 72 hours ------------------------------------------------------------------------------------------------------------------ No results found for this basename: TSH, T4TOTAL, FREET3, T3FREE, THYROIDAB,  in the last 72 hours ------------------------------------------------------------------------------------------------------------------ No results found for this basename: VITAMINB12, FOLATE, FERRITIN, TIBC, IRON, RETICCTPCT,  in the last 72 hours  Coagulation profile No results found for this basename: INR, PROTIME,  in the last 168 hours  No results found for this basename: DDIMER,  in the last  72 hours  Cardiac Enzymes  Recent Labs Lab 12/07/12 0024 12/09/12 1449  TROPONINI <0.30 <0.30   ------------------------------------------------------------------------------------------------------------------ No components found with this basename: POCBNP,      Time Spent in minutes  35   Herny Scurlock K M.D on 12/10/2012 at 11:47 AM  Between 7am to 7pm - Pager - [phone]  After 7pm go to [email or web address] - password [email or web address]  And look for the night coverage person covering for me after hours  [facility] Office  [phone]

## 2012-12-10 NOTE — Evaluation (Signed)
Clinical/Bedside Swallow Evaluation Patient Details  Name: Christian Morris MRN: [id number] Date of Birth: [birth year]  Today's Date: 12/10/2012 Time: 7846-9629 SLP Time Calculation (min): 14 min  Past Medical History:  Past Medical History  Diagnosis Date  . Arthritis   . Depression   . Diabetes mellitus   . Hypertension   . Hyperlipidemia   . History of gastric ulcer     Age 72- no recurrence since.  . Transfusion history     due to bleeding ulcer at age 69  . Hypogonadism male 01/11/2011  . Squamous cell carcinoma in situ of skin of forearm 01/23/2011  . Lung nodule     right 16 mm, seen initially 6/12. 13mm in 08/2011.  [name] GERD (gastroesophageal reflux disease)   . Headache(784.0)   . Fatty liver disease, nonalcoholic    Past Surgical History:  Past Surgical History  Procedure Laterality Date  . Eye surgery  08/2009    cataract removal left eye--Dr Dione Booze  . Melanoma excision      left forearm   HPI:  72 year old obese male with history of hypertension, dyslipidemia, diabetes mellitus, GERD, atypical chest pain with recent cardiac cath with nonobstructive coronary artery disease presented to the ED with symptoms of progressive shortness of breath for past 4-5 days.  Dx SIRS, HAP   Assessment / Plan / Recommendation Clinical Impression  Pt presents with normal oropharyngeal swallow with active mastication, swift swallow response, and no overt signs of compromised airway protection.  Pt quite congested, but RR is in 20s and well within range of good compatibility with normal swallow sequence.  Discussed with pt.  Doubt aspiration as ongoing contributor to current lung condition.  Continue current diet. No SLP f/u warranted.             Diet Recommendation Regular;Thin liquid   Liquid Administration via: Cup;Straw Medication Administration: Whole meds with liquid Supervision: Patient able to self feed    Other  Recommendations Oral Care Recommendations: Oral care BID    Follow Up Recommendations  None           Pertinent Vitals/Pain No placed         Swallow Study Prior Functional Status       General Date of Onset: 12/09/12 HPI: 72 year old obese male with history of hypertension, dyslipidemia, diabetes mellitus, GERD, atypical chest pain with recent cardiac cath with nonobstructive coronary artery disease presented to the ED with symptoms of progressive shortness of breath for past 4-5 days.  Dx SIRS, HAP Type of Study: Bedside swallow evaluation Previous Swallow Assessment: none per records Diet Prior to this Study: Regular;Thin liquids Temperature Spikes Noted: Yes Respiratory Status: Nasal cannula (and CPAP) History of Recent Intubation: No Behavior/Cognition: Alert Oral Cavity - Dentition: Adequate natural dentition Self-Feeding Abilities: Able to feed self Patient Positioning: Upright in bed Baseline Vocal Quality: Clear Volitional Cough: Strong;Congested Volitional Swallow: Able to elicit    Oral/Motor/Sensory Function Overall Oral Motor/Sensory Function: Appears within functional limits for tasks assessed   Ice Chips Ice chips: Within functional limits   Thin Liquid Thin Liquid: Within functional limits    Nectar Thick Nectar Thick Liquid: Not tested   Honey Thick Honey Thick Liquid: Not tested   Puree Puree: Within functional limits   Solid       Solid: Within functional limits      Chyane Greer L. Samson Frederic, MA CCC/SLP Pager [phone]  Blenda Mounts Laurice 12/10/2012,11:18 AM

## 2012-12-11 DIAGNOSIS — I517 Cardiomegaly: Secondary | ICD-10-CM

## 2012-12-11 LAB — STREP PNEUMONIAE URINARY ANTIGEN: Strep Pneumo Urinary Antigen: NEGATIVE

## 2012-12-11 LAB — BASIC METABOLIC PANEL
BUN: 36 mg/dL — ABNORMAL HIGH (ref 6–23)
Chloride: 99 mEq/L (ref 96–112)
Creatinine, Ser: 1.52 mg/dL — ABNORMAL HIGH (ref 0.50–1.35)
GFR calc Af Amer: 50 mL/min — ABNORMAL LOW (ref >90)
GFR calc non Af Amer: 43 mL/min — ABNORMAL LOW (ref >90)
Glucose, Bld: 158 mg/dL — ABNORMAL HIGH (ref 70–99)
Potassium: 2.9 mEq/L — ABNORMAL LOW (ref 3.5–5.1)

## 2012-12-11 LAB — CBC
HCT: 36.2 % — ABNORMAL LOW (ref 39.0–52.0)
MCHC: 32.9 g/dL (ref 30.0–36.0)
MCV: 87.7 fL (ref 78.0–100.0)
Platelets: 194 10*3/uL (ref 150–400)
RDW: 14.2 % (ref 11.5–15.5)

## 2012-12-11 LAB — OSMOLALITY, URINE: Osmolality, Ur: 385 mOsm/kg — ABNORMAL LOW (ref 390–1090)

## 2012-12-11 LAB — INFLUENZA PANEL BY PCR (TYPE A & B)
H1N1 flu by pcr: NOT DETECTED
Influenza A By PCR: NEGATIVE
Influenza B By PCR: NEGATIVE

## 2012-12-11 LAB — OSMOLALITY: Osmolality: 299 mOsm/kg (ref 275–300)

## 2012-12-11 LAB — GLUCOSE, CAPILLARY: Glucose-Capillary: 149 mg/dL — ABNORMAL HIGH (ref 70–99)

## 2012-12-11 LAB — CREATININE, URINE, RANDOM: Creatinine, Urine: 81.15 mg/dL

## 2012-12-11 LAB — SODIUM, URINE, RANDOM: Sodium, Ur: 49 mEq/L

## 2012-12-11 MED ORDER — POTASSIUM CHLORIDE CRYS ER 20 MEQ PO TBCR
40.0000 meq | EXTENDED_RELEASE_TABLET | Freq: Four times a day (QID) | ORAL | Status: AC
  Administered 2012-12-11 (×2): 40 meq via ORAL
  Filled 2012-12-11: qty 2

## 2012-12-11 MED ORDER — HYDRALAZINE HCL 25 MG PO TABS
25.0000 mg | ORAL_TABLET | Freq: Three times a day (TID) | ORAL | Status: DC
  Administered 2012-12-11 – 2012-12-12 (×3): 25 mg via ORAL
  Filled 2012-12-11 (×7): qty 1

## 2012-12-11 NOTE — Progress Notes (Signed)
Attempted to get IV access on pt.  I tried twice and was unsuccessful.  Pt tolerated well.  IV team was called to initiate an IV for this pt.

## 2012-12-11 NOTE — Progress Notes (Signed)
Got pt up to chair.  Pt instructed to try to cough up secretions when possible.

## 2012-12-11 NOTE — Progress Notes (Signed)
Patient is still resting comfortably with CPAP machine, will wait to administer flutter. Flutter is still scheduled for Q4 while awake. Patient should get flutter at next scheduled treatment time. RT will continue to assist as needed.

## 2012-12-11 NOTE — Progress Notes (Signed)
Placed pt. On cpap as per order. Pt. Is tolerating well at this time. 

## 2012-12-11 NOTE — Progress Notes (Signed)
  Echocardiogram 2D Echocardiogram has been performed.  Jorje Guild 12/11/2012, 8:53 AM

## 2012-12-11 NOTE — Progress Notes (Signed)
[facility]                                                                                Patient Demographics  Christian Morris, is a 72 y.o. male, DOB - 1940/05/07, WUJ:[id number]  Admit date - 12/09/2012   Admitting Physician No admitting provider for patient encounter.  Outpatient Primary MD for the patient is Christian Fillers., NP  LOS - 2   Chief Complaint  Patient presents with  . Shortness of Breath        Assessment & Plan    SIRS (systemic inflammatory response syndrome) due to HCAP Currently on broad-spectrum antibiotics have added azithromycin for atypical coverage for now will continue with Tamiflu, pending influenza PCR, he still quite hypoxic which ABGs prove, oxygen supplementation increased, added pulmonary toiletry along with chest physiotherapy, there was also some element of vascular congestion, he responded very well to one dose Lasix and today looks remarkably better. Echogram pending. Hold any IV fluids     DIABETES MELLITUS, TYPE II  hold metformin and Actos. Place on sliding scale insulin , will add lantus  Lab Results  Component Value Date   HGBA1C 9.0* 10/14/2012   CBG (last 3)   Recent Labs  12/10/12 1609 12/10/12 2111 12/11/12 0610  GLUCAP 157* 164* 149*       SLEEP APNEA, OBSTRUCTIVE  continue nighttime CPAP      HYPERTENSION  stable. Continue home medications     CHRONIC OBSTRUCTIVE PULMONARY DISEASE  Will order albuterol and Atrovent nebs , no wheezing on exam no need for steroids.     GERD  Continue PPI      BPH (benign prostatic hyperplasia)  Continue Flomax     ARF on CKD stage II baseline creatinine between 1.2 and 1.5  We'll hold home dose ARB and monitor. Check urine electrolytes      Code Status: full  Family Communication:    Disposition Plan: home   Procedures Echo, CT chest   Consults      Medications  Scheduled Meds: . albuterol  2.5 mg Nebulization QID   And  .  ipratropium  0.5 mg Nebulization QID  . aspirin EC  81 mg Oral Daily  . atorvastatin  40 mg Oral Daily  . azithromycin  500 mg Intravenous Q24H  . ceFEPime (MAXIPIME) IV  1 g Intravenous Q8H  . diltiazem  180 mg Oral Daily  . enoxaparin (LOVENOX) injection  40 mg Subcutaneous Q24H  . hydrALAZINE  25 mg Oral Q8H  . hydrochlorothiazide  25 mg Oral Daily  . insulin aspart  0-15 Units Subcutaneous TID WC  . insulin glargine  16 Units Subcutaneous QHS  . omega-3 acid ethyl esters  2 g Oral BID  . oseltamivir  75 mg Oral BID  . pantoprazole  40 mg Oral Daily  . PARoxetine  30 mg Oral Daily  . potassium chloride  40 mEq Oral Q6H  . tamsulosin  0.4 mg Oral Daily  . vancomycin  1,750 mg Intravenous Q24H   Continuous Infusions:  PRN Meds:.acetaminophen, diazepam, HYDROcodone-acetaminophen, nitroGLYCERIN  DVT Prophylaxis  Lovenox    Lab Results  Component Value Date   PLT  194 12/11/2012    Antibiotics     Anti-infectives   Start     Dose/Rate Route Frequency Ordered Stop   12/10/12 1800  vancomycin (VANCOCIN) 1,750 mg in sodium chloride 0.9 % 500 mL IVPB     1,750 mg 250 mL/hr over 120 Minutes Intravenous Every 24 hours 12/09/12 2129     12/10/12 1000  azithromycin (ZITHROMAX) 500 mg in dextrose 5 % 250 mL IVPB     500 mg 250 mL/hr over 60 Minutes Intravenous Every 24 hours 12/10/12 0936     12/10/12 0700  vancomycin (VANCOCIN) 1,250 mg in sodium chloride 0.9 % 250 mL IVPB  Status:  Discontinued     1,250 mg 166.7 mL/hr over 90 Minutes Intravenous Every 12 hours 12/09/12 1824 12/09/12 2129   12/10/12 0200  ceFEPIme (MAXIPIME) 1 g in dextrose 5 % 50 mL IVPB     1 g 100 mL/hr over 30 Minutes Intravenous 3 times per day 12/09/12 1824     12/09/12 2200  ceFEPIme (MAXIPIME) 1 g in dextrose 5 % 50 mL IVPB  Status:  Discontinued     1 g 100 mL/hr over 30 Minutes Intravenous 3 times per day 12/09/12 2050 12/09/12 2053   12/09/12 2200  oseltamivir (TAMIFLU) capsule 75 mg     75 mg Oral 2  times daily 12/09/12 2050 12/14/12 2159   12/09/12 1830  vancomycin (VANCOCIN) 2,500 mg in sodium chloride 0.9 % 500 mL IVPB     2,500 mg 250 mL/hr over 120 Minutes Intravenous  Once 12/09/12 1824 12/09/12 2137   12/09/12 1815  ceFEPIme (MAXIPIME) 1 g in dextrose 5 % 50 mL IVPB     1 g 100 mL/hr over 30 Minutes Intravenous  Once 12/09/12 1813 12/09/12 1935          Subjective:   Christian Morris today has, No headache, No chest pain, No abdominal pain - No Nausea, No new weakness tingling or numbness, + Cough & SOB.    Objective:   Filed Vitals:   12/10/12 2114 12/10/12 2126 12/10/12 2155 12/11/12 0453  BP: 99/46  99/46 113/52  Pulse: 95  95 57  Temp: 98.7 F (37.1 C)   97.4 F (36.3 C)  TempSrc: Oral   Axillary  Resp: 18  17 17   Height:      Weight:      SpO2: 95% 95% 95% 95%    Wt Readings from Last 3 Encounters:  12/09/12 125.42 kg (276 lb 8 oz)  11/15/12 135.226 kg (298 lb 1.9 oz)  11/02/12 131.543 kg (290 lb)     Intake/Output Summary (Last 24 hours) at 12/11/12 0935 Last data filed at 12/11/12 0455  Gross per 24 hour  Intake    240 ml  Output   1675 ml  Net  -1435 ml    Exam Awake Alert, Oriented X 3, No new F.N deficits, Normal affect Christian Morris,PERRAL Supple Neck,No JVD, No cervical lymphadenopathy appriciated.  Symmetrical Chest wall movement, Good air movement bilaterally, Coarse B sounds RRR,No Gallops,Rubs or new Murmurs, No Parasternal Heave +ve B.Sounds, Abd Soft, Non tender, No organomegaly appriciated, No rebound - guarding or rigidity. No Cyanosis, Clubbing or edema, No new Rash or bruise      Data Review   Micro Results Recent Results (from the past 240 hour(s))  CULTURE, BLOOD (ROUTINE X 2)     Status: None   Collection Time    12/09/12  3:30 PM      Result  Value Range Status   Specimen Description BLOOD ARM RIGHT   Final   Special Requests BOTTLES DRAWN AEROBIC AND ANAEROBIC 10CC   Final   Culture  Setup Time     Final   Value:  12/09/2012 21:04     Performed at [facility]   Culture     Final   Value:        BLOOD CULTURE RECEIVED NO GROWTH TO DATE CULTURE WILL BE HELD FOR 5 DAYS BEFORE ISSUING A FINAL NEGATIVE REPORT     Performed at [facility]   Report Status PENDING   Incomplete  CULTURE, BLOOD (ROUTINE X 2)     Status: None   Collection Time    12/09/12  3:35 PM      Result Value Range Status   Specimen Description BLOOD HAND RIGHT   Final   Special Requests BOTTLES DRAWN AEROBIC ONLY 10CC   Final   Culture  Setup Time     Final   Value: 12/09/2012 21:04     Performed at [facility]   Culture     Final   Value:        BLOOD CULTURE RECEIVED NO GROWTH TO DATE CULTURE WILL BE HELD FOR 5 DAYS BEFORE ISSUING A FINAL NEGATIVE REPORT     Performed at [facility]   Report Status PENDING   Incomplete  CULTURE, EXPECTORATED SPUTUM-ASSESSMENT     Status: None   Collection Time    12/10/12  9:17 AM      Result Value Range Status   Specimen Description SPUTUM   Final   Special Requests Normal   Final   Sputum evaluation     Final   Value: THIS SPECIMEN IS ACCEPTABLE. RESPIRATORY CULTURE REPORT TO FOLLOW.   Report Status 12/10/2012 FINAL   Final    Radiology Reports Dg Chest 2 View  12/10/2012   CLINICAL DATA:  Respiratory failure and pneumonia  EXAM: CHEST  2 VIEW  COMPARISON:  12/09/2012  FINDINGS: The heart size and mediastinal contours are within normal limits. The lung volumes appear low. There is pulmonary vascular congestion but no overt edema. No airspace consolidation identified. The visualized skeletal structures are unremarkable.  IMPRESSION: 1. Low lung volumes and pulmonary vascular congestion.   Electronically Signed   By: Signa Kell M.D.   On: 12/10/2012 10:17   Dg Chest 2 View (if Patient Has Fever And/or Copd)  12/09/2012   CLINICAL DATA:  72 year old male with weakness chest pain and shortness of Breath. Initial encounter.  EXAM: CHEST  2 VIEW  COMPARISON:   CT Abdomen and Pelvis 10/2012 and earlier.  FINDINGS: Semi upright AP and lateral views of the chest. Large body habitus with lordotic AP view and mildly lower lung volumes. Stable cardiac size and mediastinal contours. No pneumothorax, pulmonary edema, pleural effusion or definite acute  pulmonary opacity.  No acute osseous abnormality identified.  IMPRESSION: No acute cardiopulmonary abnormality.   Electronically Signed   By: Augusto Gamble M.D.   On: 12/09/2012 15:16   Dg Lumbar Spine Complete  12/07/2012   CLINICAL DATA:  Lower back pain  EXAM: LUMBAR SPINE - COMPLETE 4+ VIEW  COMPARISON:  10/24/2012 CT  FINDINGS: Multilevel degenerative changes, most severe at L4-5 and L5-S1. There is grade 1 anterolisthesis of L5 on S1 and L5 pars defects are noted. Lower thoracic/ thoracolumbar degenerative changes are again noted. There is mild wedge deformity of the T11 vertebral body anteriorly,  remote. No acute fracture. No dislocation. Atherosclerotic vascular calcifications. Overlying soft tissues otherwise unremarkable.  IMPRESSION: Multilevel degenerative changes, most severe at L5-S1.  L5 pars defects and grade 1 anterolisthesis of L5 on S1.  No acute osseous finding.   Electronically Signed   By: Jearld Lesch M.D.   On: 12/07/2012 00:57   Ct Head Wo Contrast  12/07/2012   CLINICAL DATA:  Weakness, headache  EXAM: CT HEAD WITHOUT CONTRAST  CT CERVICAL SPINE WITHOUT CONTRAST  TECHNIQUE: Multidetector CT imaging of the head and cervical spine was performed following the standard protocol without intravenous contrast. Multiplanar CT image reconstructions of the cervical spine were also generated.  COMPARISON:  None available  FINDINGS: CT HEAD FINDINGS  Diffuse prominence of the CSF containing spaces is compatible with moderately age-related atrophy. Scattered and confluent hypodensity within the periventricular and deep white matter is most consistent with chronic small vessel ischemic change.  No acute large  vessel territory infarct identified. There is no acute intracranial hemorrhage. No mass or midline shift. Gray-white matter differentiation is preserved. No extra-axial fluid collection.  Calvarium is intact. Orbital soft tissues are within normal limits. Paranasal sinuses and mastoid air cells are clear.  CT CERVICAL SPINE FINDINGS  There is slight reversal of the normal cervical lordosis. Trace anterolisthesis of C3 and C4 is present. Vertebral body heights are preserved. Normal C1-2 articulations are intact. There is no definite prevertebral soft tissue swelling.  Multilevel degenerative disc disease as evidenced by intervertebral disk space narrowing, endplate sclerosis, and endplate osteophytosis is seen, most severe at C5-6.  A 1.0 x 0.9 cm sclerotic focus within the right aspect of the C2 vertebral body is noted, of uncertain clinical significance. No other focal osseous lesions identified.  Visualized soft tissues of the neck are within normal limits. No definite apical pneumothorax.  IMPRESSION: CT BRAIN:  1. No acute intracranial process identified. 2. Mild age-related atrophy and chronic microvascular ischemic changes.  CT CERVICAL SPINE:  1. No acute fracture or listhesis identified within the cervical spine. 2. Multilevel degenerative disc disease, most severe at C5-6. 3. Sclerotic lesion within the right aspect of the C2 vertebral body. A benign bone island is favored. This finding is of doubtful clinical significance.   Electronically Signed   By: Rise Mu M.D.   On: 12/07/2012 00:55   Ct Chest Wo Contrast  12/09/2012   CLINICAL DATA:  Shortness of breath.  Fever.  Evaluate for pneumonia  EXAM: CT CHEST WITHOUT CONTRAST  TECHNIQUE: Multidetector CT imaging of the chest was performed following the standard protocol without IV contrast.  COMPARISON:  12/09/2012  FINDINGS: No pleural effusion identified. There is a calcified granuloma identified within the posterior right lung base.  Peripheral area of consolidation is identified within the anterior basal portion of the right upper lobe, image 45/ series 6. The remaining portions of the lungs are clear. The heart size appears mildly enlarged. There are calcifications involving the thoracic aorta, LAD coronary arteries. No enlarged mediastinal or hilar lymph nodes identified. No axillary or supraclavicular adenopathy identified.  Incidental imaging through the upper abdomen is on unremarkable. No aggressive lytic or sclerotic bone lesions identified. Review of the visualized osseous structures is significant for mild multilevel spondylosis.  IMPRESSION: 1. There is a patchy nonspecific area of peripheral consolidation within the anterior basilar portion of the right upper lobe. This may represent early or resolving pneumonia.  2.  Prior granulomatous disease  3. Calcified atherosclerotic disease including multi vessel coronary  artery calcifications.   Electronically Signed   By: Signa Kell M.D.   On: 12/09/2012 17:44   Ct Cervical Spine Wo Contrast  12/07/2012   CLINICAL DATA:  Weakness, headache  EXAM: CT HEAD WITHOUT CONTRAST  CT CERVICAL SPINE WITHOUT CONTRAST  TECHNIQUE: Multidetector CT imaging of the head and cervical spine was performed following the standard protocol without intravenous contrast. Multiplanar CT image reconstructions of the cervical spine were also generated.  COMPARISON:  None available  FINDINGS: CT HEAD FINDINGS  Diffuse prominence of the CSF containing spaces is compatible with moderately age-related atrophy. Scattered and confluent hypodensity within the periventricular and deep white matter is most consistent with chronic small vessel ischemic change.  No acute large vessel territory infarct identified. There is no acute intracranial hemorrhage. No mass or midline shift. Gray-white matter differentiation is preserved. No extra-axial fluid collection.  Calvarium is intact. Orbital soft tissues are within normal  limits. Paranasal sinuses and mastoid air cells are clear.  CT CERVICAL SPINE FINDINGS  There is slight reversal of the normal cervical lordosis. Trace anterolisthesis of C3 and C4 is present. Vertebral body heights are preserved. Normal C1-2 articulations are intact. There is no definite prevertebral soft tissue swelling.  Multilevel degenerative disc disease as evidenced by intervertebral disk space narrowing, endplate sclerosis, and endplate osteophytosis is seen, most severe at C5-6.  A 1.0 x 0.9 cm sclerotic focus within the right aspect of the C2 vertebral body is noted, of uncertain clinical significance. No other focal osseous lesions identified.  Visualized soft tissues of the neck are within normal limits. No definite apical pneumothorax.  IMPRESSION: CT BRAIN:  1. No acute intracranial process identified. 2. Mild age-related atrophy and chronic microvascular ischemic changes.  CT CERVICAL SPINE:  1. No acute fracture or listhesis identified within the cervical spine. 2. Multilevel degenerative disc disease, most severe at C5-6. 3. Sclerotic lesion within the right aspect of the C2 vertebral body. A benign bone island is favored. This finding is of doubtful clinical significance.   Electronically Signed   By: Rise Mu M.D.   On: 12/07/2012 00:55    CBC  Recent Labs Lab 12/07/12 0024 12/09/12 1440 12/10/12 0420 12/11/12 0507  WBC 12.1* 11.9* 14.2* 12.7*  HGB 12.9* 13.8 13.2 11.9*  HCT 38.9* 40.6 40.1 36.2*  PLT 234 235 217 194  MCV 87.6 87.3 87.7 87.7  MCH 29.1 29.7 28.9 28.8  MCHC 33.2 34.0 32.9 32.9  RDW 13.7 14.0 14.1 14.2  LYMPHSABS 0.6*  --   --   --   MONOABS 1.3*  --   --   --   EOSABS 0.0  --   --   --   BASOSABS 0.1  --   --   --     Chemistries   Recent Labs Lab 12/07/12 0024 12/09/12 1440 12/10/12 0420 12/11/12 0507  NA 132* 136 137 139  K 4.1 3.8 3.6 2.9*  CL 92* 95* 97 99  CO2 24 24 26 25   GLUCOSE 212* 190* 195* 158*  BUN 31* 37* 30* 36*   CREATININE 1.07 1.29 1.15 1.52*  CALCIUM 9.8 9.5 8.9 8.5  AST 38*  --   --   --   ALT 23  --   --   --   ALKPHOS 71  --   --   --   BILITOT 0.5  --   --   --    ------------------------------------------------------------------------------------------------------------------ estimated creatinine clearance is 58.3 ml/min (by C-G  formula based on Cr of 1.52). ------------------------------------------------------------------------------------------------------------------ No results found for this basename: HGBA1C,  in the last 72 hours ------------------------------------------------------------------------------------------------------------------ No results found for this basename: CHOL, HDL, LDLCALC, TRIG, CHOLHDL, LDLDIRECT,  in the last 72 hours ------------------------------------------------------------------------------------------------------------------ No results found for this basename: TSH, T4TOTAL, FREET3, T3FREE, THYROIDAB,  in the last 72 hours ------------------------------------------------------------------------------------------------------------------ No results found for this basename: VITAMINB12, FOLATE, FERRITIN, TIBC, IRON, RETICCTPCT,  in the last 72 hours  Coagulation profile No results found for this basename: INR, PROTIME,  in the last 168 hours  No results found for this basename: DDIMER,  in the last 72 hours  Cardiac Enzymes  Recent Labs Lab 12/07/12 0024 12/09/12 1449  TROPONINI <0.30 <0.30   ------------------------------------------------------------------------------------------------------------------ No components found with this basename: POCBNP,      Time Spent in minutes  35   Genene Kilman K M.D on 12/11/2012 at 9:35 AM  Between 7am to 7pm - Pager - [phone]  After 7pm go to [email or web address] - password [email or web address]  And look for the night coverage person covering for me after hours  [facility] Office  [phone]

## 2012-12-12 ENCOUNTER — Telehealth: Payer: Self-pay | Admitting: Family

## 2012-12-12 LAB — BASIC METABOLIC PANEL
BUN: 31 mg/dL — ABNORMAL HIGH (ref 6–23)
CO2: 24 mEq/L (ref 19–32)
Chloride: 99 mEq/L (ref 96–112)
Creatinine, Ser: 1.2 mg/dL (ref 0.50–1.35)
Glucose, Bld: 174 mg/dL — ABNORMAL HIGH (ref 70–99)
Potassium: 3.1 mEq/L — ABNORMAL LOW (ref 3.5–5.1)
Sodium: 134 mEq/L — ABNORMAL LOW (ref 135–145)

## 2012-12-12 LAB — LEGIONELLA ANTIGEN, URINE: Legionella Antigen, Urine: NEGATIVE

## 2012-12-12 LAB — CBC
HCT: 35.7 % — ABNORMAL LOW (ref 39.0–52.0)
Hemoglobin: 11.9 g/dL — ABNORMAL LOW (ref 13.0–17.0)
MCH: 28.9 pg (ref 26.0–34.0)
MCV: 86.7 fL (ref 78.0–100.0)
RBC: 4.12 MIL/uL — ABNORMAL LOW (ref 4.22–5.81)
WBC: 9 10*3/uL (ref 4.0–10.5)

## 2012-12-12 LAB — GLUCOSE, CAPILLARY
Glucose-Capillary: 145 mg/dL — ABNORMAL HIGH (ref 70–99)
Glucose-Capillary: 179 mg/dL — ABNORMAL HIGH (ref 70–99)

## 2012-12-12 MED ORDER — AMOXICILLIN-POT CLAVULANATE 875-125 MG PO TABS: 1.0000 | ORAL_TABLET | Freq: Two times a day (BID) | ORAL | Status: DC

## 2012-12-12 MED ORDER — MAGNESIUM SULFATE IN D5W 10-5 MG/ML-% IV SOLN
1.0000 g | Freq: Once | INTRAVENOUS | Status: AC
  Administered 2012-12-12: 1 g via INTRAVENOUS
  Filled 2012-12-12: qty 100

## 2012-12-12 MED ORDER — AZITHROMYCIN 250 MG PO TABS: 250.0000 mg | ORAL_TABLET | Freq: Every day | ORAL | Status: DC

## 2012-12-12 MED ORDER — POTASSIUM CHLORIDE CRYS ER 20 MEQ PO TBCR
40.0000 meq | EXTENDED_RELEASE_TABLET | Freq: Four times a day (QID) | ORAL | Status: DC
  Administered 2012-12-12: 40 meq via ORAL
  Filled 2012-12-12: qty 2

## 2012-12-12 MED ORDER — POTASSIUM CHLORIDE CRYS ER 20 MEQ PO TBCR
40.0000 meq | EXTENDED_RELEASE_TABLET | Freq: Once | ORAL | Status: AC
  Administered 2012-12-12: 40 meq via ORAL
  Filled 2012-12-12: qty 2

## 2012-12-12 MED ORDER — AMOXICILLIN-POT CLAVULANATE 875-125 MG PO TABS
1.0000 | ORAL_TABLET | Freq: Two times a day (BID) | ORAL | Status: AC
  Administered 2012-12-12: 1 via ORAL
  Filled 2012-12-12: qty 1

## 2012-12-12 MED ORDER — AZITHROMYCIN 250 MG PO TABS
250.0000 mg | ORAL_TABLET | Freq: Once | ORAL | Status: AC
  Administered 2012-12-12: 250 mg via ORAL
  Filled 2012-12-12: qty 1

## 2012-12-12 NOTE — Care Management Note (Signed)
    Page 1 of 1   12/12/2012     4:58:16 PM   CARE MANAGEMENT NOTE 12/12/2012  Patient:  Christian Morris, Christian Morris   Account Number:  [id number]  Date Initiated:  12/12/2012  Documentation initiated by:  Braxxton Stoudt  Subjective/Objective Assessment:   PT ADM ON 12/09/12 WITH PNA.  PTA, PT INDEPENDENT,LIVES WITH FRIEND.     Action/Plan:   PT FOR DC HOME TODAY.  FRIEND TO PROVIDE CARE AT DC.  NO HOME NEEDS IDENTIFIED.   Anticipated DC Date:  12/12/2012   Anticipated DC Plan:  HOME/SELF CARE      DC Planning Services  CM consult      Choice offered to / List presented to:             Status of service:  Completed, signed off Medicare Important Message given?   (If response is "NO", the following Medicare IM given date fields will be blank) Date Medicare IM given:   Date Additional Medicare IM given:    Discharge Disposition:  HOME/SELF CARE  Per UR Regulation:  Reviewed for med. necessity/level of care/duration of stay  If discussed at Long Length of Stay Meetings, dates discussed:    Comments:

## 2012-12-12 NOTE — Evaluation (Signed)
Physical Therapy Evaluation Patient Details Name: Christian Morris MRN: [id number] DOB: 1940-03-29 Today's Date: 12/12/2012 Time: 4098-1191 PT Time Calculation (min): 22 min  PT Assessment / Plan / Recommendation History of Present Illness  72 year old obese male with history of hypertension, dyslipidemia, diabetes mellitus, GERD, atypical chest pain with recent cardiac cath with nonobstructive coronary artery disease presented to the ED with symptoms of progressive shortness of breath for past 4-5 days  Clinical Impression  Patient presents close to functional baseline without need for skilled PT at this time.  Reports very active with community fitness at [facility] walking around a track and using [employer] prior to illness.  Cautioned to return slowly to this activity as tolerated.    PT Assessment  Patent does not need any further PT services    Follow Up Recommendations  Supervision - Intermittent          Equipment Recommendations  None recommended by PT          Precautions / Restrictions Precautions Precaution Comments: reports has fallen from bed but not from standing   Pertinent Vitals/Pain 5/10 chronic back pain      Mobility  Bed Mobility Bed Mobility: Not assessed Details for Bed Mobility Assistance: sitting edge of bed Transfers Transfers: Sit to Stand;Stand to Sit Sit to Stand: 6: Modified independent (Device/Increase time);From bed;With upper extremity assist Stand to Sit: 6: Modified independent (Device/Increase time);To bed Ambulation/Gait Ambulation/Gait Assistance: 7: Independent Ambulation Distance (Feet): 250 Feet Assistive device: None Ambulation/Gait Assistance Details: demonstrated one loss of balance with initial turn out of the room with minguard for safety to recover.  Demonstrates appropriate speed and gradual improvement over time in turns and balance with walking. Gait Pattern: Step-through pattern;Trunk rotated posteriorly on right Stairs:  Yes Stairs Assistance: 5: Supervision Stairs Assistance Details (indicate cue type and reason): 5 Stair Management Technique: One rail Right;Forwards           PT Goals(Current goals can be found in the care plan section) Acute Rehab PT Goals PT Goal Formulation: No goals set, d/c therapy  Visit Information  Last PT Received On: 12/12/12 Assistance Needed: +1 History of Present Illness: 72 year old obese male with history of hypertension, dyslipidemia, diabetes mellitus, GERD, atypical chest pain with recent cardiac cath with nonobstructive coronary artery disease presented to the ED with symptoms of progressive shortness of breath for past 4-5 days       Prior Functioning  Home Living Family/patient expects to be discharged to:: Private residence Living Arrangements: Non-relatives/Friends Available Help at Discharge: Available PRN/intermittently Type of Home: House Home Access: Stairs to enter [employer] of Steps: 4 in back Entrance Stairs-Rails: Can reach both Home Layout: One level Home Equipment: None Prior Function Level of Independence: Independent Communication Communication: No difficulties Dominant Hand: Right    Cognition  Cognition Arousal/Alertness: Awake/alert Behavior During Therapy: WFL for tasks assessed/performed Overall Cognitive Status: Within Functional Limits for tasks assessed    Extremity/Trunk Assessment Lower Extremity Assessment Lower Extremity Assessment: Overall WFL for tasks assessed   Balance Balance Balance Assessed: Yes Dynamic Standing Balance Dynamic Standing - Balance Support: No upper extremity supported;During functional activity Dynamic Standing - Level of Assistance: 7: Independent;6: Modified independent (Device/Increase time) Dynamic Standing - Comments: occasional hand touching surface in room with activities  End of Session PT - End of Session Equipment Utilized During Treatment: Gait belt Activity Tolerance:  Patient tolerated treatment well Patient left: in bed;with family/visitor present  GP     [facility] 12/12/2012, 1:18  PM [place], [place] [phone] 12/12/2012

## 2012-12-12 NOTE — Telephone Encounter (Signed)
Please call pt to arrange a 1 week follow up.

## 2012-12-12 NOTE — Discharge Summary (Addendum)
[facility]                                                                                   ASAHD CAN, is a 72 y.o. male  DOB October 06, 1940  MRN [id number].  Admission date:  12/09/2012  Admitting Physician  No admitting provider for patient encounter.  Discharge Date:  12/12/2012   Primary MD  Lemont Fillers., NP   Recommendations for primary care physician for things to follow:   Kindly follow final sputum culture results.   Repeat CBC BMP in 2 days, repeat 2 view chest x-ray in 7-10 days     Admission Diagnosis  Type II or unspecified type diabetes mellitus without mention of complication, not stated as uncontrolled [250.00] Unspecified essential hypertension [401.9] SIRS (systemic inflammatory response syndrome) [995.90] HCAP (healthcare-associated pneumonia) [486]  Discharge Diagnosis   HCAP with early sepsis versus SIRs  Principal Problem:   SIRS (systemic inflammatory response syndrome) Active Problems:   DIABETES MELLITUS, TYPE II   Hyperlipidemia   ANXIETY DEPRESSION   SLEEP APNEA, OBSTRUCTIVE   HYPERTENSION   CHRONIC OBSTRUCTIVE PULMONARY DISEASE   GERD   PUD, HX OF   BPH (benign prostatic hyperplasia)   HCAP (healthcare-associated pneumonia)      Past Medical History  Diagnosis Date  . Arthritis   . Depression   . Diabetes mellitus   . Hypertension   . Hyperlipidemia   . History of gastric ulcer     Age 34- no recurrence since.  . Transfusion history     due to bleeding ulcer at age 36  . Hypogonadism male 01/11/2011  . Squamous cell carcinoma in situ of skin of forearm 01/23/2011  . Lung nodule     right 16 mm, seen initially 6/12. 13mm in 08/2011.  [name] GERD (gastroesophageal reflux disease)   . Headache(784.0)   . Fatty liver disease, nonalcoholic     Past Surgical History  Procedure Laterality Date  . Eye surgery  08/2009    cataract removal left eye--Dr Dione Booze  . Melanoma excision      left forearm     Discharge Condition:  Stable   Follow-up Information   Follow up with Lemont Fillers., NP. Schedule an appointment as soon as possible for a visit in 3 days.   Specialty:  Internal Medicine   Contact information:   [address] [place] [address] [phone]         Consults obtained - none   Discharge Medications      Medication List         amoxicillin-clavulanate 875-125 MG per tablet  Commonly known as:  AUGMENTIN  Take 1 tablet by mouth 2 (two) times daily.     aspirin EC 81 MG tablet  Take 81 mg by mouth daily.     atorvastatin 40 MG tablet  Commonly known as:  LIPITOR  Take 1 tablet (40 mg total) by mouth daily.     azithromycin 250 MG tablet  Commonly known as:  ZITHROMAX  Take 1 tablet (250 mg total) by mouth daily.     diazepam 10 MG tablet  Commonly known as:  VALIUM  Take 1 tablet (10 mg total) by mouth at bedtime as needed for anxiety.     diltiazem 180 MG 24 hr capsule  Commonly known as:  CARDIZEM CD  Take 1 capsule (180 mg total) by mouth daily.     fish oil-omega-3 fatty acids 1000 MG capsule  Take 2 g by mouth 2 (two) times daily.     hydrochlorothiazide 25 MG tablet  Commonly known as:  HYDRODIURIL  Take 1 tablet (25 mg total) by mouth daily.     HYDROcodone-acetaminophen 7.5-325 MG per tablet  Commonly known as:  NORCO  Take 1 tablet by mouth every 6 (six) hours as needed.     insulin glargine 100 units/mL Soln  Commonly known as:  LANTUS  Inject 16 Units into the skin at bedtime.     losartan 100 MG tablet  Commonly known as:  COZAAR  Take 100 mg by mouth daily.     metFORMIN 1000 MG tablet  Commonly known as:  GLUCOPHAGE  Take 1,000 mg by mouth daily.     nitroGLYCERIN 0.4 MG SL tablet  Commonly known as:  NITROSTAT  Place 1 tablet (0.4 mg total) under the tongue every 5 (five) minutes x 3 doses as needed for chest pain (donot take with viagra).     omeprazole 20 MG capsule  Commonly known as:  PRILOSEC  Take 1 capsule  (20 mg total) by mouth daily.     PARoxetine 30 MG tablet  Commonly known as:  PAXIL  Take 30 mg by mouth daily.     pioglitazone 30 MG tablet  Commonly known as:  ACTOS  Take 1 tablet (30 mg total) by mouth daily.     tamsulosin 0.4 MG Caps capsule  Commonly known as:  FLOMAX  Take 1 capsule (0.4 mg total) by mouth daily.         Diet and Activity recommendation: See Discharge Instructions below   Discharge Instructions     Follow with Primary MD Lemont Fillers., NP in 3 days   Get CBC, CMP, checked 3 days by Primary MD and again as instructed by your Primary MD. Get a 2 view Chest X ray done next visit.  Get Medicines reviewed and adjusted.  Please request your Prim.MD to go over all Hospital Tests and Procedure/Radiological results at the follow up, please get all Hospital records sent to your Prim MD by signing hospital release before you go home.  Activity: As tolerated with Full fall precautions use walker/cane & assistance as needed   Diet:  Heart Healthy - Low Carb  For Heart failure patients - Check your Weight same time everyday, if you gain over 2 pounds, or you develop in leg swelling, experience more shortness of breath or chest pain, call your Primary MD immediately. Follow Cardiac Low Salt Diet and 1.8 lit/day fluid restriction.  Disposition Home    If you experience worsening of your admission symptoms, develop shortness of breath, life threatening emergency, suicidal or homicidal thoughts you must seek medical attention immediately by calling 911 or calling your MD immediately  if symptoms less severe.  You Must read complete instructions/literature along with all the possible adverse reactions/side effects for all the Medicines you take and that have been prescribed to you. Take any new Medicines after you have completely understood and accpet all the possible adverse reactions/side effects.   Do not drive and provide baby sitting services if your  were admitted for syncope or siezures until you have  seen by Primary MD or a Neurologist and advised to do so again.  Do not drive when taking Pain medications.    Do not take more than prescribed Pain, Sleep and Anxiety Medications  Special Instructions: If you have smoked or chewed Tobacco  in the last 2 yrs please stop smoking, stop any regular Alcohol  and or any Recreational drug use.  Wear Seat belts while driving.   Please note  You were cared for by a hospitalist during your hospital stay. If you have any questions about your discharge medications or the care you received while you were in the hospital after you are discharged, you can call the unit and asked to speak with the hospitalist on call if the hospitalist that took care of you is not available. Once you are discharged, your primary care physician will handle any further medical issues. Please note that NO REFILLS for any discharge medications will be authorized once you are discharged, as it is imperative that you return to your primary care physician (or establish a relationship with a primary care physician if you do not have one) for your aftercare needs so that they can reassess your need for medications and monitor your lab values.   Major procedures and Radiology Reports - PLEASE review detailed and final reports for all details, in brief -       Dg Chest 2 View  12/10/2012   CLINICAL DATA:  Respiratory failure and pneumonia  EXAM: CHEST  2 VIEW  COMPARISON:  12/09/2012  FINDINGS: The heart size and mediastinal contours are within normal limits. The lung volumes appear low. There is pulmonary vascular congestion but no overt edema. No airspace consolidation identified. The visualized skeletal structures are unremarkable.  IMPRESSION: 1. Low lung volumes and pulmonary vascular congestion.   Electronically Signed   By: Signa Kell M.D.   On: 12/10/2012 10:17   Dg Chest 2 View (if Patient Has Fever And/or  Copd)  12/09/2012   CLINICAL DATA:  72 year old male with weakness chest pain and shortness of Breath. Initial encounter.  EXAM: CHEST  2 VIEW  COMPARISON:  CT Abdomen and Pelvis 10/2012 and earlier.  FINDINGS: Semi upright AP and lateral views of the chest. Large body habitus with lordotic AP view and mildly lower lung volumes. Stable cardiac size and mediastinal contours. No pneumothorax, pulmonary edema, pleural effusion or definite acute  pulmonary opacity.  No acute osseous abnormality identified.  IMPRESSION: No acute cardiopulmonary abnormality.   Electronically Signed   By: Augusto Gamble M.D.   On: 12/09/2012 15:16   Dg Lumbar Spine Complete  12/07/2012   CLINICAL DATA:  Lower back pain  EXAM: LUMBAR SPINE - COMPLETE 4+ VIEW  COMPARISON:  10/24/2012 CT  FINDINGS: Multilevel degenerative changes, most severe at L4-5 and L5-S1. There is grade 1 anterolisthesis of L5 on S1 and L5 pars defects are noted. Lower thoracic/ thoracolumbar degenerative changes are again noted. There is mild wedge deformity of the T11 vertebral body anteriorly, remote. No acute fracture. No dislocation. Atherosclerotic vascular calcifications. Overlying soft tissues otherwise unremarkable.  IMPRESSION: Multilevel degenerative changes, most severe at L5-S1.  L5 pars defects and grade 1 anterolisthesis of L5 on S1.  No acute osseous finding.   Electronically Signed   By: Jearld Lesch M.D.   On: 12/07/2012 00:57   Ct Head Wo Contrast  12/07/2012   CLINICAL DATA:  Weakness, headache  EXAM: CT HEAD WITHOUT CONTRAST  CT CERVICAL SPINE WITHOUT CONTRAST  TECHNIQUE:  Multidetector CT imaging of the head and cervical spine was performed following the standard protocol without intravenous contrast. Multiplanar CT image reconstructions of the cervical spine were also generated.  COMPARISON:  None available  FINDINGS: CT HEAD FINDINGS  Diffuse prominence of the CSF containing spaces is compatible with moderately age-related atrophy. Scattered  and confluent hypodensity within the periventricular and deep white matter is most consistent with chronic small vessel ischemic change.  No acute large vessel territory infarct identified. There is no acute intracranial hemorrhage. No mass or midline shift. Gray-white matter differentiation is preserved. No extra-axial fluid collection.  Calvarium is intact. Orbital soft tissues are within normal limits. Paranasal sinuses and mastoid air cells are clear.  CT CERVICAL SPINE FINDINGS  There is slight reversal of the normal cervical lordosis. Trace anterolisthesis of C3 and C4 is present. Vertebral body heights are preserved. Normal C1-2 articulations are intact. There is no definite prevertebral soft tissue swelling.  Multilevel degenerative disc disease as evidenced by intervertebral disk space narrowing, endplate sclerosis, and endplate osteophytosis is seen, most severe at C5-6.  A 1.0 x 0.9 cm sclerotic focus within the right aspect of the C2 vertebral body is noted, of uncertain clinical significance. No other focal osseous lesions identified.  Visualized soft tissues of the neck are within normal limits. No definite apical pneumothorax.  IMPRESSION: CT BRAIN:  1. No acute intracranial process identified. 2. Mild age-related atrophy and chronic microvascular ischemic changes.  CT CERVICAL SPINE:  1. No acute fracture or listhesis identified within the cervical spine. 2. Multilevel degenerative disc disease, most severe at C5-6. 3. Sclerotic lesion within the right aspect of the C2 vertebral body. A benign bone island is favored. This finding is of doubtful clinical significance.   Electronically Signed   By: Rise Mu M.D.   On: 12/07/2012 00:55   Ct Chest Wo Contrast  12/09/2012   CLINICAL DATA:  Shortness of breath.  Fever.  Evaluate for pneumonia  EXAM: CT CHEST WITHOUT CONTRAST  TECHNIQUE: Multidetector CT imaging of the chest was performed following the standard protocol without IV contrast.   COMPARISON:  12/09/2012  FINDINGS: No pleural effusion identified. There is a calcified granuloma identified within the posterior right lung base. Peripheral area of consolidation is identified within the anterior basal portion of the right upper lobe, image 45/ series 6. The remaining portions of the lungs are clear. The heart size appears mildly enlarged. There are calcifications involving the thoracic aorta, LAD coronary arteries. No enlarged mediastinal or hilar lymph nodes identified. No axillary or supraclavicular adenopathy identified.  Incidental imaging through the upper abdomen is on unremarkable. No aggressive lytic or sclerotic bone lesions identified. Review of the visualized osseous structures is significant for mild multilevel spondylosis.  IMPRESSION: 1. There is a patchy nonspecific area of peripheral consolidation within the anterior basilar portion of the right upper lobe. This may represent early or resolving pneumonia.  2.  Prior granulomatous disease  3. Calcified atherosclerotic disease including multi vessel coronary artery calcifications.   Electronically Signed   By: Signa Kell M.D.   On: 12/09/2012 17:44   Ct Cervical Spine Wo Contrast  12/07/2012   CLINICAL DATA:  Weakness, headache  EXAM: CT HEAD WITHOUT CONTRAST  CT CERVICAL SPINE WITHOUT CONTRAST  TECHNIQUE: Multidetector CT imaging of the head and cervical spine was performed following the standard protocol without intravenous contrast. Multiplanar CT image reconstructions of the cervical spine were also generated.  COMPARISON:  None available  FINDINGS: CT  HEAD FINDINGS  Diffuse prominence of the CSF containing spaces is compatible with moderately age-related atrophy. Scattered and confluent hypodensity within the periventricular and deep white matter is most consistent with chronic small vessel ischemic change.  No acute large vessel territory infarct identified. There is no acute intracranial hemorrhage. No mass or midline  shift. Gray-white matter differentiation is preserved. No extra-axial fluid collection.  Calvarium is intact. Orbital soft tissues are within normal limits. Paranasal sinuses and mastoid air cells are clear.  CT CERVICAL SPINE FINDINGS  There is slight reversal of the normal cervical lordosis. Trace anterolisthesis of C3 and C4 is present. Vertebral body heights are preserved. Normal C1-2 articulations are intact. There is no definite prevertebral soft tissue swelling.  Multilevel degenerative disc disease as evidenced by intervertebral disk space narrowing, endplate sclerosis, and endplate osteophytosis is seen, most severe at C5-6.  A 1.0 x 0.9 cm sclerotic focus within the right aspect of the C2 vertebral body is noted, of uncertain clinical significance. No other focal osseous lesions identified.  Visualized soft tissues of the neck are within normal limits. No definite apical pneumothorax.  IMPRESSION: CT BRAIN:  1. No acute intracranial process identified. 2. Mild age-related atrophy and chronic microvascular ischemic changes.  CT CERVICAL SPINE:  1. No acute fracture or listhesis identified within the cervical spine. 2. Multilevel degenerative disc disease, most severe at C5-6. 3. Sclerotic lesion within the right aspect of the C2 vertebral body. A benign bone island is favored. This finding is of doubtful clinical significance.   Electronically Signed   By: Rise Mu M.D.   On: 12/07/2012 00:55     Echo  - Left ventricle: The cavity size was normal. Wall thickness was increased in a pattern of moderate LVH. Systolic function was normal. The estimated ejection fraction was in the range of 60% to 65%. Wall motion was normal; there were no regional wall motion abnormalities.    Micro Results      Recent Results (from the past 240 hour(s))  CULTURE, BLOOD (ROUTINE X 2)     Status: None   Collection Time    12/09/12  3:30 PM      Result Value Range Status   Specimen Description  BLOOD ARM RIGHT   Final   Special Requests BOTTLES DRAWN AEROBIC AND ANAEROBIC 10CC   Final   Culture  Setup Time     Final   Value: 12/09/2012 21:04     Performed at [facility]   Culture     Final   Value:        BLOOD CULTURE RECEIVED NO GROWTH TO DATE CULTURE WILL BE HELD FOR 5 DAYS BEFORE ISSUING A FINAL NEGATIVE REPORT     Performed at [facility]   Report Status PENDING   Incomplete  CULTURE, BLOOD (ROUTINE X 2)     Status: None   Collection Time    12/09/12  3:35 PM      Result Value Range Status   Specimen Description BLOOD HAND RIGHT   Final   Special Requests BOTTLES DRAWN AEROBIC ONLY 10CC   Final   Culture  Setup Time     Final   Value: 12/09/2012 21:04     Performed at [facility]   Culture     Final   Value:        BLOOD CULTURE RECEIVED NO GROWTH TO DATE CULTURE WILL BE HELD FOR 5 DAYS BEFORE ISSUING A FINAL NEGATIVE REPORT  Performed at [facility]   Report Status PENDING   Incomplete  CULTURE, BLOOD (ROUTINE X 2)     Status: None   Collection Time    12/09/12 11:20 PM      Result Value Range Status   Specimen Description BLOOD RIGHT HAND   Final   Special Requests BOTTLES DRAWN AEROBIC ONLY 10CC   Final   Culture  Setup Time     Final   Value: 12/10/2012 05:49     Performed at [facility]   Culture     Final   Value:        BLOOD CULTURE RECEIVED NO GROWTH TO DATE CULTURE WILL BE HELD FOR 5 DAYS BEFORE ISSUING A FINAL NEGATIVE REPORT     Performed at [facility]   Report Status PENDING   Incomplete  CULTURE, BLOOD (ROUTINE X 2)     Status: None   Collection Time    12/09/12 11:25 PM      Result Value Range Status   Specimen Description BLOOD LEFT HAND   Final   Special Requests BOTTLES DRAWN AEROBIC ONLY 10CC   Final   Culture  Setup Time     Final   Value: 12/10/2012 05:49     Performed at [facility]   Culture     Final   Value:        BLOOD CULTURE RECEIVED NO GROWTH TO DATE CULTURE  WILL BE HELD FOR 5 DAYS BEFORE ISSUING A FINAL NEGATIVE REPORT     Performed at [facility]   Report Status PENDING   Incomplete  CULTURE, EXPECTORATED SPUTUM-ASSESSMENT     Status: None   Collection Time    12/10/12  9:17 AM      Result Value Range Status   Specimen Description SPUTUM   Final   Special Requests Normal   Final   Sputum evaluation     Final   Value: THIS SPECIMEN IS ACCEPTABLE. RESPIRATORY CULTURE REPORT TO FOLLOW.   Report Status 12/10/2012 FINAL   Final  CULTURE, RESPIRATORY (NON-EXPECTORATED)     Status: None   Collection Time    12/10/12  9:17 AM      Result Value Range Status   Specimen Description SPUTUM   Final   Special Requests NONE   Final   Gram Stain     Final   Value: ABUNDANT WBC PRESENT, PREDOMINANTLY PMN     FEW SQUAMOUS EPITHELIAL CELLS PRESENT     ABUNDANT GRAM POSITIVE COCCI IN PAIRS     IN CLUSTERS IN CHAINS FEW GRAM NEGATIVE RODS     Performed at [facility]   Culture     Final   Value: ABUNDANT STAPHYLOCOCCUS AUREUS     Note: RIFAMPIN AND GENTAMICIN SHOULD NOT BE USED AS SINGLE DRUGS FOR TREATMENT OF STAPH INFECTIONS.     Performed at [facility]   Report Status PENDING   Incomplete     History of present illness and  Hospital Course:     Kindly see H&P for history of present illness and admission details, please review complete Labs, Consult reports and Test reports for all details in brief Christian Morris, is a 72 y.o. male, patient with history of  obstructive sleep apnea uses CPAP at night, diabetes mellitus type 2, hypertension, dyslipidemia, GERD, CAD with nonobstructive coronary artery disease per recent heart cath presented to the hospital with chief complaints of cough fevers and shortness  of breath secondary to HCAP.   He was admitted to the hospital, initially he had signs of early sepsis versus SIRS, he was treated with some IV fluids, empiric IV antibiotics which included vancomycin and cefepime along  with azithromycin. He also received chest physiotherapy and pulmonary toiletry with aggressive suctioning, all with excellent results. Today he is completely symptom free, he is afebrile, leukocytosis has resolved, he has no cough or shortness of breath and feels at his baseline. He will be placed on 5 more days of oral antibiotics and sent home. His blood cultures are negative to date. His sputum cultures are still preliminary and will request primary care physician to kindly monitor her final sputum culture results next visit.    For his diabetes mellitus, hypertension, dyslipidemia and GERD patient will commence his home medications unchanged.    Slightly low potassium today which has been aggressively replaced. We'll request PCP to repeat CBC BMP in 2 days time.   Echogram EF is preserved at 55%. Moderate LVH suggesting chronic diastolic dysfunction.      Today   Subjective:   Christian Morris today has no headache,no chest abdominal pain,no new weakness tingling or numbness, feels much better wants to go home today.   Objective:   Blood pressure 123/73, pulse 65, temperature 97.8 F (36.6 C), temperature source Axillary, resp. rate 20, height 6' (1.829 m), weight 125.42 kg (276 lb 8 oz), SpO2 96.00%.   Intake/Output Summary (Last 24 hours) at 12/12/12 1043 Last data filed at 12/12/12 0750  Gross per 24 hour  Intake   2400 ml  Output   2500 ml  Net   -100 ml    Exam Awake Alert, Oriented *3, No new F.N deficits, Normal affect [place].AT,PERRAL Supple Neck,No JVD, No cervical lymphadenopathy appriciated.  Symmetrical Chest wall movement, Good air movement bilaterally, CTAB RRR,No Gallops,Rubs or new Murmurs, No Parasternal Heave +ve B.Sounds, Abd Soft, Non tender, No organomegaly appriciated, No rebound -guarding or rigidity. No Cyanosis, Clubbing or edema, No new Rash or bruise  Data Review   CBC w Diff: Lab Results  Component Value Date   WBC 9.0 12/12/2012   HGB 11.9*  12/12/2012   HCT 35.7* 12/12/2012   PLT 213 12/12/2012   LYMPHOPCT 5* 12/07/2012   MONOPCT 11 12/07/2012   EOSPCT 0 12/07/2012   BASOPCT 0 12/07/2012    CMP: Lab Results  Component Value Date   NA 134* 12/12/2012   K 3.1* 12/12/2012   CL 99 12/12/2012   CO2 24 12/12/2012   BUN 31* 12/12/2012   CREATININE 1.20 12/12/2012   CREATININE 1.14 10/28/2012   PROT 7.9 12/07/2012   ALBUMIN 4.4 12/07/2012   BILITOT 0.5 12/07/2012   ALKPHOS 71 12/07/2012   AST 38* 12/07/2012   ALT 23 12/07/2012  .   Total Time in preparing paper work, data evaluation and todays exam - 35 minutes  Leroy Sea M.D on 12/12/2012 at 10:43 AM  [facility] Office  [phone]

## 2012-12-12 NOTE — Progress Notes (Signed)
12/12/2012 1130 PT paged to eval patient prior to d/c home per orders.  Channelle Bottger, Blanchard Kelch

## 2012-12-12 NOTE — Telephone Encounter (Signed)
Can you call this pt please and schedule appt 

## 2012-12-12 NOTE — Progress Notes (Signed)
12/12/2012 2:09 PM Nursing note Discharge avs form, medications already taken today and those due this evening given and explained to patient and grandson. Location of called in RX reviewed. Follow up appointments and when to call MD reviewed. D/c iv line. D/c tele. D/c home with family per orders.  Roselyn Doby, Blanchard Kelch

## 2012-12-13 ENCOUNTER — Ambulatory Visit (INDEPENDENT_AMBULATORY_CARE_PROVIDER_SITE_OTHER): Payer: Medicare Other | Admitting: Family

## 2012-12-13 ENCOUNTER — Ambulatory Visit (HOSPITAL_BASED_OUTPATIENT_CLINIC_OR_DEPARTMENT_OTHER)
Admission: RE | Admit: 2012-12-13 | Discharge: 2012-12-13 | Disposition: A | Discharge status: Home / Self Care | Payer: Medicare Other | Source: Ambulatory Visit | Attending: Family | Admitting: Family

## 2012-12-13 ENCOUNTER — Encounter: Payer: Self-pay | Admitting: Family

## 2012-12-13 ENCOUNTER — Other Ambulatory Visit: Payer: Self-pay | Admitting: Family

## 2012-12-13 VITALS — BP 124/64 | HR 62 | Temp 97.8°F | Resp 16 | Ht 70.0 in | Wt 287.0 lb

## 2012-12-13 DIAGNOSIS — E876 Hypokalemia: Secondary | ICD-10-CM

## 2012-12-13 DIAGNOSIS — E871 Hypo-osmolality and hyponatremia: Secondary | ICD-10-CM

## 2012-12-13 DIAGNOSIS — R0989 Other specified symptoms and signs involving the circulatory and respiratory systems: Secondary | ICD-10-CM

## 2012-12-13 DIAGNOSIS — D649 Anemia, unspecified: Secondary | ICD-10-CM

## 2012-12-13 DIAGNOSIS — R6510 Systemic inflammatory response syndrome (SIRS) of non-infectious origin without acute organ dysfunction: Secondary | ICD-10-CM

## 2012-12-13 LAB — CBC WITH DIFFERENTIAL/PLATELET
Basophils Relative: 1 % (ref 0–1)
Eosinophils Absolute: 0.2 10*3/uL (ref 0.0–0.7)
Eosinophils Relative: 2 % (ref 0–5)
HCT: 34.1 % — ABNORMAL LOW (ref 39.0–52.0)
Hemoglobin: 11.7 g/dL — ABNORMAL LOW (ref 13.0–17.0)
Lymphs Abs: 2.5 10*3/uL (ref 0.7–4.0)
MCH: 28.3 pg (ref 26.0–34.0)
MCHC: 34.3 g/dL (ref 30.0–36.0)
MCV: 82.4 fL (ref 78.0–100.0)
Monocytes Absolute: 0.9 10*3/uL (ref 0.1–1.0)
Monocytes Relative: 10 % (ref 3–12)
Neutrophils Relative %: 61 % (ref 43–77)
RBC: 4.14 MIL/uL — ABNORMAL LOW (ref 4.22–5.81)
WBC: 9.5 10*3/uL (ref 4.0–10.5)

## 2012-12-13 LAB — CULTURE, RESPIRATORY W GRAM STAIN

## 2012-12-13 LAB — BASIC METABOLIC PANEL
CO2: 26 mEq/L (ref 19–32)
Chloride: 102 mEq/L (ref 96–112)
Creat: 1.08 mg/dL (ref 0.50–1.35)
Potassium: 3.9 mEq/L (ref 3.5–5.3)
Sodium: 138 mEq/L (ref 135–145)

## 2012-12-13 NOTE — Patient Instructions (Signed)
Please complete lab work prior to leaving, and chest xray on the first floof. Follow up in 1 month.

## 2012-12-13 NOTE — Progress Notes (Signed)
Subjective:    Patient ID: Christian Morris, male    DOB: 05/28/1940, 72 y.o.   MRN: [id number]  HPI  Christian Morris is  72 yr old male who presents today for hospital follow up. Hospital records are reviewed.  Pt was admitted 12/5-12/8 with pneumonia and SIRS per discharge summary.  Review of 12/5 and 12/6 x ray notes no clear infiltrate, however pulmonary vascular congestion noted on 12/10/12 cxr.  He was discharged on on Augmentin. He has not yet picked it up.   Hypokalemia/hyponatremia-  12/8 bmet noted Na+ of 134 and K+ 3.1.  One sputum culture 12/6 + staph aureus- prelim report.  Blood culture NGTD x 2.   Pt report feeling tired since he came home.  Denies SOB or CP, or headache.  He has not checked his sugar since he came home today.    Review of Systems See HPI  Past Medical History  Diagnosis Date  . Arthritis   . Depression   . Diabetes mellitus   . Hypertension   . Hyperlipidemia   . History of gastric ulcer     Age 48- no recurrence since.  . Transfusion history     due to bleeding ulcer at age 71  . Hypogonadism male 01/11/2011  . Squamous cell carcinoma in situ of skin of forearm 01/23/2011  . Lung nodule     right 16 mm, seen initially 6/12. 13mm in 08/2011.  [name] GERD (gastroesophageal reflux disease)   . Headache(784.0)   . Fatty liver disease, nonalcoholic     History   Social History  . Marital Status: Divorced    Spouse Name: N/A    Number of Children: 3  . Years of Education: N/A   Occupational History  . RETIRED BUS DRIVER    Social History Main Topics  . Smoking status: Former Smoker -- 1.00 packs/day for 25 years    Types: Cigarettes    Quit date: 01/05/1982  . Smokeless tobacco: Never Used  . Alcohol Use: No  . Drug Use: No  . Sexual Activity: Not on file   Other Topics Concern  . 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    Past Surgical History  Procedure  Laterality Date  . Eye surgery  08/2009    cataract removal left eye--Dr Dione Booze  . Melanoma excision      left forearm    Family History  Problem Relation Age of Onset  . Arthritis Mother   . Heart disease Sister     Massive MI age 56.  . Arrhythmia Brother   . Melanoma Son   . Heart attack Brother     Allergies  Allergen Reactions  . Dust Mite Extract   . Other     Cockroach, grass and trees    Current Outpatient Prescriptions on File Prior to Visit  Medication Sig Dispense Refill  . aspirin EC 81 MG tablet Take 81 mg by mouth daily.      [name] atorvastatin (LIPITOR) 40 MG tablet Take 1 tablet (40 mg total) by mouth daily.  30 tablet  3  . diazepam (VALIUM) 10 MG tablet Take 1 tablet (10 mg total) by mouth at bedtime as needed for anxiety.  30 tablet  0  . diltiazem (CARDIZEM CD) 180 MG 24 hr capsule Take 1 capsule (180 mg total) by mouth daily.  30 capsule  3  . fish oil-omega-3 fatty acids 1000 MG capsule Take 2 g by mouth 2 (two) times daily.        . hydrochlorothiazide (HYDRODIURIL) 25 MG tablet Take 1 tablet (25 mg total) by mouth daily.  30 tablet  5  . HYDROcodone-acetaminophen (NORCO) 7.5-325 MG per tablet Take 1 tablet by mouth every 6 (six) hours as needed.  90 tablet  0  . insulin glargine (LANTUS) 100 units/mL SOLN Inject 16 Units into the skin at bedtime.       [name] losartan (COZAAR) 100 MG tablet Take 100 mg by mouth daily.      . metFORMIN (GLUCOPHAGE) 1000 MG tablet Take 1,000 mg by mouth daily.      . nitroGLYCERIN (NITROSTAT) 0.4 MG SL tablet Place 1 tablet (0.4 mg total) under the tongue every 5 (five) minutes x 3 doses as needed for chest pain (donot take with viagra).  60 tablet  12  . omeprazole (PRILOSEC) 20 MG capsule Take 1 capsule (20 mg total) by mouth daily.      [name] PARoxetine (PAXIL) 30 MG tablet Take 30 mg by mouth daily.      . pioglitazone (ACTOS) 30 MG tablet Take 1 tablet (30 mg total) by mouth daily.  30 tablet  3  . amoxicillin-clavulanate  (AUGMENTIN) 875-125 MG per tablet Take 1 tablet by mouth 2 (two) times daily.  10 tablet  0  . azithromycin (ZITHROMAX) 250 MG tablet Take 1 tablet (250 mg total) by mouth daily.  6 each  0  . tamsulosin (FLOMAX) 0.4 MG CAPS Take 1 capsule (0.4 mg total) by mouth daily.  30 capsule  3   No current facility-administered medications on file prior to visit.    BP 124/64  Pulse 62  Temp(Src) 97.8 F (36.6 C) (Oral)  Resp 16  Ht 5\' 10"  (1.778 m)  Wt 287 lb 0.6 oz (130.2 kg)  BMI 41.19 kg/m2  SpO2 92%       Objective:   Physical Exam  Constitutional: He is oriented to person, place, and time. He appears well-developed and well-nourished. No distress.  HENT:  Head: Normocephalic and atraumatic.  Cardiovascular: Normal rate and regular rhythm.   No murmur heard. Pulmonary/Chest: Effort normal. No respiratory distress. He has no wheezes. He exhibits no tenderness.  Faint bibasilar crackles.   Musculoskeletal: He exhibits no edema.  Lymphadenopathy:    He has no cervical adenopathy.  Neurological: He is alert and oriented to person, place, and time.  Psychiatric: He has a normal mood and affect. His behavior is normal. Judgment and thought content normal.          Assessment & Plan:

## 2012-12-14 ENCOUNTER — Telehealth: Payer: Self-pay | Admitting: *Deleted

## 2012-12-14 DIAGNOSIS — D649 Anemia, unspecified: Secondary | ICD-10-CM

## 2012-12-14 LAB — IRON AND TIBC
%SAT: 34 % (ref 20–55)
Iron: 93 ug/dL (ref 42–165)

## 2012-12-14 LAB — FERRITIN: Ferritin: 599 ng/mL — ABNORMAL HIGH (ref 22–322)

## 2012-12-14 NOTE — Telephone Encounter (Signed)
Message copied by Kathi Simpers on Wed Dec 14, 2012  6:55 PM ------      Message from: O'SULLIVAN, MELISSA      Created: Wed Dec 14, 2012 12:20 PM       Mild anemia. Please  Ask lab to add on iron, tibc, ferritin and ask pt to complete IFOB.  Kidney function is normal. Sugar slightly elevated. ------

## 2012-12-14 NOTE — Telephone Encounter (Signed)
Notified pt and he voices understanding. IFOB placed at front desk for pick up. Lab will add on below tests. Awaiting results.

## 2012-12-15 ENCOUNTER — Telehealth: Payer: Self-pay | Admitting: Cardiovascular Disease

## 2012-12-15 ENCOUNTER — Encounter: Payer: Medicare Other | Admitting: Cardiovascular Disease

## 2012-12-15 LAB — CULTURE, BLOOD (ROUTINE X 2): Culture: NO GROWTH

## 2012-12-15 NOTE — Telephone Encounter (Signed)
No show for appt in our cardiology office today. cdm

## 2012-12-15 NOTE — Progress Notes (Signed)
No show

## 2012-12-16 DIAGNOSIS — E871 Hypo-osmolality and hyponatremia: Secondary | ICD-10-CM | POA: Insufficient documentation

## 2012-12-16 DIAGNOSIS — D649 Anemia, unspecified: Secondary | ICD-10-CM | POA: Insufficient documentation

## 2012-12-16 LAB — CULTURE, BLOOD (ROUTINE X 2)
Culture: NO GROWTH
Culture: NO GROWTH

## 2012-12-16 NOTE — Assessment & Plan Note (Signed)
Follow up bmet shows normal Na+ and K+.

## 2012-12-16 NOTE — Telephone Encounter (Signed)
No need to schedule follow up. Already seen.

## 2012-12-16 NOTE — Assessment & Plan Note (Addendum)
Microcytic.  Normal iron level, increased ferritin, likely acute phase reactant due to sirs. Reviewed with Dr. Abner Greenspan. recommend 81 mg Aspirin and fish oil daily (he is taking both of these) Plan to repeat ferritin level and an LFT at his 1 month follow up.

## 2012-12-16 NOTE — Assessment & Plan Note (Signed)
Resolved. Felt secondary to pneumonia. Clinically stable, follow up cxr clear. Blood cultures negative. Complete augmentin.

## 2012-12-23 ENCOUNTER — Telehealth: Payer: Self-pay | Admitting: Family

## 2012-12-23 NOTE — Telephone Encounter (Signed)
Patient states that the medicine that Melissa put him on(does not know name of med) is making his hands/ankles/feet itch. He also states that he no longer taking the fish oil.

## 2012-12-23 NOTE — Telephone Encounter (Signed)
SAYS HIS HANDS AND ANKLES ITCH WANTS TO KNOW IF MELISSA THINKS IT COULD BE HIS MEDS

## 2012-12-26 ENCOUNTER — Ambulatory Visit: Payer: Medicare Other | Admitting: Family

## 2012-12-26 ENCOUNTER — Telehealth: Payer: Self-pay | Admitting: *Deleted

## 2012-12-26 MED ORDER — HYDROCODONE-ACETAMINOPHEN 7.5-325 MG PO TABS: 1.0000 | ORAL_TABLET | Freq: Four times a day (QID) | ORAL | Status: DC | PRN

## 2012-12-26 NOTE — Telephone Encounter (Signed)
Spoke with pt. He states he itches around his ankles and the back of his hands. States he has been itching since he moved here from [place]. Denies visible rash. Has been using a ?cream that he was given in the hospital but it doesn't seem to be helping. States he has mentioned this at previous appts, has seen Dr Verdis Frederickson? (dermatologist) in the past for skin lesion but not the itching. Pt has appt with [place] on 01/18/12.  Please advise.

## 2012-12-26 NOTE — Telephone Encounter (Signed)
Pt called stating his lortab will be due for a refill on 12/29/12. Wants to know if he can pick up Rx tomorrow? Last rx printed 11/28/12.  Printed rx and forwarded to Provider for signature.

## 2012-12-26 NOTE — Telephone Encounter (Signed)
Notified pt and he voices understanding. 

## 2012-12-26 NOTE — Telephone Encounter (Signed)
Signed.

## 2012-12-26 NOTE — Telephone Encounter (Signed)
I recommend a good moisturizer daily such as lubriderm. Also,  Can apply otc hydrocortisone once daily to affected area. I will look at it when he follows back up.

## 2012-12-26 NOTE — Telephone Encounter (Signed)
Notified pt and placed rx at front desk for pick up. 

## 2013-01-07 ENCOUNTER — Emergency Department (HOSPITAL_COMMUNITY)
Admission: EM | Admit: 2013-01-07 | Discharge: 2013-01-07 | Disposition: A | Discharge status: Home / Self Care | Payer: Medicare Other | Attending: Emergency Medicine | Admitting: Emergency Medicine

## 2013-01-07 ENCOUNTER — Encounter (HOSPITAL_COMMUNITY): Payer: Self-pay | Admitting: Emergency Medicine

## 2013-01-07 ENCOUNTER — Emergency Department (HOSPITAL_COMMUNITY): Payer: Medicare Other

## 2013-01-07 DIAGNOSIS — Z87891 Personal history of nicotine dependence: Secondary | ICD-10-CM | POA: Insufficient documentation

## 2013-01-07 DIAGNOSIS — Z794 Long term (current) use of insulin: Secondary | ICD-10-CM | POA: Insufficient documentation

## 2013-01-07 DIAGNOSIS — E785 Hyperlipidemia, unspecified: Secondary | ICD-10-CM | POA: Insufficient documentation

## 2013-01-07 DIAGNOSIS — Z79899 Other long term (current) drug therapy: Secondary | ICD-10-CM | POA: Insufficient documentation

## 2013-01-07 DIAGNOSIS — E119 Type 2 diabetes mellitus without complications: Secondary | ICD-10-CM | POA: Insufficient documentation

## 2013-01-07 DIAGNOSIS — I10 Essential (primary) hypertension: Secondary | ICD-10-CM | POA: Insufficient documentation

## 2013-01-07 DIAGNOSIS — Z8739 Personal history of other diseases of the musculoskeletal system and connective tissue: Secondary | ICD-10-CM | POA: Insufficient documentation

## 2013-01-07 DIAGNOSIS — Z862 Personal history of diseases of the blood and blood-forming organs and certain disorders involving the immune mechanism: Secondary | ICD-10-CM | POA: Insufficient documentation

## 2013-01-07 DIAGNOSIS — K219 Gastro-esophageal reflux disease without esophagitis: Secondary | ICD-10-CM | POA: Insufficient documentation

## 2013-01-07 DIAGNOSIS — Z7982 Long term (current) use of aspirin: Secondary | ICD-10-CM | POA: Insufficient documentation

## 2013-01-07 DIAGNOSIS — N201 Calculus of ureter: Secondary | ICD-10-CM

## 2013-01-07 DIAGNOSIS — Z8639 Personal history of other endocrine, nutritional and metabolic disease: Secondary | ICD-10-CM | POA: Insufficient documentation

## 2013-01-07 LAB — CBC WITH DIFFERENTIAL/PLATELET
Basophils Absolute: 0.1 10*3/uL (ref 0.0–0.1)
Basophils Relative: 1 % (ref 0–1)
EOS ABS: 0.3 10*3/uL (ref 0.0–0.7)
EOS PCT: 2 % (ref 0–5)
HEMATOCRIT: 36.9 % — AB (ref 39.0–52.0)
Hemoglobin: 12.1 g/dL — ABNORMAL LOW (ref 13.0–17.0)
Lymphocytes Relative: 19 % (ref 12–46)
Lymphs Abs: 2.5 10*3/uL (ref 0.7–4.0)
MCH: 29.3 pg (ref 26.0–34.0)
MCHC: 32.8 g/dL (ref 30.0–36.0)
MCV: 89.3 fL (ref 78.0–100.0)
MONO ABS: 0.9 10*3/uL (ref 0.1–1.0)
Monocytes Relative: 7 % (ref 3–12)
Neutro Abs: 9.3 10*3/uL — ABNORMAL HIGH (ref 1.7–7.7)
Neutrophils Relative %: 71 % (ref 43–77)
PLATELETS: 211 10*3/uL (ref 150–400)
RBC: 4.13 MIL/uL — ABNORMAL LOW (ref 4.22–5.81)
RDW: 14.7 % (ref 11.5–15.5)
WBC: 13 10*3/uL — ABNORMAL HIGH (ref 4.0–10.5)

## 2013-01-07 LAB — URINALYSIS, ROUTINE W REFLEX MICROSCOPIC
Glucose, UA: 100 mg/dL — AB
Ketones, ur: NEGATIVE mg/dL
Nitrite: NEGATIVE
Protein, ur: 100 mg/dL — AB
Specific Gravity, Urine: 1.025 (ref 1.005–1.030)
UROBILINOGEN UA: 1 mg/dL (ref 0.0–1.0)
pH: 6 (ref 5.0–8.0)

## 2013-01-07 LAB — BASIC METABOLIC PANEL
BUN: 37 mg/dL — ABNORMAL HIGH (ref 6–23)
CO2: 22 mEq/L (ref 19–32)
Calcium: 9.4 mg/dL (ref 8.4–10.5)
Chloride: 101 mEq/L (ref 96–112)
Creatinine, Ser: 1.28 mg/dL (ref 0.50–1.35)
GFR calc Af Amer: 62 mL/min — ABNORMAL LOW (ref >90)
GFR, EST NON AFRICAN AMERICAN: 53 mL/min — AB (ref >90)
Glucose, Bld: 182 mg/dL — ABNORMAL HIGH (ref 70–99)
Potassium: 4.4 mEq/L (ref 3.7–5.3)
SODIUM: 139 meq/L (ref 137–147)

## 2013-01-07 LAB — URINE MICROSCOPIC-ADD ON

## 2013-01-07 MED ORDER — SODIUM CHLORIDE 0.9 % IV BOLUS (SEPSIS)
1000.0000 mL | Freq: Once | INTRAVENOUS | Status: AC
  Administered 2013-01-07: 1000 mL via INTRAVENOUS

## 2013-01-07 MED ORDER — MORPHINE SULFATE 4 MG/ML IJ SOLN
4.0000 mg | Freq: Once | INTRAMUSCULAR | Status: AC
  Administered 2013-01-07: 4 mg via INTRAVENOUS

## 2013-01-07 MED ORDER — ONDANSETRON 8 MG PO TBDP: 8.0000 mg | ORAL_TABLET | Freq: Three times a day (TID) | ORAL | Status: DC | PRN

## 2013-01-07 MED ORDER — ONDANSETRON HCL 4 MG/2ML IJ SOLN: 4.0000 mg | Freq: Once | INTRAMUSCULAR | Status: DC

## 2013-01-07 MED ORDER — MORPHINE SULFATE 4 MG/ML IJ SOLN
4.0000 mg | Freq: Once | INTRAMUSCULAR | Status: AC
  Administered 2013-01-07: 4 mg via INTRAVENOUS
  Filled 2013-01-07: qty 1

## 2013-01-07 MED ORDER — OXYCODONE-ACETAMINOPHEN 5-325 MG PO TABS: 1.0000 | ORAL_TABLET | ORAL | Status: DC | PRN

## 2013-01-07 MED ORDER — MORPHINE SULFATE 4 MG/ML IJ SOLN
INTRAMUSCULAR | Status: AC
  Filled 2013-01-07: qty 1

## 2013-01-07 MED ORDER — TAMSULOSIN HCL 0.4 MG PO CAPS: 0.4000 mg | ORAL_CAPSULE | Freq: Every day | ORAL | Status: DC

## 2013-01-07 MED ORDER — KETOROLAC TROMETHAMINE 30 MG/ML IJ SOLN
30.0000 mg | Freq: Once | INTRAMUSCULAR | Status: AC
  Administered 2013-01-07: 30 mg via INTRAVENOUS
  Filled 2013-01-07: qty 1

## 2013-01-07 MED ORDER — ONDANSETRON HCL 4 MG/2ML IJ SOLN
4.0000 mg | Freq: Once | INTRAMUSCULAR | Status: AC
  Administered 2013-01-07: 4 mg via INTRAVENOUS
  Filled 2013-01-07: qty 2

## 2013-01-07 NOTE — ED Notes (Signed)
Patient is alert and oriented x3.  He is complaining of bilateral flank pain that radiates around to his groin. Patient has a history of kidney stones.  Patient was given 150 mcg of fentanyl from EMS.

## 2013-01-07 NOTE — ED Provider Notes (Signed)
CSN: [id number]     Arrival date & time 01/07/13  0303 History   First MD Initiated Contact with Patient 01/07/13 [phone]     Chief Complaint  Patient presents with  . Flank Pain   (Consider location/radiation/quality/duration/timing/severity/associated sxs/prior Treatment) HPI Comments: Patient is a 73 year old male with a past medical history of diabetes, hypertension, and arthritis who presents with abdominal pain that started today. The pain is located in his lower abdomen and does not radiate. The pain is described as aching and moderate. The pain started gradually and progressively worsened since the onset. No alleviating/aggravating factors. The patient has tried nothing for symptoms without relief. Associated symptoms include dysuria and hematuria that he noticed prior to arrival. Patient denies fever, headache, NVD, chest pain, SOB, constipation. Patient reports a history of kidney stones and "thinks this is what he felt like."    Patient is a 73 y.o. male presenting with flank pain.  Flank Pain Associated symptoms include abdominal pain. Pertinent negatives include no arthralgias, chest pain, chills, fatigue, fever, nausea, neck pain, vomiting or weakness.    Past Medical History  Diagnosis Date  . Arthritis   . Depression   . Diabetes mellitus   . Hypertension   . Hyperlipidemia   . History of gastric ulcer     Age 55- no recurrence since.  . Transfusion history     due to bleeding ulcer at age 28  . Hypogonadism male 01/11/2011  . Squamous cell carcinoma in situ of skin of forearm 01/23/2011  . Lung nodule     right 16 mm, seen initially 6/12. 66mm in 08/2011.  [name] GERD (gastroesophageal reflux disease)   . Headache(784.0)   . Fatty liver disease, nonalcoholic    Past Surgical History  Procedure Laterality Date  . Eye surgery  08/2009    cataract removal left eye--Dr Katy Fitch  . Melanoma excision      left forearm   Family History  Problem Relation Age of Onset  . Arthritis  Mother   . Heart disease Sister     Massive MI age 33.  . Arrhythmia Brother   . Melanoma Son   . Heart attack Brother    History  Substance Use Topics  . Smoking status: Former Smoker -- 1.00 packs/day for 25 years    Types: Cigarettes    Quit date: 01/05/1982  . Smokeless tobacco: Never Used  . Alcohol Use: No    Review of Systems  Constitutional: Negative for fever, chills and fatigue.  HENT: Negative for trouble swallowing.   Eyes: Negative for visual disturbance.  Respiratory: Negative for shortness of breath.   Cardiovascular: Negative for chest pain and palpitations.  Gastrointestinal: Positive for abdominal pain. Negative for nausea, vomiting and diarrhea.  Genitourinary: Positive for dysuria, hematuria and flank pain. Negative for difficulty urinating.  Musculoskeletal: Negative for arthralgias and neck pain.  Skin: Negative for color change.  Neurological: Negative for dizziness and weakness.  Psychiatric/Behavioral: Negative for dysphoric mood.    Allergies  Review of patient's allergies indicates no known allergies.  Home Medications   Current Outpatient Rx  Name  Route  Sig  Dispense  Refill  . acetaminophen (TYLENOL) 500 MG tablet   Oral   Take 1,000 mg by mouth every 8 (eight) hours as needed for mild pain.         [name] aspirin EC 81 MG tablet   Oral   Take 81 mg by mouth every morning.          [name]  diazepam (VALIUM) 10 MG tablet   Oral   Take 1 tablet (10 mg total) by mouth at bedtime as needed for anxiety.   30 tablet   0   . diltiazem (CARDIZEM CD) 180 MG 24 hr capsule   Oral   Take 1 capsule (180 mg total) by mouth daily.   30 capsule   3   . fexofenadine (ALLEGRA) 180 MG tablet   Oral   Take 180 mg by mouth every morning.         . fish oil-omega-3 fatty acids 1000 MG capsule   Oral   Take 2 g by mouth 2 (two) times daily.           . hydrochlorothiazide (HYDRODIURIL) 25 MG tablet   Oral   Take 1 tablet (25 mg total) by mouth  daily.   30 tablet   5   . HYDROcodone-acetaminophen (NORCO) 7.5-325 MG per tablet   Oral   Take 1 tablet by mouth every 6 (six) hours as needed for moderate pain.         [name] insulin glargine (LANTUS) 100 units/mL SOLN   Subcutaneous   Inject 16 Units into the skin at bedtime.          [name] losartan (COZAAR) 100 MG tablet   Oral   Take 100 mg by mouth every morning.          . metFORMIN (GLUCOPHAGE) 500 MG tablet   Oral   Take 1,000 mg by mouth 2 (two) times daily with a meal.          . PARoxetine (PAXIL) 30 MG tablet   Oral   Take 30 mg by mouth every morning.          . pioglitazone (ACTOS) 30 MG tablet   Oral   Take 1 tablet (30 mg total) by mouth daily.   30 tablet   3   . nitroGLYCERIN (NITROSTAT) 0.4 MG SL tablet   Sublingual   Place 1 tablet (0.4 mg total) under the tongue every 5 (five) minutes x 3 doses as needed for chest pain (donot take with viagra).   60 tablet   12    BP 119/84  Pulse 81  Temp(Src) 98.2 F (36.8 C) (Oral)  Resp 20  SpO2 94% Physical Exam  Nursing note and vitals reviewed. Constitutional: He is oriented to person, place, and time. He appears well-developed and well-nourished. No distress.  HENT:  Head: Normocephalic and atraumatic.  Eyes: Conjunctivae and EOM are normal.  Neck: Normal range of motion.  Cardiovascular: Normal rate and regular rhythm.  Exam reveals no gallop and no friction rub.   No murmur heard. Pulmonary/Chest: Effort normal and breath sounds normal. He has no wheezes. He has no rales. He exhibits no tenderness.  Abdominal: Soft. He exhibits no distension. There is tenderness. There is no rebound and no guarding.  Mild suprapubic tenderness to palpation. No other focal tenderness or peritoneal signs.   Musculoskeletal: Normal range of motion.  Neurological: He is alert and oriented to person, place, and time. Coordination normal.  Speech is goal-oriented. Moves limbs without ataxia.   Skin: Skin is warm  and dry.  Psychiatric: He has a normal mood and affect. His behavior is normal.    ED Course  Procedures (including critical care time) Labs Review Labs Reviewed  CBC WITH DIFFERENTIAL - Abnormal; Notable for the following:    WBC 13.0 (*)    RBC 4.13 (*)  Hemoglobin 12.1 (*)    HCT 36.9 (*)    Neutro Abs 9.3 (*)    All other components within normal limits  BASIC METABOLIC PANEL - Abnormal; Notable for the following:    Glucose, Bld 182 (*)    BUN 37 (*)    GFR calc non Af Amer 53 (*)    GFR calc Af Amer 62 (*)    All other components within normal limits  URINALYSIS, ROUTINE W REFLEX MICROSCOPIC - Abnormal; Notable for the following:    Color, Urine RED (*)    APPearance TURBID (*)    Glucose, UA 100 (*)    Hgb urine dipstick LARGE (*)    Bilirubin Urine MODERATE (*)    Protein, ur 100 (*)    Leukocytes, UA SMALL (*)    All other components within normal limits  URINE CULTURE  URINE MICROSCOPIC-ADD ON   Imaging Review Ct Abdomen Pelvis Wo Contrast  01/07/2013   CLINICAL DATA:  Left hip pain and hematuria  EXAM: CT ABDOMEN AND PELVIS WITHOUT CONTRAST  TECHNIQUE: Multidetector CT imaging of the abdomen and pelvis was performed following the standard protocol without intravenous contrast.  COMPARISON:  10/24/2012  FINDINGS: BODY WALL: Unremarkable.  LOWER CHEST: Calcified granuloma in the right lower lobe.  ABDOMEN/PELVIS:  Liver: No focal abnormality.  Biliary: No evidence of biliary obstruction or stone.  Pancreas: Unremarkable.  Spleen: Unremarkable.  Adrenals: Unremarkable.  Kidneys and ureters: Mild left hydronephrosis secondary to a mid ureteral stone measuring 8 mm. There is extensive left perinephric stranding. Punctate stones are present in the lower poles of the bilateral kidneys, nonobstructive.  Bladder: Unremarkable.  Reproductive: Unremarkable.  Bowel: No obstruction. Normal appendix.  Retroperitoneum: No mass or adenopathy.  Peritoneum: No free fluid or gas.   Vascular: No acute abnormality.  OSSEOUS: Bilateral L5 pars defects with grade 1-2 anterolisthesis. Diffuse lumbar degenerative disc and facet disease.  IMPRESSION: 1. 8 mm stone in the mid left ureter that causes obstructive uropathy. 2. Nonobstructive bilateral nephrolithiasis.   Electronically Signed   By: Jorje Guild M.D.   On: 01/07/2013 05:44    EKG Interpretation   None       MDM   1. Ureteral stone     4:02 AM Labs and urinalysis pending. Vitals stable and patient afebrile. Patient declines pain medication at this time.   6:15 AM Labs show elevated WBC. No kidney function compromise noted. Urinalysis shows copious blood. CT scan shows 83mm stone in lid left ureter. Patient given 150 mcg fentanyl en route and morphine here. Patient will have toradol. I spoke with Dr. Junious Silk who advised pain control in the ED and outpatient follow up. If pain cannot be controlled in the ED, Urology will be paged again.   Patient signed out to Dr. Venora Maples pending improvement of pain.   Alvina Chou, PA-C 01/08/13 0004

## 2013-01-07 NOTE — ED Notes (Signed)
Bed: WA21 Expected date:  Expected time:  Means of arrival:  Comments: EMS 72yo Urinary sx

## 2013-01-07 NOTE — ED Notes (Signed)
Per [place] wait to discharge until ride arrives to monitor pt's pain.

## 2013-01-08 LAB — URINE CULTURE
Colony Count: NO GROWTH
Culture: NO GROWTH
SPECIAL REQUESTS: NORMAL

## 2013-01-08 NOTE — ED Provider Notes (Signed)
Medical screening examination/treatment/procedure(s) were conducted as a shared visit with non-physician practitioner(s) and myself.  I personally evaluated the patient during the encounter.  EKG Interpretation   None       Large left ureteral stone. Complete pain control in ER. Strict stone precautions given. Nontoxic appearing. Urology follow up. Discussion had with urology, Dr Junious Silk by my PA  Hoy Morn, MD 01/08/13 [phone]

## 2013-01-11 ENCOUNTER — Ambulatory Visit: Payer: Medicare Other | Admitting: Physician Assistant

## 2013-01-17 ENCOUNTER — Ambulatory Visit (INDEPENDENT_AMBULATORY_CARE_PROVIDER_SITE_OTHER): Payer: Medicare Other | Admitting: Family

## 2013-01-17 ENCOUNTER — Encounter: Payer: Self-pay | Admitting: Family

## 2013-01-17 ENCOUNTER — Other Ambulatory Visit: Payer: Self-pay | Admitting: Family

## 2013-01-17 ENCOUNTER — Ambulatory Visit (HOSPITAL_BASED_OUTPATIENT_CLINIC_OR_DEPARTMENT_OTHER)
Admission: RE | Admit: 2013-01-17 | Discharge: 2013-01-17 | Disposition: A | Discharge status: Home / Self Care | Payer: Medicare Other | Source: Ambulatory Visit | Attending: Family | Admitting: Family

## 2013-01-17 VITALS — BP 110/80 | HR 64 | Temp 98.5°F | Resp 18 | Ht 70.0 in | Wt 287.0 lb

## 2013-01-17 DIAGNOSIS — D649 Anemia, unspecified: Secondary | ICD-10-CM

## 2013-01-17 DIAGNOSIS — N200 Calculus of kidney: Secondary | ICD-10-CM

## 2013-01-17 DIAGNOSIS — Z23 Encounter for immunization: Secondary | ICD-10-CM

## 2013-01-17 DIAGNOSIS — R911 Solitary pulmonary nodule: Secondary | ICD-10-CM | POA: Insufficient documentation

## 2013-01-17 DIAGNOSIS — E1159 Type 2 diabetes mellitus with other circulatory complications: Secondary | ICD-10-CM

## 2013-01-17 LAB — BASIC METABOLIC PANEL
BUN: 30 mg/dL — AB (ref 6–23)
CHLORIDE: 95 meq/L — AB (ref 96–112)
CO2: 26 mEq/L (ref 19–32)
Calcium: 10.2 mg/dL (ref 8.4–10.5)
Creat: 1.51 mg/dL — ABNORMAL HIGH (ref 0.50–1.35)
Glucose, Bld: 129 mg/dL — ABNORMAL HIGH (ref 70–99)
Potassium: 4.6 mEq/L (ref 3.5–5.3)
Sodium: 133 mEq/L — ABNORMAL LOW (ref 135–145)

## 2013-01-17 LAB — CBC WITH DIFFERENTIAL/PLATELET
BASOS ABS: 0.1 10*3/uL (ref 0.0–0.1)
BASOS PCT: 1 % (ref 0–1)
Eosinophils Absolute: 0.1 10*3/uL (ref 0.0–0.7)
Eosinophils Relative: 1 % (ref 0–5)
HCT: 36.2 % — ABNORMAL LOW (ref 39.0–52.0)
Hemoglobin: 11.9 g/dL — ABNORMAL LOW (ref 13.0–17.0)
LYMPHS PCT: 22 % (ref 12–46)
Lymphs Abs: 2.2 10*3/uL (ref 0.7–4.0)
MCH: 28.1 pg (ref 26.0–34.0)
MCHC: 32.9 g/dL (ref 30.0–36.0)
MCV: 85.6 fL (ref 78.0–100.0)
MONO ABS: 0.7 10*3/uL (ref 0.1–1.0)
Monocytes Relative: 7 % (ref 3–12)
NEUTROS ABS: 6.8 10*3/uL (ref 1.7–7.7)
Neutrophils Relative %: 69 % (ref 43–77)
PLATELETS: 481 10*3/uL — AB (ref 150–400)
RBC: 4.23 MIL/uL (ref 4.22–5.81)
RDW: 14.3 % (ref 11.5–15.5)
WBC: 10 10*3/uL (ref 4.0–10.5)

## 2013-01-17 NOTE — Progress Notes (Signed)
Pre visit review using our clinic review tool, if applicable. No additional management support is needed unless otherwise documented below in the visit note. 

## 2013-01-17 NOTE — Assessment & Plan Note (Signed)
Check follow up cbc.

## 2013-01-17 NOTE — Patient Instructions (Signed)
Please complete your lab work prior to leaving. Complete your x ray on the first floor.

## 2013-01-17 NOTE — Progress Notes (Signed)
Subjective:    Patient ID: Christian Morris, male    DOB: Dec 14, 1940, 73 y.o.   MRN: [id number]  HPI  Christian Morris is a 73 yr old male who presents today for 1 month follow up of anemia.   Lab Results  Component Value Date   WBC 13.0* 01/07/2013   HGB 12.1* 01/07/2013   HCT 36.9* 01/07/2013   MCV 89.3 01/07/2013   PLT 211 01/07/2013   Since his last visit, he was evaluated in the ED for flank pain.  Records are reviewed.  He had a CT of the abdomen and pelvis which noted a 1.8 mm stone in the mid left ureter that was causing obstructive uropathy. Outpatient follow up with Dr. Junious Silk was arranged per ED note however pt tells me he did not see urology. Tells me that his flank pain is improved- No pain last night and no pain today. Has not passed any visible stones.  Had hematuria x 1 day the day he was seen in the hospital but notes resolution of his bleeding.   Review of Systems See HPI  Past Medical History  Diagnosis Date  . Arthritis   . Depression   . Diabetes mellitus   . Hypertension   . Hyperlipidemia   . History of gastric ulcer     Age 1- no recurrence since.  . Transfusion history     due to bleeding ulcer at age 55  . Hypogonadism male 01/11/2011  . Squamous cell carcinoma in situ of skin of forearm 01/23/2011  . Lung nodule     right 16 mm, seen initially 6/12. 11mm in 08/2011.  [name] GERD (gastroesophageal reflux disease)   . Headache(784.0)   . Fatty liver disease, nonalcoholic     History   Social History  . Marital Status: Divorced    Spouse Name: N/A    Number of Children: 3  . Years of Education: N/A   Occupational History  . RETIRED BUS DRIVER    Social History Main Topics  . Smoking status: Former Smoker -- 1.00 packs/day for 25 years    Types: Cigarettes    Quit date: 01/05/1982  . Smokeless tobacco: Never Used  . Alcohol Use: No  . Drug Use: No  . Sexual Activity: Not on file   Other Topics Concern  . 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    Past Surgical History  Procedure Laterality Date  . Eye surgery  08/2009    cataract removal left eye--Dr Katy Fitch  . Melanoma excision      left forearm    Family History  Problem Relation Age of Onset  . Arthritis Mother   . Heart disease Sister     Massive MI age 27.  . Arrhythmia Brother   . Melanoma Son   . Heart attack Brother     No Known Allergies  Current Outpatient Prescriptions on File Prior to Visit  Medication Sig Dispense Refill  . acetaminophen (TYLENOL) 500 MG tablet Take 1,000 mg by mouth every 8 (eight) hours as needed for mild pain.      [name] aspirin EC 81 MG tablet Take 81 mg by mouth every morning.       . diazepam (VALIUM) 10 MG tablet Take 1 tablet (10 mg total) by mouth at bedtime as needed for anxiety.  30 tablet  0  .  diltiazem (CARDIZEM CD) 180 MG 24 hr capsule Take 1 capsule (180 mg total) by mouth daily.  30 capsule  3  . fexofenadine (ALLEGRA) 180 MG tablet Take 180 mg by mouth every morning.      . fish oil-omega-3 fatty acids 1000 MG capsule Take 2 g by mouth 2 (two) times daily.        . hydrochlorothiazide (HYDRODIURIL) 25 MG tablet Take 1 tablet (25 mg total) by mouth daily.  30 tablet  5  . HYDROcodone-acetaminophen (NORCO) 7.5-325 MG per tablet Take 1 tablet by mouth every 6 (six) hours as needed for moderate pain.      [name] insulin glargine (LANTUS) 100 units/mL SOLN Inject 16 Units into the skin at bedtime.       [name] losartan (COZAAR) 100 MG tablet Take 100 mg by mouth every morning.       . metFORMIN (GLUCOPHAGE) 500 MG tablet Take 1,000 mg by mouth 2 (two) times daily with a meal.       . nitroGLYCERIN (NITROSTAT) 0.4 MG SL tablet Place 1 tablet (0.4 mg total) under the tongue every 5 (five) minutes x 3 doses as needed for chest pain (donot take with viagra).  60 tablet  12  . ondansetron (ZOFRAN ODT) 8 MG disintegrating tablet Take 1 tablet (8 mg total) by mouth every 8  (eight) hours as needed for nausea or vomiting.  10 tablet  0  . oxyCODONE-acetaminophen (PERCOCET/ROXICET) 5-325 MG per tablet Take 1 tablet by mouth every 4 (four) hours as needed for severe pain.  25 tablet  0  . PARoxetine (PAXIL) 30 MG tablet Take 30 mg by mouth every morning.       . pioglitazone (ACTOS) 30 MG tablet Take 1 tablet (30 mg total) by mouth daily.  30 tablet  3  . tamsulosin (FLOMAX) 0.4 MG CAPS capsule Take 1 capsule (0.4 mg total) by mouth daily.  10 capsule  0   No current facility-administered medications on file prior to visit.    BP 110/80  Pulse 64  Temp(Src) 98.5 F (36.9 C) (Oral)  Resp 18  Ht 5\' 10"  (1.778 m)  Wt 287 lb (130.182 kg)  BMI 41.18 kg/m2  SpO2 96%       Objective:   Physical Exam  Constitutional: He is oriented to person, place, and time. He appears well-developed and well-nourished. No distress.  HENT:  Head: Normocephalic and atraumatic.  Cardiovascular: Normal rate and regular rhythm.   No murmur heard. Pulmonary/Chest: Effort normal and breath sounds normal. No respiratory distress. He has no wheezes. He has no rales. He exhibits no tenderness.  Abdominal: Soft. Bowel sounds are normal.  Genitourinary:  Neg CVAT bilaterally  Neurological: He is alert and oriented to person, place, and time.  Skin: Skin is warm and dry.  Psychiatric: He has a normal mood and affect. His behavior is normal. Judgment and thought content normal.          Assessment & Plan:

## 2013-01-17 NOTE — Assessment & Plan Note (Signed)
Clinically resolved.  ? Passed stone.  Obtain follow up KUB, BMET, refer to urology if persistent stone or if renal insufficiency.

## 2013-01-18 ENCOUNTER — Telehealth: Payer: Self-pay | Admitting: Family

## 2013-01-18 DIAGNOSIS — N289 Disorder of kidney and ureter, unspecified: Secondary | ICD-10-CM

## 2013-01-18 DIAGNOSIS — D649 Anemia, unspecified: Secondary | ICD-10-CM

## 2013-01-18 LAB — HEMOGLOBIN A1C
HEMOGLOBIN A1C: 7.1 % — AB (ref <5.7)
MEAN PLASMA GLUCOSE: 157 mg/dL — AB (ref <117)

## 2013-01-18 NOTE — Telephone Encounter (Signed)
Please let pt know that his sugar is improving and is almost at goal.  He is anemic still.  I would like him to complete an IFOB please.  Kidney function slighlty impaired, likely related to recent kidney stone.  Please have pt repeat bmet in 1 week. Dx is renal insufficiency

## 2013-01-18 NOTE — Telephone Encounter (Signed)
Patient states that he is returning phone call.

## 2013-01-18 NOTE — Telephone Encounter (Signed)
Notified pt and he voices understanding. He will pick up IFOB kit this week; kit placed at front desk for pick up. Lab order entered. 

## 2013-01-24 ENCOUNTER — Telehealth: Payer: Self-pay | Admitting: Family

## 2013-01-24 NOTE — Telephone Encounter (Signed)
Patient Information:  Caller Name: Nobuo  Phone: [phone]  Patient: Christian Morris  Gender: Male  DOB: April 05, 1940  Age: 73 Years  PCP: [name] (Adults only)  Office Follow Up:  Does the office need to follow up with this patient?: No  Instructions For The Office: N/A   Symptoms  Reason For Call & Symptoms: Pain this am in lower abdomen and around the head of penis;  After urinating pain went away;  Urine was bloody and small black specks noted in it twice so far today;  No pain at time of call except around the head of penis.  Reviewed Health History In EMR: Yes  Reviewed Medications In EMR: Yes  Reviewed Allergies In EMR: Yes  Reviewed Surgeries / Procedures: Yes  Date of Onset of Symptoms: 01/24/2013  Guideline(s) Used:  Urination Pain - Male  Disposition Per Guideline:   See Today in Office  Reason For Disposition Reached:   All other males with painful urination, or patient wants to be seen  Advice Given:  Fluids  : Drink extra fluids (Reason: to produce a dilute, nonirritating urine).  Patient Refused Recommendation:  Patient Refused Appt, Patient Requests Appt At Later Date  Patient has appt. with pcp 1/23;  Appt. offered with another provider for today per disposition due to pcp not available but patient prefers to wait and see his pcp.  Reviewed emergent symptoms with patient, to call for increased bleeding, pain, pain in back or sides, fever.

## 2013-01-27 ENCOUNTER — Encounter: Payer: Self-pay | Admitting: Family

## 2013-01-27 ENCOUNTER — Ambulatory Visit (INDEPENDENT_AMBULATORY_CARE_PROVIDER_SITE_OTHER): Payer: Medicare Other | Admitting: Family

## 2013-01-27 VITALS — BP 120/64 | HR 71 | Temp 97.9°F | Ht 70.0 in | Wt 285.0 lb

## 2013-01-27 DIAGNOSIS — N200 Calculus of kidney: Secondary | ICD-10-CM

## 2013-01-27 DIAGNOSIS — M79673 Pain in unspecified foot: Secondary | ICD-10-CM

## 2013-01-27 DIAGNOSIS — M545 Low back pain, unspecified: Secondary | ICD-10-CM

## 2013-01-27 DIAGNOSIS — M79609 Pain in unspecified limb: Secondary | ICD-10-CM

## 2013-01-27 LAB — BASIC METABOLIC PANEL
BUN: 21 mg/dL (ref 6–23)
CO2: 26 meq/L (ref 19–32)
CREATININE: 1.28 mg/dL (ref 0.50–1.35)
Calcium: 9.7 mg/dL (ref 8.4–10.5)
Chloride: 101 mEq/L (ref 96–112)
Glucose, Bld: 184 mg/dL — ABNORMAL HIGH (ref 70–99)
Potassium: 4.6 mEq/L (ref 3.5–5.3)
SODIUM: 139 meq/L (ref 135–145)

## 2013-01-27 MED ORDER — HYDROCODONE-ACETAMINOPHEN 7.5-325 MG PO TABS: 1.0000 | ORAL_TABLET | Freq: Four times a day (QID) | ORAL | Status: DC | PRN

## 2013-01-27 NOTE — Patient Instructions (Signed)
You can try icing your feet each night to see if this helps with your pain.  Please follow up in April as scheduled.

## 2013-01-27 NOTE — Assessment & Plan Note (Signed)
Appears that he passed stone.  Symptoms resolved.  Had mild renal insufficiency last visit. Check follow up bmet today.

## 2013-01-27 NOTE — Assessment & Plan Note (Signed)
?   Plantar fasciitis. Offered referral to podiatry but he declines at this time. He can't take NSAIDS due to renal insuffiency.  Recommended that he ice his feet nightly by rolling on a frozen water bottle.

## 2013-01-27 NOTE — Assessment & Plan Note (Signed)
Advised pt that I am not comfortable increasing his hydrocodone. Suggested referral to pain clinic but he declines at this time due to cost.

## 2013-01-27 NOTE — Progress Notes (Signed)
Pre-visit discussion using our clinic review tool. No additional management support is needed unless otherwise documented below in the visit note.  

## 2013-01-27 NOTE — Progress Notes (Signed)
Subjective:    Patient ID: Christian Morris, male    DOB: 1940/12/16, 73 y.o.   MRN: [id number]  HPI  Christian Morris is a 74 yr old male who presents today with chief complaint of bilateral foot pain.   Reports that his feet started hurting a few months ago.  He reports pain is present in both feet L>R. Reports that pain is constant but has "gotten better."   He is unable to describe the pain.    Reports that Tuesday he passed dark particles in his urine x 2. He had some suprapubic pain prior which resolved.  Requesting rx for his pain meds.    Chronic low back pain-  Requesting something stronger for pain.  Currently on hydrocodone 7.5/325.  Review of Systems See HPI  Past Medical History  Diagnosis Date  . Arthritis   . Depression   . Diabetes mellitus   . Hypertension   . Hyperlipidemia   . History of gastric ulcer     Age 31- no recurrence since.  . Transfusion history     due to bleeding ulcer at age 52  . Hypogonadism male 01/11/2011  . Squamous cell carcinoma in situ of skin of forearm 01/23/2011  . Lung nodule     right 16 mm, seen initially 6/12. 72mm in 08/2011.  [name] GERD (gastroesophageal reflux disease)   . Headache(784.0)   . Fatty liver disease, nonalcoholic     History   Social History  . Marital Status: Divorced    Spouse Name: N/A    Number of Children: 3  . Years of Education: N/A   Occupational History  . RETIRED BUS DRIVER    Social History Main Topics  . Smoking status: Former Smoker -- 1.00 packs/day for 25 years    Types: Cigarettes    Quit date: 01/05/1982  . Smokeless tobacco: Never Used  . Alcohol Use: No  . Drug Use: No  . Sexual Activity: Not on file   Other Topics Concern  . 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    Past Surgical History  Procedure Laterality Date  . Eye surgery  08/2009    cataract removal left eye--Dr Katy Fitch  . Melanoma excision        left forearm    Family History  Problem Relation Age of Onset  . Arthritis Mother   . Heart disease Sister     Massive MI age 66.  . Arrhythmia Brother   . Melanoma Son   . Heart attack Brother     No Known Allergies  Current Outpatient Prescriptions on File Prior to Visit  Medication Sig Dispense Refill  . acetaminophen (TYLENOL) 500 MG tablet Take 1,000 mg by mouth every 8 (eight) hours as needed for mild pain.      [name] aspirin EC 81 MG tablet Take 81 mg by mouth every morning.       . diazepam (VALIUM) 10 MG tablet Take 1 tablet (10 mg total) by mouth at bedtime as needed for anxiety.  30 tablet  0  . diltiazem (CARDIZEM CD) 180 MG 24 hr capsule Take 1 capsule (180 mg total) by mouth daily.  30 capsule  3  . fexofenadine (ALLEGRA) 180 MG tablet Take 180 mg by mouth every morning.      . fish oil-omega-3 fatty acids 1000 MG capsule Take 2 g  by mouth 2 (two) times daily.        . hydrochlorothiazide (HYDRODIURIL) 25 MG tablet Take 1 tablet (25 mg total) by mouth daily.  30 tablet  5  . insulin glargine (LANTUS) 100 units/mL SOLN Inject 16 Units into the skin at bedtime.       [name] losartan (COZAAR) 100 MG tablet Take 100 mg by mouth every morning.       . metFORMIN (GLUCOPHAGE) 500 MG tablet Take 1,000 mg by mouth 2 (two) times daily with a meal.       . nitroGLYCERIN (NITROSTAT) 0.4 MG SL tablet Place 1 tablet (0.4 mg total) under the tongue every 5 (five) minutes x 3 doses as needed for chest pain (donot take with viagra).  60 tablet  12  . ondansetron (ZOFRAN ODT) 8 MG disintegrating tablet Take 1 tablet (8 mg total) by mouth every 8 (eight) hours as needed for nausea or vomiting.  10 tablet  0  . oxyCODONE-acetaminophen (PERCOCET/ROXICET) 5-325 MG per tablet Take 1 tablet by mouth every 4 (four) hours as needed for severe pain.  25 tablet  0  . PARoxetine (PAXIL) 30 MG tablet Take 30 mg by mouth every morning.       . pioglitazone (ACTOS) 30 MG tablet Take 1 tablet (30 mg total) by  mouth daily.  30 tablet  3  . tamsulosin (FLOMAX) 0.4 MG CAPS capsule Take 1 capsule (0.4 mg total) by mouth daily.  10 capsule  0   No current facility-administered medications on file prior to visit.    BP 120/64  Pulse 71  Temp(Src) 97.9 F (36.6 C) (Oral)  Ht 5\' 10"  (1.778 m)  Wt 285 lb (129.275 kg)  BMI 40.89 kg/m2  SpO2 95%       Objective:   Physical Exam  Constitutional: He is oriented to person, place, and time. He appears well-developed and well-nourished. No distress.  Cardiovascular: Normal rate and regular rhythm.   No murmur heard. Pulmonary/Chest: Effort normal and breath sounds normal. No respiratory distress. He has no wheezes. He has no rales.  Musculoskeletal: He exhibits no edema.  Neurological: He is alert and oriented to person, place, and time.  Psychiatric: He has a normal mood and affect. His behavior is normal. Judgment and thought content normal.          Assessment & Plan:

## 2013-02-02 ENCOUNTER — Telehealth: Payer: Self-pay | Admitting: Family

## 2013-02-02 NOTE — Telephone Encounter (Signed)
Requesting lab results, requesting call back today

## 2013-02-03 NOTE — Telephone Encounter (Signed)
Left detailed message on voicemail and to call if further questions. 

## 2013-02-03 NOTE — Telephone Encounter (Signed)
Kidney function is improved

## 2013-02-06 ENCOUNTER — Telehealth: Payer: Self-pay | Admitting: *Deleted

## 2013-02-06 MED ORDER — INSULIN GLARGINE 100 UNITS/ML SOLOSTAR PEN: 16.0000 [IU] | PEN_INJECTOR | Freq: Every day | SUBCUTANEOUS | Status: DC

## 2013-02-06 MED ORDER — PAROXETINE HCL 30 MG PO TABS: 30.0000 mg | ORAL_TABLET | Freq: Every morning | ORAL | Status: DC

## 2013-02-06 MED ORDER — DILTIAZEM HCL ER COATED BEADS 180 MG PO CP24: 180.0000 mg | ORAL_CAPSULE | Freq: Every day | ORAL | Status: DC

## 2013-02-06 MED ORDER — HYDROCHLOROTHIAZIDE 25 MG PO TABS: 25.0000 mg | ORAL_TABLET | Freq: Every day | ORAL | Status: DC

## 2013-02-06 MED ORDER — TAMSULOSIN HCL 0.4 MG PO CAPS: 0.4000 mg | ORAL_CAPSULE | Freq: Every day | ORAL | Status: DC

## 2013-02-06 MED ORDER — PIOGLITAZONE HCL 30 MG PO TABS: 30.0000 mg | ORAL_TABLET | Freq: Every day | ORAL | Status: DC

## 2013-02-06 MED ORDER — LOSARTAN POTASSIUM 100 MG PO TABS: 100.0000 mg | ORAL_TABLET | Freq: Every morning | ORAL | Status: DC

## 2013-02-06 MED ORDER — METFORMIN HCL 500 MG PO TABS: 1000.0000 mg | ORAL_TABLET | Freq: Two times a day (BID) | ORAL | Status: DC

## 2013-02-06 NOTE — Telephone Encounter (Signed)
Pt called requesting refill on all medications except hydrocodone and diazepam. Pt also requested atorvastatin. Advised pt that the medication had been removed from his medication list with a notation that "pt had not taken medication in the last 30 days".  Pt did not seem to know anything about this.  Is it ok to send refill on atorvastatin as well?

## 2013-02-22 ENCOUNTER — Other Ambulatory Visit: Payer: Self-pay | Admitting: Family

## 2013-02-22 NOTE — Telephone Encounter (Signed)
Please advise refill?  Last RX was done on 10-26-12 quantity 30 with 0 refills  If ok fax to [phone]

## 2013-02-22 NOTE — Telephone Encounter (Signed)
Patient is requesting a new rx of diazepam

## 2013-02-23 NOTE — Telephone Encounter (Signed)
Patient states that he is almost out. That Lenna Sciara had changed the directions to one every 6 hours instead of two a day.

## 2013-02-23 NOTE — Telephone Encounter (Signed)
OK to send #30 one tablet at bedtime as needed.  I did not change instructions.

## 2013-02-24 ENCOUNTER — Encounter: Payer: Self-pay | Admitting: Cardiovascular Disease

## 2013-02-27 MED ORDER — DIAZEPAM 10 MG PO TABS
10.0000 mg | ORAL_TABLET | Freq: Every evening | ORAL | Status: DC | PRN
Start: ? — End: 2013-03-27

## 2013-02-27 NOTE — Telephone Encounter (Signed)
RX faxed

## 2013-02-28 ENCOUNTER — Telehealth: Payer: Self-pay | Admitting: *Deleted

## 2013-02-28 MED ORDER — HYDROCODONE-ACETAMINOPHEN 7.5-325 MG PO TABS
1.0000 | ORAL_TABLET | Freq: Four times a day (QID) | ORAL | Status: DC | PRN
Start: 2013-02-28 — End: 2013-03-27

## 2013-02-28 NOTE — Telephone Encounter (Signed)
Received message from pt requesting refill of hydrocodone. Last rx printed 01/27/13. Rx printed and forwarded to Provider for signature.

## 2013-02-28 NOTE — Telephone Encounter (Signed)
Rx sent to front desk for pick up and pt has been notified. 

## 2013-03-14 ENCOUNTER — Telehealth: Payer: Self-pay | Admitting: *Deleted

## 2013-03-14 DIAGNOSIS — M545 Low back pain, unspecified: Secondary | ICD-10-CM

## 2013-03-14 NOTE — Telephone Encounter (Signed)
Pt called back and scheduled appt for 03/15/13 at 10:45am.

## 2013-03-14 NOTE — Telephone Encounter (Signed)
Left detailed message on cell# re: below instructions and to call and arrange appointment if he wants further work up of his spine.

## 2013-03-14 NOTE — Telephone Encounter (Signed)
I will refer to pain clinic and defer med changes to them.  In the meantime, we can see him back in the office and consider MRI to further evaluate his spine.

## 2013-03-14 NOTE — Telephone Encounter (Signed)
Pt called stating his lower back pain has worsened and wanted to know what else he could do?  Per last office note of January, Provider recommended that pt meet with pain management and he declined at that time.  States he would be willing to proceed with referral at this time. Pt has appt scheduled for 04/17/13. Should we bring him in earlier to assess?

## 2013-03-15 ENCOUNTER — Encounter: Payer: Self-pay | Admitting: Family

## 2013-03-15 ENCOUNTER — Ambulatory Visit (INDEPENDENT_AMBULATORY_CARE_PROVIDER_SITE_OTHER): Payer: Medicare Other | Admitting: Family

## 2013-03-15 VITALS — BP 108/68 | HR 78 | Temp 97.8°F | Resp 20 | Ht 70.0 in | Wt 291.1 lb

## 2013-03-15 DIAGNOSIS — G8929 Other chronic pain: Secondary | ICD-10-CM | POA: Insufficient documentation

## 2013-03-15 DIAGNOSIS — M545 Low back pain, unspecified: Secondary | ICD-10-CM

## 2013-03-15 NOTE — Patient Instructions (Signed)
Call if symptoms do not continue to improve.

## 2013-03-15 NOTE — Progress Notes (Signed)
Pre visit review using our clinic review tool, if applicable. No additional management support is needed unless otherwise documented below in the visit note. 

## 2013-03-15 NOTE — Assessment & Plan Note (Signed)
Improving, and approaching his baseline.  He is comfortable continue current dose of hydrocodone.  Monitor.

## 2013-03-15 NOTE — Progress Notes (Addendum)
Subjective:    Patient ID: Christian Morris, male    DOB: 12-03-1940, 73 y.o.   MRN: [id number]  HPI  Christian Morris is a 73 yr old male who presents today with chief complaint of back pain. [facility]. Sounds like it was the seated abdominal machine. He had the weight set at 150 pounds. Denies LE numbness/weakness other than left knee pain which is chronic.  Reports significant improvement in the pain today.  He declines referral to PT.      Review of Systems See HPI  Past Medical History  Diagnosis Date  . Arthritis   . Depression   . Diabetes mellitus   . Hypertension   . Hyperlipidemia   . History of gastric ulcer     Age 14- no recurrence since.  . Transfusion history     due to bleeding ulcer at age 70  . Hypogonadism male 01/11/2011  . Squamous cell carcinoma in situ of skin of forearm 01/23/2011  . Lung nodule     right 16 mm, seen initially 6/12. 24mm in 08/2011.  [name] GERD (gastroesophageal reflux disease)   . Headache(784.0)   . Fatty liver disease, nonalcoholic     History   Social History  . Marital Status: Divorced    Spouse Name: N/A    Number of Children: 3  . Years of Education: N/A   Occupational History  . RETIRED BUS DRIVER    Social History Main Topics  . Smoking status: Former Smoker -- 1.00 packs/day for 25 years    Types: Cigarettes    Quit date: 01/05/1982  . Smokeless tobacco: Never Used  . Alcohol Use: No  . Drug Use: No  . Sexual Activity: Not on file   Other Topics Concern  . 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    Past Surgical History  Procedure Laterality Date  . Eye surgery  08/2009    cataract removal left eye--Dr Katy Fitch  . Melanoma excision      left forearm    Family History  Problem Relation Age of Onset  . Arthritis Mother   . Heart disease Sister     Massive MI age 4.  .  Arrhythmia Brother   . Melanoma Son   . Heart attack Brother     No Known Allergies  Current Outpatient Prescriptions on File Prior to Visit  Medication Sig Dispense Refill  . acetaminophen (TYLENOL) 500 MG tablet Take 1,000 mg by mouth every 8 (eight) hours as needed for mild pain.      [name] aspirin EC 81 MG tablet Take 81 mg by mouth every morning.       . diazepam (VALIUM) 10 MG tablet Take 1 tablet (10 mg total) by mouth at bedtime as needed for anxiety.  30 tablet  0  . diltiazem (CARDIZEM CD) 180 MG 24 hr capsule Take 1 capsule (180 mg total) by mouth daily.  30 capsule  3  . fexofenadine (ALLEGRA) 180 MG tablet Take 180 mg by mouth every morning.      . fish oil-omega-3 fatty acids 1000 MG capsule Take 2 g by mouth 2 (two) times daily.        . hydrochlorothiazide (HYDRODIURIL) 25 MG tablet Take 1 tablet (25 mg total) by mouth daily.  30 tablet  3  . HYDROcodone-acetaminophen (NORCO) 7.5-325 MG per tablet Take 1 tablet by mouth every 6 (six) hours as needed for moderate pain.  90 tablet  0  . insulin glargine (LANTUS) 100 units/mL SOLN Inject 16 Units into the skin at bedtime.  15 mL  3  . losartan (COZAAR) 100 MG tablet Take 1 tablet (100 mg total) by mouth every morning.  30 tablet  3  . metFORMIN (GLUCOPHAGE) 500 MG tablet Take 2 tablets (1,000 mg total) by mouth 2 (two) times daily with a meal.  60 tablet  3  . nitroGLYCERIN (NITROSTAT) 0.4 MG SL tablet Place 1 tablet (0.4 mg total) under the tongue every 5 (five) minutes x 3 doses as needed for chest pain (donot take with viagra).  60 tablet  12  . ondansetron (ZOFRAN ODT) 8 MG disintegrating tablet Take 1 tablet (8 mg total) by mouth every 8 (eight) hours as needed for nausea or vomiting.  10 tablet  0  . oxyCODONE-acetaminophen (PERCOCET/ROXICET) 5-325 MG per tablet Take 1 tablet by mouth every 4 (four) hours as needed for severe pain.  25 tablet  0  . PARoxetine (PAXIL) 30 MG tablet Take 1 tablet (30 mg total) by mouth every  morning.  30 tablet  3  . pioglitazone (ACTOS) 30 MG tablet Take 1 tablet (30 mg total) by mouth daily.  30 tablet  3  . tamsulosin (FLOMAX) 0.4 MG CAPS capsule Take 1 capsule (0.4 mg total) by mouth daily.  30 capsule  3   No current facility-administered medications on file prior to visit.    BP 108/68  Pulse 78  Temp(Src) 97.8 F (36.6 C) (Oral)  Resp 20  Ht 5\' 10"  (1.778 m)  Wt 291 lb 1.3 oz (132.033 kg)  BMI 41.77 kg/m2  SpO2 95%       Objective:   Physical Exam  Constitutional: He is oriented to person, place, and time. He appears well-developed and well-nourished. No distress.  Cardiovascular: Normal rate and regular rhythm.   No murmur heard. Pulmonary/Chest: Effort normal and breath sounds normal. No respiratory distress. He has no wheezes. He has no rales. He exhibits no tenderness.  Musculoskeletal: He exhibits no edema.  Bilateral LE strength is 5/5  Neurological: He is alert and oriented to person, place, and time.  Reflex Scores:      Patellar reflexes are 2+ on the right side and 2+ on the left side.         Assessment & Plan:  Pt is advised to avoid the machine at the gym which caused his back pain.

## 2013-03-21 ENCOUNTER — Telehealth: Payer: Self-pay | Admitting: Family

## 2013-03-21 NOTE — Telephone Encounter (Signed)
Spoke with pt and advised him that we have not called him. He states he received a message from me about pain management referral and possible MRI.  I explained to pt that message was from 03/14/13 and he called me back immediately after I left him the message that day and he told me he had not listened to my message at that time. I advised pt that we brought him in for evaluation on 03/15/13 to see if MRI would be needed. Pain management referral was placed on 03/14/13 and we are waiting to hear back from them re: appt date/time. Pt states his back pain is improved some and thinks that is due to using the hot tub at the [facility]. Advised pt we would cancel appt on 03/22/13 since his pain is some better and he should keep routine follow up on 04/17/13. Pt voices understanding.

## 2013-03-21 NOTE — Telephone Encounter (Signed)
Patient states that he is returning our call regarding his back problems. Please call patient at [phone]

## 2013-03-22 ENCOUNTER — Ambulatory Visit: Payer: Medicare Other | Admitting: Family

## 2013-03-24 ENCOUNTER — Telehealth: Payer: Self-pay | Admitting: *Deleted

## 2013-03-24 NOTE — Telephone Encounter (Signed)
Notified pt and he voices understanding.  Pt also states that we left him a message this morning re: an appt. Spoke with [facility] and scheduler and neither of them called pt.  Gave pt # for Dr Jodene Nam office and asked him to see if they left the message.

## 2013-03-24 NOTE — Telephone Encounter (Signed)
Pt left message requesting refill of diazepam and lortab.  Diazepam rx last printed 10/27/12 and lortab last printed 02/28/13. Please advise re: refills.

## 2013-03-24 NOTE — Telephone Encounter (Signed)
Both can be refilled on 3/24.

## 2013-03-27 ENCOUNTER — Telehealth: Payer: Self-pay | Admitting: *Deleted

## 2013-03-27 MED ORDER — DIAZEPAM 10 MG PO TABS: 10.0000 mg | ORAL_TABLET | Freq: Every evening | ORAL | Status: DC | PRN

## 2013-03-27 MED ORDER — HYDROCODONE-ACETAMINOPHEN 7.5-325 MG PO TABS: 1.0000 | ORAL_TABLET | Freq: Four times a day (QID) | ORAL | Status: DC | PRN

## 2013-03-27 NOTE — Telephone Encounter (Signed)
Rxs sent to front desk and detailed message left on pt's cell#.

## 2013-03-27 NOTE — Telephone Encounter (Signed)
Pt called wanting to pick up rxs for his lortab and diazepam today. Rxs due to be filled 03/28/13.  Please advise.

## 2013-03-27 NOTE — Telephone Encounter (Signed)
OK 

## 2013-03-27 NOTE — Telephone Encounter (Signed)
Rxs printed and forwarded to Provider for signature. 

## 2013-03-30 ENCOUNTER — Telehealth: Payer: Self-pay | Admitting: *Deleted

## 2013-03-30 NOTE — Telephone Encounter (Signed)
Christian Morris from [place] lab called and stated that patient brought his stool into the office in a bag instead of in the tube. Patient will need to re-collect sample. Please advise. JG//CMA

## 2013-03-31 ENCOUNTER — Other Ambulatory Visit: Payer: Medicare Other

## 2013-03-31 DIAGNOSIS — D649 Anemia, unspecified: Secondary | ICD-10-CM

## 2013-03-31 LAB — FECAL OCCULT BLOOD, IMMUNOCHEMICAL: FECAL OCCULT BLD: NEGATIVE

## 2013-04-01 ENCOUNTER — Encounter: Payer: Self-pay | Admitting: Family

## 2013-04-03 NOTE — Telephone Encounter (Signed)
IFOB result is final in EPIC as of 03/31/13. Letter mailed to pt.

## 2013-04-12 ENCOUNTER — Encounter: Payer: Self-pay | Admitting: Family

## 2013-04-12 ENCOUNTER — Ambulatory Visit (INDEPENDENT_AMBULATORY_CARE_PROVIDER_SITE_OTHER): Payer: Medicare Other | Admitting: Family

## 2013-04-12 ENCOUNTER — Telehealth: Payer: Self-pay | Admitting: Family

## 2013-04-12 ENCOUNTER — Ambulatory Visit (HOSPITAL_BASED_OUTPATIENT_CLINIC_OR_DEPARTMENT_OTHER)
Admission: RE | Admit: 2013-04-12 | Discharge: 2013-04-12 | Disposition: A | Discharge status: Home / Self Care | Payer: Medicare Other | Source: Ambulatory Visit | Attending: Family | Admitting: Family

## 2013-04-12 ENCOUNTER — Ambulatory Visit: Payer: Medicare Other | Admitting: Physician Assistant

## 2013-04-12 VITALS — BP 108/74 | HR 64 | Temp 98.3°F | Resp 18 | Ht 70.0 in | Wt 288.0 lb

## 2013-04-12 DIAGNOSIS — R059 Cough, unspecified: Secondary | ICD-10-CM | POA: Insufficient documentation

## 2013-04-12 DIAGNOSIS — R05 Cough: Secondary | ICD-10-CM

## 2013-04-12 DIAGNOSIS — J209 Acute bronchitis, unspecified: Secondary | ICD-10-CM

## 2013-04-12 MED ORDER — MOXIFLOXACIN HCL 400 MG PO TABS
400.0000 mg | ORAL_TABLET | Freq: Every day | ORAL | Status: DC
Start: 2013-04-12 — End: 2013-04-12

## 2013-04-12 MED ORDER — BENZONATATE 100 MG PO CAPS: 100.0000 mg | ORAL_CAPSULE | Freq: Three times a day (TID) | ORAL | Status: DC | PRN

## 2013-04-12 MED ORDER — MOXIFLOXACIN HCL 400 MG PO TABS: 400.0000 mg | ORAL_TABLET | Freq: Every day | ORAL | Status: DC

## 2013-04-12 NOTE — Progress Notes (Signed)
Pre visit review using our clinic review tool, if applicable. No additional management support is needed unless otherwise documented below in the visit note. 

## 2013-04-12 NOTE — Patient Instructions (Signed)
Please complete chest x ray on the first floor. Follow up in 1 week.

## 2013-04-12 NOTE — Telephone Encounter (Signed)
Please contact pt. CXR does not show definite pneumonia, but I would like to go ahead and treat him anyhow based on his exam and symptoms.  Rx sent downstairs for tessalon prn and avelox.  Call if cough worsens or if it does not improve.

## 2013-04-12 NOTE — Progress Notes (Signed)
Subjective:    Patient ID: Christian Morris, male    DOB: Dec 17, 1940, 73 y.o.   MRN: [id number]  HPI  Christian Morris is a 73 yr old male who presents today with chief complaint of cough.  Cough has been present x 2 days and is worsening. He reports subjective fever.  Cough is productive at times.  Denies associated nasal congestion or sore throat.    Review of Systems    see HPI  Past Medical History  Diagnosis Date  . Arthritis   . Depression   . Diabetes mellitus   . Hypertension   . Hyperlipidemia   . History of gastric ulcer     Age 73- no recurrence since.  . Transfusion history     due to bleeding ulcer at age 23  . Hypogonadism male 01/11/2011  . Squamous cell carcinoma in situ of skin of forearm 01/23/2011  . Lung nodule     right 16 mm, seen initially 6/12. 61mm in 08/2011.  [name] GERD (gastroesophageal reflux disease)   . Headache(784.0)   . Fatty liver disease, nonalcoholic     History   Social History  . Marital Status: Divorced    Spouse Name: N/A    Number of Children: 3  . Years of Education: N/A   Occupational History  . RETIRED BUS DRIVER    Social History Main Topics  . Smoking status: Former Smoker -- 1.00 packs/day for 25 years    Types: Cigarettes    Quit date: 01/05/1982  . Smokeless tobacco: Never Used  . Alcohol Use: No  . Drug Use: No  . Sexual Activity: Not on file   Other Topics Concern  . 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    Past Surgical History  Procedure Laterality Date  . Eye surgery  08/2009    cataract removal left eye--Dr Katy Fitch  . Melanoma excision      left forearm    Family History  Problem Relation Age of Onset  . Arthritis Mother   . Heart disease Sister     Massive MI age 58.  . Arrhythmia Brother   . Melanoma Son   . Heart attack Brother     No Known Allergies  Current Outpatient Prescriptions on File Prior to Visit    Medication Sig Dispense Refill  . acetaminophen (TYLENOL) 500 MG tablet Take 1,000 mg by mouth every 8 (eight) hours as needed for mild pain.      [name] aspirin EC 81 MG tablet Take 81 mg by mouth every morning.       . diazepam (VALIUM) 10 MG tablet Take 1 tablet (10 mg total) by mouth at bedtime as needed for anxiety.  30 tablet  0  . diltiazem (CARDIZEM CD) 180 MG 24 hr capsule Take 1 capsule (180 mg total) by mouth daily.  30 capsule  3  . fexofenadine (ALLEGRA) 180 MG tablet Take 180 mg by mouth every morning.      . fish oil-omega-3 fatty acids 1000 MG capsule Take 2 g by mouth 2 (two) times daily.        . hydrochlorothiazide (HYDRODIURIL) 25 MG tablet Take 1 tablet (25 mg total) by mouth daily.  30 tablet  3  . HYDROcodone-acetaminophen (NORCO) 7.5-325 MG per tablet Take 1 tablet by mouth every 6 (six) hours as needed for moderate pain.  90 tablet  0  . insulin glargine (LANTUS) 100 units/mL SOLN Inject 16 Units into the skin at bedtime.  15 mL  3  . losartan (COZAAR) 100 MG tablet Take 1 tablet (100 mg total) by mouth every morning.  30 tablet  3  . metFORMIN (GLUCOPHAGE) 500 MG tablet Take 2 tablets (1,000 mg total) by mouth 2 (two) times daily with a meal.  60 tablet  3  . nitroGLYCERIN (NITROSTAT) 0.4 MG SL tablet Place 1 tablet (0.4 mg total) under the tongue every 5 (five) minutes x 3 doses as needed for chest pain (donot take with viagra).  60 tablet  12  . PARoxetine (PAXIL) 30 MG tablet Take 1 tablet (30 mg total) by mouth every morning.  30 tablet  3  . pioglitazone (ACTOS) 30 MG tablet Take 1 tablet (30 mg total) by mouth daily.  30 tablet  3  . tamsulosin (FLOMAX) 0.4 MG CAPS capsule Take 1 capsule (0.4 mg total) by mouth daily.  30 capsule  3   No current facility-administered medications on file prior to visit.    BP 108/74  Pulse 64  Temp(Src) 98.3 F (36.8 C) (Oral)  Resp 18  Ht 5\' 10"  (1.778 m)  Wt 288 lb 0.6 oz (130.654 kg)  BMI 41.33 kg/m2  SpO2  96%    Objective:   Physical Exam  Constitutional: He is oriented to person, place, and time. He appears well-developed and well-nourished. No distress.  HENT:  Head: Normocephalic and atraumatic.  Right Ear: Tympanic membrane and ear canal normal.  Left Ear: Tympanic membrane and ear canal normal.  Cardiovascular: Normal rate and regular rhythm.   No murmur heard. Pulmonary/Chest: Effort normal. No respiratory distress. He has no wheezes. He has rhonchi in the right lower field and the left lower field. He exhibits no tenderness.  Neurological: He is alert and oriented to person, place, and time.  Skin: Skin is warm and dry.  Psychiatric: He has a normal mood and affect. His behavior is normal. Thought content normal.          Assessment & Plan:

## 2013-04-12 NOTE — Telephone Encounter (Signed)
Notified pt and he voices understanding. He requests that rxs be sent to [place] at [facility]. Cancelled rxs at [facility] and re-sent to [place].

## 2013-04-13 ENCOUNTER — Ambulatory Visit: Payer: Medicare Other | Admitting: Physician Assistant

## 2013-04-16 DIAGNOSIS — J209 Acute bronchitis, unspecified: Secondary | ICD-10-CM | POA: Insufficient documentation

## 2013-04-16 NOTE — Assessment & Plan Note (Signed)
CXR notes Linear opacities at the lung bases are likely due to scarring. Given symptoms, will plan rx with avelox.  Tessalon prn cough.

## 2013-04-17 ENCOUNTER — Ambulatory Visit (INDEPENDENT_AMBULATORY_CARE_PROVIDER_SITE_OTHER): Payer: Medicare Other | Admitting: Family

## 2013-04-17 ENCOUNTER — Encounter: Payer: Self-pay | Admitting: Family

## 2013-04-17 VITALS — BP 110/62 | HR 58 | Temp 98.4°F | Ht 70.0 in | Wt 288.1 lb

## 2013-04-17 DIAGNOSIS — Z9109 Other allergy status, other than to drugs and biological substances: Secondary | ICD-10-CM

## 2013-04-17 DIAGNOSIS — I10 Essential (primary) hypertension: Secondary | ICD-10-CM

## 2013-04-17 DIAGNOSIS — J209 Acute bronchitis, unspecified: Secondary | ICD-10-CM

## 2013-04-17 DIAGNOSIS — E785 Hyperlipidemia, unspecified: Secondary | ICD-10-CM

## 2013-04-17 DIAGNOSIS — E119 Type 2 diabetes mellitus without complications: Secondary | ICD-10-CM

## 2013-04-17 NOTE — Patient Instructions (Addendum)
Start flonase 2 sprays each nostril once daily. Start claritin 10mg  once daily. Return fasting to the lab Wednesday or Thursday of this week. Call if nasal congestion worsens or if it does not improve. You will be contacted about your referral to the eye doctor. Follow up in 3 months.

## 2013-04-17 NOTE — Assessment & Plan Note (Signed)
Clinically stable on current meds. Refer for DM eye exam, obtain A1C.

## 2013-04-17 NOTE — Assessment & Plan Note (Signed)
Resolved. Lung exam clear today.

## 2013-04-17 NOTE — Assessment & Plan Note (Signed)
BP stable on current meds.   

## 2013-04-17 NOTE — Progress Notes (Signed)
Subjective:    Patient ID: Christian Morris, male    DOB: 11-May-1940, 73 y.o.   MRN: [id number]  HPI  Christian Morris is a 73 yr old male who presents today for follow up.  1) Bronchitis- was given avelox for bronchitis last visit.  Reports + nasal congestion, coughing is improving. Mild tightness in his breathing.  He completed   2) DM2- Currently maintained on metformin, actos, and lantus.  Has not been checking sugars because he has not had a meter.  Lab Results  Component Value Date   HGBA1C 7.1* 01/17/2013   3) HTN-  Maintained on diltiazem, losartan.  BP Readings from Last 3 Encounters:  04/17/13 110/62  04/12/13 108/74  03/15/13 108/68   4) Hyperlipidemia-  Not currently on statin.    Review of Systems See HPI  Past Medical History  Diagnosis Date  . Arthritis   . Depression   . Diabetes mellitus   . Hypertension   . Hyperlipidemia   . History of gastric ulcer     Age 49- no recurrence since.  . Transfusion history     due to bleeding ulcer at age 63  . Hypogonadism male 01/11/2011  . Squamous cell carcinoma in situ of skin of forearm 01/23/2011  . Lung nodule     right 16 mm, seen initially 6/12. 62mm in 08/2011.  [name] GERD (gastroesophageal reflux disease)   . Headache(784.0)   . Fatty liver disease, nonalcoholic     History   Social History  . Marital Status: Divorced    Spouse Name: N/A    Number of Children: 3  . Years of Education: N/A   Occupational History  . RETIRED BUS DRIVER    Social History Main Topics  . Smoking status: Former Smoker -- 1.00 packs/day for 25 years    Types: Cigarettes    Quit date: 01/05/1982  . Smokeless tobacco: Never Used  . Alcohol Use: No  . Drug Use: No  . Sexual Activity: Not on file   Other Topics Concern  . 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    Past Surgical History  Procedure Laterality Date  . Eye surgery  08/2009   cataract removal left eye--Dr Katy Fitch  . Melanoma excision      left forearm    Family History  Problem Relation Age of Onset  . Arthritis Mother   . Heart disease Sister     Massive MI age 73.  . Arrhythmia Brother   . Melanoma Son   . Heart attack Brother     No Known Allergies  Current Outpatient Prescriptions on File Prior to Visit  Medication Sig Dispense Refill  . acetaminophen (TYLENOL) 500 MG tablet Take 1,000 mg by mouth every 8 (eight) hours as needed for mild pain.      [name] aspirin EC 81 MG tablet Take 81 mg by mouth every morning.       . benzonatate (TESSALON) 100 MG capsule Take 1 capsule (100 mg total) by mouth 3 (three) times daily as needed for cough.  20 capsule  0  . diazepam (VALIUM) 10 MG tablet Take 1 tablet (10 mg total) by mouth at bedtime as needed for anxiety.  30 tablet  0  . diltiazem (CARDIZEM CD) 180 MG 24 hr capsule Take 1 capsule (180 mg total) by mouth daily.  30 capsule  3  . fexofenadine (ALLEGRA) 180 MG tablet Take 180 mg by mouth every morning.      . fish oil-omega-3 fatty acids 1000 MG capsule Take 2 g by mouth 2 (two) times daily.        . hydrochlorothiazide (HYDRODIURIL) 25 MG tablet Take 1 tablet (25 mg total) by mouth daily.  30 tablet  3  . HYDROcodone-acetaminophen (NORCO) 7.5-325 MG per tablet Take 1 tablet by mouth every 6 (six) hours as needed for moderate pain.  90 tablet  0  . insulin glargine (LANTUS) 100 units/mL SOLN Inject 16 Units into the skin at bedtime.  15 mL  3  . losartan (COZAAR) 100 MG tablet Take 1 tablet (100 mg total) by mouth every morning.  30 tablet  3  . metFORMIN (GLUCOPHAGE) 500 MG tablet Take 2 tablets (1,000 mg total) by mouth 2 (two) times daily with a meal.  60 tablet  3  . moxifloxacin (AVELOX) 400 MG tablet Take 1 tablet (400 mg total) by mouth daily.  7 tablet  0  . nitroGLYCERIN (NITROSTAT) 0.4 MG SL tablet Place 1 tablet (0.4 mg total) under the tongue every 5 (five) minutes x 3 doses as needed for chest  pain (donot take with viagra).  60 tablet  12  . PARoxetine (PAXIL) 30 MG tablet Take 1 tablet (30 mg total) by mouth every morning.  30 tablet  3  . pioglitazone (ACTOS) 30 MG tablet Take 1 tablet (30 mg total) by mouth daily.  30 tablet  3  . tamsulosin (FLOMAX) 0.4 MG CAPS capsule Take 1 capsule (0.4 mg total) by mouth daily.  30 capsule  3   No current facility-administered medications on file prior to visit.    BP 110/62  Pulse 58  Temp(Src) 98.4 F (36.9 C) (Oral)  Ht 5\' 10"  (1.778 m)  Wt 288 lb 1.3 oz (130.672 kg)  BMI 41.34 kg/m2  SpO2 96%       Objective:   Physical Exam  Constitutional: He is oriented to person, place, and time. He appears well-developed and well-nourished. No distress.  HENT:  Head: Normocephalic and atraumatic.  Cardiovascular: Normal rate and regular rhythm.   No murmur heard. Pulmonary/Chest: Effort normal and breath sounds normal. No respiratory distress. He has no wheezes. He has no rales. He exhibits no tenderness.  Musculoskeletal: He exhibits no edema.  Neurological: He is alert and oriented to person, place, and time.  Psychiatric: He has a normal mood and affect. His behavior is normal. Judgment and thought content normal.          Assessment & Plan:

## 2013-04-17 NOTE — Assessment & Plan Note (Signed)
Pt will return for flp.

## 2013-04-17 NOTE — Assessment & Plan Note (Signed)
Add flonase and claritin for nasal congestion.

## 2013-04-18 ENCOUNTER — Telehealth: Payer: Self-pay | Admitting: Family

## 2013-04-18 NOTE — Telephone Encounter (Signed)
Relevant patient education assigned to patient using Emmi. ° °

## 2013-04-19 ENCOUNTER — Telehealth: Payer: Self-pay | Admitting: *Deleted

## 2013-04-19 LAB — BASIC METABOLIC PANEL WITH GFR
BUN: 20 mg/dL (ref 6–23)
CO2: 29 mEq/L (ref 19–32)
CREATININE: 1.19 mg/dL (ref 0.50–1.35)
Calcium: 9.3 mg/dL (ref 8.4–10.5)
Chloride: 103 mEq/L (ref 96–112)
GFR, EST AFRICAN AMERICAN: 69 mL/min
GFR, Est Non African American: 60 mL/min
GLUCOSE: 95 mg/dL (ref 70–99)
Potassium: 4.1 mEq/L (ref 3.5–5.3)
Sodium: 140 mEq/L (ref 135–145)

## 2013-04-19 LAB — LIPID PANEL
CHOLESTEROL: 213 mg/dL — AB (ref 0–200)
HDL: 35 mg/dL — ABNORMAL LOW (ref >39)
LDL Cholesterol: 109 mg/dL — ABNORMAL HIGH (ref 0–99)
Total CHOL/HDL Ratio: 6.1 Ratio
Triglycerides: 346 mg/dL — ABNORMAL HIGH (ref <150)
VLDL: 69 mg/dL — ABNORMAL HIGH (ref 0–40)

## 2013-04-19 LAB — HEPATIC FUNCTION PANEL
ALBUMIN: 4.4 g/dL (ref 3.5–5.2)
ALT: 17 U/L (ref 0–53)
AST: 18 U/L (ref 0–37)
Alkaline Phosphatase: 54 U/L (ref 39–117)
Bilirubin, Direct: 0.1 mg/dL (ref 0.0–0.3)
Indirect Bilirubin: 0.6 mg/dL (ref 0.2–1.2)
Total Bilirubin: 0.7 mg/dL (ref 0.2–1.2)
Total Protein: 7.2 g/dL (ref 6.0–8.3)

## 2013-04-19 NOTE — Telephone Encounter (Signed)
Pt presented to the office with written RX we gave him on 04/17/13 for freestyle glucometer, test strips and lancets stating the pharmacy told him he needs to get his meter from [facility]. Spoke with pharmacist and was told that rx requires Provider's NPI # as rx is going through medicare part D. NPI # added to Rx and given to pt to take back to pharmacy.

## 2013-04-20 ENCOUNTER — Telehealth: Payer: Self-pay | Admitting: Family

## 2013-04-20 DIAGNOSIS — E785 Hyperlipidemia, unspecified: Secondary | ICD-10-CM

## 2013-04-20 LAB — HEMOGLOBIN A1C
Hgb A1c MFr Bld: 6.8 % — ABNORMAL HIGH (ref <5.7)
Mean Plasma Glucose: 148 mg/dL — ABNORMAL HIGH (ref <117)

## 2013-04-20 MED ORDER — ATORVASTATIN CALCIUM 20 MG PO TABS: 20.0000 mg | ORAL_TABLET | Freq: Every day | ORAL | Status: DC

## 2013-04-20 NOTE — Telephone Encounter (Signed)
Please contact pt and let him know that his cholesterol is elevated.  He has been on atorvastatin in the past.  Restart atovastatin at 20mg  once daily, repeat flp/lft in 6 weeks, dxy hyperlipidemia. Sugar is improved and at goal.

## 2013-04-21 ENCOUNTER — Other Ambulatory Visit: Payer: Self-pay | Admitting: Family

## 2013-04-21 ENCOUNTER — Encounter: Payer: Self-pay | Admitting: Family

## 2013-04-21 NOTE — Telephone Encounter (Signed)
Notified pt and he voices understanding. Lab order entered. 

## 2013-04-24 ENCOUNTER — Telehealth: Payer: Self-pay | Admitting: Family

## 2013-04-24 MED ORDER — HYDROCODONE-ACETAMINOPHEN 7.5-325 MG PO TABS: 1.0000 | ORAL_TABLET | Freq: Four times a day (QID) | ORAL | Status: DC | PRN

## 2013-04-24 NOTE — Telephone Encounter (Signed)
Notified pt that rx has been sent to front desk for pick up tomorrow or any day thereafter. Pt voices understanding.

## 2013-04-24 NOTE — Telephone Encounter (Signed)
Last hydrocodone Rx printed 03/27/13. Rx printed and forwarded to Provider for signature.

## 2013-04-24 NOTE — Telephone Encounter (Signed)
patient states that he needs a new rx of pain medicine

## 2013-05-01 ENCOUNTER — Telehealth: Payer: Self-pay

## 2013-05-01 NOTE — Telephone Encounter (Signed)
Relevant patient education assigned to patient using Emmi. ° °

## 2013-05-12 ENCOUNTER — Other Ambulatory Visit: Payer: Self-pay | Admitting: Family

## 2013-05-15 NOTE — Telephone Encounter (Signed)
Pt left message requesting refill. Refill sent. Left detailed message on voicemail that refill was completed.

## 2013-05-22 ENCOUNTER — Telehealth: Payer: Self-pay | Admitting: *Deleted

## 2013-05-22 NOTE — Telephone Encounter (Signed)
Patient returned phone call. °

## 2013-05-22 NOTE — Telephone Encounter (Signed)
Received message from pt stating his glucometer seems to be malfunctioning. States he checked BS today and got 180. Immediately rechecked glucose and got 109. Pt wants to know if we can send in an Rx for a different meter? It appears we had prescribed freestyle test strips in the past. Left message for pt to return my call and let us know what meters he has tried before.

## 2013-05-22 NOTE — Telephone Encounter (Signed)
Please see message below and advise.

## 2013-05-22 NOTE — Telephone Encounter (Signed)
Patient called back stating that he uses the freestyle meter

## 2013-05-23 ENCOUNTER — Telehealth: Payer: Self-pay | Admitting: Family

## 2013-05-23 MED ORDER — HYDROCODONE-ACETAMINOPHEN 7.5-325 MG PO TABS: 1.0000 | ORAL_TABLET | Freq: Four times a day (QID) | ORAL | Status: DC | PRN

## 2013-05-23 MED ORDER — DIAZEPAM 10 MG PO TABS: 10.0000 mg | ORAL_TABLET | Freq: Every evening | ORAL | Status: DC | PRN

## 2013-05-23 NOTE — Telephone Encounter (Signed)
Pt left message requesting refills of diazepam and lortab.  Last diazepam rx 03/27/13, #30.  Last Lortab rx 04/24/13, #90.  Rxs printed and forwarded to Provider for signature.

## 2013-05-23 NOTE — Telephone Encounter (Signed)
See one touch meter/test strips hand written rx.

## 2013-05-23 NOTE — Telephone Encounter (Signed)
Rxs placed at front desk for pick up and pt notified. 

## 2013-05-23 NOTE — Telephone Encounter (Signed)
Rx signed.

## 2013-06-15 ENCOUNTER — Other Ambulatory Visit: Payer: Self-pay | Admitting: Family

## 2013-06-21 ENCOUNTER — Telehealth: Payer: Self-pay | Admitting: Family

## 2013-06-21 DIAGNOSIS — M170 Bilateral primary osteoarthritis of knee: Secondary | ICD-10-CM

## 2013-06-21 DIAGNOSIS — F419 Anxiety disorder, unspecified: Secondary | ICD-10-CM

## 2013-06-21 NOTE — Telephone Encounter (Signed)
Patient states that he needs a new refill of diazepam and the pain medication for his back. He states that he will be out Friday.

## 2013-06-22 MED ORDER — DIAZEPAM 10 MG PO TABS: 10.0000 mg | ORAL_TABLET | Freq: Every evening | ORAL | Status: DC | PRN

## 2013-06-22 MED ORDER — HYDROCODONE-ACETAMINOPHEN 7.5-325 MG PO TABS: 1.0000 | ORAL_TABLET | Freq: Four times a day (QID) | ORAL | Status: DC | PRN

## 2013-06-22 NOTE — Telephone Encounter (Signed)
Requesting RX rf for hydrocodone and diazepam.  Last OV was 04/17/13 Rx last printed 05/23/13 Please advise.

## 2013-06-22 NOTE — Telephone Encounter (Signed)
Patient called back regarding this.

## 2013-06-22 NOTE — Telephone Encounter (Signed)
Refills granted.  Will be ready for pickup by noon.

## 2013-06-26 ENCOUNTER — Other Ambulatory Visit: Payer: Self-pay | Admitting: Family

## 2013-06-26 NOTE — Telephone Encounter (Signed)
Patient states that he needs a refill of his asthma medication but he cannot afford it. He states that this certain medication costs him $58.

## 2013-06-27 NOTE — Telephone Encounter (Signed)
Patient is requesting samples of Lantus. Patient states that he can not afford the cost of Lantus.

## 2013-06-27 NOTE — Telephone Encounter (Signed)
There are no Lantus samples. Patient stated that if there where no samples, he wanted to try something cheaper. Please advise?

## 2013-06-28 ENCOUNTER — Telehealth: Payer: Self-pay | Admitting: *Deleted

## 2013-06-28 MED ORDER — INSULIN NPH (HUMAN) (ISOPHANE) 100 UNIT/ML ~~LOC~~ SUSP: 9.0000 [IU] | Freq: Two times a day (BID) | SUBCUTANEOUS | Status: DC

## 2013-06-28 NOTE — Telephone Encounter (Signed)
Patient was notified. Patient says thank you.

## 2013-06-28 NOTE — Telephone Encounter (Signed)
Patient is requesting samples of Lantus. Patient states that he can not afford the cost of Lantus. There are no Lantus samples. Patient stated that if there where no samples, he wanted to try something cheaper. Patient stated that he is out of his insulin. Please advise?

## 2013-06-28 NOTE — Telephone Encounter (Signed)
He can try NPH instead.  Rx has been sent. NPH is dosed twice daily. He will need 9 units twice daily.

## 2013-07-01 ENCOUNTER — Other Ambulatory Visit: Payer: Self-pay | Admitting: Family

## 2013-07-03 ENCOUNTER — Telehealth: Payer: Self-pay | Admitting: *Deleted

## 2013-07-03 MED ORDER — METFORMIN HCL 500 MG PO TABS: ORAL_TABLET | ORAL | Status: DC

## 2013-07-03 NOTE — Telephone Encounter (Signed)
Received fax from [facility] that pt states he has been taking 3 tablets twice a day and current directions are 2 twice a day. Pharmacy is requesting rx with new directions if pt is correct. Per review of EPIC it appears dose has been 2 tablets twice a day for several months. Left message for pt to return my call.

## 2013-07-03 NOTE — Telephone Encounter (Signed)
Pt called back and states that he has been taking metformin (500mg ) 2 tablets twice a day but pharmacy told him it was too soon to refill rx. Upon review of EPIC, last quantity sent was #60 which would not be sufficient for 30 days. Pt will need #120. Refill sent with adjusted quantity.

## 2013-07-09 ENCOUNTER — Other Ambulatory Visit: Payer: Self-pay | Admitting: Family

## 2013-07-10 ENCOUNTER — Telehealth: Payer: Self-pay | Admitting: *Deleted

## 2013-07-10 NOTE — Telephone Encounter (Signed)
Pt left message that he has been trying to return our calls. Attempted to reach pt and left detailed message that I cannot see that we have been trying to reach him and maybe he has is getting old messages.

## 2013-07-17 ENCOUNTER — Other Ambulatory Visit: Payer: Self-pay | Admitting: Family

## 2013-07-17 ENCOUNTER — Encounter: Payer: Self-pay | Admitting: Family

## 2013-07-17 ENCOUNTER — Ambulatory Visit (INDEPENDENT_AMBULATORY_CARE_PROVIDER_SITE_OTHER): Payer: Medicare Other | Admitting: Family

## 2013-07-17 VITALS — BP 128/80 | HR 52 | Temp 97.6°F | Resp 14 | Ht 72.0 in | Wt 287.0 lb

## 2013-07-17 DIAGNOSIS — N400 Enlarged prostate without lower urinary tract symptoms: Secondary | ICD-10-CM

## 2013-07-17 DIAGNOSIS — E785 Hyperlipidemia, unspecified: Secondary | ICD-10-CM

## 2013-07-17 DIAGNOSIS — I10 Essential (primary) hypertension: Secondary | ICD-10-CM

## 2013-07-17 DIAGNOSIS — R3911 Hesitancy of micturition: Secondary | ICD-10-CM

## 2013-07-17 DIAGNOSIS — E119 Type 2 diabetes mellitus without complications: Secondary | ICD-10-CM

## 2013-07-17 NOTE — Assessment & Plan Note (Signed)
Complains of some urinary hesitancy. Check PSA and urine culture.

## 2013-07-17 NOTE — Progress Notes (Signed)
Subjective:    Patient ID: Christian Morris, male    DOB: 04/14/1940, 73 y.o.   MRN: [id number]  HPI Christian Morris is a 73 yo male here for fu.  DM: Not checking BS at home. Diet: does not follow low fat, low sugar diet. Exercise: walks in water at [facility] 6 days a week for 2 hours. Medicine compliance: takes actos, metformin, NPH twice daily Denies episodes of hypoglycemia. Has eye exam 3-4 months ago. Lab Results  Component Value Date   HGBA1C 6.8* 04/17/2013   HYPERTENSION: BP stable on cardizem, hctz, losartan Medication Compliance: yes Edema: reports occasional bilateral ankle swelling. BP Readings from Last 3 Encounters:  07/17/13 128/80  04/17/13 110/62  04/12/13 108/74   Hyperlipidemia: Medication Compliance: compliant with atorvastatin 20 mg. Myalgias: no  Reports  trouble with urination. Has urgency and then hesitation once he gets started. Review of Systems  HENT: Negative for nosebleeds.   Cardiovascular: Positive for chest pain (Occasional-relieved by prilosec.) and leg swelling. Negative for palpitations.  Gastrointestinal: Negative for blood in stool.  Genitourinary: Positive for urgency and difficulty urinating. Negative for frequency and hematuria.  Musculoskeletal: Negative for myalgias.  Neurological: Negative for dizziness and light-headedness.   Past Medical History  Diagnosis Date  . Arthritis   . Depression   . Diabetes mellitus   . Hypertension   . Hyperlipidemia   . History of gastric ulcer     Age 75- no recurrence since.  . Transfusion history     due to bleeding ulcer at age 14  . Hypogonadism male 01/11/2011  . Squamous cell carcinoma in situ of skin of forearm 01/23/2011  . Lung nodule     right 16 mm, seen initially 6/12. 77mm in 08/2011.  [name] GERD (gastroesophageal reflux disease)   . Headache(784.0)   . Fatty liver disease, nonalcoholic     History   Social History  . Marital Status: Divorced    Spouse Name: N/A    Number of  Children: 3  . Years of Education: N/A   Occupational History  . RETIRED BUS DRIVER    Social History Main Topics  . Smoking status: Former Smoker -- 1.00 packs/day for 25 years    Types: Cigarettes    Quit date: 01/05/1982  . Smokeless tobacco: Never Used  . Alcohol Use: No  . Drug Use: No  . Sexual Activity: Not on file   Other Topics Concern  . 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    Past Surgical History  Procedure Laterality Date  . Eye surgery  08/2009    cataract removal left eye--Dr Katy Fitch  . Melanoma excision      left forearm    Family History  Problem Relation Age of Onset  . Arthritis Mother   . Heart disease Sister     Massive MI age 68.  . Arrhythmia Brother   . Melanoma Son   . Heart attack Brother     No Known Allergies  Current Outpatient Prescriptions on File Prior to Visit  Medication Sig Dispense Refill  . acetaminophen (TYLENOL) 500 MG tablet Take 1,000 mg by mouth every 8 (eight) hours as needed for mild pain.      [name] aspirin EC 81 MG tablet Take 81 mg by mouth every morning.       [name] atorvastatin (LIPITOR) 20 MG tablet Take 1  tablet (20 mg total) by mouth daily.  30 tablet  3  . benzonatate (TESSALON) 100 MG capsule Take 1 capsule (100 mg total) by mouth 3 (three) times daily as needed for cough.  20 capsule  0  . diazepam (VALIUM) 10 MG tablet Take 1 tablet (10 mg total) by mouth at bedtime as needed for anxiety.  30 tablet  0  . diltiazem (CARDIZEM CD) 180 MG 24 hr capsule TAKE ONE CAPSULE BY MOUTH ONCE DAILY  30 capsule  3  . fexofenadine (ALLEGRA) 180 MG tablet Take 180 mg by mouth every morning.      . fish oil-omega-3 fatty acids 1000 MG capsule Take 2 g by mouth 2 (two) times daily.        . fluticasone (FLONASE) 50 MCG/ACT nasal spray Place 2 sprays into both nostrils daily.      . hydrochlorothiazide (HYDRODIURIL) 25 MG tablet TAKE ONE TABLET BY MOUTH  ONCE DAILY  30 tablet  2  . HYDROcodone-acetaminophen (NORCO) 7.5-325 MG per tablet Take 1 tablet by mouth every 6 (six) hours as needed for moderate pain.  90 tablet  0  . insulin NPH Human (HUMULIN N,NOVOLIN N) 100 UNIT/ML injection Inject 0.09 mLs (9 Units total) into the skin 2 (two) times daily before a meal.  10 mL  2  . losartan (COZAAR) 100 MG tablet Take 1 tablet (100 mg total) by mouth every morning.  30 tablet  3  . metFORMIN (GLUCOPHAGE) 500 MG tablet TAKE TWO TABLETS BY MOUTH TWICE DAILY WITH A MEAL  120 tablet  2  . nitroGLYCERIN (NITROSTAT) 0.4 MG SL tablet Place 1 tablet (0.4 mg total) under the tongue every 5 (five) minutes x 3 doses as needed for chest pain (donot take with viagra).  60 tablet  12  . PARoxetine (PAXIL) 30 MG tablet TAKE ONE TABLET BY MOUTH EVERY MORNING  30 tablet  3  . tamsulosin (FLOMAX) 0.4 MG CAPS capsule TAKE ONE CAPSULE BY MOUTH ONCE DAILY  30 capsule  0   No current facility-administered medications on file prior to visit.    BP 128/80  Pulse 52  Temp(Src) 97.6 F (36.4 C) (Oral)  Resp 14  Ht 6' (1.829 m)  Wt 287 lb (130.182 kg)  BMI 38.92 kg/m2  SpO2 98%       Objective:   Physical Exam  Constitutional: He is oriented to person, place, and time. He appears well-nourished. No distress.  Obese.  HENT:  Head: Normocephalic and atraumatic.  Mouth/Throat: Oropharynx is clear and moist. No oropharyngeal exudate.  Eyes: Pupils are equal, round, and reactive to light. No scleral icterus.  Cardiovascular: Regular rhythm, normal heart sounds and intact distal pulses.  Bradycardia present.  Exam reveals no gallop and no friction rub.   No murmur heard. Pulse 52  Pulmonary/Chest: Effort normal and breath sounds normal. No respiratory distress. He has no wheezes. He has no rales.  Musculoskeletal: He exhibits no edema.  Lymphadenopathy:    He has no cervical adenopathy.  Neurological: He is alert and oriented to person, place, and time.  Skin:  Skin is warm and dry. No rash noted. He is not diaphoretic. No erythema.  Psychiatric: He has a normal mood and affect. His behavior is normal.          Assessment & Plan:  Patient seen by [facility] NP-student.  I have personally seen and examined patient and agree with Ms. Whitmire's assessment and plan.

## 2013-07-17 NOTE — Patient Instructions (Addendum)
Please complete lab work prior to leaving. Schedule follow up appointment in 3 months.

## 2013-07-17 NOTE — Assessment & Plan Note (Signed)
Check A1c and BMET. Encouraged to take CBGs at different times of day and record.

## 2013-07-17 NOTE — Assessment & Plan Note (Signed)
BP stable on cardizem, hctz, losartan Continue same meds/dosages. BP Readings from Last 3 Encounters:  07/17/13 128/80  04/17/13 110/62  04/12/13 108/74

## 2013-07-18 ENCOUNTER — Encounter: Payer: Self-pay | Admitting: Family

## 2013-07-18 LAB — MICROALBUMIN / CREATININE URINE RATIO
Creatinine, Urine: 113.2 mg/dL
Microalb Creat Ratio: 58.2 mg/g — ABNORMAL HIGH (ref 0.0–30.0)
Microalb, Ur: 6.59 mg/dL — ABNORMAL HIGH (ref 0.00–1.89)

## 2013-07-18 LAB — LIPID PANEL
CHOLESTEROL: 156 mg/dL (ref 0–200)
HDL: 42 mg/dL (ref >39)
LDL Cholesterol: 79 mg/dL (ref 0–99)
TRIGLYCERIDES: 173 mg/dL — AB (ref <150)
Total CHOL/HDL Ratio: 3.7 Ratio
VLDL: 35 mg/dL (ref 0–40)

## 2013-07-18 LAB — BASIC METABOLIC PANEL
BUN: 30 mg/dL — AB (ref 6–23)
CHLORIDE: 99 meq/L (ref 96–112)
CO2: 29 meq/L (ref 19–32)
CREATININE: 1.25 mg/dL (ref 0.50–1.35)
Calcium: 10.3 mg/dL (ref 8.4–10.5)
GLUCOSE: 119 mg/dL — AB (ref 70–99)
Potassium: 4.5 mEq/L (ref 3.5–5.3)
Sodium: 139 mEq/L (ref 135–145)

## 2013-07-18 LAB — HEPATIC FUNCTION PANEL
ALBUMIN: 4.5 g/dL (ref 3.5–5.2)
ALK PHOS: 56 U/L (ref 39–117)
ALT: 9 U/L (ref 0–53)
AST: 13 U/L (ref 0–37)
Bilirubin, Direct: 0.2 mg/dL (ref 0.0–0.3)
Indirect Bilirubin: 0.6 mg/dL (ref 0.2–1.2)
TOTAL PROTEIN: 7.7 g/dL (ref 6.0–8.3)
Total Bilirubin: 0.8 mg/dL (ref 0.2–1.2)

## 2013-07-18 LAB — PSA, TOTAL AND FREE
PSA FREE PCT: 38 % (ref >25)
PSA, Free: 0.8 ng/mL
PSA: 2.13 ng/mL (ref <=4.00)

## 2013-07-18 LAB — HEMOGLOBIN A1C
Hgb A1c MFr Bld: 6.4 % — ABNORMAL HIGH (ref <5.7)
Mean Plasma Glucose: 137 mg/dL — ABNORMAL HIGH (ref <117)

## 2013-07-19 ENCOUNTER — Telehealth: Payer: Self-pay | Admitting: Family

## 2013-07-19 LAB — URINE CULTURE

## 2013-07-19 MED ORDER — CIPROFLOXACIN HCL 250 MG PO TABS: 250.0000 mg | ORAL_TABLET | Freq: Two times a day (BID) | ORAL | Status: DC

## 2013-07-19 NOTE — Telephone Encounter (Signed)
Few  Bacteria on culture.  Will rx with cipro x 3 days.

## 2013-07-19 NOTE — Telephone Encounter (Signed)
Notified pt. 

## 2013-07-24 ENCOUNTER — Telehealth: Payer: Self-pay | Admitting: Family

## 2013-07-24 DIAGNOSIS — F419 Anxiety disorder, unspecified: Secondary | ICD-10-CM

## 2013-07-24 MED ORDER — HYDROCODONE-ACETAMINOPHEN 7.5-325 MG PO TABS: 1.0000 | ORAL_TABLET | Freq: Four times a day (QID) | ORAL | Status: DC | PRN

## 2013-07-24 MED ORDER — DIAZEPAM 10 MG PO TABS: 10.0000 mg | ORAL_TABLET | Freq: Every evening | ORAL | Status: DC | PRN

## 2013-07-24 NOTE — Telephone Encounter (Signed)
Last hydrocodone Rx printed 06/22/13, #90. Last valium Rx 06/22/13, #30. Rxs printed and forwarded to PRovider for signature.

## 2013-07-24 NOTE — Telephone Encounter (Signed)
rx's placed at front desk

## 2013-07-24 NOTE — Telephone Encounter (Signed)
Patient is requesting a new rx for his pain and anxiety medication

## 2013-08-10 ENCOUNTER — Other Ambulatory Visit: Payer: Self-pay | Admitting: Family

## 2013-08-14 ENCOUNTER — Other Ambulatory Visit: Payer: Self-pay | Admitting: Family

## 2013-08-15 NOTE — Telephone Encounter (Signed)
Rx sent to pharmacy. LDM 

## 2013-08-23 ENCOUNTER — Other Ambulatory Visit: Payer: Self-pay | Admitting: Family

## 2013-08-23 DIAGNOSIS — F419 Anxiety disorder, unspecified: Secondary | ICD-10-CM

## 2013-08-23 NOTE — Telephone Encounter (Signed)
Patient called in requesting a refill of diazepam and his pain pills

## 2013-08-24 MED ORDER — DIAZEPAM 10 MG PO TABS: 10.0000 mg | ORAL_TABLET | Freq: Every evening | ORAL | Status: DC | PRN

## 2013-08-24 MED ORDER — HYDROCODONE-ACETAMINOPHEN 7.5-325 MG PO TABS: 1.0000 | ORAL_TABLET | Freq: Four times a day (QID) | ORAL | Status: DC | PRN

## 2013-08-24 NOTE — Telephone Encounter (Signed)
REfill granted.  Rx signed and faxed to pharmacy.  One month supply of each medicatio.

## 2013-08-24 NOTE — Telephone Encounter (Signed)
Refill request for diazepam Last filled by MD on- 07/24/13 #30 x0  Refill request for Hydrocodone/ace Last filled by MD on- 07/24/13 #90 x0  Last Appt: 07/17/2013 Next Appt: 11/14/2013 Please advise refills?

## 2013-08-29 ENCOUNTER — Telehealth: Payer: Self-pay | Admitting: Family

## 2013-08-29 MED ORDER — ATORVASTATIN CALCIUM 20 MG PO TABS: 20.0000 mg | ORAL_TABLET | Freq: Every day | ORAL | Status: DC

## 2013-08-29 MED ORDER — PAROXETINE HCL 30 MG PO TABS: ORAL_TABLET | ORAL | Status: DC

## 2013-08-29 NOTE — Telephone Encounter (Signed)
Refills sent

## 2013-08-29 NOTE — Telephone Encounter (Signed)
Patient is requesting a new refill of paroxetine and atorvastatin

## 2013-09-04 ENCOUNTER — Other Ambulatory Visit: Payer: Self-pay | Admitting: Family

## 2013-09-18 ENCOUNTER — Ambulatory Visit: Payer: Medicare Other | Admitting: Family

## 2013-09-18 DIAGNOSIS — Z0289 Encounter for other administrative examinations: Secondary | ICD-10-CM

## 2013-09-25 ENCOUNTER — Other Ambulatory Visit: Payer: Self-pay | Admitting: Family

## 2013-09-25 DIAGNOSIS — F419 Anxiety disorder, unspecified: Secondary | ICD-10-CM

## 2013-09-25 MED ORDER — HYDROCODONE-ACETAMINOPHEN 7.5-325 MG PO TABS: 1.0000 | ORAL_TABLET | Freq: Four times a day (QID) | ORAL | Status: DC | PRN

## 2013-09-25 MED ORDER — DIAZEPAM 10 MG PO TABS: 10.0000 mg | ORAL_TABLET | Freq: Every evening | ORAL | Status: DC | PRN

## 2013-09-25 NOTE — Telephone Encounter (Signed)
Ok to fill Hydrocodone as well, last Rx 08.20.15, #90x0/SLS

## 2013-09-25 NOTE — Telephone Encounter (Signed)
Refill request for Diazepam Last filled by MD on - 08.20.15, #30x0 Last AEX - 07.13.15 Next AEX - 3 Mths Please Advise on refills/SLS

## 2013-09-25 NOTE — Telephone Encounter (Signed)
Patient is also requesting a refill on his pain medication. Please call patient when refill has been sent.

## 2013-09-25 NOTE — Telephone Encounter (Signed)
Ok to send 30 tabs zero refills. 

## 2013-09-25 NOTE — Telephone Encounter (Signed)
Caller name: Arth  Relation to pt: self  Call back number: [phone]   Reason for call: pt requesting rx for  "Diazepam" pt unsure the name of medication.

## 2013-09-25 NOTE — Telephone Encounter (Signed)
See rxs

## 2013-09-26 ENCOUNTER — Telehealth: Payer: Self-pay | Admitting: Family

## 2013-09-26 NOTE — Telephone Encounter (Signed)
Needs written rx for lorazepam and loritab

## 2013-09-26 NOTE — Telephone Encounter (Signed)
DUPLICATE NOTE

## 2013-09-26 NOTE — Telephone Encounter (Signed)
Rx awaiting provider signature/SLS

## 2013-09-26 NOTE — Telephone Encounter (Signed)
Rx  Scripts ready for p/u, given to Christian Morris to call patient and inform/SLS

## 2013-10-10 ENCOUNTER — Encounter: Payer: Self-pay | Admitting: Family

## 2013-10-10 ENCOUNTER — Ambulatory Visit (INDEPENDENT_AMBULATORY_CARE_PROVIDER_SITE_OTHER): Payer: Medicare Other | Admitting: Family

## 2013-10-10 ENCOUNTER — Telehealth: Payer: Self-pay | Admitting: *Deleted

## 2013-10-10 VITALS — BP 108/65 | HR 63 | Temp 97.7°F | Resp 56 | Ht 72.0 in | Wt 284.5 lb

## 2013-10-10 DIAGNOSIS — Z23 Encounter for immunization: Secondary | ICD-10-CM

## 2013-10-10 DIAGNOSIS — S80211A Abrasion, right knee, initial encounter: Secondary | ICD-10-CM

## 2013-10-10 DIAGNOSIS — F419 Anxiety disorder, unspecified: Secondary | ICD-10-CM

## 2013-10-10 NOTE — Assessment & Plan Note (Signed)
Healing well, no sign of infection, continue antibiotic ointment.

## 2013-10-10 NOTE — Telephone Encounter (Signed)
Noted  

## 2013-10-10 NOTE — Progress Notes (Signed)
Pre visit review using our clinic review tool, if applicable. No additional management support is needed unless otherwise documented below in the visit note. 

## 2013-10-10 NOTE — Patient Instructions (Addendum)
Continue to apply antibiotic ointment to your right knee until healed. Call if you develop increased redness of knee or swelling. Let me know if you decide you would like to meet with the therapist for your anxiety.   Follow up in November as scheduled.

## 2013-10-10 NOTE — Telephone Encounter (Signed)
Received call from pt verifying medications that he currently has at home. Pt read medication names / doses to me and all meds coincide with current med list in EPIC.

## 2013-10-10 NOTE — Assessment & Plan Note (Signed)
Already on SSRI and HS Benzo.  Advised pt to make certain that he is taking paxil and recommended that he consider working with a therapist for further treatment of his anxiety. Pt would like to think about this.

## 2013-10-10 NOTE — Progress Notes (Signed)
Subjective:    Patient ID: Christian Morris, male    DOB: 08-Oct-1940, 73 y.o.   MRN: [id number]  HPI Christian Morris presents today with chief compliant of knee pain after a fall.    Laceration on knee- reports falling one week ago.  He jumped off a concrete wall (Less than 3 feet high) and fell on the pavement.  He reports a laceration on his right knee.  He reports that it does not hurt today, and the pain stopped 2 days ago.  Has been using Neosporin on the laceration.  It has healed since the fall.  He is concerned about infection due to his diabetes.    Insomnia - Does not have trouble falling asleep.  He wakes up at 2:00-3:00 in the morning and feels anxious and nervous.  Lays down for an hour and can fall back to sleep after about an hour or so.  Denies daytime sleepiness. Does take a daily nap as needed.  Not certain if he is taking his paxil. Only drinks caffeine in AM.  Reports regular exercise. Does watch TV prior to bed.    Review of Systems  Constitutional: Negative for fever, fatigue and unexpected weight change.  HENT: Negative.   Respiratory: Negative for cough, chest tightness and shortness of breath.   Cardiovascular: Negative for chest pain, palpitations and leg swelling.  Neurological: Negative.    Past Medical History  Diagnosis Date  . Arthritis   . Depression   . Diabetes mellitus   . Hypertension   . Hyperlipidemia   . History of gastric ulcer     Age 62- no recurrence since.  . Transfusion history     due to bleeding ulcer at age 70  . Hypogonadism male 01/11/2011  . Squamous cell carcinoma in situ of skin of forearm 01/23/2011  . Lung nodule     right 16 mm, seen initially 6/12. 35mm in 08/2011.  [name] GERD (gastroesophageal reflux disease)   . Headache(784.0)   . Fatty liver disease, nonalcoholic     History   Social History  . Marital Status: Divorced    Spouse Name: N/A    Number of Children: 3  . Years of Education: N/A   Occupational History  . RETIRED  BUS DRIVER    Social History Main Topics  . Smoking status: Former Smoker -- 1.00 packs/day for 25 years    Types: Cigarettes    Quit date: 01/05/1982  . Smokeless tobacco: Never Used  . Alcohol Use: No  . Drug Use: No  . Sexual Activity: Not on file   Other Topics Concern  . 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    Past Surgical History  Procedure Laterality Date  . Eye surgery  08/2009    cataract removal left eye--Dr Katy Fitch  . Melanoma excision      left forearm    Family History  Problem Relation Age of Onset  . Arthritis Mother   . Heart disease Sister     Massive MI age 22.  . Arrhythmia Brother   . Melanoma Son   . Heart attack Brother     No Known Allergies  Current Outpatient Prescriptions on File Prior to Visit  Medication Sig Dispense Refill  . acetaminophen (TYLENOL) 500 MG tablet Take 1,000 mg by mouth every 8 (eight) hours as needed for mild pain.      [name]  aspirin EC 81 MG tablet Take 81 mg by mouth every morning.       [name] atorvastatin (LIPITOR) 20 MG tablet Take 1 tablet (20 mg total) by mouth daily.  30 tablet  3  . diazepam (VALIUM) 10 MG tablet Take 1 tablet (10 mg total) by mouth at bedtime as needed for anxiety.  30 tablet  0  . diltiazem (CARDIZEM CD) 180 MG 24 hr capsule TAKE ONE CAPSULE BY MOUTH ONCE DAILY  30 capsule  3  . fish oil-omega-3 fatty acids 1000 MG capsule Take 2 g by mouth 2 (two) times daily.        . hydrochlorothiazide (HYDRODIURIL) 25 MG tablet TAKE ONE TABLET BY MOUTH ONCE DAILY  30 tablet  2  . HYDROcodone-acetaminophen (NORCO) 7.5-325 MG per tablet Take 1 tablet by mouth every 6 (six) hours as needed for moderate pain.  90 tablet  0  . insulin NPH Human (HUMULIN N,NOVOLIN N) 100 UNIT/ML injection Inject 0.09 mLs (9 Units total) into the skin 2 (two) times daily before a meal.  10 mL  2  . losartan (COZAAR) 100 MG tablet TAKE ONE TABLET BY MOUTH  ONCE DAILY IN THE MORNING  30 tablet  2  . metFORMIN (GLUCOPHAGE) 500 MG tablet TAKE TWO TABLETS BY MOUTH TWICE DAILY WITH A MEAL  120 tablet  2  . nitroGLYCERIN (NITROSTAT) 0.4 MG SL tablet Place 1 tablet (0.4 mg total) under the tongue every 5 (five) minutes x 3 doses as needed for chest pain (donot take with viagra).  60 tablet  12  . PARoxetine (PAXIL) 30 MG tablet TAKE ONE TABLET BY MOUTH EVERY MORNING  30 tablet  3  . pioglitazone (ACTOS) 30 MG tablet TAKE ONE TABLET BY MOUTH ONCE DAILY  30 tablet  3  . tamsulosin (FLOMAX) 0.4 MG CAPS capsule TAKE ONE CAPSULE BY MOUTH ONCE DAILY  30 capsule  2   No current facility-administered medications on file prior to visit.    BP 108/65  Pulse 63  Temp(Src) 97.7 F (36.5 C) (Oral)  Resp 56  Ht 6' (1.829 m)  Wt 284 lb 8 oz (129.048 kg)  BMI 38.58 kg/m2  SpO2 97%       Objective:   Physical Exam  Constitutional: He is oriented to person, place, and time. He appears well-developed and well-nourished.  HENT:  Head: Normocephalic and atraumatic.  Cardiovascular: Normal rate, regular rhythm, normal heart sounds and intact distal pulses.   Pulmonary/Chest: Effort normal and breath sounds normal. No respiratory distress.  Musculoskeletal: Normal range of motion.  Neurological: He is alert and oriented to person, place, and time.  Skin: Skin is warm and dry.          Assessment & Plan:  Advised to keep using Neosporin as needed, no sign of infection on laceration.  Reccommended limiting screen time before bed, and perhaps try reading to relax before bed.  Advised patient to make sure that he was still correctly taking his Paxil.  Offered for patient to meet with therapist to discuss anxiety, he declined today and will think about this and get back with [place].    I have personally seen and examined patient and agree with Jettie Booze NP student's assessment and plan.

## 2013-10-19 ENCOUNTER — Other Ambulatory Visit: Payer: Self-pay | Admitting: Family

## 2013-10-20 NOTE — Telephone Encounter (Signed)
Rx request to pharmacy/SLS  

## 2013-10-23 ENCOUNTER — Telehealth: Payer: Self-pay | Admitting: Family

## 2013-10-23 DIAGNOSIS — F419 Anxiety disorder, unspecified: Secondary | ICD-10-CM

## 2013-10-23 MED ORDER — DIAZEPAM 10 MG PO TABS: 10.0000 mg | ORAL_TABLET | Freq: Every evening | ORAL | Status: DC | PRN

## 2013-10-23 MED ORDER — HYDROCODONE-ACETAMINOPHEN 7.5-325 MG PO TABS: 1.0000 | ORAL_TABLET | Freq: Four times a day (QID) | ORAL | Status: DC | PRN

## 2013-10-23 NOTE — Telephone Encounter (Signed)
Caller name: Kazuo  Call back number:[phone] Pharmacy:  Reason for call:  Pt wants a refill on Rx  HYDROcodone-acetaminophen (NORCO) 7.5-325 MG per tablet  diazepam (VALIUM) 10 MG tablet

## 2013-10-23 NOTE — Telephone Encounter (Signed)
Rxs sent to front desk and message left on home #.

## 2013-10-23 NOTE — Telephone Encounter (Signed)
Both rxs last printed 09/25/13.  Printed rxs and forwarded to PRovider for signature.

## 2013-11-06 ENCOUNTER — Other Ambulatory Visit: Payer: Self-pay | Admitting: Family

## 2013-11-14 ENCOUNTER — Ambulatory Visit (INDEPENDENT_AMBULATORY_CARE_PROVIDER_SITE_OTHER): Payer: Medicare Other | Admitting: Family

## 2013-11-14 ENCOUNTER — Encounter: Payer: Self-pay | Admitting: Family

## 2013-11-14 VITALS — BP 98/60 | HR 50 | Temp 98.0°F | Resp 18 | Ht 72.0 in | Wt 288.0 lb

## 2013-11-14 DIAGNOSIS — R350 Frequency of micturition: Secondary | ICD-10-CM

## 2013-11-14 DIAGNOSIS — R296 Repeated falls: Secondary | ICD-10-CM

## 2013-11-14 DIAGNOSIS — E119 Type 2 diabetes mellitus without complications: Secondary | ICD-10-CM

## 2013-11-14 DIAGNOSIS — W19XXXA Unspecified fall, initial encounter: Secondary | ICD-10-CM

## 2013-11-14 DIAGNOSIS — N400 Enlarged prostate without lower urinary tract symptoms: Secondary | ICD-10-CM

## 2013-11-14 DIAGNOSIS — R001 Bradycardia, unspecified: Secondary | ICD-10-CM

## 2013-11-14 DIAGNOSIS — I10 Essential (primary) hypertension: Secondary | ICD-10-CM

## 2013-11-14 LAB — POCT URINALYSIS DIPSTICK
BILIRUBIN UA: NEGATIVE
Glucose, UA: NEGATIVE
KETONES UA: NEGATIVE
Leukocytes, UA: NEGATIVE
Nitrite, UA: NEGATIVE
PH UA: 5.5
Protein, UA: 0.15
RBC UA: NEGATIVE
SPEC GRAV UA: 1.025
Urobilinogen, UA: 4

## 2013-11-14 MED ORDER — DILTIAZEM HCL ER COATED BEADS 120 MG PO CP24: 120.0000 mg | ORAL_CAPSULE | Freq: Every day | ORAL | Status: DC

## 2013-11-14 MED ORDER — DOXAZOSIN MESYLATE 1 MG PO TABS: 1.0000 mg | ORAL_TABLET | Freq: Every day | ORAL | Status: DC

## 2013-11-14 NOTE — Assessment & Plan Note (Signed)
Stable on meds.  Continue same.  Follow up in three months.

## 2013-11-14 NOTE — Patient Instructions (Signed)
Stop flomax, start cardura (doxazosin). Changed Cardizem CD dose from 180mg  to 120mg  once daily. You will be contacted about your referral for physical therapy.  Follow up in 1 month.

## 2013-11-14 NOTE — Assessment & Plan Note (Signed)
Advised him to take his blood sugar daily.  Stable on meds.  Follow up in 3 months.

## 2013-11-14 NOTE — Progress Notes (Signed)
Pre visit review using our clinic review tool, if applicable. No additional management support is needed unless otherwise documented below in the visit note. 

## 2013-11-14 NOTE — Progress Notes (Signed)
Subjective:    Patient ID: Christian Morris, male    DOB: February 09, 1940, 72 y.o.   MRN: [id number]  HPI  Christian Morris is a 73 year old male who presents today for follow up. 1. Hypertension- Currently losartan and hctz.  Today's blood pressure is 118/60.    2. Hyperlipidemia- Currently taking Lipitor.  Denies myalgia.   3. Diabetes - Maintained on metformin.  Does not check blood sugars at home regularly.    4. Anxiety- Maintained on paxil. Denies feeling anxious.   5. Recent falls- First fall was 2-3 months.  He feels like his feet are moving faster than his body.  Most often happens after he has been sitting for awhile.  Last week he fell two times.  He was laying down at home, got up and fell down the ramp at his house.  The second fall was at [facility] when he was walking in.  Denies injury or hitting his head with either fall.  Does not feel dizzy or light headed when before he falls.    6. Flank Pain - Reports that he feels a sharp pain starting on the left side of his chest and radiates down his torso to his back.  Pain started over a month ago.  Pain is intermittent and is not related to activity.  He has not taken anything for it.    7. Urinary frequency - Reports that he gets up every 2 hours at night to urinate.  Sometimes there is burning associated with urination.  Denies decreased urine and urine in light yellow in color. He does not have the frequency during the day.    Orthostatic vitals are 102/60 lying down, 98/60 sitting, 90/60 standing.      Review of Systems  Constitutional: Negative for fever and fatigue.  HENT: Positive for congestion. Negative for rhinorrhea and sore throat.   Respiratory: Negative for cough, shortness of breath and wheezing.   Cardiovascular: Negative for chest pain, palpitations and leg swelling.  Genitourinary: Positive for urgency and frequency. Negative for hematuria.  Skin: Negative for color change, pallor, rash and wound.  Neurological: Negative  for dizziness, syncope and light-headedness.   Past Medical History  Diagnosis Date  . Arthritis   . Depression   . Diabetes mellitus   . Hypertension   . Hyperlipidemia   . History of gastric ulcer     Age 50- no recurrence since.  . Transfusion history     due to bleeding ulcer at age 72  . Hypogonadism male 01/11/2011  . Squamous cell carcinoma in situ of skin of forearm 01/23/2011  . Lung nodule     right 16 mm, seen initially 6/12. 35mm in 08/2011.  [name] GERD (gastroesophageal reflux disease)   . Headache(784.0)   . Fatty liver disease, nonalcoholic     History   Social History  . Marital Status: Divorced    Spouse Name: N/A    Number of Children: 3  . Years of Education: N/A   Occupational History  . RETIRED BUS DRIVER    Social History Main Topics  . Smoking status: Former Smoker -- 1.00 packs/day for 25 years    Types: Cigarettes    Quit date: 01/05/1982  . Smokeless tobacco: Never Used  . Alcohol Use: No  . Drug Use: No  . Sexual Activity: Not on file   Other Topics Concern  . 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    Past Surgical History  Procedure Laterality Date  . Eye surgery  08/2009    cataract removal left eye--Dr Katy Fitch  . Melanoma excision      left forearm    Family History  Problem Relation Age of Onset  . Arthritis Mother   . Heart disease Sister     Massive MI age 38.  . Arrhythmia Brother   . Melanoma Son   . Heart attack Brother     No Known Allergies  Current Outpatient Prescriptions on File Prior to Visit  Medication Sig Dispense Refill  . acetaminophen (TYLENOL) 500 MG tablet Take 1,000 mg by mouth every 8 (eight) hours as needed for mild pain.    [name] aspirin EC 81 MG tablet Take 81 mg by mouth every morning.     [name] atorvastatin (LIPITOR) 20 MG tablet Take 1 tablet (20 mg total) by mouth daily. 30 tablet 3  . diazepam (VALIUM) 10 MG tablet Take  1 tablet (10 mg total) by mouth at bedtime as needed for anxiety. 30 tablet 0  . fish oil-omega-3 fatty acids 1000 MG capsule Take 2 g by mouth 2 (two) times daily.      . hydrochlorothiazide (HYDRODIURIL) 25 MG tablet TAKE ONE TABLET BY MOUTH ONCE DAILY 30 tablet 2  . HYDROcodone-acetaminophen (NORCO) 7.5-325 MG per tablet Take 1 tablet by mouth every 6 (six) hours as needed for moderate pain. 90 tablet 0  . losartan (COZAAR) 100 MG tablet TAKE ONE TABLET BY MOUTH ONCE DAILY IN THE MORNING 30 tablet 2  . metFORMIN (GLUCOPHAGE) 500 MG tablet TAKE TWO TABLETS BY MOUTH TWICE DAILY WITH MEALS 120 tablet 2  . nitroGLYCERIN (NITROSTAT) 0.4 MG SL tablet Place 1 tablet (0.4 mg total) under the tongue every 5 (five) minutes x 3 doses as needed for chest pain (donot take with viagra). 60 tablet 12  . PARoxetine (PAXIL) 30 MG tablet TAKE ONE TABLET BY MOUTH EVERY MORNING 30 tablet 3  . pioglitazone (ACTOS) 30 MG tablet TAKE ONE TABLET BY MOUTH ONCE DAILY 30 tablet 3   No current facility-administered medications on file prior to visit.    BP 98/60 mmHg  Pulse 50  Temp(Src) 98 F (36.7 C) (Oral)  Resp 18  Ht 6' (1.829 m)  Wt 288 lb (130.636 kg)  BMI 39.05 kg/m2  SpO2 97%       Objective:   Physical Exam  Constitutional: He is oriented to person, place, and time. He appears well-developed and well-nourished.  HENT:  Head: Normocephalic and atraumatic.  Neck: Normal range of motion. Neck supple. No JVD present.  Cardiovascular: Normal rate, regular rhythm and normal heart sounds.   Pulmonary/Chest: Effort normal and breath sounds normal. He has no wheezes.  Musculoskeletal: Normal range of motion.  Neurological: He is alert and oriented to person, place, and time.  Skin: Skin is warm and dry.          Assessment & Plan:  Change flomax to doxazosin.  PT consult.  Reduce cardizem to 120 mg daily.   Follow up in one month.    I have personally seen and examined patient and agree with  Jettie Booze NP student's assessment and plan- [name] NP

## 2013-11-14 NOTE — Assessment & Plan Note (Addendum)
Stable on meds.  Continue same.  Follow up in 1 month

## 2013-11-14 NOTE — Assessment & Plan Note (Signed)
Changed flomax to doxazosin.

## 2013-11-14 NOTE — Assessment & Plan Note (Addendum)
Changed flomax to doxazosin to reduce orthostatic hypotension.  PT consult. Reduce cardizem from 180mg  qd to 120mg  qd for heart rate of 50.  Follow up in 1 month.

## 2013-11-21 ENCOUNTER — Telehealth: Payer: Self-pay | Admitting: Family

## 2013-11-21 DIAGNOSIS — F419 Anxiety disorder, unspecified: Secondary | ICD-10-CM

## 2013-11-21 MED ORDER — DIAZEPAM 10 MG PO TABS: 10.0000 mg | ORAL_TABLET | Freq: Every evening | ORAL | Status: DC | PRN

## 2013-11-21 MED ORDER — HYDROCODONE-ACETAMINOPHEN 7.5-325 MG PO TABS: 1.0000 | ORAL_TABLET | Freq: Four times a day (QID) | ORAL | Status: DC | PRN

## 2013-11-21 NOTE — Telephone Encounter (Signed)
Rxs placed at the front desk for pick up and pt notified.

## 2013-11-21 NOTE — Telephone Encounter (Signed)
Rxs last printed on 10/23/13.  Rxs printed and forwarded to Provider for signature.

## 2013-11-21 NOTE — Telephone Encounter (Signed)
Caller name: Reco Relation to pt: self Call back number: [phone] Pharmacy:  Reason for call:   Patient is requesting a new rx for diazepam and his pain medication

## 2013-12-11 ENCOUNTER — Other Ambulatory Visit: Payer: Self-pay

## 2013-12-11 MED ORDER — HYDROCHLOROTHIAZIDE 25 MG PO TABS: 25.0000 mg | ORAL_TABLET | Freq: Every day | ORAL | Status: DC

## 2013-12-12 ENCOUNTER — Other Ambulatory Visit: Payer: Self-pay | Admitting: Family

## 2013-12-12 NOTE — Telephone Encounter (Signed)
Caller name:Eckerman, Hanzel Relation to EP:PIRJ Call back number:[phone] Pharmacy:[facility]  Reason for call: pt would like for you to call him, states [facility] informed him that they have sent several request for his meds, however pt does not know what the meds are.

## 2013-12-14 ENCOUNTER — Encounter (HOSPITAL_COMMUNITY): Payer: Self-pay | Admitting: Cardiovascular Disease

## 2013-12-14 NOTE — Telephone Encounter (Signed)
Spoke with [facility] who states that patient picked up HCTZ already. Other request was for flomax which was discontinued at last ov. Scheduled patient for follow up as noted on last visit.

## 2013-12-15 ENCOUNTER — Telehealth: Payer: Self-pay | Admitting: *Deleted

## 2013-12-15 NOTE — Telephone Encounter (Signed)
Received refill requests from [facility] for hctz and tamsulosin.  Faxed denial to pharmacy for hctz as rx was refilled on 12/11/13, #30 x no refills. Tamsulosin no longer on current med list. Is pt supposed to be taking it?

## 2013-12-18 ENCOUNTER — Other Ambulatory Visit: Payer: Self-pay | Admitting: Family

## 2013-12-18 NOTE — Telephone Encounter (Signed)
No flomax was changed to doxazosin.

## 2013-12-18 NOTE — Telephone Encounter (Signed)
Left detailed message on pharmacy voicemail and to call if any questions. 

## 2013-12-18 NOTE — Telephone Encounter (Signed)
Medication Detail      Disp Refills Start End     losartan (COZAAR) 100 MG tablet 30 tablet 2 09/05/2013     Sig: TAKE ONE TABLET BY MOUTH ONCE DAILY IN THE MORNING    E-Prescribing Status: Receipt confirmed by pharmacy (09/05/2013 12:19 PM EDT)

## 2013-12-21 ENCOUNTER — Telehealth: Payer: Self-pay | Admitting: Family

## 2013-12-21 DIAGNOSIS — F419 Anxiety disorder, unspecified: Secondary | ICD-10-CM

## 2013-12-21 NOTE — Telephone Encounter (Signed)
Caller name: Darell Relation to pt: self Call back number: [phone] Pharmacy:  Reason for call:   Requesting a new rx for diazepam and pain pills.

## 2013-12-21 NOTE — Telephone Encounter (Signed)
Ok to send refills as below.  

## 2013-12-21 NOTE — Telephone Encounter (Signed)
Request Norco 7.5/325  No UDS Contract on file Last OV 11/14/2013 Last fill 11/17 #90 no refills  Diazepam 10mg  Last fill 11/17 #30 no refills Has appt with you 12/23

## 2013-12-22 MED ORDER — HYDROCODONE-ACETAMINOPHEN 7.5-325 MG PO TABS: 1.0000 | ORAL_TABLET | Freq: Four times a day (QID) | ORAL | Status: DC | PRN

## 2013-12-22 MED ORDER — DIAZEPAM 10 MG PO TABS: 10.0000 mg | ORAL_TABLET | Freq: Every evening | ORAL | Status: DC | PRN

## 2013-12-22 NOTE — Telephone Encounter (Signed)
Per verbal from Provider, pt will need to leave [place] a urine sample for drug screening protocol. Rxs placed at front desk for pick up and pt has been notified re: drug screen.

## 2013-12-22 NOTE — Telephone Encounter (Signed)
Rxs printed and forwarded to PRovider for signature.

## 2013-12-27 ENCOUNTER — Encounter: Payer: Self-pay | Admitting: Family

## 2013-12-27 ENCOUNTER — Ambulatory Visit (INDEPENDENT_AMBULATORY_CARE_PROVIDER_SITE_OTHER): Payer: Medicare Other | Admitting: Family

## 2013-12-27 VITALS — BP 110/66 | HR 55 | Temp 97.6°F | Resp 16 | Ht 72.0 in | Wt 285.0 lb

## 2013-12-27 DIAGNOSIS — E119 Type 2 diabetes mellitus without complications: Secondary | ICD-10-CM

## 2013-12-27 DIAGNOSIS — R350 Frequency of micturition: Secondary | ICD-10-CM

## 2013-12-27 DIAGNOSIS — I10 Essential (primary) hypertension: Secondary | ICD-10-CM

## 2013-12-27 DIAGNOSIS — N400 Enlarged prostate without lower urinary tract symptoms: Secondary | ICD-10-CM

## 2013-12-27 MED ORDER — DOXAZOSIN MESYLATE 2 MG PO TABS: 2.0000 mg | ORAL_TABLET | Freq: Every day | ORAL | Status: DC

## 2013-12-27 NOTE — Progress Notes (Signed)
Pre visit review using our clinic review tool, if applicable. No additional management support is needed unless otherwise documented below in the visit note. 

## 2013-12-27 NOTE — Addendum Note (Signed)
Addended by: Peggyann Shoals on: 12/27/2013 03:57 PM   Modules accepted: Orders

## 2013-12-27 NOTE — Assessment & Plan Note (Signed)
BP stable on current meds. Continue same.  

## 2013-12-27 NOTE — Progress Notes (Signed)
Subjective:    Patient ID: Christian Morris, male    DOB: 05/23/1940, 73 y.o.   MRN: [id number]  HPI  Christian Morris is a 73 yr old male who presents today for follow up.   1) HTN- Patient is currently maintained on the following medications for blood pressure: cardizem, hctz, losartan Patient reports good compliance with blood pressure medications. Patient denies chest pain, shortness of breath or swelling. Last 3 blood pressure readings in our office are as follows: BP Readings from Last 3 Encounters:  12/27/13 110/66  11/14/13 98/60  10/10/13 108/65   2) Urinary urgency- reports that it feels difficulty to start his urinary stream.  Then he does feel like he empties his bladder.  He urinates about 2x a night and 3 times during the day.    He has been going to the [facility] and walking and doing water exercise.  Wt Readings from Last 3 Encounters:  12/27/13 285 lb (129.275 kg)  11/14/13 288 lb (130.636 kg)  10/10/13 284 lb 8 oz (129.048 kg)   3) DM2- Pt is currently maintained on the following medications for diabetes:actos, metformin Last A1C was:   Lab Results  Component Value Date   HGBA1C 6.4* 07/17/2013  Last diabetic eye exam was 5/15 Denies polyuria/polydipsia. Denies hypoglycemia Home glucose readings range: 100-130's    Review of Systems See HPI  Past Medical History  Diagnosis Date  . Arthritis   . Depression   . Diabetes mellitus   . Hypertension   . Hyperlipidemia   . History of gastric ulcer     Age 51- no recurrence since.  . Transfusion history     due to bleeding ulcer at age 20  . Hypogonadism male 01/11/2011  . Squamous cell carcinoma in situ of skin of forearm 01/23/2011  . Lung nodule     right 16 mm, seen initially 6/12. 75mm in 08/2011.  [name] GERD (gastroesophageal reflux disease)   . Headache(784.0)   . Fatty liver disease, nonalcoholic     History   Social History  . Marital Status: Divorced    Spouse Name: N/A    Number of Children: 3  .  Years of Education: N/A   Occupational History  . RETIRED BUS DRIVER    Social History Main Topics  . Smoking status: Former Smoker -- 1.00 packs/day for 25 years    Types: Cigarettes    Quit date: 01/05/1982  . Smokeless tobacco: Never Used  . Alcohol Use: No  . Drug Use: No  . Sexual Activity: Not on file   Other Topics Concern  . 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    Past Surgical History  Procedure Laterality Date  . Eye surgery  08/2009    cataract removal left eye--Dr Katy Fitch  . Melanoma excision      left forearm  . Left heart catheterization with coronary angiogram N/A 11/17/2012    Procedure: LEFT HEART CATHETERIZATION WITH CORONARY ANGIOGRAM;  Surgeon: Burnell Blanks, MD;  [facility] CATH LAB;  Service: Cardiovascular;  Laterality: N/A;    Family History  Problem Relation Age of Onset  . Arthritis Mother   . Heart disease Sister     Massive MI age 56.  . Arrhythmia Brother   . Melanoma Son   . Heart attack Brother     No Known Allergies  Current Outpatient  Prescriptions on File Prior to Visit  Medication Sig Dispense Refill  . acetaminophen (TYLENOL) 500 MG tablet Take 1,000 mg by mouth every 8 (eight) hours as needed for mild pain.    [name] aspirin EC 81 MG tablet Take 81 mg by mouth every morning.     [name] atorvastatin (LIPITOR) 20 MG tablet Take 1 tablet (20 mg total) by mouth daily. 30 tablet 3  . diazepam (VALIUM) 10 MG tablet Take 1 tablet (10 mg total) by mouth at bedtime as needed for anxiety. 30 tablet 0  . diltiazem (CARDIZEM CD) 120 MG 24 hr capsule Take 1 capsule (120 mg total) by mouth daily. 30 capsule 2  . doxazosin (CARDURA) 1 MG tablet Take 1 tablet (1 mg total) by mouth daily. 30 tablet 2  . fish oil-omega-3 fatty acids 1000 MG capsule Take 2 g by mouth 2 (two) times daily.      . hydrochlorothiazide (HYDRODIURIL) 25 MG tablet Take 1 tablet (25 mg  total) by mouth daily. 30 tablet 0  . HYDROcodone-acetaminophen (NORCO) 7.5-325 MG per tablet Take 1 tablet by mouth every 6 (six) hours as needed for moderate pain. 90 tablet 0  . LANTUS SOLOSTAR 100 UNIT/ML Solostar Pen Inject 16 Units into the skin daily.  2  . losartan (COZAAR) 100 MG tablet TAKE ONE TABLET BY MOUTH ONCE DAILY IN THE MORNING 30 tablet 0  . metFORMIN (GLUCOPHAGE) 500 MG tablet TAKE TWO TABLETS BY MOUTH TWICE DAILY WITH MEALS 120 tablet 2  . nitroGLYCERIN (NITROSTAT) 0.4 MG SL tablet Place 1 tablet (0.4 mg total) under the tongue every 5 (five) minutes x 3 doses as needed for chest pain (donot take with viagra). 60 tablet 12  . PARoxetine (PAXIL) 30 MG tablet TAKE ONE TABLET BY MOUTH EVERY MORNING 30 tablet 3  . pioglitazone (ACTOS) 30 MG tablet TAKE ONE TABLET BY MOUTH ONCE DAILY 30 tablet 3   No current facility-administered medications on file prior to visit.    BP 110/66 mmHg  Pulse 55  Temp(Src) 97.6 F (36.4 C) (Oral)  Resp 16  Ht 6' (1.829 m)  Wt 285 lb (129.275 kg)  BMI 38.64 kg/m2  SpO2 96%       Objective:   Physical Exam  Constitutional: He is oriented to person, place, and time. He appears well-developed and well-nourished. No distress.  HENT:  Head: Normocephalic and atraumatic.  Cardiovascular: Normal rate and regular rhythm.   No murmur heard. Pulmonary/Chest: Effort normal and breath sounds normal. No respiratory distress. He has no wheezes. He has no rales. He exhibits no tenderness.  Musculoskeletal: He exhibits no edema.  Neurological: He is alert and oriented to person, place, and time.  Psychiatric: He has a normal mood and affect. His behavior is normal. Judgment and thought content normal.          Assessment & Plan:

## 2013-12-27 NOTE — Assessment & Plan Note (Signed)
Given urgency will obtain urine culture to rule out infection.  Increase doxazosin from 1mg  to 2mg .

## 2013-12-27 NOTE — Assessment & Plan Note (Addendum)
Clinically stable, obtain A1C. Continue actos/metformin.

## 2013-12-27 NOTE — Patient Instructions (Addendum)
Increase doxazosin to 2mg  once daily for your urinary symptoms. Go to the ER if you are unable to urinate. Complete lab work prior to leaving. Follow up in 3 months.

## 2013-12-28 LAB — BASIC METABOLIC PANEL
BUN: 36 mg/dL — AB (ref 6–23)
CALCIUM: 9.9 mg/dL (ref 8.4–10.5)
CO2: 25 meq/L (ref 19–32)
CREATININE: 1.5 mg/dL (ref 0.4–1.5)
Chloride: 100 mEq/L (ref 96–112)
GFR: 47.72 mL/min — ABNORMAL LOW (ref >60.00)
GLUCOSE: 144 mg/dL — AB (ref 70–99)
Potassium: 4.5 mEq/L (ref 3.5–5.1)
Sodium: 137 mEq/L (ref 135–145)

## 2013-12-28 LAB — HEMOGLOBIN A1C: Hgb A1c MFr Bld: 6.9 % — ABNORMAL HIGH (ref 4.6–6.5)

## 2013-12-29 LAB — URINE CULTURE: Colony Count: 50000

## 2013-12-30 ENCOUNTER — Other Ambulatory Visit: Payer: Self-pay | Admitting: Family

## 2013-12-30 NOTE — Telephone Encounter (Signed)
Urine culture grew some bacteria. Recommend cipro bid x 3 days. rx pended below.

## 2014-01-01 ENCOUNTER — Other Ambulatory Visit: Payer: Self-pay

## 2014-01-01 ENCOUNTER — Other Ambulatory Visit: Payer: Self-pay | Admitting: Family

## 2014-01-01 MED ORDER — PAROXETINE HCL 30 MG PO TABS: ORAL_TABLET | ORAL | Status: DC

## 2014-01-01 MED ORDER — CIPROFLOXACIN HCL 250 MG PO TABS: 250.0000 mg | ORAL_TABLET | Freq: Two times a day (BID) | ORAL | Status: DC

## 2014-01-01 NOTE — Telephone Encounter (Signed)
Paxil sent to [facility].

## 2014-01-01 NOTE — Telephone Encounter (Signed)
Pt notified and made aware.  Pt agrees with plan and plans to follow.  Rx sent to [facility].

## 2014-01-01 NOTE — Telephone Encounter (Signed)
Caller name:Adduci, Kalonji Relation to PJ:KDTO Call back [phone] Pharmacy:[facility]  Reason for call: pt is needing rx for PARoxetine (PAXIL) 30 MG tablet

## 2014-01-01 NOTE — Telephone Encounter (Signed)
Caller name: Khan Relation to pt: self Call back number: [phone] [facility] [address]  Reason for call:   Needs losartan refill

## 2014-01-04 MED ORDER — LOSARTAN POTASSIUM 100 MG PO TABS
ORAL_TABLET | ORAL | Status: DC
Start: 2014-01-04 — End: 2014-02-23

## 2014-01-04 NOTE — Telephone Encounter (Signed)
Losartan refilled per protocol. JG//CMA 

## 2014-01-11 ENCOUNTER — Telehealth: Payer: Self-pay | Admitting: Family

## 2014-01-11 NOTE — Telephone Encounter (Signed)
BPH (benign prostatic hyperplasia) - [name], NP at 12/27/2013 3:08 PM     Status: Written Related Problem: BPH (benign prostatic hyperplasia)   Expand All Collapse All   Given urgency will obtain urine culture to rule out infection. Increase doxazosin from 1mg  to 2mg .        Pt made aware that Melissa increased doxazosin from 1 mg to 2 mg on 12/27/13.  Pt stated understanding.  No further questions or concerns were voiced.

## 2014-01-11 NOTE — Telephone Encounter (Signed)
Caller name: Khalifa, Knecht Relation to pt: self  Call back number: [phone]   Reason for call:  Pt states doxazosin (CARDURA) 2 MG tablet should be 1MG . Please advise

## 2014-01-16 ENCOUNTER — Telehealth: Payer: Self-pay | Admitting: *Deleted

## 2014-01-16 MED ORDER — HYDROCHLOROTHIAZIDE 25 MG PO TABS: 25.0000 mg | ORAL_TABLET | Freq: Every day | ORAL | Status: DC

## 2014-01-16 NOTE — Telephone Encounter (Signed)
Received fax requesting refill of HCTZ.  Pt due for follow up in March.  REfill sent.

## 2014-01-19 ENCOUNTER — Telehealth: Payer: Self-pay | Admitting: Family

## 2014-01-19 DIAGNOSIS — F419 Anxiety disorder, unspecified: Secondary | ICD-10-CM

## 2014-01-19 MED ORDER — DIAZEPAM 10 MG PO TABS: 10.0000 mg | ORAL_TABLET | Freq: Every evening | ORAL | Status: DC | PRN

## 2014-01-19 MED ORDER — HYDROCODONE-ACETAMINOPHEN 7.5-325 MG PO TABS: 1.0000 | ORAL_TABLET | Freq: Four times a day (QID) | ORAL | Status: DC | PRN

## 2014-01-19 NOTE — Telephone Encounter (Signed)
Caller name: Jerrit, Horen Relationship to patient: self  Can be reached: [phone]   Reason for call: Pt requesting a refill HYDROcodone-acetaminophen (NORCO) 7.5-325 MG per tablet

## 2014-01-19 NOTE — Telephone Encounter (Signed)
Pt will be due for valium and hydrocodone on 01/22/14. Pt will be due for follow up in March.  Rxs printed and forwarded to Provider for signature.

## 2014-01-19 NOTE — Telephone Encounter (Signed)
Pt states he is completely out and does not have any meds left. Please advise

## 2014-01-19 NOTE — Telephone Encounter (Signed)
Rxs placed at front desk for pick up and pt has been notified.

## 2014-01-19 NOTE — Telephone Encounter (Signed)
rx signed

## 2014-02-08 ENCOUNTER — Telehealth: Payer: Self-pay | Admitting: Family

## 2014-02-08 ENCOUNTER — Other Ambulatory Visit: Payer: Self-pay | Admitting: Family

## 2014-02-08 NOTE — Telephone Encounter (Signed)
Pt has an appointment scheduled for 02/09/14 @ 11:00 am with Dr. Etter Sjogren.

## 2014-02-08 NOTE — Telephone Encounter (Signed)
Patient Name: Christian Morris  DOB: 1940/11/16    Initial Comment Caller states, chest pains , also has a bad headache    Nurse Assessment  Nurse: Mallie Mussel, RN, Alveta Heimlich Date/Time (Eastern Time): 02/08/2014 8:57:09 AM  Confirm and document reason for call. If symptomatic, describe symptoms. ---Caller states that he has chest pain and a bad headache. He states that his chest pain comes and goes and it is hard to describe. The pain does not last longer than 5 minutes. Denies chest pain at present. The pain is not more frequent or increase in intensity. He states that he has tried to see his doctor for 2 weeks. He rates his pain as 6-7 on 0-10 scale. His head is all stopped up. He has bilateral ear pain with this also. Denies more difficulty in breathing.  Has the patient traveled out of the country within the last 30 days? ---No  Does the patient require triage? ---Yes  Related visit to physician within the last 2 weeks? ---No  Does the PT have any chronic conditions? (i.e. diabetes, asthma, etc.) ---Yes  List chronic conditions. ---Diabetes     Guidelines    Guideline Title Affirmed Question Affirmed Notes  Sinus Pain or Congestion Earache    Final Disposition User   See Physician within [facility], Therapist, sports, Alveta Heimlich

## 2014-02-09 ENCOUNTER — Ambulatory Visit (INDEPENDENT_AMBULATORY_CARE_PROVIDER_SITE_OTHER): Payer: PPO | Admitting: Family Medicine

## 2014-02-09 ENCOUNTER — Telehealth: Payer: Self-pay | Admitting: Family

## 2014-02-09 ENCOUNTER — Encounter: Payer: Self-pay | Admitting: Family Medicine

## 2014-02-09 VITALS — BP 128/68 | HR 59 | Temp 98.2°F | Wt 281.4 lb

## 2014-02-09 DIAGNOSIS — J0110 Acute frontal sinusitis, unspecified: Secondary | ICD-10-CM

## 2014-02-09 MED ORDER — FLUTICASONE PROPIONATE 50 MCG/ACT NA SUSP: 2.0000 | Freq: Every day | NASAL | Status: DC

## 2014-02-09 MED ORDER — DILTIAZEM HCL ER COATED BEADS 180 MG PO CP24
180.0000 mg | ORAL_CAPSULE | Freq: Every day | ORAL | Status: DC
Start: 2014-02-09 — End: 2014-02-23

## 2014-02-09 MED ORDER — AMOXICILLIN-POT CLAVULANATE 875-125 MG PO TABS
1.0000 | ORAL_TABLET | Freq: Two times a day (BID) | ORAL | Status: DC
Start: 2014-02-09 — End: 2014-02-21

## 2014-02-09 NOTE — Telephone Encounter (Signed)
Caller name: Kyaire Relation to pt: Call back number: [phone] Pharmacy:  [facility] [address] [place], [place] - [address]     Reason for call: Pt came in office requesting refill on Cartia XT 180/24 HR (Diltiazem ER (CD) 180 mg / 24 hr. Please advise.

## 2014-02-09 NOTE — Telephone Encounter (Signed)
If he has been taking 180- ok to continue 180 and send refill.

## 2014-02-09 NOTE — Progress Notes (Signed)
Pre visit review using our clinic review tool, if applicable. No additional management support is needed unless otherwise documented below in the visit note. 

## 2014-02-09 NOTE — Patient Instructions (Signed)

## 2014-02-09 NOTE — Progress Notes (Signed)
Subjective:     Christian Morris is a 74 y.o. male who presents for evaluation of symptoms of a URI. Symptoms include congestion, facial pain, nasal congestion, no  fever and sinus pressure. Onset of symptoms was 2 months ago, and has been gradually worsening since that time. Treatment to date: none.  The following portions of the patient's history were reviewed and updated as appropriate:  He  has a past medical history of Arthritis; Depression; Diabetes mellitus; Hypertension; Hyperlipidemia; History of gastric ulcer; Transfusion history; Hypogonadism male (01/11/2011); Squamous cell carcinoma in situ of skin of forearm (01/23/2011); Lung nodule; GERD (gastroesophageal reflux disease); Headache(784.0); and Fatty liver disease, nonalcoholic. He  does not have any pertinent problems on file. He  has past surgical history that includes Eye surgery (08/2009); Melanoma excision; and left heart catheterization with coronary angiogram (N/A, 11/17/2012). His family history includes Arrhythmia in his brother; Arthritis in his mother; Heart attack in his brother; Heart disease in his sister; Melanoma in his son. He  reports that he quit smoking about 32 years ago. His smoking use included Cigarettes. He has a 25 pack-year smoking history. He has never used smokeless tobacco. He reports that he does not drink alcohol or use illicit drugs. He has a current medication list which includes the following prescription(s): aspirin ec, atorvastatin, diazepam, diltiazem, doxazosin, fish oil-omega-3 fatty acids, hydrochlorothiazide, hydrocodone-acetaminophen, lantus solostar, losartan, metformin, nitroglycerin, paroxetine, and pioglitazone. Current Outpatient Prescriptions on File Prior to Visit  Medication Sig Dispense Refill  . aspirin EC 81 MG tablet Take 81 mg by mouth every morning.     [name] atorvastatin (LIPITOR) 20 MG tablet Take 1 tablet (20 mg total) by mouth daily. 30 tablet 3  . diazepam (VALIUM) 10 MG tablet Take 1  tablet (10 mg total) by mouth at bedtime as needed for anxiety. 30 tablet 0  . diltiazem (CARDIZEM CD) 120 MG 24 hr capsule Take 1 capsule (120 mg total) by mouth daily. 30 capsule 2  . doxazosin (CARDURA) 2 MG tablet Take 1 tablet (2 mg total) by mouth daily. 30 tablet 2  . fish oil-omega-3 fatty acids 1000 MG capsule Take 2 g by mouth 2 (two) times daily.      . hydrochlorothiazide (HYDRODIURIL) 25 MG tablet Take 1 tablet (25 mg total) by mouth daily. 30 tablet 2  . HYDROcodone-acetaminophen (NORCO) 7.5-325 MG per tablet Take 1 tablet by mouth every 6 (six) hours as needed for moderate pain. 90 tablet 0  . LANTUS SOLOSTAR 100 UNIT/ML Solostar Pen Inject 16 Units into the skin daily.  2  . losartan (COZAAR) 100 MG tablet TAKE ONE TABLET BY MOUTH ONCE DAILY IN THE MORNING 30 tablet 5  . metFORMIN (GLUCOPHAGE) 500 MG tablet TAKE TWO TABLETS BY MOUTH TWICE DAILY WITH MEALS 120 tablet 2  . nitroGLYCERIN (NITROSTAT) 0.4 MG SL tablet Place 1 tablet (0.4 mg total) under the tongue every 5 (five) minutes x 3 doses as needed for chest pain (donot take with viagra). 60 tablet 12  . PARoxetine (PAXIL) 30 MG tablet TAKE ONE TABLET BY MOUTH EVERY MORNING 30 tablet 1  . pioglitazone (ACTOS) 30 MG tablet TAKE ONE TABLET BY MOUTH ONCE DAILY 30 tablet 3   No current facility-administered medications on file prior to visit.   He has No Known Allergies..  Review of Systems Pertinent items are noted in HPI.   Objective:    BP 128/68 mmHg  Pulse 59  Temp(Src) 98.2 F (36.8 C) (Oral)  Wt  281 lb 6.4 oz (127.642 kg)  SpO2 96% General appearance: alert, cooperative, appears stated age and no distress Ears: normal TM's and external ear canals both ears Nose: clear discharge, moderate congestion, turbinates red, swollen, sinus tenderness bilateral Throat: lips, mucosa, and tongue normal; teeth and gums normal Neck: moderate anterior cervical adenopathy, supple, symmetrical, trachea midline and thyroid not  enlarged, symmetric, no tenderness/mass/nodules Lungs: clear to auscultation bilaterally Heart: S1, S2 normal Extremities: extremities normal, atraumatic, no cyanosis or edema   Assessment:    sinusitis   Plan:    Discussed diagnosis and treatment of URI. Augmentin per orders. Nasal steroids per orders. Follow up as needed. flonase and antihistamine prn

## 2014-02-09 NOTE — Telephone Encounter (Signed)
Pt being seen in the office today. Received verbal from [facility] that pt has been taking diltiazem 180mg  but dose was changed in November to 120mg  daily. Pt is needing refill. Please advise if ok to continue on 180 or have pt reduce to 120 now?

## 2014-02-09 NOTE — Telephone Encounter (Signed)
Pt wants to know also if he needs to schedule an appt. In the future. Please advise.

## 2014-02-09 NOTE — Telephone Encounter (Signed)
Spoke with pt and he states he has been taking 180mg ; refill sent. Pt scheduled routine f/u on 04/09/14 at 11am.

## 2014-02-16 ENCOUNTER — Other Ambulatory Visit: Payer: Self-pay | Admitting: Family

## 2014-02-19 ENCOUNTER — Telehealth: Payer: Self-pay | Admitting: Family

## 2014-02-19 NOTE — Telephone Encounter (Signed)
Received call from pt stating he thinks his metformin dose needs to be adjusted. Wakes up every night between 2-3am shaking all over. Episode usually lasts 30 min and resolves on its own. Pt has not checked glucose during these episodes but states FBS the next morning has been 146.  Scheduled pt appt for 02/21/14 at 11am.  Please advise if further instructions?

## 2014-02-19 NOTE — Telephone Encounter (Signed)
Per verbal from Provider, pt should decrease Lantus to 12 units at bedtime, keep appt on 02/21/14 and check BS during "shaking" episodes. Attempted to reach pt and left message for pt to return my call.

## 2014-02-20 NOTE — Telephone Encounter (Signed)
Notified pt and he voices understanding. 

## 2014-02-21 ENCOUNTER — Emergency Department (HOSPITAL_BASED_OUTPATIENT_CLINIC_OR_DEPARTMENT_OTHER): Payer: PPO

## 2014-02-21 ENCOUNTER — Ambulatory Visit (INDEPENDENT_AMBULATORY_CARE_PROVIDER_SITE_OTHER): Payer: PPO | Admitting: Family

## 2014-02-21 ENCOUNTER — Encounter (HOSPITAL_BASED_OUTPATIENT_CLINIC_OR_DEPARTMENT_OTHER): Payer: Self-pay

## 2014-02-21 ENCOUNTER — Observation Stay (HOSPITAL_BASED_OUTPATIENT_CLINIC_OR_DEPARTMENT_OTHER)
Admission: EM | Admit: 2014-02-21 | Discharge: 2014-02-23 | Disposition: A | Discharge status: Home / Self Care | Payer: PPO | Attending: Internal Medicine | Admitting: Internal Medicine

## 2014-02-21 VITALS — BP 122/56 | HR 48 | Temp 98.2°F | Resp 18 | Ht 72.0 in | Wt 276.0 lb

## 2014-02-21 DIAGNOSIS — K760 Fatty (change of) liver, not elsewhere classified: Secondary | ICD-10-CM | POA: Diagnosis present

## 2014-02-21 DIAGNOSIS — R079 Chest pain, unspecified: Secondary | ICD-10-CM

## 2014-02-21 DIAGNOSIS — E785 Hyperlipidemia, unspecified: Secondary | ICD-10-CM | POA: Diagnosis not present

## 2014-02-21 DIAGNOSIS — E119 Type 2 diabetes mellitus without complications: Secondary | ICD-10-CM | POA: Diagnosis present

## 2014-02-21 DIAGNOSIS — E66811 Obesity, class 1: Secondary | ICD-10-CM | POA: Diagnosis present

## 2014-02-21 DIAGNOSIS — Z87891 Personal history of nicotine dependence: Secondary | ICD-10-CM | POA: Insufficient documentation

## 2014-02-21 DIAGNOSIS — R0789 Other chest pain: Secondary | ICD-10-CM

## 2014-02-21 DIAGNOSIS — J449 Chronic obstructive pulmonary disease, unspecified: Secondary | ICD-10-CM | POA: Diagnosis present

## 2014-02-21 DIAGNOSIS — E669 Obesity, unspecified: Secondary | ICD-10-CM | POA: Diagnosis present

## 2014-02-21 DIAGNOSIS — F419 Anxiety disorder, unspecified: Secondary | ICD-10-CM

## 2014-02-21 DIAGNOSIS — I10 Essential (primary) hypertension: Secondary | ICD-10-CM | POA: Diagnosis present

## 2014-02-21 DIAGNOSIS — G4733 Obstructive sleep apnea (adult) (pediatric): Secondary | ICD-10-CM | POA: Diagnosis present

## 2014-02-21 DIAGNOSIS — J42 Unspecified chronic bronchitis: Secondary | ICD-10-CM

## 2014-02-21 HISTORY — DX: Headache: R51

## 2014-02-21 HISTORY — DX: Type 2 diabetes mellitus without complications: E11.9

## 2014-02-21 HISTORY — DX: Personal history of other medical treatment: Z92.89

## 2014-02-21 HISTORY — DX: Low back pain, unspecified: M54.50

## 2014-02-21 HISTORY — DX: Dependence on other enabling machines and devices: Z99.89

## 2014-02-21 HISTORY — DX: Low back pain: M54.5

## 2014-02-21 HISTORY — DX: Other chronic pain: G89.29

## 2014-02-21 HISTORY — DX: Squamous cell carcinoma of skin of unspecified upper limb, including shoulder: C44.621

## 2014-02-21 HISTORY — DX: Calculus of kidney: N20.0

## 2014-02-21 HISTORY — DX: Anxiety disorder, unspecified: F41.9

## 2014-02-21 HISTORY — DX: Hesitancy of micturition: R39.11

## 2014-02-21 HISTORY — DX: Headache, unspecified: R51.9

## 2014-02-21 HISTORY — DX: Obstructive sleep apnea (adult) (pediatric): G47.33

## 2014-02-21 LAB — TROPONIN I
Troponin I: <0.03 ng/mL (ref <0.031)
Troponin I: <0.03 ng/mL (ref <0.031)

## 2014-02-21 LAB — BASIC METABOLIC PANEL
ANION GAP: 4 — AB (ref 5–15)
BUN: 27 mg/dL — ABNORMAL HIGH (ref 6–23)
CHLORIDE: 102 mmol/L (ref 96–112)
CO2: 28 mmol/L (ref 19–32)
CREATININE: 1.16 mg/dL (ref 0.50–1.35)
Calcium: 9.3 mg/dL (ref 8.4–10.5)
GFR calc non Af Amer: 60 mL/min — ABNORMAL LOW (ref >90)
GFR, EST AFRICAN AMERICAN: 69 mL/min — AB (ref >90)
Glucose, Bld: 115 mg/dL — ABNORMAL HIGH (ref 70–99)
Potassium: 4.1 mmol/L (ref 3.5–5.1)
SODIUM: 134 mmol/L — AB (ref 135–145)

## 2014-02-21 LAB — CBC WITH DIFFERENTIAL/PLATELET
Basophils Absolute: 0 10*3/uL (ref 0.0–0.1)
Basophils Relative: 0 % (ref 0–1)
EOS ABS: 0.1 10*3/uL (ref 0.0–0.7)
EOS PCT: 1 % (ref 0–5)
HEMATOCRIT: 41.8 % (ref 39.0–52.0)
HEMOGLOBIN: 13.5 g/dL (ref 13.0–17.0)
LYMPHS ABS: 3.1 10*3/uL (ref 0.7–4.0)
LYMPHS PCT: 32 % (ref 12–46)
MCH: 28.4 pg (ref 26.0–34.0)
MCHC: 32.3 g/dL (ref 30.0–36.0)
MCV: 87.8 fL (ref 78.0–100.0)
MONO ABS: 0.7 10*3/uL (ref 0.1–1.0)
Monocytes Relative: 7 % (ref 3–12)
Neutro Abs: 5.9 10*3/uL (ref 1.7–7.7)
Neutrophils Relative %: 60 % (ref 43–77)
Platelets: 271 10*3/uL (ref 150–400)
RBC: 4.76 MIL/uL (ref 4.22–5.81)
RDW: 14 % (ref 11.5–15.5)
WBC: 9.9 10*3/uL (ref 4.0–10.5)

## 2014-02-21 LAB — GLUCOSE, CAPILLARY
Glucose-Capillary: 95 mg/dL (ref 70–99)
Glucose-Capillary: 106 mg/dL — ABNORMAL HIGH (ref 70–99)

## 2014-02-21 MED ORDER — HEPARIN (PORCINE) IN NACL 100-0.45 UNIT/ML-% IJ SOLN
1750.0000 [IU]/h | INTRAMUSCULAR | Status: DC
  Administered 2014-02-21: 1250 [IU]/h via INTRAVENOUS
  Administered 2014-02-22: 1750 [IU]/h via INTRAVENOUS
  Administered 2014-02-22: 1600 [IU]/h via INTRAVENOUS
  Filled 2014-02-21 (×3): qty 250

## 2014-02-21 MED ORDER — OMEGA-3 FATTY ACIDS 1000 MG PO CAPS
2.0000 g | ORAL_CAPSULE | Freq: Two times a day (BID) | ORAL | Status: DC
  Filled 2014-02-21: qty 2

## 2014-02-21 MED ORDER — NITROGLYCERIN 0.4 MG SL SUBL
0.4000 mg | SUBLINGUAL_TABLET | SUBLINGUAL | Status: DC | PRN
  Administered 2014-02-21: 0.4 mg via SUBLINGUAL
  Filled 2014-02-21: qty 1

## 2014-02-21 MED ORDER — LOSARTAN POTASSIUM 50 MG PO TABS
100.0000 mg | ORAL_TABLET | Freq: Every day | ORAL | Status: DC
  Administered 2014-02-22 – 2014-02-23 (×2): 100 mg via ORAL
  Filled 2014-02-21 (×2): qty 2

## 2014-02-21 MED ORDER — ONDANSETRON HCL 4 MG/2ML IJ SOLN: 4.0000 mg | Freq: Four times a day (QID) | INTRAMUSCULAR | Status: DC | PRN

## 2014-02-21 MED ORDER — GI COCKTAIL ~~LOC~~: 30.0000 mL | Freq: Four times a day (QID) | ORAL | Status: DC | PRN

## 2014-02-21 MED ORDER — HYDROCODONE-ACETAMINOPHEN 7.5-325 MG PO TABS: 1.0000 | ORAL_TABLET | Freq: Four times a day (QID) | ORAL | Status: DC | PRN

## 2014-02-21 MED ORDER — ATORVASTATIN CALCIUM 20 MG PO TABS
20.0000 mg | ORAL_TABLET | Freq: Every day | ORAL | Status: DC
  Administered 2014-02-22 – 2014-02-23 (×2): 20 mg via ORAL
  Filled 2014-02-21 (×2): qty 1

## 2014-02-21 MED ORDER — NITROGLYCERIN 0.4 MG SL SUBL: 0.4000 mg | SUBLINGUAL_TABLET | SUBLINGUAL | Status: DC | PRN

## 2014-02-21 MED ORDER — OMEGA-3-ACID ETHYL ESTERS 1 G PO CAPS
2.0000 g | ORAL_CAPSULE | Freq: Two times a day (BID) | ORAL | Status: DC
  Administered 2014-02-21 – 2014-02-23 (×4): 2 g via ORAL
  Filled 2014-02-21 (×3): qty 2

## 2014-02-21 MED ORDER — DIAZEPAM 10 MG PO TABS: 10.0000 mg | ORAL_TABLET | Freq: Every evening | ORAL | Status: DC | PRN

## 2014-02-21 MED ORDER — ASPIRIN 81 MG PO CHEW
324.0000 mg | CHEWABLE_TABLET | Freq: Once | ORAL | Status: AC
  Administered 2014-02-21: 324 mg via ORAL
  Filled 2014-02-21: qty 4

## 2014-02-21 MED ORDER — NITROGLYCERIN 2 % TD OINT: 1.0000 [in_us] | TOPICAL_OINTMENT | Freq: Once | TRANSDERMAL | Status: DC

## 2014-02-21 MED ORDER — ACETAMINOPHEN 325 MG PO TABS: 650.0000 mg | ORAL_TABLET | ORAL | Status: DC | PRN

## 2014-02-21 MED ORDER — INSULIN ASPART 100 UNIT/ML ~~LOC~~ SOLN
0.0000 [IU] | Freq: Every day | SUBCUTANEOUS | Status: DC
Start: 2014-02-21 — End: 2014-02-23

## 2014-02-21 MED ORDER — HEPARIN BOLUS VIA INFUSION
4000.0000 [IU] | Freq: Once | INTRAVENOUS | Status: AC
  Administered 2014-02-21: 4000 [IU] via INTRAVENOUS
  Filled 2014-02-21: qty 4000

## 2014-02-21 MED ORDER — MORPHINE SULFATE 2 MG/ML IJ SOLN
2.0000 mg | Freq: Once | INTRAMUSCULAR | Status: AC
  Administered 2014-02-21: 2 mg via INTRAVENOUS
  Filled 2014-02-21: qty 1

## 2014-02-21 MED ORDER — INSULIN ASPART 100 UNIT/ML ~~LOC~~ SOLN
0.0000 [IU] | Freq: Three times a day (TID) | SUBCUTANEOUS | Status: DC
  Administered 2014-02-23: 1 [IU] via SUBCUTANEOUS

## 2014-02-21 MED ORDER — INSULIN GLARGINE 100 UNIT/ML ~~LOC~~ SOLN
10.0000 [IU] | Freq: Every day | SUBCUTANEOUS | Status: DC
  Administered 2014-02-21 – 2014-02-22 (×2): 10 [IU] via SUBCUTANEOUS
  Filled 2014-02-21 (×3): qty 0.1

## 2014-02-21 MED ORDER — PIOGLITAZONE HCL 30 MG PO TABS
30.0000 mg | ORAL_TABLET | Freq: Every day | ORAL | Status: DC
  Administered 2014-02-22 – 2014-02-23 (×2): 30 mg via ORAL
  Filled 2014-02-21 (×2): qty 1

## 2014-02-21 MED ORDER — MORPHINE SULFATE 2 MG/ML IJ SOLN: 2.0000 mg | INTRAMUSCULAR | Status: DC | PRN

## 2014-02-21 MED ORDER — DIAZEPAM 5 MG PO TABS: 10.0000 mg | ORAL_TABLET | Freq: Every evening | ORAL | Status: DC | PRN

## 2014-02-21 MED ORDER — PAROXETINE HCL 20 MG PO TABS
30.0000 mg | ORAL_TABLET | Freq: Every day | ORAL | Status: DC
  Administered 2014-02-22: 30 mg via ORAL
  Filled 2014-02-21: qty 1.5

## 2014-02-21 MED ORDER — DOXAZOSIN MESYLATE 2 MG PO TABS
2.0000 mg | ORAL_TABLET | Freq: Every day | ORAL | Status: DC
  Administered 2014-02-22 – 2014-02-23 (×2): 2 mg via ORAL
  Filled 2014-02-21 (×2): qty 1

## 2014-02-21 MED ORDER — ONDANSETRON HCL 4 MG/2ML IJ SOLN
4.0000 mg | Freq: Once | INTRAMUSCULAR | Status: AC
  Administered 2014-02-21: 4 mg via INTRAMUSCULAR
  Filled 2014-02-21: qty 2

## 2014-02-21 MED ORDER — ASPIRIN EC 81 MG PO TBEC
81.0000 mg | DELAYED_RELEASE_TABLET | Freq: Every morning | ORAL | Status: DC
  Administered 2014-02-22 – 2014-02-23 (×2): 81 mg via ORAL
  Filled 2014-02-21 (×2): qty 1

## 2014-02-21 MED ORDER — HYDROCODONE-ACETAMINOPHEN 7.5-325 MG PO TABS
1.0000 | ORAL_TABLET | Freq: Four times a day (QID) | ORAL | Status: DC | PRN
  Administered 2014-02-21 – 2014-02-23 (×4): 1 via ORAL
  Filled 2014-02-21 (×5): qty 1

## 2014-02-21 NOTE — Patient Instructions (Signed)
Please proceed to the ED on the first floor.

## 2014-02-21 NOTE — Progress Notes (Signed)
Pre visit review using our clinic review tool, if applicable. No additional management support is needed unless otherwise documented below in the visit note. 

## 2014-02-21 NOTE — Progress Notes (Signed)
Patient presents with left-sided sharp pleuritic chest pain, edema from sleep, resolved without intervention, so his PCP who transferred him to [facility], agent had negative troponins, EKG is nonacute, giving his risk factor including obesity, hyperlipidemia, diabetes, hypertension, admission requested for [facility] telemetry for chest pain rule out. Patient accepted to medical telemetry bed Phillips Climes MD

## 2014-02-21 NOTE — ED Notes (Signed)
CP stared this am-sent from PCP office in building

## 2014-02-21 NOTE — Assessment & Plan Note (Signed)
shaky spells overnight sound like hypoglycemia. Continue lantus at decreased dose, pt advised to check cbg if recurrent symptoms and call us.

## 2014-02-21 NOTE — Progress Notes (Signed)
ANTICOAGULATION CONSULT NOTE - Initial Consult  Pharmacy Consult for heparin Indication: chest pain/ACS  No Known Allergies  Patient Measurements: Height: 6' (182.9 cm) Weight: 273 lb 9.5 oz (124.1 kg) IBW/kg (Calculated) : 77.6 Heparin Dosing Weight: 105kg  Vital Signs: Temp: 97.9 F (36.6 C) (02/17 1648) Temp Source: Oral (02/17 1648) BP: 140/79 mmHg (02/17 1648) Pulse Rate: 48 (02/17 1648)  Labs:  Recent Labs  02/21/14 1220  HGB 13.5  HCT 41.8  PLT 271  CREATININE 1.16  TROPONINI <0.03    Estimated Creatinine Clearance: 74.9 mL/min (by C-G formula based on Cr of 1.16).   Medical History: Past Medical History  Diagnosis Date  . Arthritis   . Depression   . Diabetes mellitus   . Hypertension   . Hyperlipidemia   . History of gastric ulcer     Age 51- no recurrence since.  . Transfusion history     due to bleeding ulcer at age 46  . Hypogonadism male 01/11/2011  . Squamous cell carcinoma in situ of skin of forearm 01/23/2011  . Lung nodule     right 16 mm, seen initially 6/12. 41mm in 08/2011.  [name] GERD (gastroesophageal reflux disease)   . Headache(784.0)   . Fatty liver disease, nonalcoholic     Medications:  Prescriptions prior to admission  Medication Sig Dispense Refill Last Dose  . aspirin EC 81 MG tablet Take 81 mg by mouth every morning.    02/21/2014 at Unknown time  . atorvastatin (LIPITOR) 20 MG tablet Take 1 tablet (20 mg total) by mouth daily. 30 tablet 3 02/21/2014 at Unknown time  . diazepam (VALIUM) 10 MG tablet Take 1 tablet (10 mg total) by mouth at bedtime as needed for anxiety. 30 tablet 0 Past Week at Unknown time  . diltiazem (CARDIZEM CD) 180 MG 24 hr capsule Take 1 capsule (180 mg total) by mouth daily. 30 capsule 2 02/21/2014 at Unknown time  . doxazosin (CARDURA) 2 MG tablet Take 1 tablet (2 mg total) by mouth daily. 30 tablet 2 02/21/2014 at Unknown time  . fish oil-omega-3 fatty acids 1000 MG capsule Take 2 g by mouth 2 (two) times  daily.     02/21/2014 at Unknown time  . hydrochlorothiazide (HYDRODIURIL) 25 MG tablet Take 1 tablet (25 mg total) by mouth daily. 30 tablet 2 02/21/2014 at Unknown time  . HYDROcodone-acetaminophen (NORCO) 7.5-325 MG per tablet Take 1 tablet by mouth every 6 (six) hours as needed for moderate pain. 90 tablet 0 02/21/2014 at Unknown time  . LANTUS SOLOSTAR 100 UNIT/ML Solostar Pen INJECT 16 UNITS INTO THE SKIN AT BEDTIME (Patient taking differently: INJECT 12 UNITS INTO THE SKIN AT BEDTIME) 15 mL 2 02/20/2014 at Unknown time  . losartan (COZAAR) 100 MG tablet TAKE ONE TABLET BY MOUTH ONCE DAILY IN THE MORNING 30 tablet 5 02/21/2014 at Unknown time  . metFORMIN (GLUCOPHAGE) 500 MG tablet TAKE TWO TABLETS BY MOUTH TWICE DAILY WITH MEALS 120 tablet 1 02/21/2014 at Unknown time  . nitroGLYCERIN (NITROSTAT) 0.4 MG SL tablet Place 1 tablet (0.4 mg total) under the tongue every 5 (five) minutes x 3 doses as needed for chest pain (donot take with viagra). 60 tablet 12 02/21/2014 at Unknown time  . PARoxetine (PAXIL) 30 MG tablet TAKE ONE TABLET BY MOUTH EVERY MORNING 30 tablet 1 02/21/2014 at Unknown time  . pioglitazone (ACTOS) 30 MG tablet TAKE ONE TABLET BY MOUTH ONCE DAILY 30 tablet 3 02/21/2014 at Unknown time  . fluticasone (  FLONASE) 50 MCG/ACT nasal spray Place 2 sprays into both nostrils daily. (Patient not taking: Reported on 02/21/2014) 16 g 6 Not Taking at Unknown time   Scheduled:  . [START ON 02/22/2014] aspirin EC  81 mg Oral q morning - 10a  . [START ON 02/22/2014] atorvastatin  20 mg Oral Daily  . [START ON 02/22/2014] doxazosin  2 mg Oral Daily  . fish oil-omega-3 fatty acids  2 g Oral BID  . insulin aspart  0-5 Units Subcutaneous QHS  . [START ON 02/22/2014] insulin aspart  0-9 Units Subcutaneous TID WC  . insulin glargine  10 Units Subcutaneous Q2200  . [START ON 02/22/2014] losartan  100 mg Oral Daily  . nitroGLYCERIN  1 inch Topical Once  . [START ON 02/22/2014] PARoxetine  30 mg Oral Daily  .  [START ON 02/22/2014] pioglitazone  30 mg Oral Daily    Assessment: 74 yo male here with CP to start heparin for r/o ACS.   Goal of Therapy:  Heparin level 0.3-0.7 units/ml Monitor platelets by anticoagulation protocol: Yes   Plan:  -Heparin bolus 4000 units IV followed by 1250 units/hr (~ 12 units/kg/hr) -Heparin level in 8 hours and daily wth CBC daily  Hildred Laser, Pharm D 02/21/2014 6:36 PM

## 2014-02-21 NOTE — ED Provider Notes (Signed)
CSN: [id number]     Arrival date & time 02/21/14  1203 History   First MD Initiated Contact with Patient 02/21/14 1219     Chief Complaint  Patient presents with  . Chest Pain     (Consider location/radiation/quality/duration/timing/severity/associated sxs/prior Treatment) Patient is a 74 y.o. male presenting with chest pain. The history is provided by the patient.  Chest Pain Pain location:  Substernal area Pain quality: sharp   Pain radiates to:  Does not radiate Pain severity:  Moderate Onset quality:  Sudden Timing:  Constant Progression:  Unchanged Chronicity:  New Context: at rest   Relieved by:  Nothing Worsened by:  Nothing tried Associated symptoms: no abdominal pain, no cough, no heartburn, no shortness of breath and not vomiting     Past Medical History  Diagnosis Date  . Arthritis   . Depression   . Diabetes mellitus   . Hypertension   . Hyperlipidemia   . History of gastric ulcer     Age 16- no recurrence since.  . Transfusion history     due to bleeding ulcer at age 54  . Hypogonadism male 01/11/2011  . Squamous cell carcinoma in situ of skin of forearm 01/23/2011  . Lung nodule     right 16 mm, seen initially 6/12. 59mm in 08/2011.  [name] GERD (gastroesophageal reflux disease)   . Headache(784.0)   . Fatty liver disease, nonalcoholic    Past Surgical History  Procedure Laterality Date  . Eye surgery  08/2009    cataract removal left eye--Dr Katy Fitch  . Melanoma excision      left forearm  . Left heart catheterization with coronary angiogram N/A 11/17/2012    Procedure: LEFT HEART CATHETERIZATION WITH CORONARY ANGIOGRAM;  Surgeon: Burnell Blanks, MD;  Location: [facility] CATH LAB;  Service: Cardiovascular;  Laterality: N/A;   Family History  Problem Relation Age of Onset  . Arthritis Mother   . Heart disease Sister     Massive MI age 92.  . Arrhythmia Brother   . Melanoma Son   . Heart attack Brother    History  Substance Use Topics  . Smoking  status: Former Smoker -- 1.00 packs/day for 25 years    Types: Cigarettes    Quit date: 01/05/1982  . Smokeless tobacco: Never Used  . Alcohol Use: No    Review of Systems  Constitutional: Negative for chills.  Respiratory: Negative for cough and shortness of breath.   Cardiovascular: Positive for chest pain.  Gastrointestinal: Negative for heartburn, vomiting and abdominal pain.  All other systems reviewed and are negative.     Allergies  Review of patient's allergies indicates no known allergies.  Home Medications   Prior to Admission medications   Medication Sig Start Date End Date Taking? Authorizing Provider  aspirin EC 81 MG tablet Take 81 mg by mouth every morning.     Historical Provider, MD  atorvastatin (LIPITOR) 20 MG tablet Take 1 tablet (20 mg total) by mouth daily. 08/29/13   [name], NP  diazepam (VALIUM) 10 MG tablet Take 1 tablet (10 mg total) by mouth at bedtime as needed for anxiety. 02/21/14   [name], NP  diltiazem (CARDIZEM CD) 180 MG 24 hr capsule Take 1 capsule (180 mg total) by mouth daily. 02/09/14   [name], NP  doxazosin (CARDURA) 2 MG tablet Take 1 tablet (2 mg total) by mouth daily. 12/27/13   [name], NP  fish oil-omega-3 fatty acids 1000 MG capsule Take 2  g by mouth 2 (two) times daily.      Historical Provider, MD  fluticasone (FLONASE) 50 MCG/ACT nasal spray Place 2 sprays into both nostrils daily. 02/09/14   Rosalita Chessman, DO  hydrochlorothiazide (HYDRODIURIL) 25 MG tablet Take 1 tablet (25 mg total) by mouth daily. 01/16/14   [name], NP  HYDROcodone-acetaminophen (NORCO) 7.5-325 MG per tablet Take 1 tablet by mouth every 6 (six) hours as needed for moderate pain. 02/21/14   [name], NP  LANTUS SOLOSTAR 100 UNIT/ML Solostar Pen INJECT 16 UNITS INTO THE SKIN AT BEDTIME Patient taking differently: INJECT 12 UNITS INTO THE SKIN AT BEDTIME 02/09/14   [name], NP  losartan (COZAAR)  100 MG tablet TAKE ONE TABLET BY MOUTH ONCE DAILY IN THE MORNING 01/04/14   [name], NP  metFORMIN (GLUCOPHAGE) 500 MG tablet TAKE TWO TABLETS BY MOUTH TWICE DAILY WITH MEALS 02/18/14   [name], NP  nitroGLYCERIN (NITROSTAT) 0.4 MG SL tablet Place 1 tablet (0.4 mg total) under the tongue every 5 (five) minutes x 3 doses as needed for chest pain (donot take with viagra). 10/15/12   Ripudeep Krystal Eaton, MD  PARoxetine (PAXIL) 30 MG tablet TAKE ONE TABLET BY MOUTH EVERY MORNING 01/01/14   [name], NP  pioglitazone (ACTOS) 30 MG tablet TAKE ONE TABLET BY MOUTH ONCE DAILY    [name], NP   BP 136/67 mmHg  Pulse 50  Temp(Src) 97.3 F (36.3 C) (Oral)  Ht 6' (1.829 m)  Wt 276 lb (125.193 kg)  BMI 37.42 kg/m2  SpO2 97% Physical Exam  Constitutional: He is oriented to person, place, and time. He appears well-developed and well-nourished. No distress.  HENT:  Head: Normocephalic and atraumatic.  Mouth/Throat: No oropharyngeal exudate.  Eyes: EOM are normal. Pupils are equal, round, and reactive to light.  Neck: Normal range of motion. Neck supple.  Cardiovascular: Normal rate and regular rhythm.  Exam reveals no friction rub.   No murmur heard. Pulmonary/Chest: Effort normal and breath sounds normal. No respiratory distress. He has no wheezes. He has no rales.  Abdominal: Soft. He exhibits no distension. There is no tenderness. There is no rebound.  Musculoskeletal: Normal range of motion. He exhibits no edema.  Neurological: He is alert and oriented to person, place, and time.  Skin: Skin is warm. He is not diaphoretic.  Nursing note and vitals reviewed.   ED Course  Procedures (including critical care time) Labs Review Labs Reviewed  CBC WITH DIFFERENTIAL/PLATELET  BASIC METABOLIC PANEL  TROPONIN I    Imaging Review Dg Chest 2 View  02/21/2014   CLINICAL DATA:  Chest pain  EXAM: CHEST  2 VIEW  COMPARISON:  04/12/2013  FINDINGS: There is borderline  cardiomegaly which is stable from prior. Aortic hilar contours are also unchanged. Mild hyperinflation. There is no edema, consolidation, effusion, or pneumothorax.  IMPRESSION: 1. No active cardiopulmonary disease. 2. Chronic hyperinflation.   Electronically Signed   By: Monte Fantasia M.D.   On: 02/21/2014 13:03     EKG Interpretation   Date/Time:  Wednesday February 21 2014 12:14:24 EST Ventricular Rate:  48 PR Interval:  194 QRS Duration: 104 QT Interval:  438 QTC Calculation: 391 R Axis:   -14 Text Interpretation:  Sinus bradycardia Otherwise normal ECG No  significant change since last tracing Confirmed by Mingo Amber  MD, [place]  ([id number]) on 02/21/2014 12:19:58 PM      MDM   Final diagnoses:  Chest pain, unspecified chest pain type  38M here with atypical CP. Sharp, awoke him from sleep. New. Sent from [facility] clinic above our ED.  EKG shows sinus bradycardia, no ischemic changes. 3/10 now with his pain. HEART score of 5 - 2 for age and 2 for risk factors and 1 for moderately suspicious story. Plan on labs, admission.  Labs ok. CXR ok. Admitted.  Evelina Bucy, MD 02/21/14 [phone]

## 2014-02-21 NOTE — H&P (Signed)
[facility] History and Physical                                                                                    Christian Morris, is a 74 y.o. male  MRN: [id number]   DOB - July 02, 1940  Admit Date - 02/21/2014  Outpatient Primary MD for the patient is Nance Pear., NP  With History of -  Past Medical History  Diagnosis Date  . Arthritis   . Depression   . Diabetes mellitus   . Hypertension   . Hyperlipidemia   . History of gastric ulcer     Age 71- no recurrence since.  . Transfusion history     due to bleeding ulcer at age 35  . Hypogonadism male 01/11/2011  . Squamous cell carcinoma in situ of skin of forearm 01/23/2011  . Lung nodule     right 16 mm, seen initially 6/12. 19mm in 08/2011.  [name] GERD (gastroesophageal reflux disease)   . Headache(784.0)   . Fatty liver disease, nonalcoholic       Past Surgical History  Procedure Laterality Date  . Eye surgery  08/2009    cataract removal left eye--Dr Katy Fitch  . Melanoma excision      left forearm  . Left heart catheterization with coronary angiogram N/A 11/17/2012    Procedure: LEFT HEART CATHETERIZATION WITH CORONARY ANGIOGRAM;  Surgeon: Burnell Blanks, MD;  Location: [facility] CATH LAB;  Service: Cardiovascular;  Laterality: N/A;    in for   Chief Complaint  Patient presents with  . Chest Pain     HPI Christian Morris  is a 74 y.o. male, with past medical history of diabetes, obesity, hypertension, dyslipidemia and sleep apnea who has been recently noncompliant with CPAP. He recently telephoned his 47 office on 2/16 with issues regarding symptoms that sounded suspicious for hypoglycemia. He was informed to decrease his long-acting insulin from 16 units to 12 units which he did. Overnight into 2/17 patient was awakened at 4:30 AM from sleep with left anterior chest pain that did not radiate that was pressure-like and had no associated symptoms. He reports the pain lasted about 30 minutes or more. He  endorsed that he must been very tired because he fell back to sleep and when he awakened the chest pain had resolved. He had previously been scheduled to follow-up with his primary care physician on that same morning and upon evaluation and discussion regarding the chest pain he experienced early in the morning he was sent to [facility] for further evaluation. Patient reported that CBG today was 40.  Evaluation at [facility] revealed an initial troponin of less than 0.03, glucose was 115. EKG revealed sinus bradycardia with a heart rate of 48. No definitive ischemic changes and QTC 391 ms. Sodium was mildly decreased at 134 and BUN was elevated at 27 which appears to be typical for this patient. Chest x-ray was unremarkable. He was subsequently sent to [facility] as a direct admission.  Review of Systems   In addition to the HPI above,  No Fever-chills, myalgias or other constitutional symptoms Chronic Headache which patient attributes to  difficulty sleeping at night and pillow, No changes with Vision or hearing, new weakness, tingling, numbness in any extremity, No problems swallowing food or Liquids, indigestion/reflux No Chest pain, Cough or Shortness of Breath, palpitations, orthopnea or DOE; says has a CPAP machine at home but states does not use it because he does not think he needs it No Abdominal pain, N/V; no melena or hematochezia, no dark tarry stools, Bowel movements are regular, No dysuria, hematuria or flank pain No new skin rashes, lesions, masses or bruises, No new joints pains-aches No recent weight gain or loss No polyuria, polydypsia or polyphagia, No recent travel or unilateral swelling in lower extremities  *A full 10 point Review of Systems was done, except as stated above, all other Review of Systems were negative.  Social History History  Substance Use Topics  . Smoking status: Former Smoker -- 1.00 packs/day for 25 years    Types: Cigarettes     Quit date: 01/05/1982  . Smokeless tobacco: Never Used  . Alcohol Use: No    Family History Family History  Problem Relation Age of Onset  . Arthritis Mother   . Heart disease Sister     Massive MI age 98.  . Arrhythmia Brother   . Melanoma Son   . Heart attack Brother     Prior to Admission medications   Medication Sig Start Date End Date Taking? Authorizing Provider  aspirin EC 81 MG tablet Take 81 mg by mouth every morning.    Yes Historical Provider, MD  atorvastatin (LIPITOR) 20 MG tablet Take 1 tablet (20 mg total) by mouth daily. 08/29/13  Yes [name], NP  diazepam (VALIUM) 10 MG tablet Take 1 tablet (10 mg total) by mouth at bedtime as needed for anxiety. 02/21/14  Yes [name], NP  diltiazem (CARDIZEM CD) 180 MG 24 hr capsule Take 1 capsule (180 mg total) by mouth daily. 02/09/14  Yes [name], NP  doxazosin (CARDURA) 2 MG tablet Take 1 tablet (2 mg total) by mouth daily. 12/27/13  Yes [name], NP  fish oil-omega-3 fatty acids 1000 MG capsule Take 2 g by mouth 2 (two) times daily.     Yes Historical Provider, MD  hydrochlorothiazide (HYDRODIURIL) 25 MG tablet Take 1 tablet (25 mg total) by mouth daily. 01/16/14  Yes [name], NP  HYDROcodone-acetaminophen (NORCO) 7.5-325 MG per tablet Take 1 tablet by mouth every 6 (six) hours as needed for moderate pain. 02/21/14  Yes [name], NP  LANTUS SOLOSTAR 100 UNIT/ML Solostar Pen INJECT 16 UNITS INTO THE SKIN AT BEDTIME Patient taking differently: INJECT 12 UNITS INTO THE SKIN AT BEDTIME 02/09/14  Yes [name], NP  losartan (COZAAR) 100 MG tablet TAKE ONE TABLET BY MOUTH ONCE DAILY IN THE MORNING 01/04/14  Yes [name], NP  metFORMIN (GLUCOPHAGE) 500 MG tablet TAKE TWO TABLETS BY MOUTH TWICE DAILY WITH MEALS 02/18/14  Yes [name], NP  nitroGLYCERIN (NITROSTAT) 0.4 MG SL tablet Place 1 tablet (0.4 mg total) under the tongue every 5 (five) minutes x 3  doses as needed for chest pain (donot take with viagra). 10/15/12  Yes Ripudeep Krystal Eaton, MD  PARoxetine (PAXIL) 30 MG tablet TAKE ONE TABLET BY MOUTH EVERY MORNING 01/01/14  Yes [name], NP  pioglitazone (ACTOS) 30 MG tablet TAKE ONE TABLET BY MOUTH ONCE DAILY   Yes [name], NP  fluticasone (FLONASE) 50 MCG/ACT nasal spray Place 2 sprays into both nostrils daily. Patient not taking: Reported on 02/21/2014  02/09/14   Rosalita Chessman, DO    No Known Allergies  Physical Exam  Vitals  Blood pressure 140/79, pulse 48, temperature 97.9 F (36.6 C), temperature source Oral, resp. rate 20, height 6' (1.829 m), weight 273 lb 9.5 oz (124.1 kg), SpO2 96 %.   General:  In no acute distress, appears healthy and well nourished/obese-currently denies chest pain or pressure  Psych:  Normal affect, Denies Suicidal or Homicidal ideations, Awake Alert, Oriented X 3. Speech and thought patterns are clear and appropriate, no apparent short term memory deficits  Neuro:   No focal neurological deficits, CN II through XII intact, Strength 5/5 all 4 extremities, Sensation intact all 4 extremities.  ENT:  Ears and Eyes appear Normal, Conjunctivae clear, PER. Moist oral mucosa without erythema or exudates.  Neck:  Supple, No lymphadenopathy appreciated  Respiratory:  Symmetrical chest wall movement, Good air movement bilaterally, CTAB. Room Air  Cardiac:  RRR, No Murmurs, no LE edema noted, no JVD, No carotid bruits, peripheral pulses palpable at 2+  Abdomen:  Positive bowel sounds, Soft, Non tender, Non distended,  No masses appreciated, no obvious hepatosplenomegaly difficult to ascertain given patient's large abdominal girth  Skin:  No Cyanosis, Normal Skin Turgor, No Skin Rash or Bruise.  Extremities: Symmetrical without obvious trauma or injury,  no effusions.  Data Review  CBC  Recent Labs Lab 02/21/14 1220  WBC 9.9  HGB 13.5  HCT 41.8  PLT 271  MCV 87.8  MCH 28.4  MCHC  32.3  RDW 14.0  LYMPHSABS 3.1  MONOABS 0.7  EOSABS 0.1  BASOSABS 0.0    Chemistries   Recent Labs Lab 02/21/14 1220  NA 134*  K 4.1  CL 102  CO2 28  GLUCOSE 115*  BUN 27*  CREATININE 1.16  CALCIUM 9.3    estimated creatinine clearance is 74.9 mL/min (by C-G formula based on Cr of 1.16).  No results for input(s): TSH, T4TOTAL, T3FREE, THYROIDAB in the last 72 hours.  Invalid input(s): FREET3  Coagulation profile No results for input(s): INR, PROTIME in the last 168 hours.  No results for input(s): DDIMER in the last 72 hours.  Cardiac Enzymes  Recent Labs Lab 02/21/14 1220  TROPONINI <0.03    Invalid input(s): POCBNP  Urinalysis    Component Value Date/Time   COLORURINE RED* 01/07/2013 0324   APPEARANCEUR TURBID* 01/07/2013 0324   LABSPEC 1.025 01/07/2013 0324   PHURINE 6.0 01/07/2013 0324   GLUCOSEU 100* 01/07/2013 0324   HGBUR LARGE* 01/07/2013 0324   BILIRUBINUR negative 11/14/2013 1327   BILIRUBINUR MODERATE* 01/07/2013 0324   KETONESUR NEGATIVE 01/07/2013 0324   PROTEINUR 0.15 11/14/2013 1327   PROTEINUR 100* 01/07/2013 0324   UROBILINOGEN 4.0 11/14/2013 1327   UROBILINOGEN 1.0 01/07/2013 0324   NITRITE negative 11/14/2013 1327   NITRITE NEGATIVE 01/07/2013 0324   LEUKOCYTESUR Negative 11/14/2013 1327    Imaging results:   Dg Chest 2 View  02/21/2014   CLINICAL DATA:  Chest pain  EXAM: CHEST  2 VIEW  COMPARISON:  04/12/2013  FINDINGS: There is borderline cardiomegaly which is stable from prior. Aortic hilar contours are also unchanged. Mild hyperinflation. There is no edema, consolidation, effusion, or pneumothorax.  IMPRESSION: 1. No active cardiopulmonary disease. 2. Chronic hyperinflation.   Electronically Signed   By: Monte Fantasia M.D.   On: 02/21/2014 13:03     EKG: Sinus bradycardia with rate 48, QTC 391, no definitive ischemic changes   Assessment & Plan  Active Problems:  Chest pain  -Admit to telemetry unit -Patient has  both typical as well as atypical features so as precaution we'll treat as ischemic in etiology and begin full dose heparin with aspirin given multiple risk factors -currently chest pain-free -Heart score calculated at 6 -No beta blocker due to bradycardia -Consult cardiology-spoke with on-call cardiologist ,okay to defer consultation until a.m. on 2/18 since at this time enzymes are negative and no symptoms concerning for acute ischemic event-cardiology requests to be notified on 2/18 for formal consultation-call sooner if enzymes return positive -Continue statin -Unsure if recent hypoglycemia triggering factor for chest pain symptoms -Cycle cardiac enzymes -Last echocardiogram was in 2014 revealed preserved LV function; if enzymes returned positive recommend obtaining echocardiogram this admission    Diabetes mellitus with hypoglycemia -According to documentation from primary care physician's office patient presented on 2/17 to discuss possible hypoglycemic events noting he awakens every morning between 2 and 3 AM feeling jittery. -Patient at times seems to be a poor historian noting he told me that his sugar was 40 this morning yet he reported to the primary care physician's office that after adjusting his insulin the previous night he did not have shakiness, no mention about actual CBG reading in that note. -We'll go ahead and reduce her Lantus further to 10 units tonight and provide sliding scale insulin and monitor CBGs closely -Hold metformin in the event patient needs contrasted study such as cardiac catheterization this admission    OSA on CPAP/morbid obesity -Patient endorses has been noncompliant at home -Will order while here    Essential hypertension -Current blood pressure well controlled -Continue all preadmission medications except for Cardizem since having bradycardia    COPD  -Currently asymptomatic and without wheezing    Hyperlipidemia -Continue statin     Hepatic  steatosis -Previous LFTs have been normal    DVT Prophylaxis: Full dose IV heparin  Family Communication:  No family at bedside  Code Status: Full   Condition:  stable  Time spent in minutes : 60   ELLIS,ALLISON L. ANP on 02/21/2014 at 5:21 PM  Between 7am to 7pm - Pager - [phone]  After 7pm go to [email or web address] - password [email or web address]  And look for the night coverage person covering me after hours  [facility]  Addendum  I personally evaluated patient on 02/21/2014 and agree with the above findings.  Patient is a pleasant 74 year old gentleman with a past medical history of diabetes, hypertension, dyslipidemia, morbid obesity, presenting as a direct admission for further evaluation of chest pain. He stated experiencing severe 8 out of 10 chest pain that woke him up early this morning located in the left side of his chest characterized as tight/pressure-like, non-radiating, lasting 20 minutes and gradually easing off. Denied associated nausea/vomiting/shortness of breath. Initial workup included a troponin which came back negative, chest x-ray did not show acute cardiopulmonary disease, EKG not showing acute ischemic changes. Patient having heart score of 6, cardiology consulted. On my evaluation he did not have chest pain, lungs were clear, normal heart sounds. Will cycle troponins overnight. Starting IV heparin. Will continue aspirin at 81 mg PO q daily, ARB, statin, hold calcium channel blocker given presence of bradycardia, place nitroglycerin patch, await further recommendations from cardiology.

## 2014-02-21 NOTE — Progress Notes (Signed)
Subjective:    Patient ID: Christian Morris, male    DOB: 01-27-1940, 74 y.o.   MRN: [id number]  HPI  Patient here to discuss possible hypoglycemic events  He reports feeling jittery between 2 and 3 AM each morning.  Reports that he decreased his lantus dose from 16 units to 12 units.  Did not have shakiness this AM.  Lab Results  Component Value Date   HGBA1C 6.9* 12/27/2013    Chest pain- awoke him at 4:30AM. Pain located left side of chest.  Pain is described as sharp initially (10/10) now pain is described as 3-4/10. Pain is not associated with SOB or by inspiration. Pain is non-radiating. Denies associated nausea. Denies hx of pain this bad before.     + HA- reports HA is improved by laying flat on his pillow.    Past Medical History  Diagnosis Date  . Arthritis   . Depression   . Diabetes mellitus   . Hypertension   . Hyperlipidemia   . History of gastric ulcer     Age 27- no recurrence since.  . Transfusion history     due to bleeding ulcer at age 45  . Hypogonadism male 01/11/2011  . Squamous cell carcinoma in situ of skin of forearm 01/23/2011  . Lung nodule     right 16 mm, seen initially 6/12. 54mm in 08/2011.  [name] GERD (gastroesophageal reflux disease)   . Headache(784.0)   . Fatty liver disease, nonalcoholic     Review of Systems     Objective:    Physical Exam  Constitutional: He is oriented to person, place, and time. He appears well-developed and well-nourished. No distress.  HENT:  Head: Normocephalic and atraumatic.  Cardiovascular: Regular rhythm.  Bradycardia present.   No murmur heard. Pulmonary/Chest: Effort normal and breath sounds normal. He exhibits no tenderness.  Neurological: He is alert and oriented to person, place, and time.  Skin: Skin is warm and dry.  Psychiatric: He has a normal mood and affect. His behavior is normal. Judgment and thought content normal.    Ht 6' (1.829 m)  Wt 276 lb (125.193 kg)  BMI 37.42 kg/m2 Wt Readings from  Last 3 Encounters:  02/21/14 276 lb (125.193 kg)  02/09/14 281 lb 6.4 oz (127.642 kg)  12/27/13 285 lb (129.275 kg)     Lab Results  Component Value Date   WBC 10.0 01/17/2013   HGB 11.9* 01/17/2013   HCT 36.2* 01/17/2013   PLT 481* 01/17/2013   GLUCOSE 144* 12/27/2013   CHOL 156 07/17/2013   TRIG 173* 07/17/2013   HDL 42 07/17/2013   LDLCALC 79 07/17/2013   ALT 9 07/17/2013   AST 13 07/17/2013   NA 137 12/27/2013   K 4.5 12/27/2013   CL 100 12/27/2013   CREATININE 1.5 12/27/2013   BUN 36* 12/27/2013   CO2 25 12/27/2013   TSH 0.576 01/04/2012   PSA 2.13 07/17/2013   INR 1.1* 11/15/2012   HGBA1C 6.9* 12/27/2013   MICROALBUR 6.59* 07/17/2013    Dg Chest 2 View  04/12/2013   CLINICAL DATA:  Cough with bibasilar crackles.  EXAM: CHEST  2 VIEW  COMPARISON:  DG CHEST 2 VIEW dated 12/13/2012; CT CHEST W/O CM dated 12/09/2012  FINDINGS: Trachea is midline. Heart size normal. Linear densities are seen at the lung bases. Calcified granuloma in the right lower lobe, best seen on the lateral view. No airspace consolidation or pleural fluid.  IMPRESSION: Linear opacities at the  lung bases are likely due to scarring.   Electronically Signed   By: Lorin Picket M.D.   On: 04/12/2013 15:19       Assessment & Plan:   Chest pain- multiple risk factors for CAD. EKG notes NSR- bradycardia. Chest pain is ongoing. Will refer to to ED for further evaluation.  Report given to RN in ED- Caryl Pina.

## 2014-02-22 ENCOUNTER — Inpatient Hospital Stay (HOSPITAL_COMMUNITY): Payer: PPO

## 2014-02-22 DIAGNOSIS — E119 Type 2 diabetes mellitus without complications: Secondary | ICD-10-CM | POA: Diagnosis not present

## 2014-02-22 DIAGNOSIS — I10 Essential (primary) hypertension: Secondary | ICD-10-CM

## 2014-02-22 DIAGNOSIS — R079 Chest pain, unspecified: Principal | ICD-10-CM

## 2014-02-22 DIAGNOSIS — E08649 Diabetes mellitus due to underlying condition with hypoglycemia without coma: Secondary | ICD-10-CM

## 2014-02-22 DIAGNOSIS — E785 Hyperlipidemia, unspecified: Secondary | ICD-10-CM | POA: Diagnosis not present

## 2014-02-22 DIAGNOSIS — G4733 Obstructive sleep apnea (adult) (pediatric): Secondary | ICD-10-CM

## 2014-02-22 LAB — LIPID PANEL
Cholesterol: 249 mg/dL — ABNORMAL HIGH (ref 0–200)
HDL: 31 mg/dL — ABNORMAL LOW (ref >39)
LDL Cholesterol: 144 mg/dL — ABNORMAL HIGH (ref 0–99)
Total CHOL/HDL Ratio: 8 RATIO
Triglycerides: 372 mg/dL — ABNORMAL HIGH (ref <150)
VLDL: 74 mg/dL — ABNORMAL HIGH (ref 0–40)

## 2014-02-22 LAB — CBC
HEMATOCRIT: 38 % — AB (ref 39.0–52.0)
Hemoglobin: 12.3 g/dL — ABNORMAL LOW (ref 13.0–17.0)
MCH: 28.6 pg (ref 26.0–34.0)
MCHC: 32.4 g/dL (ref 30.0–36.0)
MCV: 88.4 fL (ref 78.0–100.0)
Platelets: 235 10*3/uL (ref 150–400)
RBC: 4.3 MIL/uL (ref 4.22–5.81)
RDW: 13.8 % (ref 11.5–15.5)
WBC: 10.1 10*3/uL (ref 4.0–10.5)

## 2014-02-22 LAB — HEPARIN LEVEL (UNFRACTIONATED)
HEPARIN UNFRACTIONATED: 0.11 [IU]/mL — AB (ref 0.30–0.70)
Heparin Unfractionated: 0.31 IU/mL (ref 0.30–0.70)

## 2014-02-22 LAB — GLUCOSE, CAPILLARY
GLUCOSE-CAPILLARY: 112 mg/dL — AB (ref 70–99)
GLUCOSE-CAPILLARY: 108 mg/dL — AB (ref 70–99)
GLUCOSE-CAPILLARY: 112 mg/dL — AB (ref 70–99)
GLUCOSE-CAPILLARY: 122 mg/dL — AB (ref 70–99)

## 2014-02-22 LAB — TSH: TSH: 0.55 u[IU]/mL (ref 0.350–4.500)

## 2014-02-22 LAB — TROPONIN I: Troponin I: <0.03 ng/mL (ref <0.031)

## 2014-02-22 MED ORDER — TECHNETIUM TC 99M SESTAMIBI GENERIC - CARDIOLITE
30.0000 | Freq: Once | INTRAVENOUS | Status: AC | PRN
Start: 2014-02-22 — End: 2014-02-22
  Administered 2014-02-22: 30 via INTRAVENOUS

## 2014-02-22 MED ORDER — REGADENOSON 0.4 MG/5ML IV SOLN
INTRAVENOUS | Status: AC
  Filled 2014-02-22: qty 5

## 2014-02-22 MED ORDER — HEPARIN BOLUS VIA INFUSION
3000.0000 [IU] | Freq: Once | INTRAVENOUS | Status: AC
  Administered 2014-02-22: 3000 [IU] via INTRAVENOUS
  Filled 2014-02-22: qty 3000

## 2014-02-22 MED ORDER — REGADENOSON 0.4 MG/5ML IV SOLN
0.4000 mg | Freq: Once | INTRAVENOUS | Status: AC
  Administered 2014-02-23: 0.4 mg via INTRAVENOUS
  Filled 2014-02-22: qty 5

## 2014-02-22 NOTE — Progress Notes (Signed)
PROGRESS NOTE  Christian Morris XNA:[id number] DOB: 05-23-1940 DOA: 02/21/2014 PCP: Nance Pear., NP  HPI/Recap of past 24 hours: Currently denies chest pain, no sob, no edema Sinus bradycardia on tele, sometimes in the higt 40's, asymptomatic  Assessment/Plan: Principal Problem:   Chest pain Active Problems:   Diabetes mellitus with hypoglycemia   Hyperlipidemia   OBESITY, MORBID   OSA on CPAP   Essential hypertension   COPD (chronic obstructive pulmonary disease)   Hepatic steatosis  Chest pain  -Admit to telemetry unit --Heart score calculated at 6, reported has been getting daily exercise at the [facility] for the last three years, but report getting progressive weak,  -No beta blockerm, no ccb due to bradycardia -Continue statin -Unsure if recent hypoglycemia triggering factor for chest pain symptoms -negative cardiac enzymes -Last echocardiogram was in 2014 revealed preserved LV function;  --heparin drip started at admission due to concerning of angina -cardiology consulted, patient to have 2day stress test    Diabetes mellitus with hypoglycemia -According to documentation from primary care physician's office patient presented on 2/17 to discuss possible hypoglycemic events noting he awakens every morning between 2 and 3 AM feeling jittery. -- reduce  Lantus  to 10 units and provide sliding scale insulin and monitor CBGs closely -Hold metformin in the event patient needs contrasted study such as cardiac catheterization this admission -a1c pending   OSA on CPAP/morbid obesity -Patient endorses has been noncompliant at home -Will order while here   Essential hypertension -Current blood pressure well controlled -Continue all preadmission medications except for Cardizem since having bradycardia   COPD  -Currently asymptomatic and without wheezing, quit smoking in 1980's   Hyperlipidemia -Continue statin   Hepatic steatosis -Previous LFTs have been  normal  Obesity: life style changes  Code Status: full  Family Communication: patient  Disposition Plan: home when stable   Consultants:  cardiology  Procedures:  Stress test  Antibiotics:  none   Objective: BP 111/57 mmHg  Pulse 68  Temp(Src) 98.4 F (36.9 C) (Oral)  Resp 20  Ht 6' (1.829 m)  Wt 125.147 kg (275 lb 14.4 oz)  BMI 37.41 kg/m2  SpO2 96%  Intake/Output Summary (Last 24 hours) at 02/22/14 1240 Last data filed at 02/22/14 0700  Gross per 24 hour  Intake 439.47 ml  Output    700 ml  Net -260.53 ml   Filed Weights   02/21/14 1217 02/21/14 1709 02/22/14 0400  Weight: 125.193 kg (276 lb) 124.1 kg (273 lb 9.5 oz) 125.147 kg (275 lb 14.4 oz)    Exam: General: In no acute distress, appears healthy and well nourished/obese-currently denies chest pain or pressure  Psych: Normal affect, Denies Suicidal or Homicidal ideations, Awake Alert, Oriented X 3. Speech and thought patterns are clear and appropriate, no apparent short term memory deficits  Neuro: No focal neurological deficits, CN II through XII intact, Strength 5/5 all 4 extremities, Sensation intact all 4 extremities.  ENT: Ears and Eyes appear Normal, Conjunctivae clear, PER. Moist oral mucosa without erythema or exudates.  Neck: Supple, No lymphadenopathy appreciated  Respiratory: Symmetrical chest wall movement, Good air movement bilaterally, CTAB. Room Air  Cardiac: RRR, No Murmurs, no LE edema noted, no JVD, No carotid bruits, peripheral pulses palpable at 2+  Abdomen: Positive bowel sounds, Soft, Non tender, Non distended, No masses appreciated, no obvious hepatosplenomegaly difficult to ascertain given patient's large abdominal girth  Skin: No Cyanosis, Normal Skin Turgor, No Skin Rash or Bruise.  Extremities: Symmetrical  without obvious trauma or injury, no effusions.   Data Reviewed: Basic Metabolic Panel:  Recent Labs Lab 02/21/14 1220  NA 134*  K 4.1  CL 102    CO2 28  GLUCOSE 115*  BUN 27*  CREATININE 1.16  CALCIUM 9.3   Liver Function Tests: No results for input(s): AST, ALT, ALKPHOS, BILITOT, PROT, ALBUMIN in the last 168 hours. No results for input(s): LIPASE, AMYLASE in the last 168 hours. No results for input(s): AMMONIA in the last 168 hours. CBC:  Recent Labs Lab 02/21/14 1220 02/22/14 0034  WBC 9.9 10.1  NEUTROABS 5.9  --   HGB 13.5 12.3*  HCT 41.8 38.0*  MCV 87.8 88.4  PLT 271 235   Cardiac Enzymes:    Recent Labs Lab 02/21/14 1220 02/21/14 1941 02/21/14 2109 02/22/14 0034  TROPONINI <0.03 <0.03 <0.03 <0.03   BNP (last 3 results) No results for input(s): BNP in the last 8760 hours.  ProBNP (last 3 results) No results for input(s): PROBNP in the last 8760 hours.  CBG:  Recent Labs Lab 02/21/14 1646 02/21/14 2105 02/22/14 0739 02/22/14 1144  GLUCAP 95 106* 112* 108*    No results found for this or any previous visit (from the past 240 hour(s)).   Studies: Dg Chest 2 View  02/21/2014   CLINICAL DATA:  Chest pain  EXAM: CHEST  2 VIEW  COMPARISON:  04/12/2013  FINDINGS: There is borderline cardiomegaly which is stable from prior. Aortic hilar contours are also unchanged. Mild hyperinflation. There is no edema, consolidation, effusion, or pneumothorax.  IMPRESSION: 1. No active cardiopulmonary disease. 2. Chronic hyperinflation.   Electronically Signed   By: Monte Fantasia M.D.   On: 02/21/2014 13:03    Scheduled Meds: . aspirin EC  81 mg Oral q morning - 10a  . atorvastatin  20 mg Oral Daily  . doxazosin  2 mg Oral Daily  . insulin aspart  0-5 Units Subcutaneous QHS  . insulin aspart  0-9 Units Subcutaneous TID WC  . insulin glargine  10 Units Subcutaneous Q2200  . losartan  100 mg Oral Daily  . omega-3 acid ethyl esters  2 g Oral BID  . PARoxetine  30 mg Oral Daily  . pioglitazone  30 mg Oral Daily  . regadenoson  0.4 mg Intravenous Once    Continuous Infusions: . heparin 1,750 Units/hr  (02/22/14 1206)     Mulki Roesler  [facility] Pager [phone]. If 7PM-7AM, please contact night-coverage at [email or web address], password [email or web address] 02/22/2014, 12:40 PM  LOS: 1 day

## 2014-02-22 NOTE — Progress Notes (Signed)
UR Completed Leslee Suire Graves-Bigelow, RN,BSN [phone]  

## 2014-02-22 NOTE — Consult Note (Signed)
Admit date: 02/21/2014 Referring Physician  Dr. Erlinda Hong Primary Physician  [name] Primary Cardiologist  None Reason for Consultation  Chest pain  Christian Morris is a 74 y.o. male, with past medical history of diabetes, obesity, hypertension, dyslipidemia and sleep apnea who has been recently noncompliant with CPAP. He recently telephoned his 54 office on 2/16 with issues regarding symptoms that sounded suspicious for hypoglycemia. He was informed to decrease his long-acting insulin from 16 units to 12 units which he did. Overnight into 2/17 patient was awakened at 4:30 AM from sleep with left anterior chest pain that did not radiate that was pressure-like and had no associated symptoms. He reports the pain lasted about 30 minutes or more. He endorsed that he must been very tired because he fell back to sleep and when he awakened the chest pain had resolved. He had previously been scheduled to follow-up with his primary care physician on that same morning and upon evaluation and discussion regarding the chest pain he experienced early in the morning he was sent to [facility] for further evaluation. Patient reported that CBG today was 40.  He is very vague in his description of the chest discomfort.  It has been occurring several times weekly for some time and can be off an on all day.  There is no radiation of the discomfort and no associated SOB, diaphoresis or nausea.  He denies any DOE.  The episode of CP that brought him to ER was much more severe than usual.  He has rule out by enzymes and EKG shows nonspecific T wave abnormality.   Cardiology is now asked to consult for evaluation of chest pain.       PMH:   Past Medical History  Diagnosis Date  . Hypertension   . Hyperlipidemia   . History of gastric ulcer ~ 1958    no recurrence since.  . Hypogonadism male 01/11/2011  . Lung nodule     right 16 mm, seen initially 6/12. 3mm in 08/2011.  [name] GERD  (gastroesophageal reflux disease)   . Fatty liver disease, nonalcoholic   . Kidney stones   . Squamous cell cancer of skin of forearm 01/23/2011    left  . Depression   . Type II diabetes mellitus   . History of blood transfusion ~ 1958    "related to bleeding ulcers"  . Daily headache     "here lately" (02/21/2014)  . Arthritis     "legs" (02/21/2014)  . Chronic lower back pain   . Anxiety   . OSA on CPAP   . Urinary hesitancy      PSH:   Past Surgical History  Procedure Laterality Date  . Melanoma excision Left 01/23/2011    forearm  . Left heart catheterization with coronary angiogram N/A 11/17/2012    Procedure: LEFT HEART CATHETERIZATION WITH CORONARY ANGIOGRAM;  Surgeon: Burnell Blanks, MD;  Location: [facility] CATH LAB;  Service: Cardiovascular;  Laterality: N/A;  . Cataract extraction w/ intraocular lens implant Left 08/2009    Dr Katy Fitch  . Cardiac catheterization      Allergies:  Review of patient's allergies indicates no known allergies. Prior to Admit Meds:   Prescriptions prior to admission  Medication Sig Dispense Refill Last Dose  . aspirin EC 81 MG tablet Take 81 mg by mouth every morning.    02/21/2014 at Unknown time  . atorvastatin (LIPITOR) 20 MG tablet Take 1 tablet (20 mg total) by mouth daily.  30 tablet 3 02/21/2014 at Unknown time  . diazepam (VALIUM) 10 MG tablet Take 1 tablet (10 mg total) by mouth at bedtime as needed for anxiety. 30 tablet 0 Past Week at Unknown time  . diltiazem (CARDIZEM CD) 180 MG 24 hr capsule Take 1 capsule (180 mg total) by mouth daily. 30 capsule 2 02/21/2014 at Unknown time  . doxazosin (CARDURA) 2 MG tablet Take 1 tablet (2 mg total) by mouth daily. 30 tablet 2 02/21/2014 at Unknown time  . fish oil-omega-3 fatty acids 1000 MG capsule Take 2 g by mouth 2 (two) times daily.     02/21/2014 at Unknown time  . hydrochlorothiazide (HYDRODIURIL) 25 MG tablet Take 1 tablet (25 mg total) by mouth daily. 30 tablet 2 02/21/2014 at Unknown time    . HYDROcodone-acetaminophen (NORCO) 7.5-325 MG per tablet Take 1 tablet by mouth every 6 (six) hours as needed for moderate pain. 90 tablet 0 02/21/2014 at Unknown time  . LANTUS SOLOSTAR 100 UNIT/ML Solostar Pen INJECT 16 UNITS INTO THE SKIN AT BEDTIME (Patient taking differently: INJECT 12 UNITS INTO THE SKIN AT BEDTIME) 15 mL 2 02/20/2014 at Unknown time  . losartan (COZAAR) 100 MG tablet TAKE ONE TABLET BY MOUTH ONCE DAILY IN THE MORNING 30 tablet 5 02/21/2014 at Unknown time  . metFORMIN (GLUCOPHAGE) 500 MG tablet TAKE TWO TABLETS BY MOUTH TWICE DAILY WITH MEALS 120 tablet 1 02/21/2014 at Unknown time  . nitroGLYCERIN (NITROSTAT) 0.4 MG SL tablet Place 1 tablet (0.4 mg total) under the tongue every 5 (five) minutes x 3 doses as needed for chest pain (donot take with viagra). 60 tablet 12 02/21/2014 at Unknown time  . PARoxetine (PAXIL) 30 MG tablet TAKE ONE TABLET BY MOUTH EVERY MORNING 30 tablet 1 02/21/2014 at Unknown time  . pioglitazone (ACTOS) 30 MG tablet TAKE ONE TABLET BY MOUTH ONCE DAILY 30 tablet 3 02/21/2014 at Unknown time  . fluticasone (FLONASE) 50 MCG/ACT nasal spray Place 2 sprays into both nostrils daily. (Patient not taking: Reported on 02/21/2014) 16 g 6 Not Taking at Unknown time   Fam HX:    Family History  Problem Relation Age of Onset  . Arthritis Mother   . Heart disease Sister     Massive MI age 88.  . Arrhythmia Brother   . Melanoma Son   . Heart attack Brother    Social HX:    History   Social History  . Marital Status: Divorced    Spouse Name: N/A  . Number of Children: 3  . Years of Education: N/A   Occupational History  . RETIRED BUS DRIVER    Social History Main Topics  . Smoking status: Former Smoker -- 1.00 packs/day for 25 years    Types: Cigarettes    Quit date: 01/05/1982  . Smokeless tobacco: Never Used  . Alcohol Use: Yes     Comment: "quit drinking in the 1980's"  . Drug Use: No  . Sexual Activity: Yes   Other Topics Concern  . 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     ROS:  All 11 ROS were addressed and are negative except what is stated in the HPI  Physical Exam: Blood pressure 115/63, pulse 51, temperature 97.4 F (36.3 C), temperature source Oral, resp. rate 20, height 6' (1.829 m), weight 275 lb 14.4 oz (125.147 kg), SpO2 95 %.  General: Well developed, well nourished, in no acute distress Head: Eyes PERRLA, No xanthomas.   Normal cephalic and atramatic  Lungs:   Clear bilaterally to auscultation and percussion. Heart:   HRRR S1 S2 Pulses are 2+ & equal.            No carotid bruit. No JVD.  No abdominal bruits. No femoral bruits. Abdomen: Bowel sounds are positive, abdomen soft and non-tender without masses  Extremities:   No clubbing, cyanosis or edema.  DP +1 Neuro: Alert and oriented X 3. Psych:  Good affect, responds appropriately    Labs:   Lab Results  Component Value Date   WBC 10.1 02/22/2014   HGB 12.3* 02/22/2014   HCT 38.0* 02/22/2014   MCV 88.4 02/22/2014   PLT 235 02/22/2014    Recent Labs Lab 02/21/14 1220  NA 134*  K 4.1  CL 102  CO2 28  BUN 27*  CREATININE 1.16  CALCIUM 9.3  GLUCOSE 115*   No results found for: PTT Lab Results  Component Value Date   INR 1.1* 11/15/2012   INR 1.06 01/04/2012   Lab Results  Component Value Date   TROPONINI <0.03 02/22/2014     Lab Results  Component Value Date   CHOL 156 07/17/2013   CHOL 213* 04/17/2013   CHOL 257* 09/30/2012   Lab Results  Component Value Date   HDL 42 07/17/2013   HDL 35* 04/17/2013   HDL 37* 09/30/2012   Lab Results  Component Value Date   LDLCALC 79 07/17/2013   LDLCALC 109* 04/17/2013   [place]  09/30/2012     Comment:       Not calculated due to Triglyceride >400. Suggest ordering Direct LDL (Unit Code: [phone]).   Total Cholesterol/HDL Ratio:CHD Risk                        Coronary Heart Disease Risk  Table                                        Men       Women          1/2 Average Risk              3.4        3.3              Average Risk              5.0        4.4           2X Average Risk              9.6        7.1           3X Average Risk             23.4       11.0 Use the calculated Patient Ratio above and the CHD Risk table  to determine the patient's CHD Risk. ATP III Classification (LDL):       < 100        mg/dL         Optimal      100 - 129     mg/dL         Near or Above Optimal      130 - 159  mg/dL         Borderline High      160 - 189     mg/dL         High       > 190        mg/dL         Very High     Lab Results  Component Value Date   TRIG 173* 07/17/2013   TRIG 346* 04/17/2013   TRIG 477* 09/30/2012   Lab Results  Component Value Date   CHOLHDL 3.7 07/17/2013   CHOLHDL 6.1 04/17/2013   CHOLHDL 6.9 09/30/2012   No results found for: LDLDIRECT    Radiology:  Dg Chest 2 View  02/21/2014   CLINICAL DATA:  Chest pain  EXAM: CHEST  2 VIEW  COMPARISON:  04/12/2013  FINDINGS: There is borderline cardiomegaly which is stable from prior. Aortic hilar contours are also unchanged. Mild hyperinflation. There is no edema, consolidation, effusion, or pneumothorax.  IMPRESSION: 1. No active cardiopulmonary disease. 2. Chronic hyperinflation.   Electronically Signed   By: Monte Fantasia M.D.   On: 02/21/2014 13:03    EKG:  Sinus bradycardia with nonspecific T wave abnormality  ASSESSMENT/PLAN:  1.  Chest pain with nonspecific T wave abnormality and normal Troponin x 4. His symptoms are concerning for angina and occur several times weekly.  He is vague in his description.  He apparently [place] in the 90[place].   He has multiple cardiac risk factors including HTN, DM, dyslipidemia, family history of CAD and remote tobacco use.  I will set him up for a Lexiscan myoview to rule out ischemia.  He has eaten this am so will plan for tomorrow.  He is  obese so I will get a 2 day study with rest images today.  2.  Asymptomatic bradycardia 3.  Type II DM - per IM 4.  HTN - controlled on ARB/doxazosin 5.  Dyslipidemia.  Continue statin.     Sueanne Margarita, MD  02/22/2014  8:10 AM

## 2014-02-22 NOTE — Care Management Note (Signed)
    Page 1 of 1   02/23/2014     3:31:06 PM CARE MANAGEMENT NOTE 02/23/2014  Patient:  Christian Morris, Christian Morris   Account Number:  [id number]  Date Initiated:  02/22/2014  Documentation initiated by:  GRAVES-BIGELOW,Lania Zawistowski  Subjective/Objective Assessment:   Pt admitted for chest pain- 2 day stress test. Pt is from home alone.     Action/Plan:   Pt states he needs assistance with CPAP. The machine he has is old per liaison from [facility]. CM to monitor on Friday. Pt will benefit from [facility] once stable.   Anticipated DC Date:  02/24/2014   Anticipated DC Plan:  [place]  CM consult  Patient refused services      Choice offered to / List presented to:             Status of service:  Completed, signed off Medicare Important Message given?  YES (If response is "NO", the following Medicare IM given date fields will be blank) Date Medicare IM given:  02/23/2014 Medicare IM given by:  Whitman Hero Date Additional Medicare IM given:   Additional Medicare IM given by:    Discharge Disposition:  HOME/SELF CARE  Per UR Regulation:  Reviewed for med. necessity/level of care/duration of stay  If discussed at [place] of Stay Meetings, dates discussed:    Comments:  02-23-14 1519 Jacqlyn Krauss, RN,BSN [phone] CM will fax sleep study orders to the Sleep Disorder Service. Pt will wait on CPAP until sleep study is done. NO further needs from CM at this time.   02/22/2014 @ 10:00 Whitman Hero RN,BSN Spoke with pt regarding concerns about CPAP. Last sleep study done per pt 6-7 years ago. Requesting info about CPAP rentals. Referral made with [facility], [place] to  f/u with pt. Discussed HHRN, pt felt not  needed @ this time. Meals on Wheels offered and info provided, pt stated wll f/u with them if needed.   Reason for consult->Cardiac disease Diagnoses of->Diabetes [facility] consult placed. Jacqlyn Krauss, RN,BSN [phone]

## 2014-02-22 NOTE — Consult Note (Signed)
Met with the patient at bedside to offer [facility] Care Management services as benefit of [employer] insurance. Patient agreeable to services and will receive post hospital discharge call and will be evaluated for monthly home visits. Consent form signed and folder with [facility] Care [facility] information given. Of note, [facility] Care Management services does not replace or interfere with any services that are arranged by inpatient case management or social work. For additional questions or referrals please contact,  , RN BSN CCM, [facility], Care Management Hospital Liaison at [phone]. 

## 2014-02-22 NOTE — Progress Notes (Signed)
[place] for heparin Indication: chest pain/ACS  No Known Allergies  Patient Measurements: Height: 6' (182.9 cm) Weight: 275 lb 14.4 oz (125.147 kg) IBW/kg (Calculated) : 77.6 Heparin Dosing Weight: 105kg  Vital Signs: Temp: 97.7 F (36.5 C) (02/18 0828) Temp Source: Oral (02/18 0828) BP: 112/55 mmHg (02/18 0828) Pulse Rate: 56 (02/18 0828)  Labs:  Recent Labs  02/21/14 1220 02/21/14 1941 02/21/14 2109 02/22/14 0034 02/22/14 0911  HGB 13.5  --   --  12.3*  --   HCT 41.8  --   --  38.0*  --   PLT 271  --   --  235  --   HEPARINUNFRC  --   --   --  0.11* 0.31  CREATININE 1.16  --   --   --   --   TROPONINI <0.03 <0.03 <0.03 <0.03  --     Estimated Creatinine Clearance: 75.2 mL/min (by C-G formula based on Cr of 1.16).   Medical History: Past Medical History  Diagnosis Date  . Hypertension   . Hyperlipidemia   . History of gastric ulcer ~ 1958    no recurrence since.  . Hypogonadism male 01/11/2011  . Lung nodule     right 16 mm, seen initially 6/12. 84mm in 08/2011.  [name] GERD (gastroesophageal reflux disease)   . Fatty liver disease, nonalcoholic   . Kidney stones   . Squamous cell cancer of skin of forearm 01/23/2011    left  . Depression   . Type II diabetes mellitus   . History of blood transfusion ~ 1958    "related to bleeding ulcers"  . Daily headache     "here lately" (02/21/2014)  . Arthritis     "legs" (02/21/2014)  . Chronic lower back pain   . Anxiety   . OSA on CPAP   . Urinary hesitancy     74 yo male here with CP to start heparin for r/o ACS. Heparin is at low end of goal (0.3) on 1600 units/hr, no bleeding noted, cbc stable although hgb down from yesterday. Will increase rate to keep within goal. Current plan is for 2 day myoview.  Goal of Therapy:  Heparin level 0.3-0.7 units/ml Monitor platelets by anticoagulation protocol: Yes   Plan:  -Increase heparin to 1750 units/hr -Heparin daily wth CBC    Erin Hearing PharmD., BCPS Clinical Pharmacist Pager [phone] 02/22/2014 11:13 AM

## 2014-02-22 NOTE — Progress Notes (Signed)
ANTICOAGULATION CONSULT NOTE - Follow Up Consult  Pharmacy Consult for heparin Indication: chest pain/ACS  Labs:  Recent Labs  02/21/14 1220 02/21/14 1941 02/21/14 2109 02/22/14 0034  HGB 13.5  --   --  12.3*  HCT 41.8  --   --  38.0*  PLT 271  --   --  235  HEPARINUNFRC  --   --   --  0.11*  CREATININE 1.16  --   --   --   TROPONINI <0.03 <0.03 <0.03  --     Assessment: 73yo male subtherapeutic on heparin with initial dosing for CP.  Goal of Therapy:  Heparin level 0.3-0.7 units/ml   Plan:  Will rebolus with heparin 3000 units and increase gtt by 3 units/kg/hr to 1600 units/hr and check level in [place], PharmD, BCPS  02/22/2014,1:15 AM

## 2014-02-23 ENCOUNTER — Ambulatory Visit (HOSPITAL_COMMUNITY): Payer: PPO

## 2014-02-23 DIAGNOSIS — R072 Precordial pain: Secondary | ICD-10-CM | POA: Diagnosis not present

## 2014-02-23 DIAGNOSIS — R079 Chest pain, unspecified: Secondary | ICD-10-CM

## 2014-02-23 DIAGNOSIS — E785 Hyperlipidemia, unspecified: Secondary | ICD-10-CM

## 2014-02-23 DIAGNOSIS — G4733 Obstructive sleep apnea (adult) (pediatric): Secondary | ICD-10-CM | POA: Diagnosis not present

## 2014-02-23 LAB — CBC
HEMATOCRIT: 37.7 % — AB (ref 39.0–52.0)
HEMOGLOBIN: 12.3 g/dL — AB (ref 13.0–17.0)
MCH: 28.1 pg (ref 26.0–34.0)
MCHC: 32.6 g/dL (ref 30.0–36.0)
MCV: 86.3 fL (ref 78.0–100.0)
Platelets: 227 10*3/uL (ref 150–400)
RBC: 4.37 MIL/uL (ref 4.22–5.81)
RDW: 13.8 % (ref 11.5–15.5)
WBC: 8.3 10*3/uL (ref 4.0–10.5)

## 2014-02-23 LAB — HEMOGLOBIN A1C
Hgb A1c MFr Bld: 6.5 % — ABNORMAL HIGH (ref 4.8–5.6)
MEAN PLASMA GLUCOSE: 140 mg/dL

## 2014-02-23 LAB — GLUCOSE, CAPILLARY
GLUCOSE-CAPILLARY: 146 mg/dL — AB (ref 70–99)
Glucose-Capillary: 159 mg/dL — ABNORMAL HIGH (ref 70–99)

## 2014-02-23 LAB — HEPARIN LEVEL (UNFRACTIONATED): HEPARIN UNFRACTIONATED: 0.41 [IU]/mL (ref 0.30–0.70)

## 2014-02-23 MED ORDER — REGADENOSON 0.4 MG/5ML IV SOLN
0.4000 mg | Freq: Once | INTRAVENOUS | Status: DC
  Filled 2014-02-23: qty 5

## 2014-02-23 MED ORDER — TAMSULOSIN HCL 0.4 MG PO CAPS: 0.4000 mg | ORAL_CAPSULE | Freq: Every day | ORAL | Status: DC

## 2014-02-23 MED ORDER — INSULIN GLARGINE 100 UNIT/ML SOLOSTAR PEN: 10.0000 [IU] | PEN_INJECTOR | Freq: Every day | SUBCUTANEOUS | Status: DC

## 2014-02-23 MED ORDER — PAROXETINE HCL 20 MG PO TABS
30.0000 mg | ORAL_TABLET | Freq: Every day | ORAL | Status: DC
  Filled 2014-02-23: qty 1.5

## 2014-02-23 MED ORDER — REGADENOSON 0.4 MG/5ML IV SOLN
INTRAVENOUS | Status: AC
Start: 2014-02-23 — End: 2014-02-23
  Administered 2014-02-23: 0.4 mg via INTRAVENOUS
  Filled 2014-02-23: qty 5

## 2014-02-23 MED ORDER — LOSARTAN POTASSIUM 25 MG PO TABS: 25.0000 mg | ORAL_TABLET | Freq: Every day | ORAL | Status: DC

## 2014-02-23 MED ORDER — TECHNETIUM TC 99M SESTAMIBI GENERIC - CARDIOLITE
30.0000 | Freq: Once | INTRAVENOUS | Status: AC | PRN
  Administered 2014-02-23: 30 via INTRAVENOUS

## 2014-02-23 MED ORDER — ATORVASTATIN CALCIUM 20 MG PO TABS: 40.0000 mg | ORAL_TABLET | Freq: Every day | ORAL | Status: DC

## 2014-02-23 NOTE — Progress Notes (Signed)
ANTICOAGULATION CONSULT NOTE  Pharmacy Consult for heparin Indication: chest pain/ACS  No Known Allergies  Patient Measurements: Height: 6' (182.9 cm) Weight: 274 lb 9.6 oz (124.558 kg) IBW/kg (Calculated) : 77.6 Heparin Dosing Weight: 105kg  Vital Signs: Temp: 97.9 F (36.6 C) (02/19 0400) Temp Source: Oral (02/19 0400) BP: 117/64 mmHg (02/19 1107) Pulse Rate: 74 (02/19 0400)  Labs:  Recent Labs  02/21/14 1220 02/21/14 1941 02/21/14 2109 02/22/14 0034 02/22/14 0911 02/23/14 0701  HGB 13.5  --   --  12.3*  --  12.3*  HCT 41.8  --   --  38.0*  --  37.7*  PLT 271  --   --  235  --  227  HEPARINUNFRC  --   --   --  0.11* 0.31 0.41  CREATININE 1.16  --   --   --   --   --   TROPONINI <0.03 <0.03 <0.03 <0.03  --   --     Estimated Creatinine Clearance: 75 mL/min (by C-G formula based on Cr of 1.16).   Medical History: Past Medical History  Diagnosis Date  . Hypertension   . Hyperlipidemia   . History of gastric ulcer ~ 1958    no recurrence since.  . Hypogonadism male 01/11/2011  . Lung nodule     right 16 mm, seen initially 6/12. 5mm in 08/2011.  [name] GERD (gastroesophageal reflux disease)   . Fatty liver disease, nonalcoholic   . Kidney stones   . Squamous cell cancer of skin of forearm 01/23/2011    left  . Depression   . Type II diabetes mellitus   . History of blood transfusion ~ 1958    "related to bleeding ulcers"  . Daily headache     "here lately" (02/21/2014)  . Arthritis     "legs" (02/21/2014)  . Chronic lower back pain   . Anxiety   . OSA on CPAP   . Urinary hesitancy     73 yo male here with CP to start heparin for r/o ACS. Heparin is at goal (0.5) on 1750 units/hr, no bleeding noted, cbc stable.   Today is day 2/2 of stress test, if normal will d/c home later today.  Goal of Therapy:  Heparin level 0.3-0.7 units/ml Monitor platelets by anticoagulation protocol: Yes   Plan:  -heparin at 1750 units/hr -Heparin daily wth CBC   Erin Hearing PharmD., BCPS Clinical Pharmacist Pager [phone] 02/23/2014 11:20 AM

## 2014-02-23 NOTE — Progress Notes (Signed)
Pt discharged to home per MD order. Pt received and reviewed all discharge instructions and medication information including follow-up appointments and prescription information. Pt verbalized understanding. Pt alert and oriented at discharge with no complaints of pain. Pt IV and telemetry box removed prior to discharge. Pt escorted to private vehicle via wheelchair by nurse tech. Jeriann Sayres C  

## 2014-02-23 NOTE — Progress Notes (Signed)
Patient Name: Christian Morris Date of Encounter: 02/23/2014  Principal Problem:   Chest pain Active Problems:   Diabetes mellitus with hypoglycemia   Hyperlipidemia   OBESITY, MORBID   OSA on CPAP   Essential hypertension   COPD (chronic obstructive pulmonary disease)   Hepatic steatosis   SUBJECTIVE:   Denies any CP or SOB overnight  CURRENT MEDS:   . aspirin EC  81 mg Oral q morning - 10a  . atorvastatin  20 mg Oral Daily  . doxazosin  2 mg Oral Daily  . insulin aspart  0-5 Units Subcutaneous QHS  . insulin aspart  0-9 Units Subcutaneous TID WC  . insulin glargine  10 Units Subcutaneous Q2200  . losartan  100 mg Oral Daily  . omega-3 acid ethyl esters  2 g Oral BID  . PARoxetine  30 mg Oral Daily  . pioglitazone  30 mg Oral Daily  . regadenoson  0.4 mg Intravenous Once  . regadenoson  0.4 mg Intravenous Once   OBJECTIVE:   Filed Vitals:   02/22/14 1601 02/22/14 2205 02/23/14 0000 02/23/14 0400  BP: 106/56  96/63 118/55  Pulse: 64 66 58 74  Temp: 97.3 F (36.3 C)  97.7 F (36.5 C) 97.9 F (36.6 C)  TempSrc: Oral  Oral Oral  Resp: 20 20    Height:    6' (1.829 m)  Weight:    274 lb 9.6 oz (124.558 kg)  SpO2: 99% 98% 98% 93%    Intake/Output Summary (Last 24 hours) at 02/23/14 1008 Last data filed at 02/23/14 0744  Gross per 24 hour  Intake 647.35 ml  Output    925 ml  Net -277.65 ml   Filed Weights   02/21/14 1709 02/22/14 0400 02/23/14 0400  Weight: 273 lb 9.5 oz (124.1 kg) 275 lb 14.4 oz (125.147 kg) 274 lb 9.6 oz (124.558 kg)    PHYSICAL EXAM  General: Pleasant, NAD. Neuro: Alert and oriented X 3. Moves all extremities spontaneously. Psych: Normal affect. HEENT:  Normal  Neck: Supple without bruits or JVD. Lungs:  Resp regular and unlabored, CTA. Heart: RRR no s3, s4, or murmurs. Abdomen: Soft, non-tender, non-distended, BS + x 4.  Extremities: No clubbing, cyanosis or edema. DP/PT/Radials 2+ and equal bilaterally.  Accessory Clinical  Findings  CBC  Recent Labs  02/21/14 1220 02/22/14 0034 02/23/14 0701  WBC 9.9 10.1 8.3  NEUTROABS 5.9  --   --   HGB 13.5 12.3* 12.3*  HCT 41.8 38.0* 37.7*  MCV 87.8 88.4 86.3  PLT 271 235 161   Basic Metabolic Panel  Recent Labs  02/21/14 1220  NA 134*  K 4.1  CL 102  CO2 28  GLUCOSE 115*  BUN 27*  CREATININE 1.16  CALCIUM 9.3   Cardiac Enzymes  Recent Labs  02/21/14 1941 02/21/14 2109 02/22/14 0034  TROPONINI <0.03 <0.03 <0.03   Hemoglobin A1C  Recent Labs  02/22/14 1238  HGBA1C 6.5*   Fasting Lipid Panel  Recent Labs  02/22/14 1238  CHOL 249*  HDL 31*  LDLCALC 144*  TRIG 372*  CHOLHDL 8.0   Thyroid Function Tests  Recent Labs  02/22/14 1238  TSH 0.550    TELE NSR with HR 60-70s    ECG  No new EKG  Echocardiogram 12/11/2012  LV EF: 60% -  65%  ------------------------------------------------------------ Indications:   CHF - 428.0.  ------------------------------------------------------------ History:  PMH:  Chronic obstructive pulmonary disease. Risk factors: Anxiety. OSA. GERD. H/o PUD. BPH.  PNA. Osteoarthritis. Low back pain. Plantar fasciitis. Squamous cell carcinoma. Renal insufficiency. Former tobacco use. Diabetes mellitus. Morbidly obese. Dyslipidemia.  ------------------------------------------------------------ Study Conclusions  - Left ventricle: The cavity size was normal. Wall thickness was increased in a pattern of moderate LVH. Systolic function was normal. The estimated ejection fraction was in the range of 60% to 65%. Wall motion was normal; there were no regional wall motion abnormalities. - Aortic valve: Valve area: 2.28cm^2(VTI). Valve area: 1.84cm^2 (Vmax).    Radiology/Studies  Dg Chest 2 View  02/21/2014   CLINICAL DATA:  Chest pain  EXAM: CHEST  2 VIEW  COMPARISON:  04/12/2013  FINDINGS: There is borderline cardiomegaly which is stable from prior. Aortic hilar contours are  also unchanged. Mild hyperinflation. There is no edema, consolidation, effusion, or pneumothorax.  IMPRESSION: 1. No active cardiopulmonary disease. 2. Chronic hyperinflation.   Electronically Signed   By: Monte Fantasia M.D.   On: 02/21/2014 13:03    ASSESSMENT AND PLAN  1.Chest pain with nonspecific T wave abnormality and normal Troponin x 4.   - pending part 2 of 2 day lexiscan stress test today  2.Asymptomatic bradycardia 3.Type II DM - per IM 4.HTN - controlled on ARB/doxazosin 5.Dyslipidemia. Continue statin. 6. OSA on CPAP  Signed, Woodward Ku Pager: [phone]   The patient was seen, examined and discussed with Almyra Deforest, PA-C and I agree with the above.   The patient is awaiting part two of his stress test, he is currently asymptomatic. If normal we will discharge home.  He has significantly elevated LDL and TAG, I would recommend to increase atorvastatin to 40 mg po daily, if no response, add fenofibrate 160 mg po daily.   Dorothy Spark 02/23/2014

## 2014-02-23 NOTE — Discharge Summary (Addendum)
Discharge Summary  Christian Morris ZOX:[id number] DOB: October 02, 1940  PCP: Nance Pear., NP  Admit date: 02/21/2014 Discharge date: 02/23/2014  Time spent: >34mins  Recommendations for Outpatient Follow-up:  1. F/u with pmd, pmd to continue adjust diabetic meds to avoid hypoglycemic episode, pmd to continue adjust bp meds. 2. F/u with cardiology 3. Recommended home health, but patient refused. He is followed by [facility]  Discharge Diagnoses:  Active Hospital Problems   Diagnosis Date Noted  . Chest pain 02/21/2014  . Hepatic steatosis 08/16/2009  . COPD (chronic obstructive pulmonary disease) 08/16/2009  . Essential hypertension 08/06/2009  . OSA on CPAP 08/06/2009  . Diabetes mellitus with hypoglycemia 08/06/2009  . Hyperlipidemia 08/06/2009  . OBESITY, MORBID 08/06/2009    Resolved Hospital Problems   Diagnosis Date Noted Date Resolved  No resolved problems to display.    Discharge Condition: stable  Diet recommendation: heart healthy/carb modified  Filed Weights   02/21/14 1709 02/22/14 0400 02/23/14 0400  Weight: 124.1 kg (273 lb 9.5 oz) 125.147 kg (275 lb 14.4 oz) 124.558 kg (274 lb 9.6 oz)    History of present illness:  Christian Morris is a 74 y.o. male, with past medical history of insulin dependent diabetes, obesity, hypertension, dyslipidemia and sleep apnea noncompliant with CPAP. He recently telephoned his 28 office on 2/16 with issues regarding symptoms that sounded suspicious for hypoglycemia. He was informed to decrease his long-acting insulin from 16 units to 12 units which he did. Overnight into 2/17 patient was awakened at 4:30 AM from sleep with left anterior chest pain that did not radiate that was pressure-like and had no associated symptoms. He reports the pain lasted about 30 minutes or more. He endorsed that he must been very tired because he fell back to sleep and when he awakened the chest pain had resolved. He had previously been scheduled to  follow-up with his primary care physician on that same morning and upon evaluation and discussion regarding the chest pain he experienced early in the morning he was sent to [facility] for further evaluation. Patient reported that CBG today was 40.  Evaluation at [facility] revealed an initial troponin of less than 0.03, glucose was 115. EKG revealed sinus bradycardia with a heart rate of 48. No definitive ischemic changes and QTC 391 ms. Sodium was mildly decreased at 134 and BUN was elevated at 27 which appears to be typical for this patient. Chest x-ray was unremarkable. He was subsequently sent to [facility] as a direct admission.  Hospital Course:  Principal Problem:   Chest pain Active Problems:   Diabetes mellitus with hypoglycemia   Hyperlipidemia   OBESITY, MORBID   OSA on CPAP   Essential hypertension   COPD (chronic obstructive pulmonary disease)   Hepatic steatosis  Chest pain  -Admited to telemetry unit --Heart score calculated at 6, he was started on heparin drip on admission due to concerning for angina -patient reported has been getting daily exercise at the [facility] for the last three years, but report getting progressive weak,  -No beta blockerm, no ccb due to bradycardia and low normal blood pressure - statin dose increased  -Unsure if recent hypoglycemia triggering factor for chest pain symptoms -negative cardiac enzymes -Last echocardiogram was in 2014 revealed preserved LV function;  -cardiology consulted, patient had 2day stress test which is unremarkable, cleared for discharge and outpatient cardiology follow up.   Diabetes mellitus with hypoglycemia -According to documentation from primary care physician's office patient  presented on 2/17 to discuss possible hypoglycemic events noting he awakens every morning between 2 and 3 AM feeling jittery. -- Lantus dose reduced to 10 units  qhs at discharge, pioglitazone stopped. Metformin resumed at  discharge -a1c 6.5   OSA on CPAP/morbid obesity -Patient endorses has been noncompliant at home -care manager to help with home cpap   Essential hypertension -low normal off home bp meds -d/c cardizem due to bradycardia/ -decrease cozaar dose to 25mg  po qd ( was on 100mg  po qd) -continue HCTZ, does report intermittent lower extremity edema, though no edema observed in the hospital -d/c cardura   COPD  -Currently asymptomatic and without wheezing, quit smoking in 1980's   Hyperlipidemia -Continue statin   Hepatic steatosis -Previous LFTs have been normal  Bph: d/c cardura, change to flomax to avoid hypotension side effect.  Obesity: life style changes  Code Status: full   Disposition Plan: home    Consultants:  cardiology  Procedures:  2 day Stress test  Antibiotics:  none  Discharge Exam: BP 100/48 mmHg  Pulse 90  Temp(Src) 97.6 F (36.4 C) (Oral)  Resp 20  Ht 6' (1.829 m)  Wt 124.558 kg (274 lb 9.6 oz)  BMI 37.23 kg/m2  SpO2 94%  General: In no acute distress, appears healthy and well nourished/obese-currently denies chest pain or pressure  Psych: Normal affect, Denies Suicidal or Homicidal ideations, Awake Alert, Oriented X 3. Speech and thought patterns are clear and appropriate, no apparent short term memory deficits  Neuro: No focal neurological deficits, CN II through XII intact, Strength 5/5 all 4 extremities, Sensation intact all 4 extremities.  ENT: Ears and Eyes appear Normal, Conjunctivae clear, PER. Moist oral mucosa without erythema or exudates.  Neck: Supple, No lymphadenopathy appreciated  Respiratory: Symmetrical chest wall movement, Good air movement bilaterally, CTAB. Room Air  Cardiac: RRR, No Murmurs, no LE edema noted, no JVD, No carotid bruits, peripheral pulses palpable at 2+  Abdomen: Positive bowel sounds, Soft, Non tender, Non distended, No masses appreciated, no obvious hepatosplenomegaly difficult to  ascertain given patient's large abdominal girth  Skin: No Cyanosis, Normal Skin Turgor, No Skin Rash or Bruise.  Extremities: Symmetrical without obvious trauma or injury, no effusions.   Discharge Instructions You were cared for by a hospitalist during your hospital stay. If you have any questions about your discharge medications or the care you received while you were in the hospital after you are discharged, you can call the unit and asked to speak with the hospitalist on call if the hospitalist that took care of you is not available. Once you are discharged, your primary care physician will handle any further medical issues. Please note that NO REFILLS for any discharge medications will be authorized once you are discharged, as it is imperative that you return to your primary care physician (or establish a relationship with a primary care physician if you do not have one) for your aftercare needs so that they can reassess your need for medications and monitor your lab values.      Discharge Instructions    Diet - low sodium heart healthy    Complete by:  As directed      Increase activity slowly    Complete by:  As directed             Medication List    STOP taking these medications        diltiazem 180 MG 24 hr capsule  Commonly known as:  CARDIZEM  CD     doxazosin 2 MG tablet  Commonly known as:  CARDURA     pioglitazone 30 MG tablet  Commonly known as:  ACTOS      TAKE these medications        aspirin EC 81 MG tablet  Take 81 mg by mouth every morning.     atorvastatin 20 MG tablet  Commonly known as:  LIPITOR  Take 2 tablets (40 mg total) by mouth daily.     diazepam 10 MG tablet  Commonly known as:  VALIUM  Take 1 tablet (10 mg total) by mouth at bedtime as needed for anxiety.     fish oil-omega-3 fatty acids 1000 MG capsule  Take 2 g by mouth 2 (two) times daily.     fluticasone 50 MCG/ACT nasal spray  Commonly known as:  FLONASE  Place 2 sprays into  both nostrils daily.     hydrochlorothiazide 25 MG tablet  Commonly known as:  HYDRODIURIL  Take 1 tablet (25 mg total) by mouth daily.     HYDROcodone-acetaminophen 7.5-325 MG per tablet  Commonly known as:  NORCO  Take 1 tablet by mouth every 6 (six) hours as needed for moderate pain.     Insulin Glargine 100 UNIT/ML Solostar Pen  Commonly known as:  LANTUS SOLOSTAR  Inject 10 Units into the skin daily at 10 pm.     losartan 25 MG tablet  Commonly known as:  COZAAR  Take 1 tablet (25 mg total) by mouth daily.     metFORMIN 500 MG tablet  Commonly known as:  GLUCOPHAGE  TAKE TWO TABLETS BY MOUTH TWICE DAILY WITH MEALS     nitroGLYCERIN 0.4 MG SL tablet  Commonly known as:  NITROSTAT  Place 1 tablet (0.4 mg total) under the tongue every 5 (five) minutes x 3 doses as needed for chest pain (donot take with viagra).     PARoxetine 30 MG tablet  Commonly known as:  PAXIL  TAKE ONE TABLET BY MOUTH EVERY MORNING     tamsulosin 0.4 MG Caps capsule  Commonly known as:  FLOMAX  Take 1 capsule (0.4 mg total) by mouth daily.       No Known Allergies Follow-up Information    Follow up with Sueanne Margarita, MD.   Specialty:  Cardiology   Why:  Office will contact you for followup, please give [place] a call if you do not hear from [place] in 2 business days   Contact information:   [address] [place] [address] [phone]       Follow up with Nance Pear., NP In 1 week.   Specialty:  Internal Medicine   Contact information:   [place] [address] [phone]        The results of significant diagnostics from this hospitalization (including imaging, microbiology, ancillary and laboratory) are listed below for reference.    Significant Diagnostic Studies: Dg Chest 2 View  02/21/2014   CLINICAL DATA:  Chest pain  EXAM: CHEST  2 VIEW  COMPARISON:  04/12/2013  FINDINGS: There is borderline cardiomegaly which is stable from  prior. Aortic hilar contours are also unchanged. Mild hyperinflation. There is no edema, consolidation, effusion, or pneumothorax.  IMPRESSION: 1. No active cardiopulmonary disease. 2. Chronic hyperinflation.   Electronically Signed   By: Monte Fantasia M.D.   On: 02/21/2014 13:03   Nm Myocar Multi W/spect W/wall Motion / Ef  02/23/2014  CLINICAL DATA:  Chest pain. History of diabetes and hyperlipidemia. Initial encounter.  EXAM: MYOCARDIAL IMAGING WITH SPECT (REST AND PHARMACOLOGIC-STRESS - 2 DAY PROTOCOL)  GATED LEFT VENTRICULAR WALL MOTION STUDY  LEFT VENTRICULAR EJECTION FRACTION  TECHNIQUE: Standard myocardial SPECT imaging was performed after resting intravenous injection of 30 mCi Tc-51m sestamibi. Subsequently, on a second day, intravenous infusion of Lexiscan was performed under the supervision of the Cardiology staff. At peak effect of the drug, 30 mCi Tc-64m sestamibi was injected intravenously and standard myocardial SPECT imaging was performed. Quantitative gated imaging was also performed to evaluate left ventricular wall motion, and estimate left ventricular ejection fraction.  COMPARISON:  Radiographs 02/21/2014.  Abdominal CT 01/07/2013.  FINDINGS: Perfusion: No decreased activity in the left ventricle on stress imaging to suggest reversible ischemia or infarction. Mildly decreased activity at the apex and inferior wall appears fixed, most consistent with apical thinning and diaphragmatic attenuation, respectively. There is no associated wall motion abnormality in these areas.  Wall Motion: Normal left ventricular wall motion. No left ventricular dilation.  Left Ventricular Ejection Fraction: 65 %  End diastolic volume 202 ml  End systolic volume 48 ml  IMPRESSION: 1. No reversible ischemia or infarction.  2. Normal left ventricular wall motion.  3. Left ventricular ejection fraction 65%  4. Low-risk stress test findings*.  *2012 Appropriate Use Criteria for Coronary Revascularization Focused  Update: J Am Coll Cardiol. 3343;56(8):616-837. http://content.airportbarriers.com.aspx?articleid=[email or web address]   Electronically Signed   By: Richardean Sale M.D.   On: 02/23/2014 16:19    Microbiology: No results found for this or any previous visit (from the past 240 hour(s)).   Labs: Basic Metabolic Panel:  Recent Labs Lab 02/21/14 1220  NA 134*  K 4.1  CL 102  CO2 28  GLUCOSE 115*  BUN 27*  CREATININE 1.16  CALCIUM 9.3   Liver Function Tests: No results for input(s): AST, ALT, ALKPHOS, BILITOT, PROT, ALBUMIN in the last 168 hours. No results for input(s): LIPASE, AMYLASE in the last 168 hours. No results for input(s): AMMONIA in the last 168 hours. CBC:  Recent Labs Lab 02/21/14 1220 02/22/14 0034 02/23/14 0701  WBC 9.9 10.1 8.3  NEUTROABS 5.9  --   --   HGB 13.5 12.3* 12.3*  HCT 41.8 38.0* 37.7*  MCV 87.8 88.4 86.3  PLT 271 235 227   Cardiac Enzymes:  Recent Labs Lab 02/21/14 1220 02/21/14 1941 02/21/14 2109 02/22/14 0034  TROPONINI <0.03 <0.03 <0.03 <0.03   BNP: BNP (last 3 results) No results for input(s): BNP in the last 8760 hours.  ProBNP (last 3 results) No results for input(s): PROBNP in the last 8760 hours.  CBG:  Recent Labs Lab 02/22/14 1144 02/22/14 1602 02/22/14 2059 02/23/14 0803 02/23/14 1337  GLUCAP 108* 112* 122* 146* 159*       Signed:  Zachrey Deutscher  [facility] 02/23/2014, 4:40 PM

## 2014-02-24 ENCOUNTER — Telehealth: Payer: Self-pay | Admitting: Family

## 2014-02-24 NOTE — Telephone Encounter (Signed)
Please contact pt to arrange a hospital follow up.

## 2014-02-26 ENCOUNTER — Telehealth: Payer: Self-pay | Admitting: Family

## 2014-02-26 MED ORDER — GLUCOSE BLOOD VI STRP: ORAL_STRIP | Status: DC

## 2014-02-26 NOTE — Telephone Encounter (Signed)
Bring in all meds 2/25 please, will recheck at his visit.

## 2014-02-26 NOTE — Telephone Encounter (Signed)
Admit date: 02/21/2014 Discharge date: 02/23/2014  Reason for admission: Chest Pain  Recommendations for Outpatient Follow-up:  1. F/u with pmd, pmd to continue adjust diabetic meds to avoid hypoglycemic episode, pmd to continue adjust bp meds. 2. F/u with cardiology 3. Recommended home health, but patient refused. He is followed by [facility]  Transition Care Management Follow-up Telephone Call  How have you been since you were released from the hospital?  Pt states he's doing better.  No chest pain.  Pt was unable to take his blood sugar this morning due to not having any strips.  New Rx for test strips sent to patient's preferred pharmacy.      Do you understand why you were in the hospital? yes   Do you understand the discharge instrcutions? no  Items Reviewed:  Medications reviewed: yes, pt was encouraged to bring medications in with him during next appointment.  Allergies reviewed: yes  Dietary changes reviewed: yes, low sodium heart healthy   Referrals reviewed: yes, cardiology; Needs appointment scheduled--Dr. Radford Pax    Functional Questionnaire:   Activities of Daily Living (ADLs):   He states they are independent in the following: ambulation, bathing and hygiene, feeding, continence, grooming, toileting and dressing States they require assistance with the following: none   Any transportation issues/concerns?: no, pt states he drives himself   Any patient concerns? no   Confirmed importance and date/time of follow-up visits scheduled: yes   Confirmed with patient if condition begins to worsen call PCP or go to the ER: yes    Hospital follow up appointment scheduled with [name] on Thursday, 03/01/14 @ 10:30 am.

## 2014-02-26 NOTE — Telephone Encounter (Signed)
Caller name: Ramsay Relation to pt: Call back number: [phone] Pharmacy:  Reason for call:   Patient wants to know the name of the heart medication that he is on

## 2014-02-26 NOTE — Telephone Encounter (Signed)
Spoke with pt. He states that he has been taking Cartia XT 180mg  daily. States he was placed on this in [place] due to tachycardia.  Pt wants to know if he should get med refilled. Medication is not on current med list. Upon review of EPIC it appears this was stopped at recent hospital discharge. Pt also has doxazosin and losartan 100mg  and 25mg  strengths. Also has pravastatin at home. Advised pt he should throw out: Cartia XT and doxazosin as hospital placed him on flomax. Also advised pt to throw out pravastatin as he is supposed to be taking atorvastatin and pt confirmed that he has this Rx (atorvastatin). Instructed pt to throw out losartan 100mg  and keep 25mg  per hospital discharge note.  Advised pt to bring all medication bottles with him to next office visit on 03/01/14. Per discharge note pt declined home health referral as he is followed by [facility]? Should we get someone to go out to his home and verify meds?

## 2014-03-01 ENCOUNTER — Telehealth: Payer: Self-pay | Admitting: *Deleted

## 2014-03-01 ENCOUNTER — Encounter: Payer: Self-pay | Admitting: Family

## 2014-03-01 ENCOUNTER — Ambulatory Visit (INDEPENDENT_AMBULATORY_CARE_PROVIDER_SITE_OTHER): Payer: PPO | Admitting: Family

## 2014-03-01 VITALS — BP 118/70 | HR 55 | Temp 97.8°F | Resp 16 | Ht 72.0 in | Wt 279.6 lb

## 2014-03-01 DIAGNOSIS — E785 Hyperlipidemia, unspecified: Secondary | ICD-10-CM

## 2014-03-01 DIAGNOSIS — R079 Chest pain, unspecified: Secondary | ICD-10-CM

## 2014-03-01 DIAGNOSIS — G4733 Obstructive sleep apnea (adult) (pediatric): Secondary | ICD-10-CM

## 2014-03-01 DIAGNOSIS — M545 Low back pain: Secondary | ICD-10-CM

## 2014-03-01 DIAGNOSIS — E08649 Diabetes mellitus due to underlying condition with hypoglycemia without coma: Secondary | ICD-10-CM

## 2014-03-01 DIAGNOSIS — I10 Essential (primary) hypertension: Secondary | ICD-10-CM

## 2014-03-01 DIAGNOSIS — Z9989 Dependence on other enabling machines and devices: Secondary | ICD-10-CM

## 2014-03-01 LAB — BASIC METABOLIC PANEL
BUN: 24 mg/dL — ABNORMAL HIGH (ref 6–23)
CHLORIDE: 106 meq/L (ref 96–112)
CO2: 27 mEq/L (ref 19–32)
CREATININE: 1.23 mg/dL (ref 0.40–1.50)
Calcium: 9.5 mg/dL (ref 8.4–10.5)
GFR: 60.9 mL/min (ref >60.00)
Glucose, Bld: 131 mg/dL — ABNORMAL HIGH (ref 70–99)
POTASSIUM: 4.3 meq/L (ref 3.5–5.1)
Sodium: 140 mEq/L (ref 135–145)

## 2014-03-01 NOTE — Assessment & Plan Note (Signed)
Will repeat sleep study.

## 2014-03-01 NOTE — Assessment & Plan Note (Signed)
Lipids uncontrolled, however he was not taking statin. He will resume. We have asked [facility] nurse to come out to help make sure he is taking meds properly.

## 2014-03-01 NOTE — Telephone Encounter (Signed)
Per verbal from Provider, need to refer to [facility] for assistance with medication management and glucometer usage due to educational barriers. Will also need to fax mini-mental exam.

## 2014-03-01 NOTE — Assessment & Plan Note (Signed)
Offered pain management referral.  Pt declined. Advised pt to continue lortab. He does not wish to pursue any aggressive measures such as surgery.

## 2014-03-01 NOTE — Progress Notes (Signed)
Subjective:    Patient ID: Christian Morris, male    DOB: [birth year], 74 y.o.   MRN: [id number]  HPI  Christian Morris is a 74 yr old male who presents today for hospital follow.  Hospital records are review. Pt was seen in our office on 02/21/14 with chief complaint of chest pain. He was sent to the ED for further evaluation and was admitted 2/17-2/19.    OSA- He has not been contacted from home health.  Has not had a sleep study in years.   DM2- hx of hypoglycemia.  During his hospital stay lantus was further decreased to 10 units and actos was discontinued.  Metformin was resumed. He denies hypoglycemia since returning home.   Lab Results  Component Value Date   HGBA1C 6.5* 02/22/2014   Chest pain- troponin neg in ED.  Pt underwent stress test while hospitalized which was negative. He is to follow up with cardiology as an outpatient.  Denies cp or sob.    Hyperlipidemia- not clear that he was taking statin prior to admission.    Low back pain- reports that pain persists despite lortab. Declines pain management referral.   Review of Systems See HPI  Past Medical History  Diagnosis Date  . Hypertension   . Hyperlipidemia   . History of gastric ulcer ~ 1958    no recurrence since.  . Hypogonadism male 01/11/2011  . Lung nodule     right 16 mm, seen initially 6/12. 28mm in 08/2011.  [name] GERD (gastroesophageal reflux disease)   . Fatty liver disease, nonalcoholic   . Kidney stones   . Squamous cell cancer of skin of forearm 01/23/2011    left  . Depression   . Type II diabetes mellitus   . History of blood transfusion ~ 1958    "related to bleeding ulcers"  . Daily headache     "here lately" (02/21/2014)  . Arthritis     "legs" (02/21/2014)  . Chronic lower back pain   . Anxiety   . OSA on CPAP   . Urinary hesitancy     History   Social History  . Marital Status: Divorced    Spouse Name: N/A  . Number of Children: 3  . Years of Education: N/A   Occupational History  .  RETIRED BUS DRIVER    Social History Main Topics  . Smoking status: Former Smoker -- 1.00 packs/day for 25 years    Types: Cigarettes    Quit date: 01/05/1982  . Smokeless tobacco: Never Used  . Alcohol Use: Yes     Comment: "quit drinking in the 1980's"  . Drug Use: No  . Sexual Activity: Yes   Other Topics Concern  . 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    Past Surgical History  Procedure Laterality Date  . Melanoma excision Left 01/23/2011    forearm  . Left heart catheterization with coronary angiogram N/A 11/17/2012    Procedure: LEFT HEART CATHETERIZATION WITH CORONARY ANGIOGRAM;  Surgeon: Burnell Blanks, MD;  Location: [facility] CATH LAB;  Service: Cardiovascular;  Laterality: N/A;  . Cataract extraction w/ intraocular lens implant Left 08/2009    Dr Katy Fitch  . Cardiac catheterization      Family History  Problem Relation Age of Onset  . Arthritis Mother   . Heart disease Sister     Massive MI age  34.  . Arrhythmia Brother   . Melanoma Son   . Heart attack Brother     No Known Allergies  Current Outpatient Prescriptions on File Prior to Visit  Medication Sig Dispense Refill  . aspirin EC 81 MG tablet Take 81 mg by mouth every morning.     [name] atorvastatin (LIPITOR) 20 MG tablet Take 2 tablets (40 mg total) by mouth daily. 30 tablet 3  . diazepam (VALIUM) 10 MG tablet Take 1 tablet (10 mg total) by mouth at bedtime as needed for anxiety. 30 tablet 0  . fish oil-omega-3 fatty acids 1000 MG capsule Take 2 g by mouth 2 (two) times daily.      [name] glucose blood test strip Check blood sugar daily and as needed. 100 each 12  . hydrochlorothiazide (HYDRODIURIL) 25 MG tablet Take 1 tablet (25 mg total) by mouth daily. 30 tablet 2  . HYDROcodone-acetaminophen (NORCO) 7.5-325 MG per tablet Take 1 tablet by mouth every 6 (six) hours as needed for moderate pain. 90 tablet 0  . Insulin Glargine  (LANTUS SOLOSTAR) 100 UNIT/ML Solostar Pen Inject 10 Units into the skin daily at 10 pm. 15 mL 2  . losartan (COZAAR) 25 MG tablet Take 1 tablet (25 mg total) by mouth daily. 30 tablet 0  . metFORMIN (GLUCOPHAGE) 500 MG tablet TAKE TWO TABLETS BY MOUTH TWICE DAILY WITH MEALS 120 tablet 1  . nitroGLYCERIN (NITROSTAT) 0.4 MG SL tablet Place 1 tablet (0.4 mg total) under the tongue every 5 (five) minutes x 3 doses as needed for chest pain (donot take with viagra). 60 tablet 12  . PARoxetine (PAXIL) 30 MG tablet TAKE ONE TABLET BY MOUTH EVERY MORNING 30 tablet 1  . tamsulosin (FLOMAX) 0.4 MG CAPS capsule Take 1 capsule (0.4 mg total) by mouth daily. 30 capsule 0   No current facility-administered medications on file prior to visit.    BP 118/70 mmHg  Pulse 55  Temp(Src) 97.8 F (36.6 C) (Oral)  Resp 16  Ht 6' (1.829 m)  Wt 279 lb 9.6 oz (126.826 kg)  BMI 37.91 kg/m2  SpO2 98%       Objective:   Physical Exam  Constitutional: He is oriented to person, place, and time. He appears well-developed and well-nourished. No distress.  HENT:  Head: Normocephalic and atraumatic.  Cardiovascular: Normal rate and regular rhythm.   No murmur heard. Pulmonary/Chest: Effort normal and breath sounds normal. No respiratory distress. He has no wheezes. He has no rales.  Musculoskeletal: He exhibits no edema.  Neurological: He is alert and oriented to person, place, and time.  Skin: Skin is warm and dry.  Psychiatric: He has a normal mood and affect. His behavior is normal. Thought content normal.          Assessment & Plan:  MMSE performed today- pt scored in normal range.  Suspect that illiteracy is contributing factor to his difficulty understanding instructions and getting meds/dosages confused. CMA reviewed each of his pill bottles and organized for him today.

## 2014-03-01 NOTE — Telephone Encounter (Signed)
Spoke with [facility], they already have a nurse scheduled to meet with pt on 03/05/14 at 2:30pm. Notified pt.

## 2014-03-01 NOTE — Assessment & Plan Note (Signed)
Stable, no further hypoglycemic events.

## 2014-03-01 NOTE — Assessment & Plan Note (Signed)
Resolved, stress test negative. Has follow up with cardiology.

## 2014-03-01 NOTE — Patient Instructions (Signed)
Please complete lab work prior to leaving. Go to ER if you develop recurrent chest pain. Follow up in 3 months.

## 2014-03-12 ENCOUNTER — Ambulatory Visit (INDEPENDENT_AMBULATORY_CARE_PROVIDER_SITE_OTHER): Payer: PPO | Admitting: Family

## 2014-03-12 ENCOUNTER — Ambulatory Visit (HOSPITAL_BASED_OUTPATIENT_CLINIC_OR_DEPARTMENT_OTHER)
Admission: RE | Admit: 2014-03-12 | Discharge: 2014-03-12 | Disposition: A | Discharge status: Home / Self Care | Payer: PPO | Source: Ambulatory Visit | Attending: Family | Admitting: Family

## 2014-03-12 ENCOUNTER — Telehealth: Payer: Self-pay

## 2014-03-12 ENCOUNTER — Emergency Department (HOSPITAL_COMMUNITY)
Admission: EM | Admit: 2014-03-12 | Discharge: 2014-03-12 | Disposition: A | Discharge status: Home / Self Care | Payer: PPO | Attending: Emergency Medicine | Admitting: Emergency Medicine

## 2014-03-12 ENCOUNTER — Encounter: Payer: Self-pay | Admitting: Family

## 2014-03-12 ENCOUNTER — Encounter (HOSPITAL_COMMUNITY): Payer: Self-pay | Admitting: Physical Medicine and Rehabilitation

## 2014-03-12 VITALS — BP 118/72 | HR 72 | Temp 98.2°F | Resp 16 | Ht 72.0 in | Wt 274.2 lb

## 2014-03-12 DIAGNOSIS — R300 Dysuria: Secondary | ICD-10-CM | POA: Insufficient documentation

## 2014-03-12 DIAGNOSIS — N139 Obstructive and reflux uropathy, unspecified: Secondary | ICD-10-CM | POA: Insufficient documentation

## 2014-03-12 DIAGNOSIS — G8929 Other chronic pain: Secondary | ICD-10-CM | POA: Insufficient documentation

## 2014-03-12 DIAGNOSIS — E669 Obesity, unspecified: Secondary | ICD-10-CM | POA: Insufficient documentation

## 2014-03-12 DIAGNOSIS — R319 Hematuria, unspecified: Secondary | ICD-10-CM

## 2014-03-12 DIAGNOSIS — Z85828 Personal history of other malignant neoplasm of skin: Secondary | ICD-10-CM | POA: Insufficient documentation

## 2014-03-12 DIAGNOSIS — I10 Essential (primary) hypertension: Secondary | ICD-10-CM | POA: Insufficient documentation

## 2014-03-12 DIAGNOSIS — Z7982 Long term (current) use of aspirin: Secondary | ICD-10-CM | POA: Diagnosis not present

## 2014-03-12 DIAGNOSIS — Z87891 Personal history of nicotine dependence: Secondary | ICD-10-CM | POA: Diagnosis not present

## 2014-03-12 DIAGNOSIS — E119 Type 2 diabetes mellitus without complications: Secondary | ICD-10-CM | POA: Diagnosis not present

## 2014-03-12 DIAGNOSIS — K219 Gastro-esophageal reflux disease without esophagitis: Secondary | ICD-10-CM | POA: Diagnosis not present

## 2014-03-12 DIAGNOSIS — F329 Major depressive disorder, single episode, unspecified: Secondary | ICD-10-CM | POA: Insufficient documentation

## 2014-03-12 DIAGNOSIS — N200 Calculus of kidney: Secondary | ICD-10-CM | POA: Insufficient documentation

## 2014-03-12 DIAGNOSIS — G4733 Obstructive sleep apnea (adult) (pediatric): Secondary | ICD-10-CM | POA: Diagnosis not present

## 2014-03-12 DIAGNOSIS — N209 Urinary calculus, unspecified: Secondary | ICD-10-CM

## 2014-03-12 DIAGNOSIS — M1990 Unspecified osteoarthritis, unspecified site: Secondary | ICD-10-CM | POA: Insufficient documentation

## 2014-03-12 DIAGNOSIS — Z79899 Other long term (current) drug therapy: Secondary | ICD-10-CM | POA: Diagnosis not present

## 2014-03-12 DIAGNOSIS — Z9889 Other specified postprocedural states: Secondary | ICD-10-CM | POA: Insufficient documentation

## 2014-03-12 DIAGNOSIS — Z9981 Dependence on supplemental oxygen: Secondary | ICD-10-CM | POA: Diagnosis not present

## 2014-03-12 DIAGNOSIS — Z87442 Personal history of urinary calculi: Secondary | ICD-10-CM | POA: Insufficient documentation

## 2014-03-12 LAB — I-STAT CHEM 8, ED
BUN: 31 mg/dL — ABNORMAL HIGH (ref 6–23)
Calcium, Ion: 1.13 mmol/L (ref 1.13–1.30)
Chloride: 103 mmol/L (ref 96–112)
Creatinine, Ser: 1.3 mg/dL (ref 0.50–1.35)
Glucose, Bld: 117 mg/dL — ABNORMAL HIGH (ref 70–99)
HCT: 38 % — ABNORMAL LOW (ref 39.0–52.0)
HEMOGLOBIN: 12.9 g/dL — AB (ref 13.0–17.0)
Potassium: 4.2 mmol/L (ref 3.5–5.1)
Sodium: 140 mmol/L (ref 135–145)
TCO2: 24 mmol/L (ref 0–100)

## 2014-03-12 LAB — URINE MICROSCOPIC-ADD ON

## 2014-03-12 LAB — URINALYSIS, ROUTINE W REFLEX MICROSCOPIC
Bilirubin Urine: NEGATIVE
Glucose, UA: NEGATIVE mg/dL
Ketones, ur: NEGATIVE mg/dL
Leukocytes, UA: NEGATIVE
Nitrite: NEGATIVE
Protein, ur: NEGATIVE mg/dL
SPECIFIC GRAVITY, URINE: 1.016 (ref 1.005–1.030)
UROBILINOGEN UA: 0.2 mg/dL (ref 0.0–1.0)
pH: 6.5 (ref 5.0–8.0)

## 2014-03-12 MED ORDER — CIPROFLOXACIN HCL 250 MG PO TABS: 250.0000 mg | ORAL_TABLET | Freq: Two times a day (BID) | ORAL | Status: DC

## 2014-03-12 MED ORDER — PAROXETINE HCL 30 MG PO TABS: ORAL_TABLET | ORAL | Status: DC

## 2014-03-12 MED ORDER — NITROGLYCERIN 0.4 MG SL SUBL: 0.4000 mg | SUBLINGUAL_TABLET | SUBLINGUAL | Status: DC | PRN

## 2014-03-12 MED ORDER — TAMSULOSIN HCL 0.4 MG PO CAPS: 0.4000 mg | ORAL_CAPSULE | Freq: Every day | ORAL | Status: DC

## 2014-03-12 NOTE — ED Notes (Signed)
Pt reports hematuria, lower abdominal pain and urinary frequency. Onset Saturday morning. 9/10 pain upon arrival to ED. Pt is alert and oriented x4.

## 2014-03-12 NOTE — ED Notes (Signed)
Pt. Left with all belongings and refused wheelchair 

## 2014-03-12 NOTE — Telephone Encounter (Signed)
Anderson Malta, can you call urology tomorrow am and try to get him tomorrow.  Has obstructing stone on CT.

## 2014-03-12 NOTE — Progress Notes (Signed)
Subjective:    Patient ID: Christian Morris, male    DOB: 03-13-1940, 74 y.o.   MRN: [id number]  HPI  Patient here with chief complaint of lower abdominal pain, frequency last night. Went to the ED. They performed a BMET and urinalysis.  No imaging was performed. He is here today because pain persists. He is using norco and advil with some relief of his pain. Pain waxes/wanes from 10/10 to 3/10.  Denies gross hematuria or fever, or mylagia. Denies associated flank pain.  He reports that he has not visualized any stones in the commode.  He reports no dysuria but he reports some recent worsening of urinary urgency.  Reports overall pain is improving since his ED visit.    Past Medical History  Diagnosis Date  . Hypertension   . Hyperlipidemia   . History of gastric ulcer ~ 1958    no recurrence since.  . Hypogonadism male 01/11/2011  . Lung nodule     right 16 mm, seen initially 6/12. 20mm in 08/2011.  [name] GERD (gastroesophageal reflux disease)   . Fatty liver disease, nonalcoholic   . Kidney stones   . Squamous cell cancer of skin of forearm 01/23/2011    left  . Depression   . Type II diabetes mellitus   . History of blood transfusion ~ 1958    "related to bleeding ulcers"  . Daily headache     "here lately" (02/21/2014)  . Arthritis     "legs" (02/21/2014)  . Chronic lower back pain   . Anxiety   . OSA on CPAP   . Urinary hesitancy     Review of Systems     Objective:    Physical Exam  Constitutional: He is oriented to person, place, and time. He appears well-developed and well-nourished. No distress.  HENT:  Head: Normocephalic and atraumatic.  Cardiovascular: Normal rate and regular rhythm.   No murmur heard. Pulmonary/Chest: Effort normal and breath sounds normal. No respiratory distress. He has no wheezes. He has no rales.  Abdominal: Soft.  Mild suprapubic tenderness to palpation  Musculoskeletal: He exhibits no edema.  Lymphadenopathy:    He has no cervical  adenopathy.  Neurological: He is alert and oriented to person, place, and time.  Skin: Skin is warm and dry.  Psychiatric: He has a normal mood and affect. His behavior is normal. Thought content normal.    BP 118/72 mmHg  Pulse 72  Temp(Src) 98.2 F (36.8 C) (Oral)  Resp 16  Ht 6' (1.829 m)  Wt 274 lb 3.2 oz (124.376 kg)  BMI 37.18 kg/m2  SpO2 99% Wt Readings from Last 3 Encounters:  03/12/14 274 lb 3.2 oz (124.376 kg)  03/01/14 279 lb 9.6 oz (126.826 kg)  02/23/14 274 lb 9.6 oz (124.558 kg)     Lab Results  Component Value Date   WBC 8.3 02/23/2014   HGB 12.9* 03/12/2014   HCT 38.0* 03/12/2014   PLT 227 02/23/2014   GLUCOSE 117* 03/12/2014   CHOL 249* 02/22/2014   TRIG 372* 02/22/2014   HDL 31* 02/22/2014   LDLCALC 144* 02/22/2014   ALT 9 07/17/2013   AST 13 07/17/2013   NA 140 03/12/2014   K 4.2 03/12/2014   CL 103 03/12/2014   CREATININE 1.30 03/12/2014   BUN 31* 03/12/2014   CO2 27 03/01/2014   TSH 0.550 02/22/2014   PSA 2.13 07/17/2013   INR 1.1* 11/15/2012   HGBA1C 6.5* 02/22/2014   MICROALBUR 6.59* 07/17/2013  No results found.     Assessment & Plan:  Addendum: pt was given strainer and some small stone particles were visualized in strainer.

## 2014-03-12 NOTE — Assessment & Plan Note (Signed)
Suspect secondary to stone.  Will obtain urine culture. Start empiric cipro, pt advised to resume flomax.  Obtain CT abd/pelvis to rule out stone. Will also place referral to urology.   Lab Results  Component Value Date   PSA 2.13 07/17/2013   PSA 1.94 10/28/2012   PSA 1.52 07/25/2012

## 2014-03-12 NOTE — ED Provider Notes (Signed)
CSN: [id number]     Arrival date & time 03/12/14  0440 History   First MD Initiated Contact with Patient 03/12/14 0507     Chief Complaint  Patient presents with  . Hematuria  . Abdominal Pain     (Consider location/radiation/quality/duration/timing/severity/associated sxs/prior Treatment) HPI Christian Morris is a 74 y.o. male with PMH of HTN, HLD, nephrolithiasis presenting today with abdominal pain and hematuria. Patient states this has been going on for the past couple of days. He cannot describe how the pain feels, there is no radiation. He has not done anything to make his symptoms better or worse. He has not had nausea or vomiting. He denies any changes in his bowel movements. He notes increased urinary frequency with small output. He denies any history of prostate problems.  Patient has mild dysuria.  10 Systems reviewed and are negative for acute change except as noted in the HPI.    Past Medical History  Diagnosis Date  . Hypertension   . Hyperlipidemia   . History of gastric ulcer ~ 1958    no recurrence since.  . Hypogonadism male 01/11/2011  . Lung nodule     right 16 mm, seen initially 6/12. 47mm in 08/2011.  [name] GERD (gastroesophageal reflux disease)   . Fatty liver disease, nonalcoholic   . Kidney stones   . Squamous cell cancer of skin of forearm 01/23/2011    left  . Depression   . Type II diabetes mellitus   . History of blood transfusion ~ 1958    "related to bleeding ulcers"  . Daily headache     "here lately" (02/21/2014)  . Arthritis     "legs" (02/21/2014)  . Chronic lower back pain   . Anxiety   . OSA on CPAP   . Urinary hesitancy    Past Surgical History  Procedure Laterality Date  . Melanoma excision Left 01/23/2011    forearm  . Left heart catheterization with coronary angiogram N/A 11/17/2012    Procedure: LEFT HEART CATHETERIZATION WITH CORONARY ANGIOGRAM;  Surgeon: Burnell Blanks, MD;  Location: [facility] CATH LAB;  Service: Cardiovascular;   Laterality: N/A;  . Cataract extraction w/ intraocular lens implant Left 08/2009    Dr Katy Fitch  . Cardiac catheterization     Family History  Problem Relation Age of Onset  . Arthritis Mother   . Heart disease Sister     Massive MI age 72.  . Arrhythmia Brother   . Melanoma Son   . Heart attack Brother    History  Substance Use Topics  . Smoking status: Former Smoker -- 1.00 packs/day for 25 years    Types: Cigarettes    Quit date: 01/05/1982  . Smokeless tobacco: Never Used  . Alcohol Use: Yes     Comment: "quit drinking in the 1980's"    Review of Systems    Allergies  Review of patient's allergies indicates no known allergies.  Home Medications   Prior to Admission medications   Medication Sig Start Date End Date Taking? Authorizing Provider  aspirin EC 81 MG tablet Take 81 mg by mouth every morning.     Historical Provider, MD  atorvastatin (LIPITOR) 20 MG tablet Take 2 tablets (40 mg total) by mouth daily. 02/23/14   Florencia Reasons, MD  diazepam (VALIUM) 10 MG tablet Take 1 tablet (10 mg total) by mouth at bedtime as needed for anxiety. 02/21/14   [name], NP  fish oil-omega-3 fatty acids 1000 MG capsule Take  2 g by mouth 2 (two) times daily.      Historical Provider, MD  glucose blood test strip Check blood sugar daily and as needed. 02/26/14   [name], NP  hydrochlorothiazide (HYDRODIURIL) 25 MG tablet Take 1 tablet (25 mg total) by mouth daily. 01/16/14   [name], NP  HYDROcodone-acetaminophen (NORCO) 7.5-325 MG per tablet Take 1 tablet by mouth every 6 (six) hours as needed for moderate pain. 02/21/14   [name], NP  Insulin Glargine (LANTUS SOLOSTAR) 100 UNIT/ML Solostar Pen Inject 10 Units into the skin daily at 10 pm. 02/23/14   Florencia Reasons, MD  losartan (COZAAR) 25 MG tablet Take 1 tablet (25 mg total) by mouth daily. 02/23/14   Florencia Reasons, MD  metFORMIN (GLUCOPHAGE) 500 MG tablet TAKE TWO TABLETS BY MOUTH TWICE DAILY WITH MEALS 02/18/14    [name], NP  nitroGLYCERIN (NITROSTAT) 0.4 MG SL tablet Place 1 tablet (0.4 mg total) under the tongue every 5 (five) minutes x 3 doses as needed for chest pain (donot take with viagra). 10/15/12   Ripudeep Krystal Eaton, MD  PARoxetine (PAXIL) 30 MG tablet TAKE ONE TABLET BY MOUTH EVERY MORNING 01/01/14   [name], NP  tamsulosin (FLOMAX) 0.4 MG CAPS capsule Take 1 capsule (0.4 mg total) by mouth daily. 02/23/14   Florencia Reasons, MD   BP 126/56 mmHg  Pulse 61  Temp(Src) 97.7 F (36.5 C) (Oral)  Resp 18  SpO2 100% Physical Exam  Constitutional: He is oriented to person, place, and time. Vital signs are normal. He appears well-developed and well-nourished.  Non-toxic appearance. He does not appear ill. No distress.  Obese male  HENT:  Head: Normocephalic and atraumatic.  Nose: Nose normal.  Mouth/Throat: Oropharynx is clear and moist. No oropharyngeal exudate.  Eyes: Conjunctivae and EOM are normal. Pupils are equal, round, and reactive to light. No scleral icterus.  Neck: Normal range of motion. Neck supple. No tracheal deviation, no edema, no erythema and normal range of motion present. No thyroid mass and no thyromegaly present.  Cardiovascular: Normal rate, regular rhythm, S1 normal, S2 normal, normal heart sounds, intact distal pulses and normal pulses.  Exam reveals no gallop and no friction rub.   No murmur heard. Pulses:      Radial pulses are 2+ on the right side, and 2+ on the left side.       Dorsalis pedis pulses are 2+ on the right side, and 2+ on the left side.  Pulmonary/Chest: Effort normal and breath sounds normal. No respiratory distress. He has no wheezes. He has no rhonchi. He has no rales.  Abdominal: Soft. Normal appearance and bowel sounds are normal. He exhibits no distension, no ascites and no mass. There is no hepatosplenomegaly. There is no tenderness. There is no rebound, no guarding and no CVA tenderness.  Genitourinary: Penis normal. No penile tenderness.   No scrotal swelling or tenderness.  Musculoskeletal: Normal range of motion. He exhibits no edema or tenderness.  Lymphadenopathy:    He has no cervical adenopathy.  Neurological: He is alert and oriented to person, place, and time. He has normal strength. No cranial nerve deficit or sensory deficit. He exhibits normal muscle tone.  Skin: Skin is warm, dry and intact. No petechiae and no rash noted. He is not diaphoretic. No erythema. No pallor.  Psychiatric: He has a normal mood and affect. His behavior is normal. Judgment normal.  Nursing note and vitals reviewed.   ED Course  Procedures (including  critical care time) Labs Review Labs Reviewed  URINALYSIS, ROUTINE W REFLEX MICROSCOPIC - Abnormal; Notable for the following:    APPearance CLOUDY (*)    Hgb urine dipstick LARGE (*)    All other components within normal limits  I-STAT CHEM 8, ED - Abnormal; Notable for the following:    BUN 31 (*)    Glucose, Bld 117 (*)    Hemoglobin 12.9 (*)    HCT 38.0 (*)    All other components within normal limits  URINE MICROSCOPIC-ADD ON    Imaging Review No results found.   EKG Interpretation None      MDM   Final diagnoses:  None    Patient since emergency department for hematuria and increased frequency. He does have a history of nephrolithiasis. He is currently denying any flank pain. Abdominal exam does not reveal any tenderness although he states he has pain in the suprapubic area. Urinalysis is currently pending. Patient denies any history of prostate enlargement however his history suggests otherwise.  Urinalysis does reveal hematuria, creatinine is stable compared to previous results. Patient was instructed that he may have prostate enlargement versus prostate cancer versus nephrolithiasis. Urology follow-up was given to him. He is currently asymptomatic now complaining of pain. His vital signs were within his normal limits and he is safe for discharge.   Everlene Balls,  MD 03/12/14 [phone]

## 2014-03-12 NOTE — Telephone Encounter (Signed)
Discussed with PCP. Per recommendations, follow plan that was set in the office for treatment of stones.  Patient advised.

## 2014-03-12 NOTE — Patient Instructions (Signed)
Please complete CT scan on the first floor. Start cipro (antibiotic). You will be contacted about your referral to urology. Call if symptoms worsen or if symptoms are not improved in 2-3 days.

## 2014-03-12 NOTE — Discharge Instructions (Signed)
Hematuria Christian Morris, there is blood in your urine today. Please follow with urology within 3 days for continued management. This may be an indication for enlarged prostate, prostate cancer, or kidney stones. If your symptoms worsen come back to the emergency department immediately. Thank you. Hematuria is blood in your urine. It can be caused by a bladder infection, kidney infection, prostate infection, kidney stone, or cancer of your urinary tract. Infections can usually be treated with medicine, and a kidney stone usually will pass through your urine. If neither of these is the cause of your hematuria, further workup to find out the reason may be needed. It is very important that you tell your health care provider about any blood you see in your urine, even if the blood stops without treatment or happens without causing pain. Blood in your urine that happens and then stops and then happens again can be a symptom of a very serious condition. Also, pain is not a symptom in the initial stages of many urinary cancers. HOME CARE INSTRUCTIONS   Drink lots of fluid, 3-4 quarts a day. If you have been diagnosed with an infection, cranberry juice is especially recommended, in addition to large amounts of water.  Avoid caffeine, tea, and carbonated beverages because they tend to irritate the bladder.  Avoid alcohol because it may irritate the prostate.  Take all medicines as directed by your health care provider.  If you were prescribed an antibiotic medicine, finish it all even if you start to feel better.  If you have been diagnosed with a kidney stone, follow your health care provider's instructions regarding straining your urine to catch the stone.  Empty your bladder often. Avoid holding urine for long periods of time.  After a bowel movement, women should cleanse front to back. Use each tissue only once.  Empty your bladder before and after sexual intercourse if you are a male. SEEK MEDICAL  CARE IF:  You develop back pain.  You have a fever.  You have a feeling of sickness in your stomach (nausea) or vomiting.  Your symptoms are not better in 3 days. Return sooner if you are getting worse. SEEK IMMEDIATE MEDICAL CARE IF:   You develop severe vomiting and are unable to keep the medicine down.  You develop severe back or abdominal pain despite taking your medicines.  You begin passing a large amount of blood or clots in your urine.  You feel extremely weak or faint, or you pass out. MAKE SURE YOU:   Understand these instructions.  Will watch your condition.  Will get help right away if you are not doing well or get worse. Document Released: 12/22/2004 Document Revised: 05/08/2013 Document Reviewed: 08/22/2012 [facility] Patient Information 2015 [place], [place]. This information is not intended to replace advice given to you by your health care provider. Make sure you discuss any questions you have with your health care provider.

## 2014-03-13 NOTE — Telephone Encounter (Signed)
Pt has appt w/ Dr Louis Meckel today @ 10am

## 2014-03-14 ENCOUNTER — Telehealth: Payer: Self-pay | Admitting: Family

## 2014-03-14 ENCOUNTER — Telehealth: Payer: Self-pay | Admitting: *Deleted

## 2014-03-14 NOTE — Telephone Encounter (Signed)
Caller name:Killings, Rodriques Relation to JK:QASU Call back number:[phone] Pharmacy:  Reason for call: pt would like for you to call him, states he is still sick, and want to ask you some questions pt states he was seen Monday and went to [facility] on yesterday

## 2014-03-14 NOTE — Telephone Encounter (Signed)
Opened in error

## 2014-03-14 NOTE — Telephone Encounter (Signed)
Left message for pt to return my call. Spoke with referral coordinator and was told that urology was supposed to contact pt with appt. System shows pt had appt with Dr Louis Meckel on 03/13/14 at [facility].  Caller name:Payes, Jaquelyn Bitter  Relation to YH:TMBP  Call back number:[phone]  Pharmacy:  Reason for call: pt would like for you to call him, states he is still sick, and want to ask you some questions pt states he was seen Monday and went to [facility] on yesterday

## 2014-03-15 NOTE — Telephone Encounter (Signed)
Pt states he went to Urology on 03/13/14 and has another appointment 03/23/14.  He states that he is feeling somewhat better.  No needs at this time.  He was encouraged to call with questions or concerns.  He stated understanding and agreed.

## 2014-03-15 NOTE — Telephone Encounter (Signed)
Could you please follow up with pt and make sure that he went to urology and is feeling better?

## 2014-03-15 NOTE — Telephone Encounter (Signed)
Pt called back and stated that he was not now feeling too good.  He told scheduler, who offered him an appointment, that he will be dead before tomorrow.  Triage patient.  He is c/o abdominal pain.  States it hurts all over.  Describes pain as a "bad pain."  Nausea. No vomiting.  Denies abdominal bloating.  Last BM: 4-5 days ago.  Not passing much gas.  Pt states he drank a bottle of MOM with no relief.  He's drinking plenty of fluids.    Pt was advise to go to ER or UC.  Pt declined stating he will just wait to see if it passes over.  Pt was encouraged to schedule an appointment.  No appointment available in office.  An appointment was offered at another office.  Pt declined, again stating he will just wait.  An appointment was offered for tomorrow with [name], PA-C at 9:45 am.  Pt accepted appointment.  Appointment scheduled.  Pt was encouraged to drink warm prune juice, warm fluids, and miralax until appointment tomorrow.  Pt stated understanding and agreed.

## 2014-03-16 ENCOUNTER — Telehealth: Payer: Self-pay | Admitting: Family

## 2014-03-16 ENCOUNTER — Ambulatory Visit (INDEPENDENT_AMBULATORY_CARE_PROVIDER_SITE_OTHER): Payer: PPO | Admitting: Family

## 2014-03-16 VITALS — BP 114/55 | HR 100 | Wt 272.0 lb

## 2014-03-16 DIAGNOSIS — F341 Dysthymic disorder: Secondary | ICD-10-CM

## 2014-03-16 DIAGNOSIS — N200 Calculus of kidney: Secondary | ICD-10-CM

## 2014-03-16 MED ORDER — PAROXETINE HCL 30 MG PO TABS: ORAL_TABLET | ORAL | Status: DC

## 2014-03-16 NOTE — Telephone Encounter (Signed)
[facility] [place] [facility] told me Suder that he picked up his Paxil 3 days ago.  He says he did not.  Could you call the pharmacy and see if you can figure out what is going on

## 2014-03-16 NOTE — Telephone Encounter (Signed)
Pt called. He looked through his meds and is not taking paxil. Advised pt to go to pharmacy and pick up refill, start ASAP. Pt verbalizes understanding.

## 2014-03-16 NOTE — Telephone Encounter (Signed)
Spoke with pt. He has paroxetine at home and states he did not realize this is Paxil. States he had not been taking it because he thought we took him off of it.  States he restarted medication today. Advised pt to let us know if anxiety/depression does not improve after restarting medication.

## 2014-03-16 NOTE — Patient Instructions (Signed)
Please go home and check you medication to make sure that you are taking Paxil (the other name is paroxetine)  Call us today and let us know if you are taking the medication or not. Go to the ER if you develop thoughts of hurting yourself or others. Please schedule a follow up appointment in 1 month.

## 2014-03-16 NOTE — Progress Notes (Signed)
Subjective:    Patient ID: Christian Morris, male    DOB: 04-01-1940, 74 y.o.   MRN: [id number]  HPI  Patient here to discuss depression- Reports that he had been feeling depressed for 1 month or more.  Reports that the depression is "out of the blue."  Has laid around for 1 week "all the time."  Denies SI or HI.  Feels hopeless.  He continues valium at bed time.   Constipation- reports that he was constipated for 1 week, had a good BM last night.    Nephrolithiasis- Pt saw urology (report currently unavailable) Reports that he has follow up with Urology on 3/18.  Denies current pain. Urinating without difficulty. Denies gross hematuria.    Past Medical History  Diagnosis Date  . Hypertension   . Hyperlipidemia   . History of gastric ulcer ~ 1958    no recurrence since.  . Hypogonadism male 01/11/2011  . Lung nodule     right 16 mm, seen initially 6/12. 44mm in 08/2011.  [name] GERD (gastroesophageal reflux disease)   . Fatty liver disease, nonalcoholic   . Kidney stones   . Squamous cell cancer of skin of forearm 01/23/2011    left  . Depression   . Type II diabetes mellitus   . History of blood transfusion ~ 1958    "related to bleeding ulcers"  . Daily headache     "here lately" (02/21/2014)  . Arthritis     "legs" (02/21/2014)  . Chronic lower back pain   . Anxiety   . OSA on CPAP   . Urinary hesitancy     Review of Systems     Objective:    Physical Exam  Constitutional: He is oriented to person, place, and time. He appears well-developed and well-nourished.  Neurological: He is alert and oriented to person, place, and time.  Psychiatric:  Anxious, teaful    There were no vitals taken for this visit. Wt Readings from Last 3 Encounters:  03/12/14 274 lb 3.2 oz (124.376 kg)  03/01/14 279 lb 9.6 oz (126.826 kg)  02/23/14 274 lb 9.6 oz (124.558 kg)     Lab Results  Component Value Date   WBC 8.3 02/23/2014   HGB 12.9* 03/12/2014   HCT 38.0* 03/12/2014   PLT  227 02/23/2014   GLUCOSE 117* 03/12/2014   CHOL 249* 02/22/2014   TRIG 372* 02/22/2014   HDL 31* 02/22/2014   LDLCALC 144* 02/22/2014   ALT 9 07/17/2013   AST 13 07/17/2013   NA 140 03/12/2014   K 4.2 03/12/2014   CL 103 03/12/2014   CREATININE 1.30 03/12/2014   BUN 31* 03/12/2014   CO2 27 03/01/2014   TSH 0.550 02/22/2014   PSA 2.13 07/17/2013   INR 1.1* 11/15/2012   HGBA1C 6.5* 02/22/2014   MICROALBUR 6.59* 07/17/2013    Ct Renal Stone Study  03/12/2014   CLINICAL DATA:  74 year old male with left flank pain and microscopic hematuria since yesterday. Initial encounter.  EXAM: CT ABDOMEN AND PELVIS WITHOUT CONTRAST  TECHNIQUE: Multidetector CT imaging of the abdomen and pelvis was performed following the standard protocol without IV contrast.  COMPARISON:  CT Abdomen and Pelvis 01/07/2013 and earlier.  FINDINGS: Stable cardiomegaly. No pericardial or pleural effusion. Stable calcified granuloma in the posterior basal segment of the right lower lobe. No acute lung base findings.  Chronic spondylolysis and spondylolisthesis at the lumbosacral junction. Advanced disc, endplate, and posterior element degeneration. No acute osseous abnormality identified.  No pelvic free fluid. Negative distal colon. The sigmoid colon to the mid transverse is negative aside from retained stool. The right colon is more decompressed. Negative appendix. No dilated small bowel. Negative non contrast stomach, duodenum, liver, gallbladder, spleen, pancreas, and adrenal glands. No abdominal free fluid. No lymphadenopathy. Calcified atherosclerosis of the aorta and its branches.  Chronic bilateral perinephric stranding. On the left there is a a rectangular lower pole calculus measuring up to 9 mm. No left hydronephrosis. Negative course of the left ureter. Chronic inferior left hemipelvis phlebolith.  On the right there is hydronephrosis and hydroureter. There is periureteral stranding an the right ureter is abnormal to the  level of an obstructing calculus measuring 6-7 mm diameter located 5 cm distal to the right ureteropelvic junction. Intra renal calculi in the right lower pole measure up to 6 mm. Beyond the proximal right ureteral calculus there is persistent hydroureter, but no other ureteral calculus.  IMPRESSION: 1. Acute obstructive uropathy on the right with a 6-7 mm calculus located 5 cm distal to the right UPJ. 2. Bilateral nephrolithiasis.   Electronically Signed   By: Genevie Ann M.D.   On: 03/12/2014 16:45       Assessment & Plan:

## 2014-03-18 NOTE — Assessment & Plan Note (Signed)
Deteriorated. After further discussion and having pt check his medications, we discovered that he is not taking his paxil. This is cause for sudden deterioration in his symptoms. Advised pt to resume paxil.

## 2014-03-18 NOTE — Assessment & Plan Note (Addendum)
Clinicaly stable. Working with urology.  Advised pt to keep follow up apt.

## 2014-03-26 ENCOUNTER — Telehealth: Payer: Self-pay | Admitting: Family

## 2014-03-26 NOTE — Telephone Encounter (Signed)
Caller name:Upson, Hanan Relation to NB:ZXYD Call back number:[phone] Pharmacy:  Reason for call: pt would like for you to call him states he is still passing kidney stones from 2 weeks ago and need to know what to do.

## 2014-03-26 NOTE — Telephone Encounter (Signed)
Spoke with pt and advised him to contact urologist office at [phone] (Dr Louis Meckel). Pt voices understanding.

## 2014-03-29 ENCOUNTER — Other Ambulatory Visit: Payer: Self-pay | Admitting: Family

## 2014-03-29 ENCOUNTER — Telehealth: Payer: Self-pay | Admitting: Family

## 2014-03-29 DIAGNOSIS — F419 Anxiety disorder, unspecified: Secondary | ICD-10-CM

## 2014-03-29 MED ORDER — DIAZEPAM 10 MG PO TABS: 10.0000 mg | ORAL_TABLET | Freq: Every evening | ORAL | Status: DC | PRN

## 2014-03-29 NOTE — Telephone Encounter (Signed)
Rx faxed to pharmacy  

## 2014-03-29 NOTE — Telephone Encounter (Signed)
I don't see any meds pending?

## 2014-03-29 NOTE — Telephone Encounter (Signed)
Caller name: Durk, Carmen Relation to pt: self  Call back number: [phone] Pharmacy: [facility] [address] [place], [place] - [address]  Reason for call:  Pt requesting a refill diazepam (VALIUM) 10 MG tablet

## 2014-03-29 NOTE — Telephone Encounter (Signed)
Rx printed and forwarded to Provider for signature.

## 2014-03-29 NOTE — Telephone Encounter (Signed)
I printed the Rx and placed it in your "signature" folder.

## 2014-03-30 NOTE — Telephone Encounter (Signed)
Signed.

## 2014-04-09 ENCOUNTER — Encounter: Payer: Self-pay | Admitting: Family

## 2014-04-09 ENCOUNTER — Ambulatory Visit (INDEPENDENT_AMBULATORY_CARE_PROVIDER_SITE_OTHER): Payer: PPO | Admitting: Family

## 2014-04-09 VITALS — BP 110/78 | HR 94 | Temp 97.5°F | Resp 18 | Ht 72.0 in | Wt 263.0 lb

## 2014-04-09 DIAGNOSIS — F341 Dysthymic disorder: Secondary | ICD-10-CM | POA: Diagnosis not present

## 2014-04-09 DIAGNOSIS — R634 Abnormal weight loss: Secondary | ICD-10-CM | POA: Diagnosis not present

## 2014-04-09 DIAGNOSIS — I10 Essential (primary) hypertension: Secondary | ICD-10-CM

## 2014-04-09 MED ORDER — HYDROCODONE-ACETAMINOPHEN 7.5-325 MG PO TABS: 1.0000 | ORAL_TABLET | Freq: Four times a day (QID) | ORAL | Status: DC | PRN

## 2014-04-09 MED ORDER — LOSARTAN POTASSIUM 25 MG PO TABS: 25.0000 mg | ORAL_TABLET | Freq: Every day | ORAL | Status: DC

## 2014-04-09 NOTE — Assessment & Plan Note (Signed)
It does not appear that he is taking losartan. Will refill with close monitoring of his BP (BP is on low side) but he is on low dose of losartan and this is also being used for renal protection. Will plan to bring the pt back in 1 month for bp recheck and also weight recheck.

## 2014-04-09 NOTE — Progress Notes (Signed)
Subjective:    Patient ID: Christian Morris, male    DOB: 1940/07/30, 74 y.o.   MRN: [id number]  HPI  Christian Morris is a 74 yr old male who presents today to follow up on anxiety and depression.   He reports that he is living with his niece, has lost some weight. He reports that he has a good appetite. He attributes his weight loss to an improved diet since he has been living with his niece.  He has now restarted paxil and reports feeling much better.   Wt Readings from Last 3 Encounters:  04/09/14 263 lb (119.296 kg)  03/16/14 272 lb (123.378 kg)  03/12/14 274 lb 3.2 oz (124.376 kg)   BP Readings from Last 3 Encounters:  04/09/14 110/78  03/16/14 114/55  03/12/14 118/72      Review of Systems See HPI  Past Medical History  Diagnosis Date  . Hypertension   . Hyperlipidemia   . History of gastric ulcer ~ 1958    no recurrence since.  . Hypogonadism male 01/11/2011  . Lung nodule     right 16 mm, seen initially 6/12. 29mm in 08/2011.  [name] GERD (gastroesophageal reflux disease)   . Fatty liver disease, nonalcoholic   . Kidney stones   . Squamous cell cancer of skin of forearm 01/23/2011    left  . Depression   . Type II diabetes mellitus   . History of blood transfusion ~ 1958    "related to bleeding ulcers"  . Daily headache     "here lately" (02/21/2014)  . Arthritis     "legs" (02/21/2014)  . Chronic lower back pain   . Anxiety   . OSA on CPAP   . Urinary hesitancy     History   Social History  . Marital Status: Divorced    Spouse Name: N/A  . Number of Children: 3  . Years of Education: N/A   Occupational History  . RETIRED BUS DRIVER    Social History Main Topics  . Smoking status: Former Smoker -- 1.00 packs/day for 25 years    Types: Cigarettes    Quit date: 01/05/1982  . Smokeless tobacco: Never Used  . Alcohol Use: Yes     Comment: "quit drinking in the 1980's"  . Drug Use: No  . Sexual Activity: Yes   Other Topics Concern  . 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    Past Surgical History  Procedure Laterality Date  . Melanoma excision Left 01/23/2011    forearm  . Left heart catheterization with coronary angiogram N/A 11/17/2012    Procedure: LEFT HEART CATHETERIZATION WITH CORONARY ANGIOGRAM;  Surgeon: Burnell Blanks, MD;  Location: [facility] CATH LAB;  Service: Cardiovascular;  Laterality: N/A;  . Cataract extraction w/ intraocular lens implant Left 08/2009    Dr Katy Fitch  . Cardiac catheterization      Family History  Problem Relation Age of Onset  . Arthritis Mother   . Heart disease Sister     Massive MI age 17.  . Arrhythmia Brother   . Melanoma Son   . Heart attack Brother     No Known Allergies  Current Outpatient Prescriptions on File Prior to Visit  Medication Sig Dispense Refill  . aspirin EC 81 MG tablet Take 81 mg by mouth every morning.     [name] atorvastatin (LIPITOR)  20 MG tablet Take 2 tablets (40 mg total) by mouth daily. 30 tablet 3  . diazepam (VALIUM) 10 MG tablet Take 1 tablet (10 mg total) by mouth at bedtime as needed for anxiety. 30 tablet 0  . fish oil-omega-3 fatty acids 1000 MG capsule Take 2 g by mouth 2 (two) times daily.      [name] glucose blood test strip Check blood sugar daily and as needed. 100 each 12  . hydrochlorothiazide (HYDRODIURIL) 25 MG tablet Take 1 tablet (25 mg total) by mouth daily. 30 tablet 2  . Insulin Glargine (LANTUS SOLOSTAR) 100 UNIT/ML Solostar Pen Inject 10 Units into the skin daily at 10 pm. 15 mL 2  . metFORMIN (GLUCOPHAGE) 500 MG tablet TAKE TWO TABLETS BY MOUTH TWICE DAILY WITH MEALS 120 tablet 1  . nitroGLYCERIN (NITROSTAT) 0.4 MG SL tablet Place 1 tablet (0.4 mg total) under the tongue every 5 (five) minutes x 3 doses as needed for chest pain (donot take with viagra). 60 tablet 12  . PARoxetine (PAXIL) 30 MG tablet TAKE ONE TABLET BY MOUTH EVERY MORNING 30 tablet 2  . tamsulosin (FLOMAX)  0.4 MG CAPS capsule Take 1 capsule (0.4 mg total) by mouth daily. 30 capsule 0   No current facility-administered medications on file prior to visit.    BP 110/78 mmHg  Pulse 94  Temp(Src) 97.5 F (36.4 C) (Oral)  Resp 18  Ht 6' (1.829 m)  Wt 263 lb (119.296 kg)  BMI 35.66 kg/m2  SpO2 96%       Objective:   Physical Exam  Constitutional: He is oriented to person, place, and time. He appears well-developed and well-nourished. No distress.  Cardiovascular: Normal rate and regular rhythm.   No murmur heard. Pulmonary/Chest: Effort normal and breath sounds normal. No respiratory distress. He has no wheezes. He has no rales. He exhibits no tenderness.  Musculoskeletal: He exhibits no edema.  Neurological: He is alert and oriented to person, place, and time.  Psychiatric: He has a normal mood and affect. His behavior is normal. Judgment and thought content normal.          Assessment & Plan:

## 2014-04-09 NOTE — Assessment & Plan Note (Signed)
Improved now that he is back on paxil, continue same.

## 2014-04-09 NOTE — Patient Instructions (Signed)
Please complete Urine Drug screen in the lab. Restart losartan.   Follow up in 1 month.

## 2014-04-09 NOTE — Progress Notes (Signed)
Pre visit review using our clinic review tool, if applicable. No additional management support is needed unless otherwise documented below in the visit note. 

## 2014-04-17 ENCOUNTER — Telehealth: Payer: Self-pay | Admitting: Family

## 2014-04-17 MED ORDER — METFORMIN HCL 500 MG PO TABS: ORAL_TABLET | ORAL | Status: DC

## 2014-04-17 MED ORDER — NITROGLYCERIN 0.4 MG SL SUBL: 0.4000 mg | SUBLINGUAL_TABLET | SUBLINGUAL | Status: DC | PRN

## 2014-04-17 MED ORDER — ATORVASTATIN CALCIUM 20 MG PO TABS: 40.0000 mg | ORAL_TABLET | Freq: Every day | ORAL | Status: DC

## 2014-04-17 MED ORDER — INSULIN GLARGINE 100 UNIT/ML SOLOSTAR PEN: 10.0000 [IU] | PEN_INJECTOR | Freq: Every day | SUBCUTANEOUS | Status: DC

## 2014-04-17 MED ORDER — HYDROCHLOROTHIAZIDE 25 MG PO TABS
25.0000 mg | ORAL_TABLET | Freq: Every day | ORAL | Status: DC
Start: 2014-04-17 — End: 2014-08-17

## 2014-04-17 MED ORDER — TAMSULOSIN HCL 0.4 MG PO CAPS: 0.4000 mg | ORAL_CAPSULE | Freq: Every day | ORAL | Status: DC

## 2014-04-17 MED ORDER — PAROXETINE HCL 30 MG PO TABS: ORAL_TABLET | ORAL | Status: DC

## 2014-04-17 MED ORDER — LOSARTAN POTASSIUM 25 MG PO TABS: 25.0000 mg | ORAL_TABLET | Freq: Every day | ORAL | Status: DC

## 2014-04-17 NOTE — Telephone Encounter (Signed)
Left detailed message on voicemail re: Rx completion and to call if any questions.

## 2014-04-17 NOTE — Telephone Encounter (Signed)
rxs sent

## 2014-04-17 NOTE — Telephone Encounter (Signed)
Caller name: Ethen  Relation to pt: self Call back number: Pharmacy:  Reason for call:   Requesting that all meds be changed to [facility] on [address]. No longer using [facility].

## 2014-04-17 NOTE — Telephone Encounter (Signed)
Phone # below is to [facility] on [address] and pharmacy already in current contact. Deleted [facility] from profile. I have pended Rxs please advise if ok to send.

## 2014-04-19 ENCOUNTER — Encounter: Payer: PPO | Admitting: Cardiology

## 2014-04-19 ENCOUNTER — Other Ambulatory Visit: Payer: Self-pay

## 2014-04-19 NOTE — Patient Outreach (Signed)
Unsuccessful attempt made to contact patient via telephone for community case management. Will attempt to contact patient on tomorrow, April 20, 2014

## 2014-04-20 ENCOUNTER — Other Ambulatory Visit: Payer: Self-pay

## 2014-04-20 NOTE — Patient Outreach (Signed)
[facility]) Care Management  04/20/2014  Christian Morris 30-Dec-1940 [id number]   Patient and this RNCM discussed patient's progress related to getting stable housing.  Patient stated he had to move, temporarily, in with his nephew in [place] after his landlord asked him to move.  Patient states he will be moving into a house he previously lived on [place], [place].    Patient states he still comes to [facility] during the day and would like to continue with [facility] but would have to meet in another location for right now.

## 2014-04-26 ENCOUNTER — Other Ambulatory Visit: Payer: Self-pay

## 2014-04-26 NOTE — Patient Outreach (Signed)
[facility]) Care Management  04/26/2014  Traivon Morrical Kutsch 02-Nov-1940 [id number]   Visit scheduled for today, however, patient states forgetting after reminder call. Patient had requested this RNCM and he meet at [place] because of his unstable living situation. Patient to move into stable housing on [address] Next month.   Patient was agreeable to telephonic encounter.

## 2014-05-01 ENCOUNTER — Encounter (HOSPITAL_BASED_OUTPATIENT_CLINIC_OR_DEPARTMENT_OTHER): Payer: PPO

## 2014-05-07 ENCOUNTER — Encounter: Payer: Self-pay | Admitting: Family

## 2014-05-07 ENCOUNTER — Telehealth: Payer: Self-pay | Admitting: Family

## 2014-05-07 ENCOUNTER — Ambulatory Visit (INDEPENDENT_AMBULATORY_CARE_PROVIDER_SITE_OTHER): Payer: PPO | Admitting: Family

## 2014-05-07 VITALS — BP 110/78 | HR 87 | Temp 97.7°F | Resp 16 | Ht 72.0 in | Wt 264.6 lb

## 2014-05-07 DIAGNOSIS — F419 Anxiety disorder, unspecified: Secondary | ICD-10-CM

## 2014-05-07 DIAGNOSIS — I10 Essential (primary) hypertension: Secondary | ICD-10-CM

## 2014-05-07 DIAGNOSIS — R319 Hematuria, unspecified: Secondary | ICD-10-CM | POA: Diagnosis not present

## 2014-05-07 DIAGNOSIS — R634 Abnormal weight loss: Secondary | ICD-10-CM

## 2014-05-07 DIAGNOSIS — Z5900 Homelessness unspecified: Secondary | ICD-10-CM | POA: Insufficient documentation

## 2014-05-07 MED ORDER — HYDROCODONE-ACETAMINOPHEN 7.5-325 MG PO TABS: 1.0000 | ORAL_TABLET | Freq: Four times a day (QID) | ORAL | Status: DC | PRN

## 2014-05-07 MED ORDER — DIAZEPAM 10 MG PO TABS: 10.0000 mg | ORAL_TABLET | Freq: Every evening | ORAL | Status: DC | PRN

## 2014-05-07 NOTE — Assessment & Plan Note (Signed)
Patient was given info on the [employer] housing authority.  Also given number for a local shelter.  He tells me that his daughter is working on a place for him in [place] as well.

## 2014-05-07 NOTE — Progress Notes (Signed)
Pre visit review using our clinic review tool, if applicable. No additional management support is needed unless otherwise documented below in the visit note. 

## 2014-05-07 NOTE — Telephone Encounter (Signed)
Could you please contact [facility] and see if pt was seen? If so, please request consultation report. Thanks.

## 2014-05-07 NOTE — Progress Notes (Signed)
Subjective:    Patient ID: Christian Morris, male    DOB: [birth year], 74 y.o.   MRN: [id number]  HPI  Mr. Dec is a 74 yr old male who presents today for follow up.  1) HTN- Patient is currently maintained on the following medications for blood pressure: hctz and losartan (losartan was added to his regimen last visit). Patient reports good compliance with blood pressure medications. Patient denies chest pain, shortness of breath or swelling. Last 3 blood pressure readings in our office are as follows: BP Readings from Last 3 Encounters:  05/07/14 110/78  04/09/14 110/78  03/16/14 114/55   2) Weight Loss-   Wt Readings from Last 3 Encounters:  05/07/14 264 lb 9.6 oz (120.022 kg)  04/09/14 263 lb (119.296 kg)  03/16/14 272 lb (123.378 kg)   3) Hematuria- he was referred to urology back in March. Not sure if he was seen at urology.   He reports that he has been staying in a motel and his truck. His niece asked him to leave- he was staying with her previously.   Review of Systems    see HPI  Past Medical History  Diagnosis Date  . Hypertension   . Hyperlipidemia   . History of gastric ulcer ~ 1958    no recurrence since.  . Hypogonadism male 01/11/2011  . Lung nodule     right 16 mm, seen initially 6/12. 60mm in 08/2011.  [name] GERD (gastroesophageal reflux disease)   . Fatty liver disease, nonalcoholic   . Kidney stones   . Squamous cell cancer of skin of forearm 01/23/2011    left  . Depression   . Type II diabetes mellitus   . History of blood transfusion ~ 1958    "related to bleeding ulcers"  . Daily headache     "here lately" (02/21/2014)  . Arthritis     "legs" (02/21/2014)  . Chronic lower back pain   . Anxiety   . OSA on CPAP   . Urinary hesitancy     History   Social History  . Marital Status: Divorced    Spouse Name: N/A  . Number of Children: 3  . Years of Education: N/A   Occupational History  . RETIRED BUS DRIVER    Social History Main Topics  .  Smoking status: Former Smoker -- 1.00 packs/day for 25 years    Types: Cigarettes    Quit date: 01/05/1982  . Smokeless tobacco: Never Used  . Alcohol Use: Yes     Comment: "quit drinking in the 1980's"  . Drug Use: No  . Sexual Activity: Yes   Other Topics Concern  . 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    Past Surgical History  Procedure Laterality Date  . Melanoma excision Left 01/23/2011    forearm  . Left heart catheterization with coronary angiogram N/A 11/17/2012    Procedure: LEFT HEART CATHETERIZATION WITH CORONARY ANGIOGRAM;  Surgeon: Burnell Blanks, MD;  Location: [facility] CATH LAB;  Service: Cardiovascular;  Laterality: N/A;  . Cataract extraction w/ intraocular lens implant Left 08/2009    Dr Katy Fitch  . Cardiac catheterization      Family History  Problem Relation Age of Onset  . Arthritis Mother   . Heart disease Sister     Massive MI age 59.  . Arrhythmia Brother   . Melanoma Son   .  Heart attack Brother     No Known Allergies  Current Outpatient Prescriptions on File Prior to Visit  Medication Sig Dispense Refill  . aspirin EC 81 MG tablet Take 81 mg by mouth every morning.     . diazepam (VALIUM) 10 MG tablet Take 1 tablet (10 mg total) by mouth at bedtime as needed for anxiety. 30 tablet 0  . fish oil-omega-3 fatty acids 1000 MG capsule Take 2 g by mouth 2 (two) times daily.      [name] glucose blood test strip Check blood sugar daily and as needed. 100 each 12  . hydrochlorothiazide (HYDRODIURIL) 25 MG tablet Take 1 tablet (25 mg total) by mouth daily. 30 tablet 5  . HYDROcodone-acetaminophen (NORCO) 7.5-325 MG per tablet Take 1 tablet by mouth every 6 (six) hours as needed for moderate pain. 90 tablet 0  . Insulin Glargine (LANTUS SOLOSTAR) 100 UNIT/ML Solostar Pen Inject 10 Units into the skin daily at 10 pm. 15 mL 0  . losartan (COZAAR) 25 MG tablet Take 1 tablet (25 mg  total) by mouth daily. 90 tablet 1  . metFORMIN (GLUCOPHAGE) 500 MG tablet TAKE TWO TABLETS BY MOUTH TWICE DAILY WITH MEALS 120 tablet 5  . nitroGLYCERIN (NITROSTAT) 0.4 MG SL tablet Place 1 tablet (0.4 mg total) under the tongue every 5 (five) minutes x 3 doses as needed for chest pain (donot take with viagra). 60 tablet 12  . PARoxetine (PAXIL) 30 MG tablet TAKE ONE TABLET BY MOUTH EVERY MORNING 30 tablet 5  . tamsulosin (FLOMAX) 0.4 MG CAPS capsule Take 1 capsule (0.4 mg total) by mouth daily. 30 capsule 5   No current facility-administered medications on file prior to visit.    BP 110/78 mmHg  Pulse 87  Temp(Src) 97.7 F (36.5 C) (Oral)  Resp 16  Ht 6' (1.829 m)  Wt 264 lb 9.6 oz (120.022 kg)  BMI 35.88 kg/m2  SpO2 98%    Objective:   Physical Exam  Constitutional: He is oriented to person, place, and time. He appears well-developed and well-nourished. No distress.  HENT:  Head: Normocephalic and atraumatic.  Cardiovascular: Normal rate and regular rhythm.   No murmur heard. Pulmonary/Chest: Effort normal and breath sounds normal. No respiratory distress. He has no wheezes. He has no rales.  Musculoskeletal: He exhibits no edema.  Neurological: He is alert and oriented to person, place, and time.  Skin: Skin is warm and dry.  Psychiatric: His behavior is normal. Thought content normal.  tearful          Assessment & Plan:  Weight loss- weight loss has stabilized.

## 2014-05-07 NOTE — Patient Instructions (Addendum)
Please visit the [employer] on Wednesday- Applications are accepted from 9:00 am - 12:00 noon and 1:00 pm - 4:00 pm. Their address is:  [address], [place], [place] [address]  [phone]  Please bring the following with you when you apply: Birth certificate,  Social security cards   Proof of income (check stubs)  Social security / (SSI) award letter (if applicable) Picture identification for all adult members Verification of veteran status (form TK354) (if applicable)  An alternative option for you short term is: [employer]-  [address] [place], [place] [address] [phone]  Please complete lab work prior to leaving. Follow up in 3 months.

## 2014-05-07 NOTE — Assessment & Plan Note (Signed)
BP looks good with losartan back on board, continue same, obtain follow up bmet.

## 2014-05-07 NOTE — Assessment & Plan Note (Signed)
Will request consultation report from [facility].

## 2014-05-08 ENCOUNTER — Telehealth: Payer: Self-pay | Admitting: Family

## 2014-05-08 NOTE — Telephone Encounter (Signed)
Gave pt billing # to contact for below request.

## 2014-05-08 NOTE — Telephone Encounter (Signed)
Caller name: [place] Relation to pt: self Call back number: [phone] Pharmacy:  Reason for call:   Patient states that he needs a printout of last years bills from his visits.

## 2014-05-08 NOTE — Telephone Encounter (Signed)
Spoke with medical records dept at [facility]. She states that pt was seen twice in March. She will fax both visits as well as his urine culture. I asked her to fax any records she has as we have not received anything yet. Awaiting fax.

## 2014-05-09 NOTE — Telephone Encounter (Signed)
Christian Morris or Christian Morris-- have you seen this come through yet?

## 2014-05-09 NOTE — Telephone Encounter (Signed)
Not yet

## 2014-05-11 NOTE — Telephone Encounter (Signed)
Records received and forwarded to [facility]. JG//CMA

## 2014-05-13 ENCOUNTER — Telehealth: Payer: Self-pay | Admitting: Family

## 2014-05-13 DIAGNOSIS — I10 Essential (primary) hypertension: Secondary | ICD-10-CM

## 2014-05-13 NOTE — Telephone Encounter (Signed)
Please contact pt and arrange lab draw for bmet dx dm2

## 2014-05-14 ENCOUNTER — Telehealth: Payer: Self-pay | Admitting: Family

## 2014-05-14 ENCOUNTER — Other Ambulatory Visit (INDEPENDENT_AMBULATORY_CARE_PROVIDER_SITE_OTHER): Payer: PPO

## 2014-05-14 DIAGNOSIS — I10 Essential (primary) hypertension: Secondary | ICD-10-CM

## 2014-05-14 LAB — BASIC METABOLIC PANEL
BUN: 27 mg/dL — AB (ref 6–23)
CHLORIDE: 99 meq/L (ref 96–112)
CO2: 25 mEq/L (ref 19–32)
Calcium: 9.8 mg/dL (ref 8.4–10.5)
Creatinine, Ser: 1.32 mg/dL (ref 0.40–1.50)
GFR: 56.1 mL/min — ABNORMAL LOW (ref >60.00)
Glucose, Bld: 142 mg/dL — ABNORMAL HIGH (ref 70–99)
Potassium: 3.6 mEq/L (ref 3.5–5.1)
Sodium: 134 mEq/L — ABNORMAL LOW (ref 135–145)

## 2014-05-14 NOTE — Telephone Encounter (Signed)
CALLER NAME: Iona Beard REL TO PT: self PH#: PHARMACY: [facility] on [address]  REASON FOR CALL: Pt states that he needs refill on losartan (COZAAR) 25 MG tablet [[id number]]. Pt states he is taking once daily and is out of medication.

## 2014-05-14 NOTE — Telephone Encounter (Signed)
Refill sent 04/21/14. Spoke with pt. He states he got them filled but had misplaced them with his current living situation. Pt states he did not go to the housing authority and is currently staying with his son's father in law. Pt states he has found his losartan since he called [place] this morning.

## 2014-05-14 NOTE — Telephone Encounter (Signed)
Notified pt and he will return to the lab at 1:30pm today. Lab appt scheduled. Order entered.

## 2014-05-15 ENCOUNTER — Encounter: Payer: Self-pay | Admitting: Family

## 2014-05-18 ENCOUNTER — Telehealth: Payer: Self-pay | Admitting: Family

## 2014-05-18 NOTE — Telephone Encounter (Signed)
Relation to pt: self  Call back number: [phone] Pharmacy: [facility] [place], [place] [phone] (Phone) [phone] (Fax)         Reason for call:  As per pt pharmacy never received tamsulosin (FLOMAX) 0.4 MG CAPS capsule and PARoxetine (PAXIL) 30 MG tablet

## 2014-05-18 NOTE — Telephone Encounter (Signed)
Verified with pharmacy that they received all RXs in April and that the person that helped pt must have not looked back at his profile. She states Rxs will be ready for pt around 12:30pm today.  Notified pt.

## 2014-05-25 ENCOUNTER — Telehealth: Payer: Self-pay | Admitting: Family

## 2014-05-25 NOTE — Telephone Encounter (Signed)
Caller name: Pranish Akhavan Relationship to patient: self Can be reached: [phone] Pharmacy: [facility] on [address]  Reason for call: Pt asking for refill of losartan (COZAAR) 25 MG tablet. Pt taking 1/day. Pt states he's been out for 3 days. Last RX 04/17/14 sent to [facility] for 90 doses. Pt states he has been moving and may have lost it. Pt is going to call [facility] to check with them. Maybe he forgot to get it.

## 2014-05-25 NOTE — Telephone Encounter (Signed)
Spoke with pharmacist. States they have never filled losartan for pt before. They received Rx from 04/2014 and placed it on file. I asked that they please fill Rx for pt. Notified pt.

## 2014-05-28 ENCOUNTER — Telehealth: Payer: Self-pay | Admitting: Family

## 2014-05-28 MED ORDER — HYDROCODONE-ACETAMINOPHEN 7.5-325 MG PO TABS: 1.0000 | ORAL_TABLET | Freq: Four times a day (QID) | ORAL | Status: DC | PRN

## 2014-05-28 NOTE — Telephone Encounter (Signed)
Rx signed and placed at front desk for pick up.  Notified pt.

## 2014-05-28 NOTE — Telephone Encounter (Signed)
Relation to pt: self  Call back number: [phone]   Reason for call:  Pt requesting a refill of pain medication (pt did not know the name of RX) pt experencing back pain.

## 2014-05-28 NOTE — Telephone Encounter (Signed)
HYDROcodone-acetaminophen (NORCO) 7.5-325 MG per tablet [[id number]]         Pharmacy:  [facility], [place]        Pt called back and states the name of pain meds is "loratab" but then stated hydrocodone. Please call pt once RX is ready.

## 2014-05-28 NOTE — Telephone Encounter (Signed)
Last hydrocodone Rx 05/07/14, #90. CSC and UDS on file from 10/2013. Rx printed and forwarded to Provider for signature.

## 2014-06-05 ENCOUNTER — Telehealth: Payer: Self-pay | Admitting: Family

## 2014-06-05 NOTE — Telephone Encounter (Signed)
Caller name: Kalob Relation to pt: self Call back number: [phone] Pharmacy:  Reason for call:   Patient states that he has been very sleepy all of the time and is wanting to know which medication could be doing this to him

## 2014-06-06 NOTE — Telephone Encounter (Signed)
Paxil, norco and ambien all have potential to make him sleepy.  Lets see him back in the office please.

## 2014-06-06 NOTE — Telephone Encounter (Signed)
Notified pt and scheduled appt for tomorrow at 11am.

## 2014-06-07 ENCOUNTER — Encounter: Payer: Self-pay | Admitting: Family

## 2014-06-07 ENCOUNTER — Ambulatory Visit (INDEPENDENT_AMBULATORY_CARE_PROVIDER_SITE_OTHER): Payer: PPO | Admitting: Family

## 2014-06-07 VITALS — BP 100/60 | HR 60 | Temp 97.8°F | Resp 16 | Ht 72.0 in | Wt 269.6 lb

## 2014-06-07 DIAGNOSIS — R3911 Hesitancy of micturition: Secondary | ICD-10-CM | POA: Diagnosis not present

## 2014-06-07 DIAGNOSIS — R400 Somnolence: Secondary | ICD-10-CM

## 2014-06-07 DIAGNOSIS — G4733 Obstructive sleep apnea (adult) (pediatric): Secondary | ICD-10-CM | POA: Diagnosis not present

## 2014-06-07 LAB — CBC WITH DIFFERENTIAL/PLATELET
BASOS ABS: 0.1 10*3/uL (ref 0.0–0.1)
Basophils Relative: 0.8 % (ref 0.0–3.0)
Eosinophils Absolute: 0.2 10*3/uL (ref 0.0–0.7)
Eosinophils Relative: 1.9 % (ref 0.0–5.0)
HEMATOCRIT: 42 % (ref 39.0–52.0)
Hemoglobin: 13.6 g/dL (ref 13.0–17.0)
LYMPHS PCT: 28.3 % (ref 12.0–46.0)
Lymphs Abs: 2.9 10*3/uL (ref 0.7–4.0)
MCHC: 32.5 g/dL (ref 30.0–36.0)
MCV: 87.7 fl (ref 78.0–100.0)
Monocytes Absolute: 0.6 10*3/uL (ref 0.1–1.0)
Monocytes Relative: 6.3 % (ref 3.0–12.0)
Neutro Abs: 6.4 10*3/uL (ref 1.4–7.7)
Neutrophils Relative %: 62.7 % (ref 43.0–77.0)
Platelets: 270 10*3/uL (ref 150.0–400.0)
RBC: 4.8 Mil/uL (ref 4.22–5.81)
RDW: 14.1 % (ref 11.5–15.5)
WBC: 10.2 10*3/uL (ref 4.0–10.5)

## 2014-06-07 LAB — TSH: TSH: 0.5 u[IU]/mL (ref 0.35–4.50)

## 2014-06-07 NOTE — Progress Notes (Signed)
Subjective:    Patient ID: Christian Morris, male    DOB: 1940/06/04, 74 y.o.   MRN: [id number]  HPI   Christian Morris is a 74 yr old male who presents today with chief complaint of sleepiness. Reports that he has had increased sleepiness x 6 months despite compliance with CPAP.  Reports that his mask is fitting well.  Reports that he is sleeping well- goes to bed at 8pm and wakes at around 9AM.  Naps for 2-3 hrs most days.  Does not feel more sleepy after norco. Does not use valium every day.  Spent some time with friends yesterday afternoon and "didn't feel tired at all."  Reports mood is good. Right now he is staying with his son's father-in-law. Trying to work on a permanent housing situation and this has been stressful for him.  Son lives in [place] and his daughter lives in [place]. He also has a son [place]- but he is estranged from him.   Review of Systems    see HPI  Past Medical History  Diagnosis Date  . Hypertension   . Hyperlipidemia   . History of gastric ulcer ~ 1958    no recurrence since.  . Hypogonadism male 01/11/2011  . Lung nodule     right 16 mm, seen initially 6/12. 31mm in 08/2011.  [name] GERD (gastroesophageal reflux disease)   . Fatty liver disease, nonalcoholic   . Kidney stones   . Squamous cell cancer of skin of forearm 01/23/2011    left  . Depression   . Type II diabetes mellitus   . History of blood transfusion ~ 1958    "related to bleeding ulcers"  . Daily headache     "here lately" (02/21/2014)  . Arthritis     "legs" (02/21/2014)  . Chronic lower back pain   . Anxiety   . OSA on CPAP   . Urinary hesitancy     History   Social History  . Marital Status: Divorced    Spouse Name: N/A  . Number of Children: 3  . Years of Education: N/A   Occupational History  . RETIRED BUS DRIVER    Social History Main Topics  . Smoking status: Former Smoker -- 1.00 packs/day for 25 years    Types: Cigarettes    Quit date: 01/05/1982  . Smokeless tobacco:  Never Used  . Alcohol Use: Yes     Comment: "quit drinking in the 1980's"  . Drug Use: No  . Sexual Activity: Yes   Other Topics Concern  . 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    Past Surgical History  Procedure Laterality Date  . Melanoma excision Left 01/23/2011    forearm  . Left heart catheterization with coronary angiogram N/A 11/17/2012    Procedure: LEFT HEART CATHETERIZATION WITH CORONARY ANGIOGRAM;  Surgeon: Burnell Blanks, MD;  Location: [facility] CATH LAB;  Service: Cardiovascular;  Laterality: N/A;  . Cataract extraction w/ intraocular lens implant Left 08/2009    Dr Katy Fitch  . Cardiac catheterization      Family History  Problem Relation Age of Onset  . Arthritis Mother   . Heart disease Sister     Massive MI age 54.  . Arrhythmia Brother   . Melanoma Son   . Heart attack Brother     No Known Allergies  Current Outpatient Prescriptions on File  Prior to Visit  Medication Sig Dispense Refill  . aspirin EC 81 MG tablet Take 81 mg by mouth every morning.     [name] atorvastatin (LIPITOR) 40 MG tablet Take 40 mg by mouth daily.  0  . diazepam (VALIUM) 10 MG tablet Take 1 tablet (10 mg total) by mouth at bedtime as needed for anxiety. 30 tablet 0  . fish oil-omega-3 fatty acids 1000 MG capsule Take 2 g by mouth 2 (two) times daily.      [name] glucose blood test strip Check blood sugar daily and as needed. 100 each 12  . hydrochlorothiazide (HYDRODIURIL) 25 MG tablet Take 1 tablet (25 mg total) by mouth daily. 30 tablet 5  . HYDROcodone-acetaminophen (NORCO) 7.5-325 MG per tablet Take 1 tablet by mouth every 6 (six) hours as needed for moderate pain. 90 tablet 0  . Insulin Glargine (LANTUS SOLOSTAR) 100 UNIT/ML Solostar Pen Inject 10 Units into the skin daily at 10 pm. 15 mL 0  . losartan (COZAAR) 25 MG tablet Take 1 tablet (25 mg total) by mouth daily. 90 tablet 1  . metFORMIN (GLUCOPHAGE)  500 MG tablet TAKE TWO TABLETS BY MOUTH TWICE DAILY WITH MEALS 120 tablet 5  . nitroGLYCERIN (NITROSTAT) 0.4 MG SL tablet Place 1 tablet (0.4 mg total) under the tongue every 5 (five) minutes x 3 doses as needed for chest pain (donot take with viagra). 60 tablet 12  . PARoxetine (PAXIL) 30 MG tablet TAKE ONE TABLET BY MOUTH EVERY MORNING 30 tablet 5  . tamsulosin (FLOMAX) 0.4 MG CAPS capsule Take 1 capsule (0.4 mg total) by mouth daily. 30 capsule 5   No current facility-administered medications on file prior to visit.    BP 100/60 mmHg  Pulse 60  Temp(Src) 97.8 F (36.6 C) (Oral)  Resp 16  Ht 6' (1.829 m)  Wt 269 lb 9.6 oz (122.29 kg)  BMI 36.56 kg/m2  SpO2 96%    Objective:   Physical Exam  Constitutional: He is oriented to person, place, and time. He appears well-developed and well-nourished. No distress.  HENT:  Head: Normocephalic and atraumatic.  Cardiovascular: Normal rate and regular rhythm.   No murmur heard. Pulmonary/Chest: Effort normal and breath sounds normal. No respiratory distress. He has no wheezes. He has no rales.  Musculoskeletal: He exhibits no edema.  Neurological: He is alert and oriented to person, place, and time.  Skin: Skin is warm and dry.  Psychiatric: He has a normal mood and affect. His behavior is normal. Thought content normal.          Assessment & Plan:

## 2014-06-07 NOTE — Assessment & Plan Note (Addendum)
Will arrange follow up with Dr. Gwenette Greet for re-evaluation of his OSA treatment.  I think that stress and loneliness is playing a role in his sleepiness.  His meds certainly could be sedating but he has been on these meds for many years without any problem. Will obtain baseline labs as listed below to further evaluate. Pt advised not to drive if sleepy.  We tried about trying to stay more active and resume his daily walking which really helped him previously

## 2014-06-07 NOTE — Progress Notes (Signed)
Pre visit review using our clinic review tool, if applicable. No additional management support is needed unless otherwise documented below in the visit note. 

## 2014-06-07 NOTE — Patient Instructions (Addendum)
Please complete lab work prior to leaving.  You will be contacted about your referral to Dr. Gwenette Greet to re-evaluate your sleep apnea.  Try to stay active and walk every day. Avoid driving if sleepy. Follow up in August as scheduled.

## 2014-06-10 LAB — URINE CULTURE: Colony Count: 30000

## 2014-06-11 ENCOUNTER — Other Ambulatory Visit: Payer: Self-pay | Admitting: Family

## 2014-06-11 MED ORDER — CIPROFLOXACIN HCL 250 MG PO TABS: 250.0000 mg | ORAL_TABLET | Freq: Two times a day (BID) | ORAL | Status: DC

## 2014-06-11 NOTE — Telephone Encounter (Signed)
Notified pt, Rx sent

## 2014-06-11 NOTE — Telephone Encounter (Signed)
Urine culture + for bacteria.  Start cipro as below.

## 2014-06-25 ENCOUNTER — Telehealth: Payer: Self-pay | Admitting: Family

## 2014-06-25 MED ORDER — HYDROCODONE-ACETAMINOPHEN 7.5-325 MG PO TABS
1.0000 | ORAL_TABLET | Freq: Four times a day (QID) | ORAL | Status: DC | PRN
Start: 2014-06-25 — End: 2014-07-23

## 2014-06-25 NOTE — Telephone Encounter (Signed)
Rx printed and forwarded to Provider for signature.

## 2014-06-25 NOTE — Telephone Encounter (Signed)
Caller name: [place] Relation to pt: self Call back number: [phone] Pharmacy:  Reason for call:   Requesting refills of pain medication

## 2014-06-25 NOTE — Telephone Encounter (Signed)
Rx signed and placed at front desk for pick up.  Pt notified.

## 2014-06-29 ENCOUNTER — Telehealth: Payer: Self-pay | Admitting: Family

## 2014-06-29 ENCOUNTER — Encounter: Payer: Self-pay | Admitting: Internal Medicine

## 2014-06-29 ENCOUNTER — Ambulatory Visit (INDEPENDENT_AMBULATORY_CARE_PROVIDER_SITE_OTHER): Payer: PPO | Admitting: Internal Medicine

## 2014-06-29 VITALS — BP 138/90 | HR 93 | Temp 97.6°F | Ht 72.0 in | Wt 273.1 lb

## 2014-06-29 DIAGNOSIS — R42 Dizziness and giddiness: Secondary | ICD-10-CM | POA: Diagnosis not present

## 2014-06-29 DIAGNOSIS — E118 Type 2 diabetes mellitus with unspecified complications: Secondary | ICD-10-CM

## 2014-06-29 LAB — GLUCOSE, POCT (MANUAL RESULT ENTRY): POC Glucose: 183 mg/dl — AB (ref 70–99)

## 2014-06-29 NOTE — Patient Instructions (Addendum)
Decrease Lantus to 5 units every night  Keep herself hydrated  Call anytime if the symptoms get worse, different or if you have any strokelike symptoms such as difficulty with your walk, slurred speech, double vision.  See Mrs  Edwena Blow in few weeks as planned

## 2014-06-29 NOTE — Telephone Encounter (Signed)
Appointment with Dr. Larose Kells.  Chart routed to Dr. Larose Kells and Vilma Prader, [place].

## 2014-06-29 NOTE — Telephone Encounter (Signed)
[facility] [place] [facility] - Client TELEPHONE ADVICE RECORD [facility] Patient Name: Christian Morris DOB: [birth year] Initial Comment Caller states pt c/o high BP and dizziness, has been dizzy every day for over a week; on BP meds but no cuff; Nurse Assessment Nurse: Erlene Quan, RN, Manuela Schwartz Date/Time Eilene Ghazi Time): 06/29/2014 8:47:19 AM Confirm and document reason for call. If symptomatic, describe symptoms. ---Caller states pt c/o high BP and dizziness, has been dizzy every day for over a week; on BP meds but no cuff to check his BP - states he is a diabetic also and he has forgotten to take his insulin for a few days the last time he took his blood sugar is was 142 that was several days ago - can not find his strips - states after he eats he feels better the dizziness goes away , no headaches - no shortness of breath - denies any vomiting or diarrhea just dizzy in am when he first gets up Has the patient traveled out of the country within the last 30 days? ---Not Applicable Does the patient require triage? ---Yes Related visit to physician within the last 2 weeks? ---No Does the PT have any chronic conditions? (i.e. diabetes, asthma, etc.) ---Yes List chronic conditions. ---high blood pressure diabetic see EMR for history Guidelines Guideline Title Affirmed Question Affirmed Notes Dizziness - Lightheadedness [1] MODERATE dizziness (e.g., interferes with normal activities) AND [2] has NOT been evaluated by physician for this (Exception: dizziness caused by heat exposure, sudden standing, or poor fluid intake) Final Disposition User See Physician within [place], RN, Manuela Schwartz Comments caller states he can just wait for Melissa to get back and come in , advised him he needs to be seen today and not wait - he agreed to see another provider in the office - appointment made for 11 am today with Dr Larose Kells

## 2014-06-29 NOTE — Telephone Encounter (Signed)
Noted  

## 2014-06-29 NOTE — Progress Notes (Signed)
Subjective:    Patient ID: Christian Morris, male    DOB: [birth year], 74 y.o.   MRN: [id number]  DOS:  06/29/2014 Type of visit - description : Acute Interval history: One-month history of dizziness, reports that immediately after he get up from bed in the morning he gets dizzy, he described sx as a spinning, lasts a few minutes and then symptoms subside. No associated nausea. Symptoms improve even before he gets his breakfast Diabetes--- has not been taking his insulin regularly, this morning he did use Lantus 10 units, then ate, CBG at the time of visit 183.   Review of Systems   no chest pain, difficulty breathing or palpitations No nausea, vomiting, diarrhea blood in the stools No headaches, diplopia, motor deficits, facial numbness, slurred speech.  Past Medical History  Diagnosis Date  . Hypertension   . Hyperlipidemia   . History of gastric ulcer ~ 1958    no recurrence since.  . Hypogonadism male 01/11/2011  . Lung nodule     right 16 mm, seen initially 6/12. 76mm in 08/2011.  [name] GERD (gastroesophageal reflux disease)   . Fatty liver disease, nonalcoholic   . Kidney stones   . Squamous cell cancer of skin of forearm 01/23/2011    left  . Depression   . Type II diabetes mellitus   . History of blood transfusion ~ 1958    "related to bleeding ulcers"  . Daily headache     "here lately" (02/21/2014)  . Arthritis     "legs" (02/21/2014)  . Chronic lower back pain   . Anxiety   . OSA on CPAP   . Urinary hesitancy     Past Surgical History  Procedure Laterality Date  . Melanoma excision Left 01/23/2011    forearm  . Left heart catheterization with coronary angiogram N/A 11/17/2012    Procedure: LEFT HEART CATHETERIZATION WITH CORONARY ANGIOGRAM;  Surgeon: Burnell Blanks, MD;  Location: [facility] CATH LAB;  Service: Cardiovascular;  Laterality: N/A;  . Cataract extraction w/ intraocular lens implant Left 08/2009    Dr Katy Fitch  . Cardiac catheterization      History    Social History  . Marital Status: Divorced    Spouse Name: N/A  . Number of Children: 3  . Years of Education: N/A   Occupational History  . RETIRED BUS DRIVER    Social History Main Topics  . Smoking status: Former Smoker -- 1.00 packs/day for 25 years    Types: Cigarettes    Quit date: 01/05/1982  . Smokeless tobacco: Never Used  . Alcohol Use: Yes     Comment: "quit drinking in the 1980's"  . Drug Use: No  . Sexual Activity: Yes   Other Topics Concern  . 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        Medication List       This list is accurate as of: 06/29/14 11:59 PM.  Always use your most recent med list.               aspirin EC 81 MG tablet  Take 81 mg by mouth every morning.     atorvastatin 40 MG tablet  Commonly known as:  LIPITOR  Take 40 mg by mouth daily.     diazepam 10 MG tablet  Commonly known as:  VALIUM  Take 1 tablet (10 mg  total) by mouth at bedtime as needed for anxiety.     fish oil-omega-3 fatty acids 1000 MG capsule  Take 2 g by mouth 2 (two) times daily.     glucose blood test strip  Check blood sugar daily and as needed.     hydrochlorothiazide 25 MG tablet  Commonly known as:  HYDRODIURIL  Take 1 tablet (25 mg total) by mouth daily.     HYDROcodone-acetaminophen 7.5-325 MG per tablet  Commonly known as:  NORCO  Take 1 tablet by mouth every 6 (six) hours as needed for moderate pain.     insulin glargine 100 UNIT/ML injection  Commonly known as:  LANTUS  Inject 5 Units into the skin at bedtime.     losartan 25 MG tablet  Commonly known as:  COZAAR  Take 1 tablet (25 mg total) by mouth daily.     metFORMIN 500 MG tablet  Commonly known as:  GLUCOPHAGE  TAKE TWO TABLETS BY MOUTH TWICE DAILY WITH MEALS     nitroGLYCERIN 0.4 MG SL tablet  Commonly known as:  NITROSTAT  Place 1 tablet (0.4 mg total) under the tongue every 5 (five)  minutes x 3 doses as needed for chest pain (donot take with viagra).     PARoxetine 30 MG tablet  Commonly known as:  PAXIL  TAKE ONE TABLET BY MOUTH EVERY MORNING     tamsulosin 0.4 MG Caps capsule  Commonly known as:  FLOMAX  Take 1 capsule (0.4 mg total) by mouth daily.           Objective:   Physical Exam BP 138/90 mmHg  Pulse 93  Temp(Src) 97.6 F (36.4 C) (Oral)  Ht 6' (1.829 m)  Wt 273 lb 2 oz (123.889 kg)  BMI 37.03 kg/m2  SpO2 97%  General:   Well developed, well nourished . NAD.  HEENT:  Normocephalic . Face symmetric, atraumatic Neck: Good carotid pulses Lungs:  CTA B Normal respiratory effort, no intercostal retractions, no accessory muscle use. Heart: RRR,  no murmur.  No pretibial edema bilaterally  Skin: Not pale. Not jaundice Neurologic:  alert & oriented X3.  Speech normal, gait appropriate for age and unassisted EOMI, pupils equal and reactive. Gait is slightly wide gait but otherwise normal Psych--  Cognition and judgment appear intact.  Cooperative with normal attention span and concentration.  Behavior appropriate. No anxious or depressed appearing.       Assessment & Plan:    dizziness, 74 year old gentleman presents with dizziness for a month, description fits better a peripheral rather than a central issue. He has well-controlled diabetes, recent labs showed a normal hemoglobin. Given description of symptoms I don't think immediate workup is needed, I recommended good hydration, get up from bed slowly and call if he is not improving or if symptoms get worse. Interestingly, the patient reports that for the last few days he has been getting out of bed slowly and he has gotten less dizzy.  Diabetes, Currently on 10 units of Lantus, symptoms could also be related to low blood sugar early in the morning Plan: decreased to 5 units.

## 2014-06-29 NOTE — Progress Notes (Signed)
Pre visit review using our clinic review tool, if applicable. No additional management support is needed unless otherwise documented below in the visit note. 

## 2014-07-19 ENCOUNTER — Telehealth: Payer: Self-pay | Admitting: Family

## 2014-07-19 DIAGNOSIS — F419 Anxiety disorder, unspecified: Secondary | ICD-10-CM

## 2014-07-19 MED ORDER — ATORVASTATIN CALCIUM 40 MG PO TABS
40.0000 mg | ORAL_TABLET | Freq: Every day | ORAL | Status: DC
Start: 2014-07-19 — End: 2014-08-17

## 2014-07-19 MED ORDER — DIAZEPAM 10 MG PO TABS: 10.0000 mg | ORAL_TABLET | Freq: Every evening | ORAL | Status: DC | PRN

## 2014-07-19 NOTE — Telephone Encounter (Signed)
Atorvastatin refill sent to pharmacy. Diazepam rx printed and forwarded to PCP for signature.  Will hold note open until next week when Hydrocodone will be due.

## 2014-07-19 NOTE — Telephone Encounter (Signed)
Notified pt and instructed him to call pharmacy for other refills needed as all other RXs have refills remaining at the pharmacy.  Pt voices understanding. Will contact pt Monday or Tuesday when Hydrocodone Rx is ready for pick up.

## 2014-07-19 NOTE — Telephone Encounter (Signed)
Caller name: Christian Morris Relationship to patient: self  Reason for call: Pt called for refills on "everything". I advised pt he had refills on most of his meds and to call the pharmacy. Pt will need atorvastatin, diazepam, and hydrocodone. Pt has enough hydrocodone thru the weekend but will need the first of next week. Please call him when the RX is ready for pick up.

## 2014-07-23 MED ORDER — HYDROCODONE-ACETAMINOPHEN 7.5-325 MG PO TABS: 1.0000 | ORAL_TABLET | Freq: Four times a day (QID) | ORAL | Status: DC | PRN

## 2014-07-23 NOTE — Telephone Encounter (Signed)
Last hydrocodone RX 06/25/14.  CSC and UDS on file from 10/2013.  Rx printed and forwarded to Provider for signature.

## 2014-07-24 NOTE — Telephone Encounter (Signed)
Rx placed at front desk for pick up and pt has been notified. 

## 2014-08-10 ENCOUNTER — Telehealth: Payer: Self-pay | Admitting: Family

## 2014-08-10 ENCOUNTER — Ambulatory Visit (INDEPENDENT_AMBULATORY_CARE_PROVIDER_SITE_OTHER): Payer: PPO | Admitting: Family

## 2014-08-10 ENCOUNTER — Ambulatory Visit (HOSPITAL_BASED_OUTPATIENT_CLINIC_OR_DEPARTMENT_OTHER)
Admission: RE | Admit: 2014-08-10 | Discharge: 2014-08-10 | Disposition: A | Discharge status: Home / Self Care | Payer: PPO | Source: Ambulatory Visit | Attending: Family | Admitting: Family

## 2014-08-10 ENCOUNTER — Encounter: Payer: Self-pay | Admitting: Family

## 2014-08-10 VITALS — BP 110/64 | HR 47 | Temp 97.5°F | Resp 16 | Ht 72.0 in | Wt 275.6 lb

## 2014-08-10 DIAGNOSIS — M542 Cervicalgia: Secondary | ICD-10-CM

## 2014-08-10 DIAGNOSIS — R05 Cough: Secondary | ICD-10-CM

## 2014-08-10 DIAGNOSIS — W19XXXA Unspecified fall, initial encounter: Secondary | ICD-10-CM | POA: Insufficient documentation

## 2014-08-10 DIAGNOSIS — R059 Cough, unspecified: Secondary | ICD-10-CM | POA: Insufficient documentation

## 2014-08-10 DIAGNOSIS — I517 Cardiomegaly: Secondary | ICD-10-CM | POA: Insufficient documentation

## 2014-08-10 DIAGNOSIS — M8580 Other specified disorders of bone density and structure, unspecified site: Secondary | ICD-10-CM | POA: Diagnosis not present

## 2014-08-10 DIAGNOSIS — M4186 Other forms of scoliosis, lumbar region: Secondary | ICD-10-CM | POA: Diagnosis not present

## 2014-08-10 DIAGNOSIS — M5489 Other dorsalgia: Secondary | ICD-10-CM

## 2014-08-10 DIAGNOSIS — M549 Dorsalgia, unspecified: Secondary | ICD-10-CM | POA: Insufficient documentation

## 2014-08-10 DIAGNOSIS — M5032 Other cervical disc degeneration, mid-cervical region: Secondary | ICD-10-CM | POA: Insufficient documentation

## 2014-08-10 DIAGNOSIS — M4317 Spondylolisthesis, lumbosacral region: Secondary | ICD-10-CM | POA: Insufficient documentation

## 2014-08-10 LAB — URINALYSIS, ROUTINE W REFLEX MICROSCOPIC
BILIRUBIN URINE: NEGATIVE
Hgb urine dipstick: NEGATIVE
Ketones, ur: NEGATIVE
Leukocytes, UA: NEGATIVE
NITRITE: NEGATIVE
SPECIFIC GRAVITY, URINE: 1.025 (ref 1.000–1.030)
Urine Glucose: NEGATIVE
Urobilinogen, UA: 0.2 (ref 0.0–1.0)
pH: 5.5 (ref 5.0–8.0)

## 2014-08-10 NOTE — Progress Notes (Signed)
Pre visit review using our clinic review tool, if applicable. No additional management support is needed unless otherwise documented below in the visit note. 

## 2014-08-10 NOTE — Assessment & Plan Note (Signed)
Will obtain x ray of c spine, thoracic spine and lumbar spine.

## 2014-08-10 NOTE — Patient Instructions (Addendum)
Please complete x rays on the first floor. Please complete lab work prior to leaving.  Call if cough/nasal congestion worsens or if not improved in 3 days. Start mucinex 600mg  twice daily.

## 2014-08-10 NOTE — Progress Notes (Signed)
Subjective:    Patient ID: Christian Morris, male    DOB: 1940-07-19, 74 y.o.   MRN: [id number]  HPI  Christian Morris is a 74 yr old male who presents with several complaints.  Cough/congestion-  Has some chest and sinus congestion.  Reports that he developed cough 5 days ago.  Reports cough is productive of white sputum.  Nasal drainage is also white in color.  Denies fever.  Has had some intermittent nausea for several weeks.    Fell Wednesday in [facility] bathroom.  Reports that his feet slipped from under him. Did not hit his head but he did fall onto his back. Reports neck and low back have been hurting- since yesterday. Did not sleep due to the pain.  He continues the norco but reports that this is not helping his pain.     Review of Systems See HPI  Past Medical History  Diagnosis Date  . Hypertension   . Hyperlipidemia   . History of gastric ulcer ~ 1958    no recurrence since.  . Hypogonadism male 01/11/2011  . Lung nodule     right 16 mm, seen initially 6/12. 107mm in 08/2011.  [name] GERD (gastroesophageal reflux disease)   . Fatty liver disease, nonalcoholic   . Kidney stones   . Squamous cell cancer of skin of forearm 01/23/2011    left  . Depression   . Type II diabetes mellitus   . History of blood transfusion ~ 1958    "related to bleeding ulcers"  . Daily headache     "here lately" (02/21/2014)  . Arthritis     "legs" (02/21/2014)  . Chronic lower back pain   . Anxiety   . OSA on CPAP   . Urinary hesitancy     History   Social History  . Marital Status: Divorced    Spouse Name: N/A  . Number of Children: 3  . Years of Education: N/A   Occupational History  . RETIRED BUS DRIVER    Social History Main Topics  . Smoking status: Former Smoker -- 1.00 packs/day for 25 years    Types: Cigarettes    Quit date: 01/05/1982  . Smokeless tobacco: Never Used  . Alcohol Use: Yes     Comment: "quit drinking in the 1980's"  . Drug Use: No  . Sexual Activity: Yes    Other Topics Concern  . 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    Past Surgical History  Procedure Laterality Date  . Melanoma excision Left 01/23/2011    forearm  . Left heart catheterization with coronary angiogram N/A 11/17/2012    Procedure: LEFT HEART CATHETERIZATION WITH CORONARY ANGIOGRAM;  Surgeon: Burnell Blanks, MD;  Location: [facility] CATH LAB;  Service: Cardiovascular;  Laterality: N/A;  . Cataract extraction w/ intraocular lens implant Left 08/2009    Dr Katy Fitch  . Cardiac catheterization      Family History  Problem Relation Age of Onset  . Arthritis Mother   . Heart disease Sister     Massive MI age 30.  . Arrhythmia Brother   . Melanoma Son   . Heart attack Brother     No Known Allergies  Current Outpatient Prescriptions on File Prior to Visit  Medication Sig Dispense Refill  . aspirin EC 81 MG tablet Take 81 mg by mouth every morning.     [name]  atorvastatin (LIPITOR) 40 MG tablet Take 1 tablet (40 mg total) by mouth daily. 30 tablet 5  . diazepam (VALIUM) 10 MG tablet Take 1 tablet (10 mg total) by mouth at bedtime as needed for anxiety. 30 tablet 0  . fish oil-omega-3 fatty acids 1000 MG capsule Take 2 g by mouth 2 (two) times daily.      [name] glucose blood test strip Check blood sugar daily and as needed. 100 each 12  . hydrochlorothiazide (HYDRODIURIL) 25 MG tablet Take 1 tablet (25 mg total) by mouth daily. 30 tablet 5  . HYDROcodone-acetaminophen (NORCO) 7.5-325 MG per tablet Take 1 tablet by mouth every 6 (six) hours as needed for moderate pain. 90 tablet 0  . insulin glargine (LANTUS) 100 UNIT/ML injection Inject 5 Units into the skin at bedtime.    [name] losartan (COZAAR) 25 MG tablet Take 1 tablet (25 mg total) by mouth daily. 90 tablet 1  . metFORMIN (GLUCOPHAGE) 500 MG tablet TAKE TWO TABLETS BY MOUTH TWICE DAILY WITH MEALS 120 tablet 5  . nitroGLYCERIN (NITROSTAT) 0.4 MG  SL tablet Place 1 tablet (0.4 mg total) under the tongue every 5 (five) minutes x 3 doses as needed for chest pain (donot take with viagra). 60 tablet 12  . PARoxetine (PAXIL) 30 MG tablet TAKE ONE TABLET BY MOUTH EVERY MORNING 30 tablet 5  . tamsulosin (FLOMAX) 0.4 MG CAPS capsule Take 1 capsule (0.4 mg total) by mouth daily. 30 capsule 5   No current facility-administered medications on file prior to visit.    BP 110/64 mmHg  Pulse 47  Temp(Src) 97.5 F (36.4 C) (Oral)  Resp 16  Ht 6' (1.829 m)  Wt 275 lb 9.6 oz (125.011 kg)  BMI 37.37 kg/m2  SpO2 97%       Objective:   Physical Exam  Constitutional: He is oriented to person, place, and time. He appears well-developed and well-nourished. No distress.  HENT:  Head: Normocephalic and atraumatic.  Cardiovascular: Normal rate and regular rhythm.   No murmur heard. Pulmonary/Chest: Effort normal and breath sounds normal. No respiratory distress. He has no wheezes. He has no rales.  Musculoskeletal: He exhibits no edema.  Neurological: He is alert and oriented to person, place, and time.  Skin: Skin is warm and dry.  Psychiatric: He has a normal mood and affect. His behavior is normal. Thought content normal.          Assessment & Plan:

## 2014-08-10 NOTE — Assessment & Plan Note (Signed)
Obtain x ray to exclude pneumonia. Advise trial of mucinex to help with sinus congestion and chest congestion. Advised pt to call me if symptoms have not improved by Monday.

## 2014-08-10 NOTE — Telephone Encounter (Signed)
Notified pt and he voices understanding. 

## 2014-08-10 NOTE — Telephone Encounter (Signed)
Please let pt know that x ray neg for fracture in spine. CXR negative for pneumonia. Let me know if his cough, neck or back pain worsens or does not improve.

## 2014-08-12 ENCOUNTER — Encounter: Payer: Self-pay | Admitting: Family

## 2014-08-12 LAB — URINE CULTURE
Colony Count: NO GROWTH
ORGANISM ID, BACTERIA: NO GROWTH

## 2014-08-17 ENCOUNTER — Ambulatory Visit (INDEPENDENT_AMBULATORY_CARE_PROVIDER_SITE_OTHER): Payer: PPO | Admitting: Family

## 2014-08-17 ENCOUNTER — Encounter: Payer: Self-pay | Admitting: Family

## 2014-08-17 VITALS — BP 122/68 | HR 87 | Temp 97.7°F | Ht 72.0 in | Wt 278.6 lb

## 2014-08-17 DIAGNOSIS — E119 Type 2 diabetes mellitus without complications: Secondary | ICD-10-CM

## 2014-08-17 DIAGNOSIS — Z5900 Homelessness unspecified: Secondary | ICD-10-CM

## 2014-08-17 DIAGNOSIS — F419 Anxiety disorder, unspecified: Secondary | ICD-10-CM | POA: Diagnosis not present

## 2014-08-17 DIAGNOSIS — I10 Essential (primary) hypertension: Secondary | ICD-10-CM | POA: Diagnosis not present

## 2014-08-17 DIAGNOSIS — F341 Dysthymic disorder: Secondary | ICD-10-CM

## 2014-08-17 DIAGNOSIS — E785 Hyperlipidemia, unspecified: Secondary | ICD-10-CM

## 2014-08-17 LAB — BASIC METABOLIC PANEL
BUN: 31 mg/dL — AB (ref 6–23)
CALCIUM: 9.7 mg/dL (ref 8.4–10.5)
CO2: 26 meq/L (ref 19–32)
CREATININE: 1.18 mg/dL (ref 0.40–1.50)
Chloride: 101 mEq/L (ref 96–112)
GFR: 63.8 mL/min (ref >60.00)
GLUCOSE: 173 mg/dL — AB (ref 70–99)
POTASSIUM: 3.7 meq/L (ref 3.5–5.1)
Sodium: 138 mEq/L (ref 135–145)

## 2014-08-17 LAB — LIPID PANEL
CHOL/HDL RATIO: 4
CHOLESTEROL: 140 mg/dL (ref 0–200)
HDL: 37.3 mg/dL — ABNORMAL LOW (ref >39.00)
LDL Cholesterol: 66 mg/dL (ref 0–99)
NonHDL: 102.95
TRIGLYCERIDES: 184 mg/dL — AB (ref 0.0–149.0)
VLDL: 36.8 mg/dL (ref 0.0–40.0)

## 2014-08-17 LAB — MICROALBUMIN / CREATININE URINE RATIO
CREATININE, U: 150.2 mg/dL
Microalb Creat Ratio: 7.6 mg/g (ref 0.0–30.0)
Microalb, Ur: 11.4 mg/dL — ABNORMAL HIGH (ref 0.0–1.9)

## 2014-08-17 LAB — HEMOGLOBIN A1C: Hgb A1c MFr Bld: 6.5 % (ref 4.6–6.5)

## 2014-08-17 MED ORDER — TAMSULOSIN HCL 0.4 MG PO CAPS: 0.4000 mg | ORAL_CAPSULE | Freq: Every day | ORAL | Status: DC

## 2014-08-17 MED ORDER — HYDROCODONE-ACETAMINOPHEN 7.5-325 MG PO TABS: 1.0000 | ORAL_TABLET | Freq: Four times a day (QID) | ORAL | Status: DC | PRN

## 2014-08-17 MED ORDER — ATORVASTATIN CALCIUM 40 MG PO TABS: 40.0000 mg | ORAL_TABLET | Freq: Every day | ORAL | Status: DC

## 2014-08-17 MED ORDER — LOSARTAN POTASSIUM 25 MG PO TABS: 25.0000 mg | ORAL_TABLET | Freq: Every day | ORAL | Status: DC

## 2014-08-17 MED ORDER — DIAZEPAM 10 MG PO TABS: 10.0000 mg | ORAL_TABLET | Freq: Every evening | ORAL | Status: DC | PRN

## 2014-08-17 MED ORDER — INSULIN GLARGINE 100 UNIT/ML ~~LOC~~ SOLN
5.0000 [IU] | Freq: Every day | SUBCUTANEOUS | Status: DC
Start: 2014-08-17 — End: 2014-11-09

## 2014-08-17 MED ORDER — HYDROCHLOROTHIAZIDE 25 MG PO TABS: 25.0000 mg | ORAL_TABLET | Freq: Every day | ORAL | Status: DC

## 2014-08-17 MED ORDER — PAROXETINE HCL 30 MG PO TABS: ORAL_TABLET | ORAL | Status: DC

## 2014-08-17 MED ORDER — METFORMIN HCL 500 MG PO TABS
ORAL_TABLET | ORAL | Status: DC
Start: 2014-08-17 — End: 2014-10-10

## 2014-08-17 NOTE — Progress Notes (Signed)
Subjective:    Patient ID: Christian Morris, male    DOB: 17-May-1940, 74 y.o.   MRN: [id number]  HPI  Christian Morris is a 74 yr old male who presents today for follow up.  DM2- maintained on lantus, metformin.  Reports good compliance with lantus. Does not check sugars, denies symptomatic hypoglycemia.  Lab Results  Component Value Date   HGBA1C 6.5* 02/22/2014   HGBA1C 6.9* 12/27/2013   HGBA1C 6.4* 07/17/2013   Lab Results  Component Value Date   MICROALBUR 6.59* 07/17/2013   LDLCALC 144* 02/22/2014   CREATININE 1.32 05/14/2014   Hyperlipidemia- on statin.  Last LDL above goal. tolerating statin.   HTN-  maintained on losartan and hctz.   BP Readings from Last 3 Encounters:  08/17/14 122/68  08/10/14 110/64  06/29/14 138/90   Anxiety- maintained on paxil, Reports that he uses valium HS- most nights.  Reports that anxiety is well controlled.  Denies depression.    Review of Systems See HPI  Past Medical History  Diagnosis Date  . Hypertension   . Hyperlipidemia   . History of gastric ulcer ~ 1958    no recurrence since.  . Hypogonadism male 01/11/2011  . Lung nodule     right 16 mm, seen initially 6/12. 39mm in 08/2011.  [name] GERD (gastroesophageal reflux disease)   . Fatty liver disease, nonalcoholic   . Kidney stones   . Squamous cell cancer of skin of forearm 01/23/2011    left  . Depression   . Type II diabetes mellitus   . History of blood transfusion ~ 1958    "related to bleeding ulcers"  . Daily headache     "here lately" (02/21/2014)  . Arthritis     "legs" (02/21/2014)  . Chronic lower back pain   . Anxiety   . OSA on CPAP   . Urinary hesitancy     Social History   Social History  . Marital Status: Divorced    Spouse Name: N/A  . Number of Children: 3  . Years of Education: N/A   Occupational History  . RETIRED BUS DRIVER    Social History Main Topics  . Smoking status: Former Smoker -- 1.00 packs/day for 25 years    Types: Cigarettes    Quit date:  01/05/1982  . Smokeless tobacco: Never Used  . Alcohol Use: Yes     Comment: "quit drinking in the 1980's"  . Drug Use: No  . Sexual Activity: Yes   Other Topics Concern  . 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    Past Surgical History  Procedure Laterality Date  . Melanoma excision Left 01/23/2011    forearm  . Left heart catheterization with coronary angiogram N/A 11/17/2012    Procedure: LEFT HEART CATHETERIZATION WITH CORONARY ANGIOGRAM;  Surgeon: Burnell Blanks, MD;  Location: [facility] CATH LAB;  Service: Cardiovascular;  Laterality: N/A;  . Cataract extraction w/ intraocular lens implant Left 08/2009    Dr Katy Fitch  . Cardiac catheterization      Family History  Problem Relation Age of Onset  . Arthritis Mother   . Heart disease Sister     Massive MI age 98.  . Arrhythmia Brother   . Melanoma Son   . Heart attack Brother     No Known Allergies  Current Outpatient Prescriptions on File Prior to Visit  Medication  Sig Dispense Refill  . aspirin EC 81 MG tablet Take 81 mg by mouth every morning.     [name] atorvastatin (LIPITOR) 40 MG tablet Take 1 tablet (40 mg total) by mouth daily. 30 tablet 5  . diazepam (VALIUM) 10 MG tablet Take 1 tablet (10 mg total) by mouth at bedtime as needed for anxiety. 30 tablet 0  . fish oil-omega-3 fatty acids 1000 MG capsule Take 2 g by mouth 2 (two) times daily.      [name] glucose blood test strip Check blood sugar daily and as needed. 100 each 12  . hydrochlorothiazide (HYDRODIURIL) 25 MG tablet Take 1 tablet (25 mg total) by mouth daily. 30 tablet 5  . HYDROcodone-acetaminophen (NORCO) 7.5-325 MG per tablet Take 1 tablet by mouth every 6 (six) hours as needed for moderate pain. 90 tablet 0  . insulin glargine (LANTUS) 100 UNIT/ML injection Inject 5 Units into the skin at bedtime.    [name] losartan (COZAAR) 25 MG tablet Take 1 tablet (25 mg total) by mouth daily.  90 tablet 1  . metFORMIN (GLUCOPHAGE) 500 MG tablet TAKE TWO TABLETS BY MOUTH TWICE DAILY WITH MEALS 120 tablet 5  . nitroGLYCERIN (NITROSTAT) 0.4 MG SL tablet Place 1 tablet (0.4 mg total) under the tongue every 5 (five) minutes x 3 doses as needed for chest pain (donot take with viagra). 60 tablet 12  . PARoxetine (PAXIL) 30 MG tablet TAKE ONE TABLET BY MOUTH EVERY MORNING 30 tablet 5  . tamsulosin (FLOMAX) 0.4 MG CAPS capsule Take 1 capsule (0.4 mg total) by mouth daily. 30 capsule 5   No current facility-administered medications on file prior to visit.    BP 122/68 mmHg  Pulse 87  Temp(Src) 97.7 F (36.5 C) (Oral)  Ht 6' (1.829 m)  Wt 278 lb 9.6 oz (126.372 kg)  BMI 37.78 kg/m2  SpO2 96%       Objective:   Physical Exam  Constitutional: He is oriented to person, place, and time. He appears well-developed and well-nourished. No distress.  HENT:  Head: Normocephalic and atraumatic.  Cardiovascular: Normal rate and regular rhythm.   No murmur heard. Pulmonary/Chest: Effort normal and breath sounds normal. No respiratory distress. He has no wheezes. He has no rales.  Musculoskeletal: He exhibits no edema.  Neurological: He is alert and oriented to person, place, and time.  Skin: Skin is warm and dry.  Psychiatric: He has a normal mood and affect. His behavior is normal. Thought content normal.          Assessment & Plan:

## 2014-08-17 NOTE — Assessment & Plan Note (Signed)
Clinically stable, obtain A1c, bmet urine microalbumin, continue metformin and lantus.

## 2014-08-17 NOTE — Assessment & Plan Note (Signed)
BP stable on current meds. Continue same.  

## 2014-08-17 NOTE — Patient Instructions (Signed)
Please complete lab work prior to leaving.   

## 2014-08-17 NOTE — Assessment & Plan Note (Signed)
Still does not have a stable housing situation. Reports that he has been staying with various family members but the person who he is staying with now wants him to move out.  He is looking into other options.

## 2014-08-17 NOTE — Assessment & Plan Note (Signed)
Stable on paxil and valium prn.  Obtain uds.

## 2014-08-17 NOTE — Progress Notes (Signed)
Pre visit review using our clinic review tool, if applicable. No additional management support is needed unless otherwise documented below in the visit note. 

## 2014-08-17 NOTE — Assessment & Plan Note (Signed)
Tolerating statin, obtain lipid panel.  

## 2014-08-19 ENCOUNTER — Encounter: Payer: Self-pay | Admitting: Family

## 2014-09-03 ENCOUNTER — Ambulatory Visit: Payer: PPO | Admitting: Family

## 2014-09-07 ENCOUNTER — Telehealth: Payer: Self-pay | Admitting: Family

## 2014-09-07 NOTE — Telephone Encounter (Signed)
Relation to LN:[id number] Call back number:[phone]   Reason for call:  Patient states HYDROcodone-acetaminophen (Fif[place]Six) 7.5-325 MG per tablet is not working back pain has not improved

## 2014-09-07 NOTE — Telephone Encounter (Signed)
Spoke with pt. He states he "continues to have pain in the center of his back where he always has". States hydrocodone is not helping any longer. Denies numbness or tingling of any extremities. Has not been applying heat or ice. Please advise.

## 2014-09-07 NOTE — Telephone Encounter (Signed)
Notified pt. He states he will let us know what he decides.

## 2014-09-07 NOTE — Telephone Encounter (Signed)
I would recommend either a pain clinic referral or referral for physical therapy.  I would not recommend that we increase his pain meds at this time.

## 2014-09-14 MED ORDER — HYDROCODONE-ACETAMINOPHEN 7.5-325 MG PO TABS
1.0000 | ORAL_TABLET | Freq: Four times a day (QID) | ORAL | Status: DC | PRN
Start: 2014-09-14 — End: 2014-10-08

## 2014-09-14 NOTE — Telephone Encounter (Signed)
Last hydrocodone Rx 08/17/14, #90. Last UDS 04/2014 and due for repeat. Please see below documentation. Rx printed and forwarded to PCP for signature.

## 2014-09-14 NOTE — Telephone Encounter (Signed)
Pt called in again for pain meds. Asked pt about below and he said he didn't call last week for pain meds. Read him the exchange below and he states that he is not going to pain clinic or physical therapy. He wants Melissa to write the prescription like she always does and he'll come pick it up.

## 2014-09-14 NOTE — Telephone Encounter (Signed)
Rx placed at front desk and pt has been made aware to provide UDS when he picks up Rx. Pt states it will be Monday before he can pick it up.

## 2014-09-14 NOTE — Addendum Note (Signed)
Addended by: Kelle Darting A on: 09/14/2014 12:15 PM   Modules accepted: Orders

## 2014-09-17 ENCOUNTER — Other Ambulatory Visit: Payer: Self-pay | Admitting: Family Medicine

## 2014-09-17 NOTE — Telephone Encounter (Signed)
Rx called to pharmacy voicemail. 

## 2014-09-17 NOTE — Telephone Encounter (Signed)
Patient requesting Valium refill Last refill- 08/17/14 for 30 with 0 LOV 08/17/14 UDS: 04/09/14 moderate risk, contract same day

## 2014-09-17 NOTE — Telephone Encounter (Signed)
Ok to call in 30 tabs zero refills.  

## 2014-09-27 ENCOUNTER — Ambulatory Visit: Payer: PPO

## 2014-09-27 DIAGNOSIS — Z23 Encounter for immunization: Secondary | ICD-10-CM

## 2014-09-27 MED ORDER — INFLUENZA VAC SPLIT QUAD 0.5 ML IM SUSY
0.5000 mL | PREFILLED_SYRINGE | Freq: Once | INTRAMUSCULAR | Status: AC
  Administered 2014-09-27: 0.5 mL via INTRAMUSCULAR

## 2014-10-08 ENCOUNTER — Encounter: Payer: Self-pay | Admitting: Family

## 2014-10-08 ENCOUNTER — Ambulatory Visit: Payer: PPO | Admitting: Family

## 2014-10-08 ENCOUNTER — Ambulatory Visit (INDEPENDENT_AMBULATORY_CARE_PROVIDER_SITE_OTHER): Payer: PPO | Admitting: Family

## 2014-10-08 VITALS — BP 108/53 | HR 108 | Temp 98.1°F | Resp 18 | Ht 72.0 in | Wt 257.0 lb

## 2014-10-08 DIAGNOSIS — N400 Enlarged prostate without lower urinary tract symptoms: Secondary | ICD-10-CM | POA: Diagnosis not present

## 2014-10-08 DIAGNOSIS — R634 Abnormal weight loss: Secondary | ICD-10-CM

## 2014-10-08 DIAGNOSIS — R3129 Other microscopic hematuria: Secondary | ICD-10-CM | POA: Diagnosis not present

## 2014-10-08 DIAGNOSIS — R3911 Hesitancy of micturition: Secondary | ICD-10-CM | POA: Diagnosis not present

## 2014-10-08 LAB — COMPREHENSIVE METABOLIC PANEL
ALBUMIN: 4.5 g/dL (ref 3.5–5.2)
ALK PHOS: 61 U/L (ref 39–117)
ALT: 14 U/L (ref 0–53)
AST: 15 U/L (ref 0–37)
BILIRUBIN TOTAL: 0.8 mg/dL (ref 0.2–1.2)
BUN: 34 mg/dL — ABNORMAL HIGH (ref 6–23)
CALCIUM: 10 mg/dL (ref 8.4–10.5)
CO2: 26 mEq/L (ref 19–32)
Chloride: 100 mEq/L (ref 96–112)
Creatinine, Ser: 1.64 mg/dL — ABNORMAL HIGH (ref 0.40–1.50)
GFR: 43.62 mL/min — AB (ref >60.00)
GLUCOSE: 205 mg/dL — AB (ref 70–99)
POTASSIUM: 3.8 meq/L (ref 3.5–5.1)
Sodium: 138 mEq/L (ref 135–145)
TOTAL PROTEIN: 7.8 g/dL (ref 6.0–8.3)

## 2014-10-08 LAB — CBC WITH DIFFERENTIAL/PLATELET
BASOS ABS: 0.1 10*3/uL (ref 0.0–0.1)
Basophils Relative: 0.6 % (ref 0.0–3.0)
Eosinophils Absolute: 0.1 10*3/uL (ref 0.0–0.7)
Eosinophils Relative: 1 % (ref 0.0–5.0)
HEMATOCRIT: 43 % (ref 39.0–52.0)
HEMOGLOBIN: 14.1 g/dL (ref 13.0–17.0)
LYMPHS PCT: 20.1 % (ref 12.0–46.0)
Lymphs Abs: 2.1 10*3/uL (ref 0.7–4.0)
MCHC: 32.8 g/dL (ref 30.0–36.0)
MCV: 86.5 fl (ref 78.0–100.0)
Monocytes Absolute: 0.5 10*3/uL (ref 0.1–1.0)
Monocytes Relative: 4.5 % (ref 3.0–12.0)
NEUTROS PCT: 73.8 % (ref 43.0–77.0)
Neutro Abs: 7.6 10*3/uL (ref 1.4–7.7)
Platelets: 282 10*3/uL (ref 150.0–400.0)
RBC: 4.97 Mil/uL (ref 4.22–5.81)
RDW: 13 % (ref 11.5–15.5)
WBC: 10.4 10*3/uL (ref 4.0–10.5)

## 2014-10-08 LAB — POCT URINALYSIS DIPSTICK
Bilirubin, UA: NEGATIVE
GLUCOSE UA: NEGATIVE
Ketones, UA: NEGATIVE
Leukocytes, UA: NEGATIVE
Nitrite, UA: NEGATIVE
PH UA: 5
Protein, UA: NEGATIVE
SPEC GRAV UA: 1.025
UROBILINOGEN UA: NEGATIVE

## 2014-10-08 LAB — TSH: TSH: 0.69 u[IU]/mL (ref 0.35–4.50)

## 2014-10-08 MED ORDER — HYDROCODONE-ACETAMINOPHEN 7.5-325 MG PO TABS: 1.0000 | ORAL_TABLET | Freq: Four times a day (QID) | ORAL | Status: DC | PRN

## 2014-10-08 MED ORDER — HYDROCODONE-ACETAMINOPHEN 7.5-325 MG PO TABS: ORAL_TABLET | ORAL | Status: DC

## 2014-10-08 MED ORDER — DIAZEPAM 10 MG PO TABS: ORAL_TABLET | ORAL | Status: DC

## 2014-10-08 NOTE — Patient Instructions (Addendum)
Please complete lab work prior to leaving. You will be contacted about your referral for CT scan at [facility]. Hold metformin for 2 days after you complete your CT scan, then restart. Try to eat 3 balanced meals a day. Follow up in 1 month.

## 2014-10-08 NOTE — Progress Notes (Signed)
Subjective:    Patient ID: Christian Morris, male    DOB: 07/22/1940, 74 y.o.   MRN: [id number]  HPI  [name] is a 74 yr old male who presents today with chief complaint of urinary hesitencey. He reports that his symptoms began 1 week ago.  Pt reports that he continues to have urgency but when he gets to the bathroom he is unable to go.  He denies dysuria but he notes that he does have some suprapubic discomfort when he is not able to urinate. He denies fever.  He denies hematuria. Nocturia x 2. He reports urinary stream is weak.  He reports that he has been compliant with flomax.    Weight Loss-  Wt Readings from Last 3 Encounters:  10/08/14 257 lb (116.574 kg)  08/17/14 278 lb 9.6 oz (126.372 kg)  08/10/14 275 lb 9.6 oz (125.011 kg)   He is not exercising.  He reports that he is not eating as much.  Having some financial constraints. He recently moved into a new apartment in [place]. Reports that he has "been through a lot." for some time he did not have a permanent residence and was staying with family. He reports that he tries to eat 3 times a day but sometimes just doesn't.  He denies nausea.      Review of Systems  Constitutional: Positive for unexpected weight change. Negative for fever.  HENT: Positive for rhinorrhea.   Respiratory:       Mild cough  Gastrointestinal:       Had one loose stool yesterday.    Past Medical History  Diagnosis Date  . Hypertension   . Hyperlipidemia   . History of gastric ulcer ~ 1958    no recurrence since.  . Hypogonadism male 01/11/2011  . Lung nodule     right 16 mm, seen initially 6/12. 82mm in 08/2011.  [name] GERD (gastroesophageal reflux disease)   . Fatty liver disease, nonalcoholic   . Kidney stones   . Squamous cell cancer of skin of forearm 01/23/2011    left  . Depression   . Type II diabetes mellitus ([place])   . History of blood transfusion ~ 1958    "related to bleeding ulcers"  . Daily headache     "here lately" (02/21/2014)    . Arthritis     "legs" (02/21/2014)  . Chronic lower back pain   . Anxiety   . OSA on CPAP   . Urinary hesitancy     Social History   Social History  . Marital Status: Divorced    Spouse Name: N/A  . Number of Children: 3  . Years of Education: N/A   Occupational History  . RETIRED BUS DRIVER    Social History Main Topics  . Smoking status: Former Smoker -- 1.00 packs/day for 25 years    Types: Cigarettes    Quit date: 01/05/1982  . Smokeless tobacco: Never Used  . Alcohol Use: Yes     Comment: "quit drinking in the 1980's"  . Drug Use: No  . Sexual Activity: Yes   Other Topics Concern  . 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    Past Surgical History  Procedure Laterality Date  . Melanoma excision Left 01/23/2011    forearm  . Left heart catheterization with coronary angiogram N/A 11/17/2012    Procedure: LEFT HEART  CATHETERIZATION WITH CORONARY ANGIOGRAM;  Surgeon: Burnell Blanks, MD;  Location: [facility] CATH LAB;  Service: Cardiovascular;  Laterality: N/A;  . Cataract extraction w/ intraocular lens implant Left 08/2009    Dr Katy Fitch  . Cardiac catheterization      Family History  Problem Relation Age of Onset  . Arthritis Mother   . Heart disease Sister     Massive MI age 40.  . Arrhythmia Brother   . Melanoma Son   . Heart attack Brother     No Known Allergies  Current Outpatient Prescriptions on File Prior to Visit  Medication Sig Dispense Refill  . aspirin EC 81 MG tablet Take 81 mg by mouth every morning.     [name] atorvastatin (LIPITOR) 40 MG tablet Take 1 tablet (40 mg total) by mouth daily. 30 tablet 5  . diazepam (VALIUM) 10 MG tablet take 1 tablet by mouth at bedtime if needed for anxiety 30 tablet 0  . fish oil-omega-3 fatty acids 1000 MG capsule Take 2 g by mouth 2 (two) times daily.      [name] glucose blood test strip Check blood sugar daily and as needed. 100  each 12  . hydrochlorothiazide (HYDRODIURIL) 25 MG tablet Take 1 tablet (25 mg total) by mouth daily. 30 tablet 5  . HYDROcodone-acetaminophen (NORCO) 7.5-325 MG per tablet Take 1 tablet by mouth every 6 (six) hours as needed for moderate pain. 90 tablet 0  . insulin glargine (LANTUS) 100 UNIT/ML injection Inject 0.05 mLs (5 Units total) into the skin at bedtime. 10 mL 5  . losartan (COZAAR) 25 MG tablet Take 1 tablet (25 mg total) by mouth daily. 90 tablet 1  . metFORMIN (GLUCOPHAGE) 500 MG tablet TAKE TWO TABLETS BY MOUTH TWICE DAILY WITH MEALS 120 tablet 5  . nitroGLYCERIN (NITROSTAT) 0.4 MG SL tablet Place 1 tablet (0.4 mg total) under the tongue every 5 (five) minutes x 3 doses as needed for chest pain (donot take with viagra). 60 tablet 12  . PARoxetine (PAXIL) 30 MG tablet TAKE ONE TABLET BY MOUTH EVERY MORNING 30 tablet 5  . tamsulosin (FLOMAX) 0.4 MG CAPS capsule Take 1 capsule (0.4 mg total) by mouth daily. 30 capsule 5   No current facility-administered medications on file prior to visit.    BP 108/53 mmHg  Pulse 108  Temp(Src) 98.1 F (36.7 C) (Oral)  Resp 18  Ht 6' (1.829 m)  Wt 257 lb (116.574 kg)  BMI 34.85 kg/m2  SpO2 98%       Objective:   Physical Exam  Constitutional: He is oriented to person, place, and time. He appears well-developed and well-nourished. No distress.  HENT:  Head: Normocephalic and atraumatic.  Cardiovascular: Normal rate and regular rhythm.   No murmur heard. Pulmonary/Chest: Effort normal and breath sounds normal. No respiratory distress. He has no wheezes. He has no rales.  Musculoskeletal: He exhibits no edema.  Neurological: He is alert and oriented to person, place, and time.  Skin: Skin is warm and dry.  Psychiatric: He has a normal mood and affect. His behavior is normal. Thought content normal.          Assessment & Plan:  Weight loss- could be stress related but it has been very sudden. Will obtain TSH, CMET, CBC, also CT  chest/abd/pelvis to evaluate for underlying malignancy.

## 2014-10-08 NOTE — Addendum Note (Signed)
Addended by: Harl Bowie on: 10/08/2014 11:48 AM   Modules accepted: Orders

## 2014-10-08 NOTE — Assessment & Plan Note (Signed)
Will refer back to Urology. Continue flomax. UA notes + blood, obtain urine culture, though clinically doubt infection. He has hx of nephrolithiasis which is likely cause of his microscopic hematuria.

## 2014-10-08 NOTE — Progress Notes (Signed)
Pre visit review using our clinic review tool, if applicable. No additional management support is needed unless otherwise documented below in the visit note. 

## 2014-10-09 ENCOUNTER — Telehealth: Payer: Self-pay | Admitting: *Deleted

## 2014-10-09 LAB — URINE CULTURE
Colony Count: NO GROWTH
ORGANISM ID, BACTERIA: NO GROWTH

## 2014-10-09 NOTE — Telephone Encounter (Signed)
Received call from [facility] at [facility] stating pt brought in Hydrocodone Rx from 10/08/14 and no directions printed on Rx. States she has to speak with prescriber, cannot take directions from me. PCP provided directions of 1 tablet three times daily as needed. Med list updated.

## 2014-10-10 ENCOUNTER — Other Ambulatory Visit: Payer: Self-pay | Admitting: Family

## 2014-10-10 ENCOUNTER — Telehealth: Payer: Self-pay | Admitting: Family

## 2014-10-10 NOTE — Telephone Encounter (Signed)
Kidney function has worsened mildly.  Make sure that he avoids NSAIDS. Stop metformin, increase lantus from 5 untis to 10 units once daily.

## 2014-10-11 NOTE — Telephone Encounter (Signed)
Spoke with pt and he voices understanding.  

## 2014-10-12 ENCOUNTER — Other Ambulatory Visit: Payer: PPO

## 2014-10-12 ENCOUNTER — Ambulatory Visit (HOSPITAL_BASED_OUTPATIENT_CLINIC_OR_DEPARTMENT_OTHER): Payer: PPO

## 2014-10-12 ENCOUNTER — Other Ambulatory Visit (HOSPITAL_COMMUNITY): Payer: PPO

## 2014-10-18 ENCOUNTER — Telehealth: Payer: Self-pay | Admitting: Family

## 2014-10-18 NOTE — Telephone Encounter (Signed)
Last OV: 10/08/14 Reason for visit:  Urinary hesitancy- Pt states he is having problems urinating again x 1 week.  BPH (benign prostatic hyperplasia) - [name], NP at 10/08/2014 11:27 AM     Status: Written Related Problem: BPH (benign prostatic hyperplasia)   Expand All Collapse All   Will refer back to Urology. Continue flomax. UA notes + blood, obtain urine culture, though clinically doubt infection. He has hx of nephrolithiasis which is likely cause of his microscopic hematuria.          Pt states he was unaware of the referral and wanted to know why it was ordered.  Pt was informed of the above note.  He stated understanding and says he would call urology office back regarding appointment.  No further needs voiced at this time.

## 2014-10-18 NOTE — Telephone Encounter (Signed)
Pt called in regarding appt with [facility] on 10/22/14 (see referral). He said that he didn't know anything about it and is asking why he needs to go there. Requesting call from [facility] to tell him if he needs to go.

## 2014-10-19 NOTE — Telephone Encounter (Signed)
Pt asked that we call and cancel appt with Neurology that [facility] scheduled. He states he does not want to go and is not going to sit on the phone with [place].

## 2014-10-19 NOTE — Telephone Encounter (Signed)
Melissa, please advise

## 2014-10-19 NOTE — Telephone Encounter (Signed)
Pt called back saying he was contacted by urology. I explained reason for referral as below. Pt requested urology phone # and it was given to pt.

## 2014-10-20 NOTE — Telephone Encounter (Signed)
He has blood in his urine and he has difficulty urinating.  I really think it is important for him to meet with urology.

## 2014-10-22 NOTE — Telephone Encounter (Signed)
Spoke with pt. He states he already cancelled urology appt. Placed pt on hold and called [facility]. They have had a cancellation for tomorrow and can see pt at 12pm with Dr Louis Meckel.  Pt will take new appt and information given to pt as well as office phone # and address.

## 2014-10-27 IMAGING — CR DG CHEST 2V
2 series · 2 of 2 positions shown · non-contrast
Comparison: 12/10/2012

CLINICAL DATA: Basilar lung crackles. Recent hospital admission for
pneumonia.

EXAM:
CHEST  2 VIEW

[w chest pa]
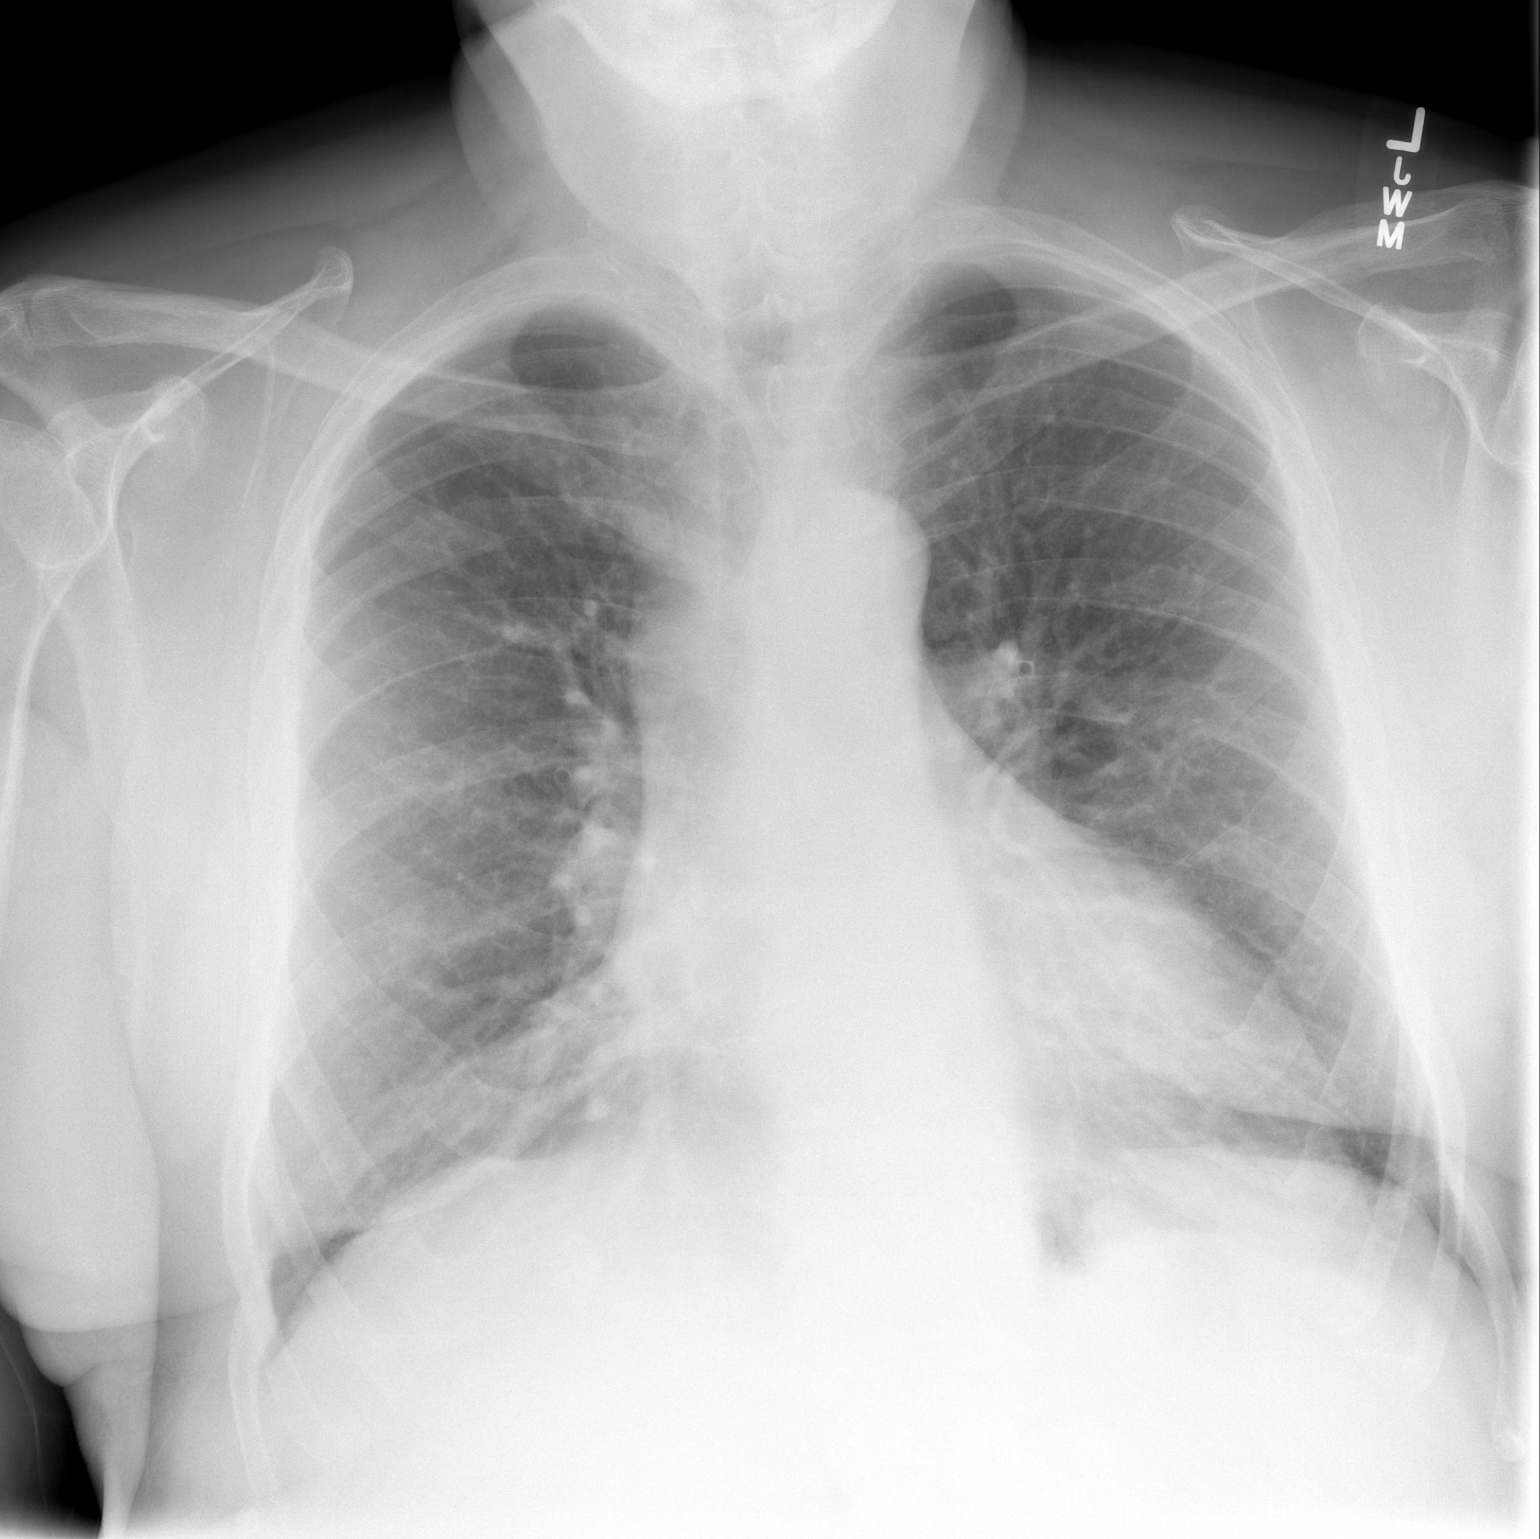

[w chest lat]
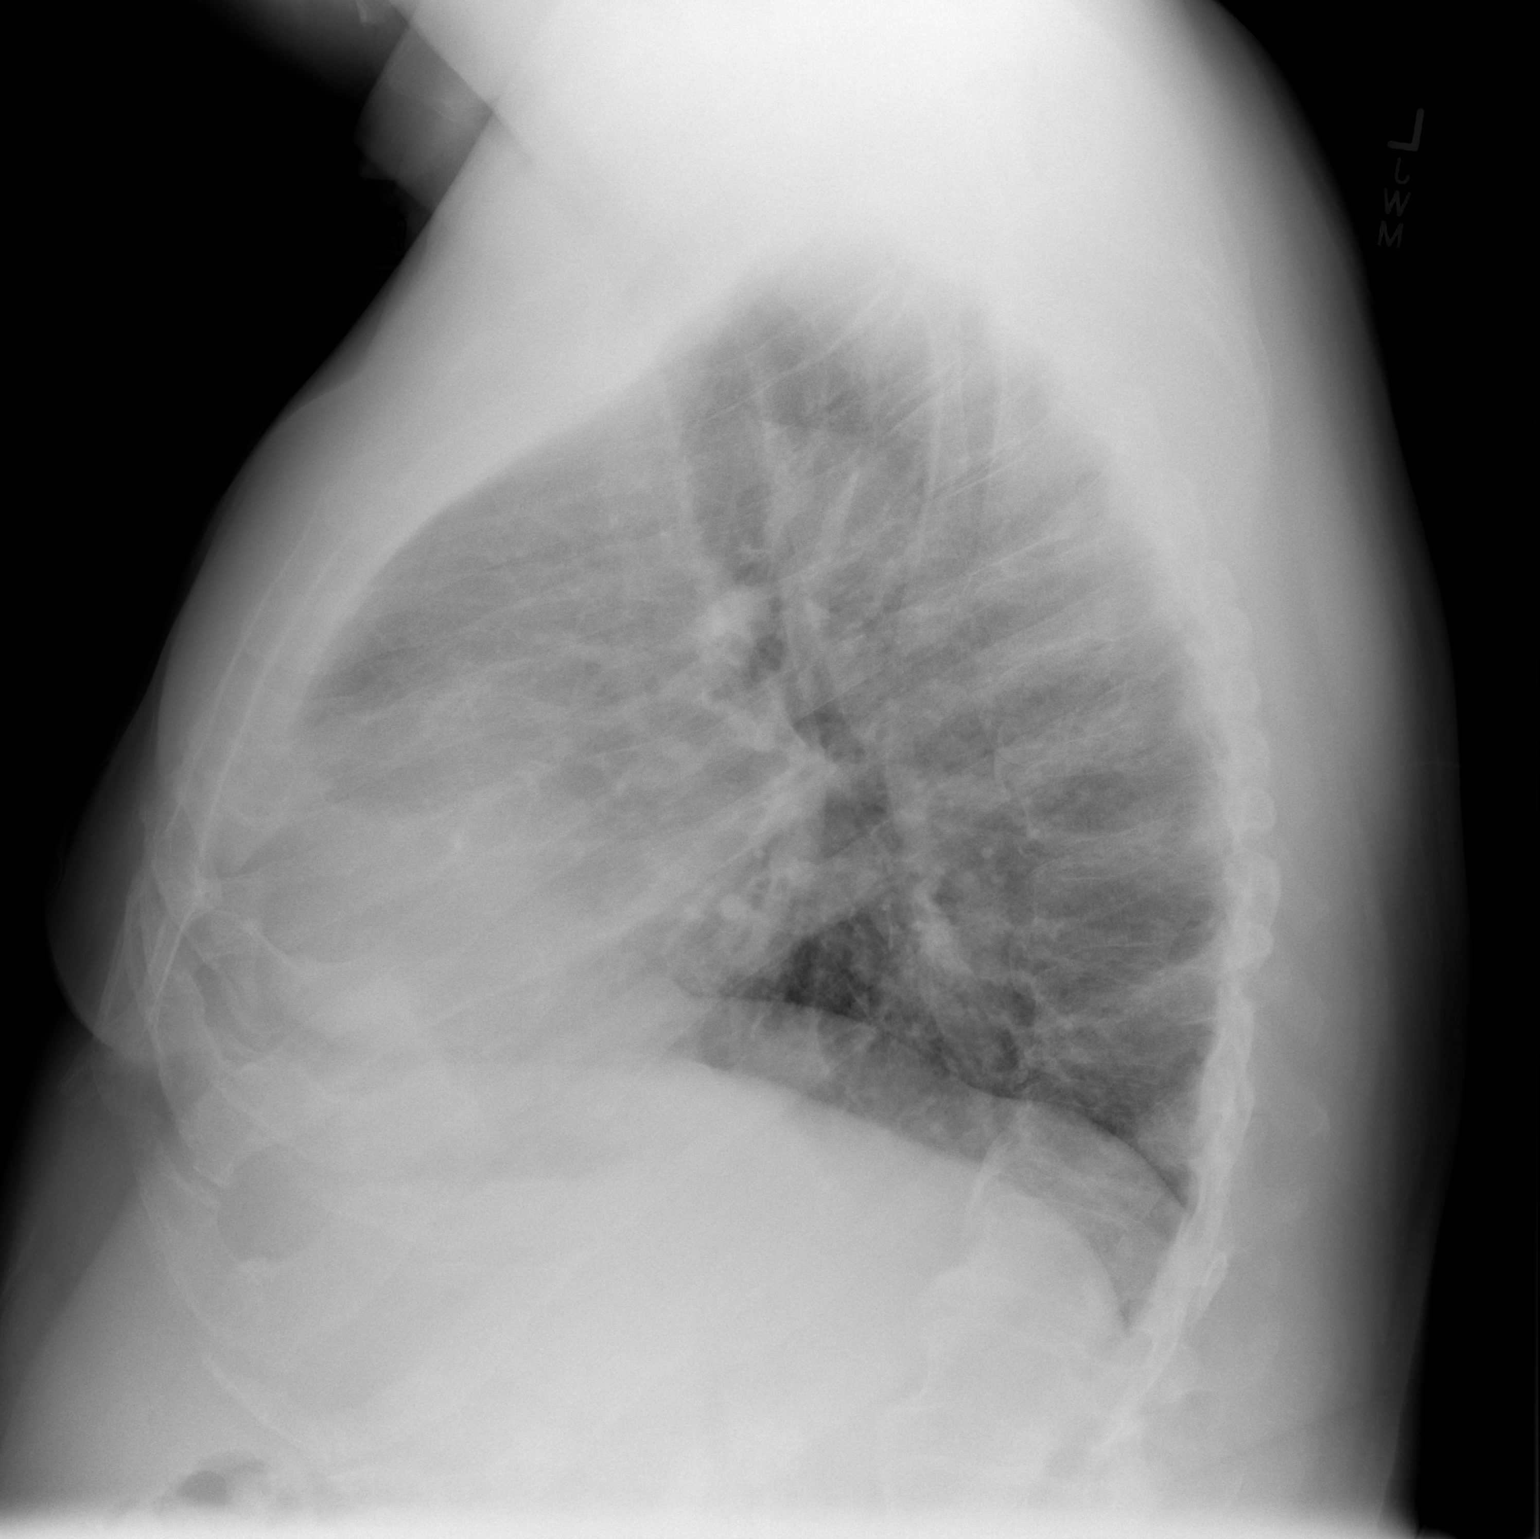

[2 of 2 positions shown; findings below may reference images not displayed]

FINDINGS: Cardiac silhouette is normal in size and configuration mediastinum
is normal in contour. No hilar masses.

The lungs are clear.  No pleural effusion or pneumothorax.

The bony thorax is demineralized but intact.
IMPRESSION: No active cardiopulmonary disease.

## 2014-11-05 ENCOUNTER — Other Ambulatory Visit: Payer: Self-pay | Admitting: Family

## 2014-11-05 NOTE — Telephone Encounter (Signed)
Caller name: [name]   Relationship to patient: Self  Can be reached: [phone]  Pharmacy:  Reason for call: pt is requesting a refill on his HYDROcodone Rx. He will be in tomorrow for pick up

## 2014-11-05 NOTE — Telephone Encounter (Signed)
Last Rx printed on 10/08/14 with note not to fill before 10/13/14. Next UDS due 12/17/14. Please advise if ok to print Rx as pended below?

## 2014-11-06 ENCOUNTER — Telehealth: Payer: Self-pay

## 2014-11-06 MED ORDER — HYDROCODONE-ACETAMINOPHEN 7.5-325 MG PO TABS: 1.0000 | ORAL_TABLET | Freq: Three times a day (TID) | ORAL | Status: DC | PRN

## 2014-11-06 MED ORDER — INSULIN PEN NEEDLE 32G X 4 MM MISC: Status: DC

## 2014-11-06 NOTE — Telephone Encounter (Signed)
Requesting rx to be filled 11/3, reviewed refill hx, do not fil prior to 11/7.  Reprinted rx.

## 2014-11-06 NOTE — Telephone Encounter (Signed)
Christian Morris needs rx pin needle  for insulin patient is out pharmacy does not have a prescription for it. Patient is waiting in the pharmacy please fax script [facility] [phone]

## 2014-11-06 NOTE — Telephone Encounter (Signed)
See Rx 

## 2014-11-06 NOTE — Telephone Encounter (Signed)
Rx sent to pharmacy, verified with pharmacy that it was received.

## 2014-11-06 NOTE — Telephone Encounter (Signed)
Rx placed at front desk for pick up. Attempted to notify pt and reached voicemail.  Did not leave message; will try pt again.

## 2014-11-06 NOTE — Telephone Encounter (Signed)
Pt called in following up on his Rx refill request. Advised pt that we will call him when ready for pick up.

## 2014-11-09 ENCOUNTER — Ambulatory Visit (HOSPITAL_BASED_OUTPATIENT_CLINIC_OR_DEPARTMENT_OTHER)
Admission: RE | Admit: 2014-11-09 | Discharge: 2014-11-09 | Disposition: A | Discharge status: Home / Self Care | Payer: PPO | Source: Ambulatory Visit | Attending: Family | Admitting: Family

## 2014-11-09 ENCOUNTER — Ambulatory Visit: Payer: PPO | Admitting: Family

## 2014-11-09 ENCOUNTER — Ambulatory Visit (INDEPENDENT_AMBULATORY_CARE_PROVIDER_SITE_OTHER): Payer: PPO | Admitting: Family

## 2014-11-09 ENCOUNTER — Encounter: Payer: Self-pay | Admitting: Family

## 2014-11-09 VITALS — BP 117/56 | HR 85 | Temp 98.4°F | Resp 16 | Ht 72.0 in | Wt 251.2 lb

## 2014-11-09 DIAGNOSIS — N289 Disorder of kidney and ureter, unspecified: Secondary | ICD-10-CM | POA: Diagnosis not present

## 2014-11-09 DIAGNOSIS — N200 Calculus of kidney: Secondary | ICD-10-CM | POA: Diagnosis not present

## 2014-11-09 DIAGNOSIS — R634 Abnormal weight loss: Secondary | ICD-10-CM | POA: Diagnosis not present

## 2014-11-09 DIAGNOSIS — I700 Atherosclerosis of aorta: Secondary | ICD-10-CM | POA: Insufficient documentation

## 2014-11-09 DIAGNOSIS — D71 Functional disorders of polymorphonuclear neutrophils: Secondary | ICD-10-CM | POA: Insufficient documentation

## 2014-11-09 DIAGNOSIS — M5137 Other intervertebral disc degeneration, lumbosacral region: Secondary | ICD-10-CM | POA: Insufficient documentation

## 2014-11-09 DIAGNOSIS — R197 Diarrhea, unspecified: Secondary | ICD-10-CM | POA: Diagnosis not present

## 2014-11-09 DIAGNOSIS — R319 Hematuria, unspecified: Secondary | ICD-10-CM | POA: Diagnosis not present

## 2014-11-09 DIAGNOSIS — I10 Essential (primary) hypertension: Secondary | ICD-10-CM | POA: Diagnosis not present

## 2014-11-09 DIAGNOSIS — I313 Pericardial effusion (noninflammatory): Secondary | ICD-10-CM | POA: Diagnosis not present

## 2014-11-09 DIAGNOSIS — I2510 Atherosclerotic heart disease of native coronary artery without angina pectoris: Secondary | ICD-10-CM | POA: Diagnosis not present

## 2014-11-09 DIAGNOSIS — F341 Dysthymic disorder: Secondary | ICD-10-CM

## 2014-11-09 DIAGNOSIS — E11649 Type 2 diabetes mellitus with hypoglycemia without coma: Secondary | ICD-10-CM

## 2014-11-09 LAB — BASIC METABOLIC PANEL
BUN: 41 mg/dL — ABNORMAL HIGH (ref 6–23)
CHLORIDE: 99 meq/L (ref 96–112)
CO2: 26 mEq/L (ref 19–32)
CREATININE: 1.29 mg/dL (ref 0.40–1.50)
Calcium: 11.5 mg/dL — ABNORMAL HIGH (ref 8.4–10.5)
GFR: 57.53 mL/min — ABNORMAL LOW (ref >60.00)
Glucose, Bld: 177 mg/dL — ABNORMAL HIGH (ref 70–99)
Potassium: 4.5 mEq/L (ref 3.5–5.1)
Sodium: 139 mEq/L (ref 135–145)

## 2014-11-09 MED ORDER — INSULIN GLARGINE 100 UNIT/ML ~~LOC~~ SOLN: 5.0000 [IU] | Freq: Every day | SUBCUTANEOUS | Status: DC

## 2014-11-09 MED ORDER — GLUCERNA SHAKE PO LIQD: 237.0000 mL | Freq: Two times a day (BID) | ORAL | Status: DC

## 2014-11-09 NOTE — Progress Notes (Signed)
Pre visit review using our clinic review tool, if applicable. No additional management support is needed unless otherwise documented below in the visit note. 

## 2014-11-09 NOTE — Progress Notes (Signed)
Subjective:    Patient ID: Christian Morris, male    DOB: 11-26-1940, 74 y.o.   MRN: [id number]  HPI  [name] is a 74 yr old male who presents today for follow up of his weight loss.  Pt continues to lose weight despite reported changes in diet or activity.   Sample diet per patient-  Breakfast-  Eggs, cereal Lunch-hot dog, sandwich/baked chicken, baked ham Dinner- same as lunch, occasional green beans but not that often   Wt Readings from Last 3 Encounters:  11/09/14 251 lb 3.2 oz (113.944 kg)  10/08/14 257 lb (116.574 kg)  08/17/14 278 lb 9.6 oz (126.372 kg)   HTN- maintained on hctz and losartan BP Readings from Last 3 Encounters:  11/09/14 117/56  10/08/14 108/53  08/17/14 122/68   BPH/hematuria- saw urology. Maintained on flomax.   Anxiety/depression- reports that his mood has mood has gotten better the last week.  He continues paxil.  Continue valium.    DM2-  Not checking sugars regularly.  Notes that he has symptoms of hypoglycemia and has to eat something.   Lab Results  Component Value Date   HGBA1C 6.5 08/17/2014   HGBA1C 6.5* 02/22/2014   HGBA1C 6.9* 12/27/2013   Lab Results  Component Value Date   MICROALBUR 11.4* 08/17/2014   LDLCALC 66 08/17/2014   CREATININE 1.64* 10/08/2014    Review of Systems Reports that bowel movements can be watery.   Reports that he had abdominal pain Wednesday AM and thought that he may have passed a kidney stone.      Past Medical History  Diagnosis Date  . Hypertension   . Hyperlipidemia   . History of gastric ulcer ~ 1958    no recurrence since.  . Hypogonadism male 01/11/2011  . Lung nodule     right 16 mm, seen initially 6/12. 60mm in 08/2011.  [name] GERD (gastroesophageal reflux disease)   . Fatty liver disease, nonalcoholic   . Kidney stones   . Squamous cell cancer of skin of forearm 01/23/2011    left  . Depression   . Type II diabetes mellitus ([place])   . History of blood transfusion ~ 1958    "related to  bleeding ulcers"  . Daily headache     "here lately" (02/21/2014)  . Arthritis     "legs" (02/21/2014)  . Chronic lower back pain   . Anxiety   . OSA on CPAP   . Urinary hesitancy     Social History   Social History  . Marital Status: Divorced    Spouse Name: N/A  . Number of Children: 3  . Years of Education: N/A   Occupational History  . RETIRED BUS DRIVER    Social History Main Topics  . Smoking status: Former Smoker -- 1.00 packs/day for 25 years    Types: Cigarettes    Quit date: 01/05/1982  . Smokeless tobacco: Never Used  . Alcohol Use: Yes     Comment: "quit drinking in the 1980's"  . Drug Use: No  . Sexual Activity: Yes   Other Topics Concern  . 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    Past Surgical History  Procedure Laterality Date  . Melanoma excision Left 01/23/2011    forearm  . Left heart catheterization with coronary angiogram N/A 11/17/2012    Procedure: LEFT HEART CATHETERIZATION WITH CORONARY  ANGIOGRAM;  Surgeon: Burnell Blanks, MD;  Location: [facility] CATH LAB;  Service: Cardiovascular;  Laterality: N/A;  . Cataract extraction w/ intraocular lens implant Left 08/2009    Dr Katy Fitch  . Cardiac catheterization      Family History  Problem Relation Age of Onset  . Arthritis Mother   . Heart disease Sister     Massive MI age 42.  . Arrhythmia Brother   . Melanoma Son   . Heart attack Brother     No Known Allergies  Current Outpatient Prescriptions on File Prior to Visit  Medication Sig Dispense Refill  . aspirin EC 81 MG tablet Take 81 mg by mouth every morning.     [name] atorvastatin (LIPITOR) 40 MG tablet Take 1 tablet (40 mg total) by mouth daily. 30 tablet 5  . diazepam (VALIUM) 10 MG tablet take 1 tablet by mouth at bedtime if needed for anxiety 30 tablet 0  . fish oil-omega-3 fatty acids 1000 MG capsule Take 2 g by mouth 2 (two) times daily.      [name]  glucose blood test strip Check blood sugar daily and as needed. 100 each 12  . hydrochlorothiazide (HYDRODIURIL) 25 MG tablet Take 1 tablet (25 mg total) by mouth daily. 30 tablet 5  . HYDROcodone-acetaminophen (NORCO) 7.5-325 MG tablet Take 1 tablet by mouth 3 (three) times daily as needed. DO NOT FILL PRIOR TO  11/12/14 90 tablet 0  . Insulin Pen Needle 32G X 4 MM MISC Use with insulin injection daily.  DX  E11.9 100 each 2  . losartan (COZAAR) 25 MG tablet Take 1 tablet (25 mg total) by mouth daily. 90 tablet 1  . nitroGLYCERIN (NITROSTAT) 0.4 MG SL tablet Place 1 tablet (0.4 mg total) under the tongue every 5 (five) minutes x 3 doses as needed for chest pain (donot take with viagra). 60 tablet 12  . PARoxetine (PAXIL) 30 MG tablet TAKE ONE TABLET BY MOUTH EVERY MORNING 30 tablet 5  . tamsulosin (FLOMAX) 0.4 MG CAPS capsule Take 1 capsule (0.4 mg total) by mouth daily. 30 capsule 5   No current facility-administered medications on file prior to visit.    BP 117/56 mmHg  Pulse 85  Temp(Src) 98.4 F (36.9 C) (Oral)  Resp 16  Ht 6' (1.829 m)  Wt 251 lb 3.2 oz (113.944 kg)  BMI 34.06 kg/m2  SpO2 98%    Objective:   Physical Exam  Constitutional: He is oriented to person, place, and time. He appears well-developed and well-nourished. No distress.  HENT:  Head: Normocephalic and atraumatic.  Cardiovascular: Normal rate and regular rhythm.   No murmur heard. Pulmonary/Chest: Effort normal and breath sounds normal. No respiratory distress. He has no wheezes. He has no rales.  Musculoskeletal: He exhibits no edema.  Neurological: He is alert and oriented to person, place, and time.  Skin: Skin is warm and dry.  Psychiatric: He has a normal mood and affect. His behavior is normal. Thought content normal.          Assessment & Plan:

## 2014-11-09 NOTE — Patient Instructions (Addendum)
Decrease lantus from 10 units to 5 units once each night.  Check sugar at least 1-2 times a day Complete lab work prior to leaving. Complete stool samples and return to lab at your earliest convenience.  Complete CT scan on the first floor.  Add glucerna twice daily between meals.

## 2014-11-11 ENCOUNTER — Telehealth: Payer: Self-pay | Admitting: Family

## 2014-11-11 DIAGNOSIS — R634 Abnormal weight loss: Secondary | ICD-10-CM | POA: Insufficient documentation

## 2014-11-11 NOTE — Telephone Encounter (Signed)
See result note for CT's.  Also, his calcium is elevated.  I would recommend that he return for labs as pended below please:

## 2014-11-11 NOTE — Assessment & Plan Note (Addendum)
74 yr old male with unintentional weight loss. He has lost 27 pounds in the last 3 months.  I don't thin he is eating enough calories.  Also,  I am concerned about possible underlying malignancy.  CT Chest/abd/pelvis- no obvious malignancy.  BMET reveals hypercalcemia- still concerning for malignancy (CA++ 11.5).  Will initiate Hypercalcemia work up .  See phone note.

## 2014-11-11 NOTE — Assessment & Plan Note (Addendum)
Stable on paxil, continue same. 

## 2014-11-11 NOTE — Assessment & Plan Note (Signed)
Will decrease lantus from 10 units to 5 units.  Sugars are running lower since he lost weight.

## 2014-11-11 NOTE — Assessment & Plan Note (Signed)
BP is stable on current meds.   

## 2014-11-12 NOTE — Telephone Encounter (Signed)
Notified pt and he voices understanding. Lab appt scheduled for 11/14/14 at 1:30pm.

## 2014-11-14 ENCOUNTER — Other Ambulatory Visit (INDEPENDENT_AMBULATORY_CARE_PROVIDER_SITE_OTHER): Payer: PPO

## 2014-11-14 LAB — VITAMIN D 25 HYDROXY (VIT D DEFICIENCY, FRACTURES): VITD: 15.1 ng/mL — ABNORMAL LOW (ref 30.00–100.00)

## 2014-11-15 ENCOUNTER — Telehealth: Payer: Self-pay | Admitting: Family

## 2014-11-15 LAB — PARATHYROID HORMONE, INTACT (NO CA): PTH: 31 pg/mL (ref 14–64)

## 2014-11-15 LAB — CALCIUM, IONIZED: Calcium, Ion: 1.3 mmol/L (ref 1.12–1.32)

## 2014-11-15 NOTE — Telephone Encounter (Signed)
Caller name: Self   Can be reached:[phone]   Reason for call: Calling for results from yesterday's labs

## 2014-11-16 LAB — PROTEIN ELECTROPHORESIS, SERUM
ALPHA-2-GLOBULIN: 0.9 g/dL (ref 0.5–0.9)
Albumin ELP: 4.4 g/dL (ref 3.8–4.8)
Alpha-1-Globulin: 0.3 g/dL (ref 0.2–0.3)
BETA GLOBULIN: 0.5 g/dL (ref 0.4–0.6)
Beta 2: 0.3 g/dL (ref 0.2–0.5)
GAMMA GLOBULIN: 1.1 g/dL (ref 0.8–1.7)
Total Protein, Serum Electrophoresis: 7.4 g/dL (ref 6.1–8.1)

## 2014-11-16 LAB — PROTEIN ELECTROPHORESIS, URINE REFLEX
ALPHA-2-GLOBULIN, U: 15.8 %
Albumin: 36.4 %
Alpha-1-Globulin, U: 23.5 %
BETA GLOBULIN, U: 16.1 %
Gamma Globulin, U: 8.2 %
Total Protein, Urine: 164 mg/dL

## 2014-11-19 ENCOUNTER — Encounter: Payer: Self-pay | Admitting: *Deleted

## 2014-11-19 ENCOUNTER — Telehealth: Payer: Self-pay | Admitting: *Deleted

## 2014-11-19 ENCOUNTER — Other Ambulatory Visit: Payer: PPO

## 2014-11-19 ENCOUNTER — Other Ambulatory Visit: Payer: Self-pay | Admitting: Family

## 2014-11-19 DIAGNOSIS — E559 Vitamin D deficiency, unspecified: Secondary | ICD-10-CM

## 2014-11-19 HISTORY — DX: Vitamin D deficiency, unspecified: E55.9

## 2014-11-19 MED ORDER — VITAMIN D (ERGOCALCIFEROL) 1.25 MG (50000 UNIT) PO CAPS: 50000.0000 [IU] | ORAL_CAPSULE | ORAL | Status: DC

## 2014-11-19 NOTE — Telephone Encounter (Signed)
Vitamin D level is low.  Advise patient to begin vit D 50000 units once weekly for 12 weeks, then repeat vit D level (dx Vit D deficiency).    Blood and urine testing otherwise look good.

## 2014-11-19 NOTE — Addendum Note (Signed)
Addended by: Kelle Darting A on: 11/19/2014 02:11 PM   Modules accepted: Orders

## 2014-11-19 NOTE — Telephone Encounter (Signed)
Notified pt and he voices understanding.  Lab appt scheduled for 02/11/15 at 2pm.  Future lab order entered.

## 2014-11-19 NOTE — Telephone Encounter (Signed)
See 11/19/14 phone note.

## 2014-11-19 NOTE — Telephone Encounter (Signed)
Pt returned stool kit and wanted to know results of labs and urine testing. He states that he is feeling better. Please advise.

## 2014-11-20 LAB — CLOSTRIDIUM DIFFICILE BY PCR: CDIFFPCR: NOT DETECTED

## 2014-11-21 ENCOUNTER — Ambulatory Visit: Payer: PPO | Admitting: Family

## 2014-11-21 LAB — OVA AND PARASITE EXAMINATION: OP: NONE SEEN

## 2014-11-23 LAB — STOOL CULTURE

## 2014-11-25 ENCOUNTER — Telehealth: Payer: Self-pay | Admitting: Family

## 2014-11-25 MED ORDER — FLUCONAZOLE 150 MG PO TABS: ORAL_TABLET | ORAL | Status: DC

## 2014-11-25 NOTE — Telephone Encounter (Signed)
There is some yeast in his stool which may be contributing to his diarrhea.  I would like him to take diflucan. Do not take lipitor on days he takes diflucan or the day after he takes diflucan due to drug interaction.

## 2014-11-26 NOTE — Telephone Encounter (Signed)
Notified pt and he voices understanding. Rx faxed to pharmacy.

## 2014-12-10 ENCOUNTER — Telehealth: Payer: Self-pay | Admitting: Family

## 2014-12-10 MED ORDER — HYDROCODONE-ACETAMINOPHEN 7.5-325 MG PO TABS: 1.0000 | ORAL_TABLET | Freq: Three times a day (TID) | ORAL | Status: DC | PRN

## 2014-12-10 NOTE — Telephone Encounter (Signed)
Rx placed at front desk for pick up. Left detailed message on below# and to call if any questions.

## 2014-12-10 NOTE — Telephone Encounter (Signed)
Rx printed and forwarded to PCP for signature. 

## 2014-12-10 NOTE — Telephone Encounter (Signed)
Caller name:Goldman Relationship to patient:self Can be [phone] Pharmacy:  Reason for call:needs to pick up written rx for norco

## 2014-12-13 ENCOUNTER — Telehealth: Payer: Self-pay | Admitting: Family

## 2014-12-13 NOTE — Telephone Encounter (Signed)
-----   Message from Mosie Lukes, MD sent at 11/20/2014  8:06 AM EST ----- Tough one. Would probably send to urology for hematuria in setting of kidney stones and weight loss. Also get a PTH due to hypercalcemia but no good reason for weight loss that I see, bring him back after urology for weight recheck and further consideration. ----- Message -----    From: [name], NP    Sent: 11/18/2014   1:40 PM      To: Mosie Lukes, MD  Could we please discuss this case this week? Thanks!

## 2014-12-24 ENCOUNTER — Telehealth: Payer: Self-pay | Admitting: Family

## 2014-12-24 NOTE — Telephone Encounter (Signed)
Caller name: Self   Can be reached: [phone]   Reason for call: Request refill on HYDROcodone-acetaminophen (NORCO) 7.5-325 MG tablet [id number]

## 2014-12-24 NOTE — Telephone Encounter (Signed)
Spoke with pt and verified that he is not out and should not be due to refill medication until 01/09/15.  Pt was very confused on the phone regarding the month and states he thought we were already in January.

## 2014-12-27 ENCOUNTER — Other Ambulatory Visit: Payer: Self-pay | Admitting: Family

## 2014-12-27 NOTE — Telephone Encounter (Signed)
Caller name: Simmon  Relationship to patient: Self  Can be reached: [phone]  Reason for call: pt is requesting a refill on his HYDROcodone Rx. He says that he is almost out.  Pt is requesting a call back when ready for pick up.

## 2014-12-28 MED ORDER — HYDROCODONE-ACETAMINOPHEN 7.5-325 MG PO TABS: 1.0000 | ORAL_TABLET | Freq: Three times a day (TID) | ORAL | Status: DC | PRN

## 2014-12-28 NOTE — Telephone Encounter (Signed)
Yes

## 2014-12-28 NOTE — Telephone Encounter (Signed)
Rx printed and forwarded to PCP for signature. 

## 2014-12-28 NOTE — Telephone Encounter (Signed)
Rx was signed and placed at front desk for pick up at 1:20 today. Pt was notified per [facility], Jennifer.

## 2014-12-28 NOTE — Telephone Encounter (Signed)
Pt will be due for refill on 01/09/15.  Is it ok to print Rx and add note for pharmacy not to fill RX before that date?

## 2014-12-31 ENCOUNTER — Emergency Department (HOSPITAL_COMMUNITY): Payer: PPO

## 2014-12-31 ENCOUNTER — Encounter (HOSPITAL_COMMUNITY): Payer: Self-pay | Admitting: Emergency Medicine

## 2014-12-31 ENCOUNTER — Emergency Department (HOSPITAL_COMMUNITY)
Admission: EM | Admit: 2014-12-31 | Discharge: 2014-12-31 | Disposition: A | Discharge status: Home / Self Care | Payer: PPO | Attending: Emergency Medicine | Admitting: Emergency Medicine

## 2014-12-31 DIAGNOSIS — E860 Dehydration: Secondary | ICD-10-CM | POA: Diagnosis not present

## 2014-12-31 DIAGNOSIS — E785 Hyperlipidemia, unspecified: Secondary | ICD-10-CM | POA: Diagnosis not present

## 2014-12-31 DIAGNOSIS — G4733 Obstructive sleep apnea (adult) (pediatric): Secondary | ICD-10-CM | POA: Diagnosis not present

## 2014-12-31 DIAGNOSIS — G8929 Other chronic pain: Secondary | ICD-10-CM | POA: Insufficient documentation

## 2014-12-31 DIAGNOSIS — Z79899 Other long term (current) drug therapy: Secondary | ICD-10-CM | POA: Diagnosis not present

## 2014-12-31 DIAGNOSIS — Z87891 Personal history of nicotine dependence: Secondary | ICD-10-CM | POA: Diagnosis not present

## 2014-12-31 DIAGNOSIS — M1990 Unspecified osteoarthritis, unspecified site: Secondary | ICD-10-CM | POA: Insufficient documentation

## 2014-12-31 DIAGNOSIS — Z7982 Long term (current) use of aspirin: Secondary | ICD-10-CM | POA: Diagnosis not present

## 2014-12-31 DIAGNOSIS — I10 Essential (primary) hypertension: Secondary | ICD-10-CM | POA: Insufficient documentation

## 2014-12-31 DIAGNOSIS — E559 Vitamin D deficiency, unspecified: Secondary | ICD-10-CM | POA: Diagnosis not present

## 2014-12-31 DIAGNOSIS — R51 Headache: Secondary | ICD-10-CM | POA: Insufficient documentation

## 2014-12-31 DIAGNOSIS — Z87442 Personal history of urinary calculi: Secondary | ICD-10-CM | POA: Diagnosis not present

## 2014-12-31 DIAGNOSIS — Z794 Long term (current) use of insulin: Secondary | ICD-10-CM | POA: Insufficient documentation

## 2014-12-31 DIAGNOSIS — E119 Type 2 diabetes mellitus without complications: Secondary | ICD-10-CM | POA: Diagnosis not present

## 2014-12-31 DIAGNOSIS — F419 Anxiety disorder, unspecified: Secondary | ICD-10-CM | POA: Insufficient documentation

## 2014-12-31 DIAGNOSIS — Z9889 Other specified postprocedural states: Secondary | ICD-10-CM | POA: Insufficient documentation

## 2014-12-31 DIAGNOSIS — R531 Weakness: Secondary | ICD-10-CM

## 2014-12-31 DIAGNOSIS — F329 Major depressive disorder, single episode, unspecified: Secondary | ICD-10-CM | POA: Insufficient documentation

## 2014-12-31 LAB — COMPREHENSIVE METABOLIC PANEL
ALBUMIN: 4.3 g/dL (ref 3.5–5.0)
ALT: 18 U/L (ref 17–63)
AST: 20 U/L (ref 15–41)
Alkaline Phosphatase: 51 U/L (ref 38–126)
Anion gap: 11 (ref 5–15)
BILIRUBIN TOTAL: 0.8 mg/dL (ref 0.3–1.2)
BUN: 31 mg/dL — AB (ref 6–20)
CHLORIDE: 103 mmol/L (ref 101–111)
CO2: 24 mmol/L (ref 22–32)
CREATININE: 1.13 mg/dL (ref 0.61–1.24)
Calcium: 9.9 mg/dL (ref 8.9–10.3)
GFR calc Af Amer: >60 mL/min (ref >60)
GFR calc non Af Amer: >60 mL/min (ref >60)
GLUCOSE: 144 mg/dL — AB (ref 65–99)
POTASSIUM: 3.8 mmol/L (ref 3.5–5.1)
Sodium: 138 mmol/L (ref 135–145)
Total Protein: 7.6 g/dL (ref 6.5–8.1)

## 2014-12-31 LAB — CBC
HCT: 42.2 % (ref 39.0–52.0)
HEMOGLOBIN: 13.9 g/dL (ref 13.0–17.0)
MCH: 29 pg (ref 26.0–34.0)
MCHC: 32.9 g/dL (ref 30.0–36.0)
MCV: 88.1 fL (ref 78.0–100.0)
PLATELETS: 279 10*3/uL (ref 150–400)
RBC: 4.79 MIL/uL (ref 4.22–5.81)
RDW: 13.3 % (ref 11.5–15.5)
WBC: 10.2 10*3/uL (ref 4.0–10.5)

## 2014-12-31 LAB — URINE MICROSCOPIC-ADD ON

## 2014-12-31 LAB — APTT: aPTT: 28 seconds (ref 24–37)

## 2014-12-31 LAB — RAPID URINE DRUG SCREEN, HOSP PERFORMED
AMPHETAMINES: NOT DETECTED
Barbiturates: NOT DETECTED
Benzodiazepines: POSITIVE — AB
Cocaine: NOT DETECTED
Opiates: POSITIVE — AB
TETRAHYDROCANNABINOL: NOT DETECTED

## 2014-12-31 LAB — URINALYSIS, ROUTINE W REFLEX MICROSCOPIC
BILIRUBIN URINE: NEGATIVE
Glucose, UA: NEGATIVE mg/dL
Ketones, ur: NEGATIVE mg/dL
Leukocytes, UA: NEGATIVE
Nitrite: NEGATIVE
PH: 5.5 (ref 5.0–8.0)
Protein, ur: 30 mg/dL — AB
SPECIFIC GRAVITY, URINE: 1.025 (ref 1.005–1.030)

## 2014-12-31 LAB — I-STAT CG4 LACTIC ACID, ED
LACTIC ACID, VENOUS: 2.24 mmol/L — AB (ref 0.5–2.0)
Lactic Acid, Venous: 3.02 mmol/L (ref 0.5–2.0)

## 2014-12-31 LAB — DIFFERENTIAL
BASOS PCT: 1 %
Basophils Absolute: 0.1 10*3/uL (ref 0.0–0.1)
EOS ABS: 0.1 10*3/uL (ref 0.0–0.7)
EOS PCT: 1 %
LYMPHS PCT: 30 %
Lymphs Abs: 3.1 10*3/uL (ref 0.7–4.0)
MONO ABS: 0.6 10*3/uL (ref 0.1–1.0)
Monocytes Relative: 6 %
Neutro Abs: 6.3 10*3/uL (ref 1.7–7.7)
Neutrophils Relative %: 62 %

## 2014-12-31 LAB — TROPONIN I: Troponin I: <0.03 ng/mL (ref <0.031)

## 2014-12-31 LAB — ETHANOL: Alcohol, Ethyl (B): <5 mg/dL (ref <5)

## 2014-12-31 LAB — PROTIME-INR
INR: 1.02 (ref 0.00–1.49)
PROTHROMBIN TIME: 13.6 s (ref 11.6–15.2)

## 2014-12-31 LAB — CK: Total CK: 73 U/L (ref 49–397)

## 2014-12-31 MED ORDER — SODIUM CHLORIDE 0.9 % IV BOLUS (SEPSIS)
1000.0000 mL | Freq: Once | INTRAVENOUS | Status: AC
  Administered 2014-12-31: 1000 mL via INTRAVENOUS

## 2014-12-31 NOTE — ED Notes (Signed)
States lethargy and altered mental status. Lethargy started 2 weeks ago with altered mental status noted by family members yesterday. Patient is alert/oriented.

## 2014-12-31 NOTE — ED Provider Notes (Signed)
CSN: [id number]     Arrival date & time 12/31/14  1433 History   First MD Initiated Contact with Patient 12/31/14 1505     Chief Complaint  Patient presents with  . Fatigue    Patient is a 74 y.o. male presenting with weakness. The history is provided by the patient and a relative.  Weakness This is a new problem. The current episode started more than 1 week ago. The problem occurs daily. The problem has been gradually worsening. Associated symptoms include headaches. Pertinent negatives include no chest pain, no abdominal pain and no shortness of breath. The symptoms are aggravated by walking. The symptoms are relieved by rest.  patient reports increasing fatigue for past 2 weeks He reports today he felt so fatigued he could not get out of bed He did have a fall in the past week but denies syncope No head injury No cp/sob No focal weakness No new meds He does take pain meds for chronic back pain but reports only taking 2/day  Son is at bedside but is not able to provide any other details. Past Medical History  Diagnosis Date  . Hypertension   . Hyperlipidemia   . History of gastric ulcer ~ 1958    no recurrence since.  . Hypogonadism male 01/11/2011  . Lung nodule     right 16 mm, seen initially 6/12. 55mm in 08/2011.  [name] GERD (gastroesophageal reflux disease)   . Fatty liver disease, nonalcoholic   . Kidney stones   . Squamous cell cancer of skin of forearm 01/23/2011    left  . Depression   . Type II diabetes mellitus ([place])   . History of blood transfusion ~ 1958    "related to bleeding ulcers"  . Daily headache     "here lately" (02/21/2014)  . Arthritis     "legs" (02/21/2014)  . Chronic lower back pain   . Anxiety   . OSA on CPAP   . Urinary hesitancy   . Vitamin D deficiency 11/19/2014   Past Surgical History  Procedure Laterality Date  . Melanoma excision Left 01/23/2011    forearm  . Left heart catheterization with coronary angiogram N/A 11/17/2012    Procedure:  LEFT HEART CATHETERIZATION WITH CORONARY ANGIOGRAM;  Surgeon: Burnell Blanks, MD;  Location[facility] CATH LAB;  Service: Cardiovascular;  Laterality: N/A;  . Cataract extraction w/ intraocular lens implant Left 08/2009    Dr Katy Fitch  . Cardiac catheterization     Family History  Problem Relation Age of Onset  . Arthritis Mother   . Heart disease Sister     Massive MI age 81.  . Arrhythmia Brother   . Melanoma Son   . Heart attack Brother    Social History  Substance Use Topics  . Smoking status: Former Smoker -- 1.00 packs/day for 25 years    Types: Cigarettes    Quit date: 01/05/1982  . Smokeless tobacco: Never Used  . Alcohol Use: Yes     Comment: "quit drinking in the 1980's"    Review of Systems  Constitutional: Positive for fatigue. Negative for fever.  Respiratory: Negative for shortness of breath.   Cardiovascular: Negative for chest pain.  Gastrointestinal: Negative for abdominal pain and blood in stool.  Neurological: Positive for weakness and headaches. Negative for syncope.  All other systems reviewed and are negative.     Allergies  Review of patient's allergies indicates no known allergies.  Home Medications   Prior to Admission medications  Medication Sig Start Date End Date Taking? Authorizing Provider  aspirin EC 81 MG tablet Take 81 mg by mouth every morning.     Historical Provider, MD  atorvastatin (LIPITOR) 40 MG tablet Take 1 tablet (40 mg total) by mouth daily. 08/17/14   [name], NP  diazepam (VALIUM) 10 MG tablet take 1 tablet by mouth at bedtime if needed for anxiety 10/08/14   [name], NP  feeding supplement, GLUCERNA SHAKE, (GLUCERNA SHAKE) LIQD Take 237 mLs by mouth 2 (two) times daily between meals. 11/09/14   [name], NP  finasteride (PROSCAR) 5 MG tablet Take 5 mg by mouth daily. 10/23/14   Historical Provider, MD  fish oil-omega-3 fatty acids 1000 MG capsule Take 2 g by mouth 2 (two) times daily.       Historical Provider, MD  fluconazole (DIFLUCAN) 150 MG tablet 2 tabs by mouth once daily for 3 days, then 2 tabs once in 1 week, and 2 tabs once in 2 weeks.  Hold lipitor(atorvastating on the days you take and 1 day after) 11/25/14   [name], NP  glucose blood test strip Check blood sugar daily and as needed. 02/26/14   [name], NP  hydrochlorothiazide (HYDRODIURIL) 25 MG tablet Take 1 tablet (25 mg total) by mouth daily. 08/17/14   [name], NP  HYDROcodone-acetaminophen (NORCO) 7.5-325 MG tablet Take 1 tablet by mouth 3 (three) times daily as needed. 12/28/14   [name], NP  insulin glargine (LANTUS) 100 UNIT/ML injection Inject 0.05 mLs (5 Units total) into the skin at bedtime. 11/09/14   [name], NP  Insulin Pen Needle 32G X 4 MM MISC Use with insulin injection daily.  DX  E11.9 11/06/14   [name], NP  losartan (COZAAR) 25 MG tablet Take 1 tablet (25 mg total) by mouth daily. 08/17/14   [name], NP  nitroGLYCERIN (NITROSTAT) 0.4 MG SL tablet Place 1 tablet (0.4 mg total) under the tongue every 5 (five) minutes x 3 doses as needed for chest pain (donot take with viagra). 04/17/14   [name], NP  PARoxetine (PAXIL) 30 MG tablet TAKE ONE TABLET BY MOUTH EVERY MORNING 08/17/14   [name], NP  tamsulosin (FLOMAX) 0.4 MG CAPS capsule Take 1 capsule (0.4 mg total) by mouth daily. 08/17/14   [name], NP  Vitamin D, Ergocalciferol, (DRISDOL) 50000 UNITS CAPS capsule Take 1 capsule (50,000 Units total) by mouth every 7 (seven) days. 11/19/14   [name], NP   BP 122/46 mmHg  Pulse 93  Temp(Src) 98 F (36.7 C) (Rectal)  Resp 14  Ht 6' (1.829 m)  SpO2 93% Physical Exam CONSTITUTIONAL: elderly, frail HEAD: Normocephalic/atraumatic EYES: EOMI/PERRL, conjunctiva pink ENMT: Mucous membranes dry NECK: supple no meningeal signs SPINE/BACK:entire spine nontender CV: S1/S2 noted, no  murmurs/rubs/gallops noted LUNGS: Lungs are clear to auscultation bilaterally, no apparent distress ABDOMEN: soft, nontender, no rebound or guarding, bowel sounds noted throughout abdomen GU:no cva tenderness NEURO: Pt is awake/alert/appropriate, moves all extremitiesx4.  No facial droop.  No arm or leg drift EXTREMITIES: pulses normal/equal, full ROM SKIN: warm, color normal PSYCH: no abnormalities of mood noted, alert and oriented to situation  ED Course  Procedures   4:00 PM Pt with fatigue for up to 2 weeks Unclear etiology Pt is hemodynamically stable (initial temp listed at 93, but rectal temp 98) Workup initiated CT head ordered as reports HA and weakness 5:33 PM Pt improved Initial workup unremarkable except for elevated lactate but he is  not febrile and not septic appearing IV fluids ordered Pt wishes to go home but agrees to wait for repeat lactate 8:05 PM Pt improved Repeat lactate at 2.2 He is ambulatory without difficulty He wishes to go home He has PCP f/u this week We discussed return precautions BP 132/64 mmHg  Pulse 79  Temp(Src) 98.2 F (36.8 C) (Oral)  Resp 14  Ht 6' (1.829 m)  SpO2 98%  Labs Review Labs Reviewed  COMPREHENSIVE METABOLIC PANEL - Abnormal; Notable for the following:    Glucose, Bld 144 (*)    BUN 31 (*)    All other components within normal limits  URINE RAPID DRUG SCREEN, HOSP PERFORMED - Abnormal; Notable for the following:    Opiates POSITIVE (*)    Benzodiazepines POSITIVE (*)    All other components within normal limits  URINALYSIS, ROUTINE W REFLEX MICROSCOPIC (NOT AT [facility]) - Abnormal; Notable for the following:    Hgb urine dipstick LARGE (*)    Protein, ur 30 (*)    All other components within normal limits  URINE MICROSCOPIC-ADD ON - Abnormal; Notable for the following:    Squamous Epithelial / LPF 0-5 (*)    Bacteria, UA FEW (*)    Crystals URIC ACID CRYSTALS (*)    All other components within normal limits  I-STAT  CG4 LACTIC ACID, ED - Abnormal; Notable for the following:    Lactic Acid, Venous 3.02 (*)    All other components within normal limits  CULTURE, BLOOD (ROUTINE X 2)  CULTURE, BLOOD (ROUTINE X 2)  ETHANOL  PROTIME-INR  APTT  CBC  DIFFERENTIAL  TROPONIN I  CK  CBG MONITORING, ED  I-STAT CG4 LACTIC ACID, ED    Imaging Review Dg Chest 1 View  12/31/2014  CLINICAL DATA:  Weakness, short of breath EXAM: CHEST 1 VIEW COMPARISON:  Radiograph 08/10/2014 FINDINGS: Exam is lordotic. There is low lung volumes and bibasilar atelectasis. No effusion, infiltrate, pneumothorax. IMPRESSION: Low lung volumes and  bibasilar atelectasis. Electronically Signed   By: Suzy Bouchard M.D.   On: 12/31/2014 16:35   Ct Head Wo Contrast  12/31/2014  CLINICAL DATA:  Lethargy.  Altered mental status EXAM: CT HEAD WITHOUT CONTRAST TECHNIQUE: Contiguous axial images were obtained from the base of the skull through the vertex without intravenous contrast. COMPARISON:  CT head 12/07/2012 FINDINGS: Moderate to advanced atrophy. Mild chronic microvascular ischemic change in the white matter. Negative for acute infarct.  Negative for hemorrhage or mass. Calvarium intact.  No skull lesion. IMPRESSION: Moderate to advanced atrophy.  No acute abnormality. Electronically Signed   By: Franchot Gallo M.D.   On: 12/31/2014 16:17   I have personally reviewed and evaluated these images and lab results as part of my medical decision-making.   EKG Interpretation   Date/Time:  Monday December 31 2014 14:51:04 EST Ventricular Rate:  96 PR Interval:  193 QRS Duration: 110 QT Interval:  381 QTC Calculation: 481 R Axis:   -44 Text Interpretation:  Sinus rhythm  Premature ventricular complexes Left  anterior fascicular block Abnormal R-wave progression, late transition  Confirmed by KOHUT  MD, STEPHEN ([id number]) on 12/31/2014 2:55:17 PM     Medications  sodium chloride 0.9 % bolus 1,000 mL (0 mLs Intravenous Stopped 12/31/14  1945)  sodium chloride 0.9 % bolus 1,000 mL (0 mLs Intravenous Stopped 12/31/14 1810)     MDM   Final diagnoses:  Weakness  Dehydration    Nursing notes including past medical history and  social history reviewed and considered in documentation Labs/vital reviewed myself and considered during evaluation     Ripley Fraise, MD 12/31/14 2006

## 2014-12-31 NOTE — ED Notes (Signed)
Pt did good ambulating in the hall way

## 2014-12-31 NOTE — Discharge Instructions (Signed)

## 2015-01-03 NOTE — Telephone Encounter (Signed)
Patient checking on the status of medication request, script is not at the front desk

## 2015-01-04 NOTE — Telephone Encounter (Signed)
Patient signed for Hydrocodone script and pick-up on Tuesday, 01/01/15 at front desk; Patient informed, understood & agreed to look for script, he was informed that last script was p/u on 12/10/14 and new Rx cannot be filled until 01/09/15 [30-days] and he was also informed when stating that "he didn't know if someone stole it" that we can only replace a controlled substance Rx with a valid police report, if stolen. Pt will call office on Tuesday [after Holiday] if he needs to follow-up with PCP regarding status of this prescription/SLS

## 2015-01-05 LAB — CULTURE, BLOOD (ROUTINE X 2)
CULTURE: NO GROWTH
Culture: NO GROWTH

## 2015-01-09 ENCOUNTER — Encounter: Payer: Self-pay | Admitting: Family

## 2015-01-09 ENCOUNTER — Telehealth: Payer: Self-pay | Admitting: Family

## 2015-01-09 ENCOUNTER — Ambulatory Visit (INDEPENDENT_AMBULATORY_CARE_PROVIDER_SITE_OTHER): Payer: PPO | Admitting: Family

## 2015-01-09 VITALS — BP 129/62 | HR 66 | Temp 97.9°F | Resp 16 | Ht 72.0 in | Wt 252.4 lb

## 2015-01-09 DIAGNOSIS — R3129 Other microscopic hematuria: Secondary | ICD-10-CM | POA: Diagnosis not present

## 2015-01-09 DIAGNOSIS — Z79899 Other long term (current) drug therapy: Secondary | ICD-10-CM | POA: Diagnosis not present

## 2015-01-09 DIAGNOSIS — Z79891 Long term (current) use of opiate analgesic: Secondary | ICD-10-CM | POA: Diagnosis not present

## 2015-01-09 LAB — POCT URINALYSIS DIPSTICK
Bilirubin, UA: NEGATIVE
GLUCOSE UA: NEGATIVE
Ketones, UA: NEGATIVE
LEUKOCYTES UA: NEGATIVE
Nitrite, UA: NEGATIVE
Spec Grav, UA: >=1.03
UROBILINOGEN UA: NEGATIVE
pH, UA: 6

## 2015-01-09 NOTE — Telephone Encounter (Signed)
Notified pt. He states he still has Valium and is not needing refill at present. Says he will call us when he needs a refill.

## 2015-01-09 NOTE — Telephone Encounter (Signed)
Upon further review of his chart it appears that his valium rx has not been filled since October. If he needs refill on valium he may have one. Thanks.

## 2015-01-09 NOTE — Progress Notes (Signed)
Subjective:    Patient ID: Christian Morris, male    DOB: 04/28/1940, 75 y.o.   MRN: [id number]  HPI  Christian Morris is a 75 yr old male who presents today due to issue with his controlled substances.  He presented to the front desk reporting that he does not have the prescriptions that he picked up last week (patient signed for the prescriptions at the front desk at time of pick up). Prior to picking up the rx's he told the CMA that he ran out early and he did not know why.  He reports that he has   Review of Systems    see HPI  Past Medical History  Diagnosis Date  . Hypertension   . Hyperlipidemia   . History of gastric ulcer ~ 1958    no recurrence since.  . Hypogonadism male 01/11/2011  . Lung nodule     right 16 mm, seen initially 6/12. 15mm in 08/2011.  [name] GERD (gastroesophageal reflux disease)   . Fatty liver disease, nonalcoholic   . Kidney stones   . Squamous cell cancer of skin of forearm 01/23/2011    left  . Depression   . Type II diabetes mellitus ([place])   . History of blood transfusion ~ 1958    "related to bleeding ulcers"  . Daily headache     "here lately" (02/21/2014)  . Arthritis     "legs" (02/21/2014)  . Chronic lower back pain   . Anxiety   . OSA on CPAP   . Urinary hesitancy   . Vitamin D deficiency 11/19/2014    Social History   Social History  . Marital Status: Divorced    Spouse Name: N/A  . Number of Children: 3  . Years of Education: N/A   Occupational History  . RETIRED BUS DRIVER    Social History Main Topics  . Smoking status: Former Smoker -- 1.00 packs/day for 25 years    Types: Cigarettes    Quit date: 01/05/1982  . Smokeless tobacco: Never Used  . Alcohol Use: Yes     Comment: "quit drinking in the 1980's"  . Drug Use: No  . Sexual Activity: Yes   Other Topics Concern  . 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    Past Surgical History    Procedure Laterality Date  . Melanoma excision Left 01/23/2011    forearm  . Left heart catheterization with coronary angiogram N/A 11/17/2012    Procedure: LEFT HEART CATHETERIZATION WITH CORONARY ANGIOGRAM;  Surgeon: Burnell Blanks, MD;  Location: [facility] CATH LAB;  Service: Cardiovascular;  Laterality: N/A;  . Cataract extraction w/ intraocular lens implant Left 08/2009    Dr Katy Fitch  . Cardiac catheterization      Family History  Problem Relation Age of Onset  . Arthritis Mother   . Heart disease Sister     Massive MI age 29.  . Arrhythmia Brother   . Melanoma Son   . Heart attack Brother     No Known Allergies  Current Outpatient Prescriptions on File Prior to Visit  Medication Sig Dispense Refill  . aspirin EC 81 MG tablet Take 81 mg by mouth every morning.     [name] atorvastatin (LIPITOR) 40 MG tablet Take 1 tablet (40 mg total) by mouth daily. 30 tablet 5  . diazepam (VALIUM) 10 MG tablet take 1 tablet  by mouth at bedtime if needed for anxiety (Patient taking differently: Take 10 mg by mouth at bedtime as needed for anxiety. take 1 tablet by mouth at bedtime if needed for anxiety) 30 tablet 0  . feeding supplement, GLUCERNA SHAKE, (GLUCERNA SHAKE) LIQD Take 237 mLs by mouth 2 (two) times daily between meals. 60 Can 2  . finasteride (PROSCAR) 5 MG tablet Take 5 mg by mouth daily.  11  . fish oil-omega-3 fatty acids 1000 MG capsule Take 2 g by mouth 2 (two) times daily.      [name] glucose blood test strip Check blood sugar daily and as needed. 100 each 12  . hydrochlorothiazide (HYDRODIURIL) 25 MG tablet Take 1 tablet (25 mg total) by mouth daily. 30 tablet 5  . HYDROcodone-acetaminophen (NORCO) 7.5-325 MG tablet Take 1 tablet by mouth 3 (three) times daily as needed. 90 tablet 0  . insulin glargine (LANTUS) 100 UNIT/ML injection Inject 0.05 mLs (5 Units total) into the skin at bedtime. 10 mL 5  . Insulin Pen Needle 32G X 4 MM MISC Use with insulin injection daily.  DX  E11.9 100  each 2  . losartan (COZAAR) 25 MG tablet Take 1 tablet (25 mg total) by mouth daily. 90 tablet 1  . metFORMIN (GLUCOPHAGE) 500 MG tablet TAKE 2 TABLETS BY MOUTH TWICE DAILY WIT H FOOD  4  . nitroGLYCERIN (NITROSTAT) 0.4 MG SL tablet Place 1 tablet (0.4 mg total) under the tongue every 5 (five) minutes x 3 doses as needed for chest pain (donot take with viagra). 60 tablet 12  . PARoxetine (PAXIL) 30 MG tablet TAKE ONE TABLET BY MOUTH EVERY MORNING 30 tablet 5  . tamsulosin (FLOMAX) 0.4 MG CAPS capsule Take 1 capsule (0.4 mg total) by mouth daily. 30 capsule 5  . Vitamin D, Ergocalciferol, (DRISDOL) 50000 UNITS CAPS capsule Take 1 capsule (50,000 Units total) by mouth every 7 (seven) days. 12 capsule 0   No current facility-administered medications on file prior to visit.    BP 129/62 mmHg  Pulse 66  Temp(Src) 97.9 F (36.6 C) (Oral)  Resp 16  Ht 6' (1.829 m)  Wt 252 lb 6.4 oz (114.488 kg)  BMI 34.22 kg/m2  SpO2 98%    Objective:   Physical Exam  Constitutional: He is oriented to person, place, and time. He appears well-developed and well-nourished. No distress.  HENT:  Head: Normocephalic and atraumatic.  Musculoskeletal: He exhibits no edema.  Neurological: He is alert and oriented to person, place, and time.  Skin: Skin is warm and dry.  Psychiatric: He has a normal mood and affect. His behavior is normal. Thought content normal.          Assessment & Plan:  Chronic pain-  Advised pt per controlled substance contract that he is responsible for his prescriptions once they are picked up from our office and that we do not provide early refills.  He reports that his last dose of hydrocodone was 2 weeks ago so he is outside of the window for withdrawal risk.  15 min spent with pt today.  >50% of this time was spent counseling pt on chronic pain management and our controlled substance policy.   Hx microscopic hematuria- We did check a urinalysis today to rule out UTI due to his  rather odd behavior recently.  UA + for blood which is not new. We will send for culture.

## 2015-01-09 NOTE — Patient Instructions (Signed)
Please call Christian Morris around 2/1 to request your refills for next month.

## 2015-01-10 LAB — CULTURE, URINE COMPREHENSIVE
Colony Count: NO GROWTH
Organism ID, Bacteria: NO GROWTH

## 2015-01-22 ENCOUNTER — Telehealth: Payer: Self-pay | Admitting: Family

## 2015-01-22 MED ORDER — METFORMIN HCL 500 MG PO TABS: ORAL_TABLET | ORAL | Status: DC

## 2015-01-22 MED ORDER — HYDROCODONE-ACETAMINOPHEN 7.5-325 MG PO TABS: 1.0000 | ORAL_TABLET | Freq: Three times a day (TID) | ORAL | Status: DC | PRN

## 2015-01-22 NOTE — Telephone Encounter (Signed)
Metformin refills sent to pharmacy. Please advise re: Hydrocodone request.

## 2015-01-22 NOTE — Telephone Encounter (Signed)
See hydrocodone request.

## 2015-01-22 NOTE — Telephone Encounter (Signed)
Notified pt that Rx can be picked up now but cannot be filled until 02/06/15. Pt became upset and wanted to know why he couldn't get it filled now. Reminded pt of recent office visit on 01/09/15 where we discussed why early refill would not be allowed (contract/policy). Pt upset/crying; states he is in pain every day. Explained to pt that we still cannot allow early refill and call was ended. Rx was placed at front desk for pick up. Notified PCP, she wants to make sure pt has an appt with [place] in the next month and will do memory testing at that visit. Pt has appt on 02/11/15 at 1:30pm (68min slot).

## 2015-01-22 NOTE — Telephone Encounter (Signed)
Caller name: Self  Can be reached: [phone]  Pharmacy:  [id number] - [place], [place]. [place] [place] [phone] (Phone) [phone] (Fax)        Reason for call: Request refill on metFORMIN (GLUCOPHAGE) 500 MG tablet [id number] FYI: Patient would also like pain medication for his back

## 2015-02-04 ENCOUNTER — Telehealth: Payer: Self-pay | Admitting: Family

## 2015-02-04 NOTE — Telephone Encounter (Signed)
Relation to [id number] Call back number:[phone]   Reason for call:   patient called to schedule an acute appointment for kidney stones patient had a 30 minute appointment for a follow up for Monday 02/11/15 and a lab appointment followed right after. Patient wanted to see NP Wednesday and stated if he sees NP Wednesday he will not come in on Monday. Please advise if lab appointment is necessary for Wednesday or can it be included in appointment. Please advise

## 2015-02-05 NOTE — Telephone Encounter (Signed)
Not sure if pt needs to be called about this or not but Christian Morris said ok to include on Wednesday appt but it looks like pt cancelled Wednesday appt and r/s for 02/11/15 again?[name]

## 2015-02-05 NOTE — Telephone Encounter (Signed)
We will include with appointment.

## 2015-02-06 ENCOUNTER — Ambulatory Visit: Payer: PPO | Admitting: Family

## 2015-02-08 ENCOUNTER — Telehealth: Payer: Self-pay | Admitting: *Deleted

## 2015-02-08 MED ORDER — DIAZEPAM 10 MG PO TABS: ORAL_TABLET | ORAL | Status: DC

## 2015-02-08 NOTE — Telephone Encounter (Signed)
Received fax from [facility] in [place] for diazepam 10mg  at bedtime. Last rx 10/2014, next UDS 04/09/15.  Rx printed and forwarded to PCP for signature.

## 2015-02-08 NOTE — Telephone Encounter (Signed)
Rx faxed to pharmacy  

## 2015-02-11 ENCOUNTER — Ambulatory Visit: Payer: PPO | Admitting: Family

## 2015-02-11 ENCOUNTER — Other Ambulatory Visit: Payer: PPO

## 2015-02-11 ENCOUNTER — Encounter: Payer: Self-pay | Admitting: Family

## 2015-02-11 ENCOUNTER — Ambulatory Visit (INDEPENDENT_AMBULATORY_CARE_PROVIDER_SITE_OTHER): Payer: PPO | Admitting: Family

## 2015-02-11 ENCOUNTER — Telehealth: Payer: Self-pay | Admitting: *Deleted

## 2015-02-11 VITALS — BP 140/70 | HR 64 | Temp 98.1°F | Resp 18 | Ht 72.0 in | Wt 250.8 lb

## 2015-02-11 DIAGNOSIS — M545 Low back pain: Secondary | ICD-10-CM

## 2015-02-11 DIAGNOSIS — R413 Other amnesia: Secondary | ICD-10-CM | POA: Diagnosis not present

## 2015-02-11 DIAGNOSIS — E559 Vitamin D deficiency, unspecified: Secondary | ICD-10-CM

## 2015-02-11 DIAGNOSIS — E1121 Type 2 diabetes mellitus with diabetic nephropathy: Secondary | ICD-10-CM | POA: Diagnosis not present

## 2015-02-11 DIAGNOSIS — E785 Hyperlipidemia, unspecified: Secondary | ICD-10-CM

## 2015-02-11 DIAGNOSIS — Z794 Long term (current) use of insulin: Secondary | ICD-10-CM | POA: Diagnosis not present

## 2015-02-11 DIAGNOSIS — G8929 Other chronic pain: Secondary | ICD-10-CM

## 2015-02-11 DIAGNOSIS — I10 Essential (primary) hypertension: Secondary | ICD-10-CM

## 2015-02-11 LAB — LIPID PANEL
Cholesterol: 130 mg/dL (ref 0–200)
HDL: 37.2 mg/dL — ABNORMAL LOW (ref >39.00)
NONHDL: 92.62
TRIGLYCERIDES: 213 mg/dL — AB (ref 0.0–149.0)
Total CHOL/HDL Ratio: 3
VLDL: 42.6 mg/dL — ABNORMAL HIGH (ref 0.0–40.0)

## 2015-02-11 LAB — BASIC METABOLIC PANEL
BUN: 28 mg/dL — ABNORMAL HIGH (ref 6–23)
CHLORIDE: 100 meq/L (ref 96–112)
CO2: 24 meq/L (ref 19–32)
CREATININE: 1.03 mg/dL (ref 0.40–1.50)
Calcium: 10.2 mg/dL (ref 8.4–10.5)
GFR: 74.55 mL/min (ref >60.00)
GLUCOSE: 124 mg/dL — AB (ref 70–99)
Potassium: 3.9 mEq/L (ref 3.5–5.1)
Sodium: 138 mEq/L (ref 135–145)

## 2015-02-11 LAB — HOMOCYSTEINE: HOMOCYSTEINE: 10.3 umol/L (ref <11.4)

## 2015-02-11 LAB — LDL CHOLESTEROL, DIRECT: Direct LDL: 59 mg/dL

## 2015-02-11 LAB — TSH: TSH: 0.61 u[IU]/mL (ref 0.35–4.50)

## 2015-02-11 LAB — FOLATE: Folate: 19.5 ng/mL (ref >5.9)

## 2015-02-11 LAB — VITAMIN D 25 HYDROXY (VIT D DEFICIENCY, FRACTURES): VITD: 36.71 ng/mL (ref 30.00–100.00)

## 2015-02-11 LAB — VITAMIN B12: VITAMIN B 12: 306 pg/mL (ref 211–911)

## 2015-02-11 LAB — HEMOGLOBIN A1C: Hgb A1c MFr Bld: 6.3 % (ref 4.6–6.5)

## 2015-02-11 MED ORDER — TAMSULOSIN HCL 0.4 MG PO CAPS: 0.4000 mg | ORAL_CAPSULE | Freq: Every day | ORAL | Status: DC

## 2015-02-11 MED ORDER — ATORVASTATIN CALCIUM 40 MG PO TABS: 40.0000 mg | ORAL_TABLET | Freq: Every day | ORAL | Status: DC

## 2015-02-11 MED ORDER — HYDROCHLOROTHIAZIDE 25 MG PO TABS: 25.0000 mg | ORAL_TABLET | Freq: Every day | ORAL | Status: DC

## 2015-02-11 MED ORDER — GLUCOSE BLOOD VI STRP: ORAL_STRIP | Status: DC

## 2015-02-11 MED ORDER — HYDROCODONE-ACETAMINOPHEN 7.5-325 MG PO TABS: 1.0000 | ORAL_TABLET | Freq: Three times a day (TID) | ORAL | Status: DC | PRN

## 2015-02-11 MED ORDER — LOSARTAN POTASSIUM 25 MG PO TABS: 25.0000 mg | ORAL_TABLET | Freq: Every day | ORAL | Status: DC

## 2015-02-11 MED ORDER — LIFESCAN UNISTIK II LANCETS MISC: Status: DC

## 2015-02-11 MED ORDER — PAROXETINE HCL 30 MG PO TABS: ORAL_TABLET | ORAL | Status: DC

## 2015-02-11 MED FILL — HYDROCODON-APAP 7.5-325: 7.5-325 | 30 days supply | Qty: 90 | Fill #0

## 2015-02-11 NOTE — Progress Notes (Signed)
Subjective:    Patient ID: Christian Morris, male    DOB: 09-08-1940, 75 y.o.   MRN: [id number]  HPI  Christian Morris is a 75 yr old male who presents today for follow up and memory testing. He reports that 2 weeks ago his son passed away.  1) Back Pain-  Reports that his back pain is "bad." reports that pain is "constant." reports that his pain is located in the lower back L>R.  Reports that he also has bilateral Knee pain.    2) Memory problems- Has been having trouble remembering phone conversations per CMA.   3) Hyperlipidemia- He is currently maintained on atorvastatin.  Lab Results  Component Value Date   CHOL 140 08/17/2014   HDL 37.30* 08/17/2014   LDLCALC 66 08/17/2014   TRIG 184.0* 08/17/2014   CHOLHDL 4 08/17/2014   4) HTN- Current BP meds include losartan.   BP Readings from Last 3 Encounters:  02/11/15 140/70  01/09/15 129/62  12/31/14 132/64   5) DM2- Pt is maintained on lantus and metformin.  Pt is not using insulin regularly.  He lost his machine.  Lab Results  Component Value Date   HGBA1C 6.5 08/17/2014   HGBA1C 6.5* 02/22/2014   HGBA1C 6.9* 12/27/2013   Lab Results  Component Value Date   MICROALBUR 11.4* 08/17/2014   LDLCALC 66 08/17/2014   CREATININE 1.13 12/31/2014      Review of Systems  Respiratory: Negative for cough.   Cardiovascular: Negative for chest pain and leg swelling.  Neurological:       Reports + HA's worse at night.    See HPI  Past Medical History  Diagnosis Date  . Hypertension   . Hyperlipidemia   . History of gastric ulcer ~ 1958    no recurrence since.  . Hypogonadism male 01/11/2011  . Lung nodule     right 16 mm, seen initially 6/12. 59mm in 08/2011.  [name] GERD (gastroesophageal reflux disease)   . Fatty liver disease, nonalcoholic   . Kidney stones   . Squamous cell cancer of skin of forearm 01/23/2011    left  . Depression   . Type II diabetes mellitus ([place])   . History of blood transfusion ~ 1958    "related to  bleeding ulcers"  . Daily headache     "here lately" (02/21/2014)  . Arthritis     "legs" (02/21/2014)  . Chronic lower back pain   . Anxiety   . OSA on CPAP   . Urinary hesitancy   . Vitamin D deficiency 11/19/2014    Social History   Social History  . Marital Status: Divorced    Spouse Name: N/A  . Number of Children: 3  . Years of Education: N/A   Occupational History  . RETIRED BUS DRIVER    Social History Main Topics  . Smoking status: Former Smoker -- 1.00 packs/day for 25 years    Types: Cigarettes    Quit date: 01/05/1982  . Smokeless tobacco: Never Used  . Alcohol Use: Yes     Comment: "quit drinking in the 1980's"  . Drug Use: No  . Sexual Activity: Yes   Other Topics Concern  . 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    Past Surgical History  Procedure Laterality Date  . Melanoma excision Left 01/23/2011    forearm  . Left  heart catheterization with coronary angiogram N/A 11/17/2012    Procedure: LEFT HEART CATHETERIZATION WITH CORONARY ANGIOGRAM;  Surgeon: Burnell Blanks, MD;  Location: [facility] CATH LAB;  Service: Cardiovascular;  Laterality: N/A;  . Cataract extraction w/ intraocular lens implant Left 08/2009    Dr Katy Fitch  . Cardiac catheterization      Family History  Problem Relation Age of Onset  . Arthritis Mother   . Heart disease Sister     Massive MI age 30.  . Arrhythmia Brother   . Melanoma Son   . Heart attack Brother     No Known Allergies  Current Outpatient Prescriptions on File Prior to Visit  Medication Sig Dispense Refill  . aspirin EC 81 MG tablet Take 81 mg by mouth every morning.     . diazepam (VALIUM) 10 MG tablet take 1 tablet by mouth at bedtime if needed for anxiety 30 tablet 0  . feeding supplement, GLUCERNA SHAKE, (GLUCERNA SHAKE) LIQD Take 237 mLs by mouth 2 (two) times daily between meals. 60 Can 2  . finasteride (PROSCAR) 5 MG  tablet Take 5 mg by mouth daily.  11  . fish oil-omega-3 fatty acids 1000 MG capsule Take 2 g by mouth 2 (two) times daily.      [name] glucose blood test strip Check blood sugar daily and as needed. 100 each 12  . HYDROcodone-acetaminophen (NORCO) 7.5-325 MG tablet Take 1 tablet by mouth 3 (three) times daily as needed. 90 tablet 0  . insulin glargine (LANTUS) 100 UNIT/ML injection Inject 0.05 mLs (5 Units total) into the skin at bedtime. 10 mL 5  . Insulin Pen Needle 32G X 4 MM MISC Use with insulin injection daily.  DX  E11.9 100 each 2  . metFORMIN (GLUCOPHAGE) 500 MG tablet TAKE 2 TABLETS BY MOUTH TWICE DAILY WIT H FOOD 120 tablet 2  . nitroGLYCERIN (NITROSTAT) 0.4 MG SL tablet Place 1 tablet (0.4 mg total) under the tongue every 5 (five) minutes x 3 doses as needed for chest pain (donot take with viagra). 60 tablet 12  . Vitamin D, Ergocalciferol, (DRISDOL) 50000 UNITS CAPS capsule Take 1 capsule (50,000 Units total) by mouth every 7 (seven) days. 12 capsule 0   No current facility-administered medications on file prior to visit.    BP 140/70 mmHg  Pulse 64  Temp(Src) 98.1 F (36.7 C) (Oral)  Resp 18  Ht 6' (1.829 m)  Wt 250 lb 12.8 oz (113.762 kg)  BMI 34.01 kg/m2  SpO2 100%       Objective:   Physical Exam  Constitutional: He is oriented to person, place, and time. He appears well-developed and well-nourished. No distress.  HENT:  Head: Normocephalic and atraumatic.  Cardiovascular: Normal rate and regular rhythm.   No murmur heard. Pulmonary/Chest: Effort normal and breath sounds normal. No respiratory distress. He has no wheezes. He has no rales.  Musculoskeletal: He exhibits no edema.  Neurological: He is alert and oriented to person, place, and time.  Skin: Skin is warm and dry.  Psychiatric: He has a normal mood and affect. His behavior is normal. Thought content normal.          Assessment & Plan:

## 2015-02-11 NOTE — Assessment & Plan Note (Signed)
Obtain lipid panel

## 2015-02-11 NOTE — Assessment & Plan Note (Signed)
Deteriorated, will order MRI of the lumbar spine for further evaluation.

## 2015-02-11 NOTE — Progress Notes (Signed)
Pre visit review using our clinic review tool, if applicable. No additional management support is needed unless otherwise documented below in the visit note. 

## 2015-02-11 NOTE — Assessment & Plan Note (Signed)
Pt scored 18/30 on MMSE today. He has an 8th grade education. Will obtain lab work to rule out reversible cause for his memory problems. Refer to neurology for further evaluation.

## 2015-02-11 NOTE — Addendum Note (Signed)
Addended by: Kelle Darting A on: 02/11/2015 01:32 PM   Modules accepted: Orders

## 2015-02-11 NOTE — Assessment & Plan Note (Signed)
Pt has been given a new glucose meter today.  Obtain A1C.

## 2015-02-11 NOTE — Assessment & Plan Note (Signed)
Obtain follow up vit D level.  

## 2015-02-11 NOTE — Patient Instructions (Addendum)
Please complete lab work prior to leaving. Check sugars 2 x a day. You will be contacted about your referral to neurology and about scheduling your MRI to look at your spine. Follow up in 3 months.

## 2015-02-11 NOTE — Telephone Encounter (Signed)
Hydrocodone Rx printed today was shredded as pt never picked up Rx written on 01/22/15 to be filled on 02/06/15. Original Rx given to pt today.

## 2015-02-11 NOTE — Assessment & Plan Note (Signed)
BP stable on current meds.   

## 2015-02-12 ENCOUNTER — Emergency Department (HOSPITAL_COMMUNITY): Payer: PPO

## 2015-02-12 ENCOUNTER — Encounter: Payer: Self-pay | Admitting: Family

## 2015-02-12 ENCOUNTER — Telehealth: Payer: Self-pay | Admitting: Family

## 2015-02-12 ENCOUNTER — Emergency Department (HOSPITAL_COMMUNITY)
Admission: EM | Admit: 2015-02-12 | Discharge: 2015-02-12 | Discharge status: Against Medical Advice | Payer: PPO | Attending: Emergency Medicine | Admitting: Emergency Medicine

## 2015-02-12 ENCOUNTER — Telehealth: Payer: Self-pay | Admitting: *Deleted

## 2015-02-12 ENCOUNTER — Encounter (HOSPITAL_COMMUNITY): Payer: Self-pay

## 2015-02-12 DIAGNOSIS — E785 Hyperlipidemia, unspecified: Secondary | ICD-10-CM | POA: Insufficient documentation

## 2015-02-12 DIAGNOSIS — Z794 Long term (current) use of insulin: Secondary | ICD-10-CM | POA: Insufficient documentation

## 2015-02-12 DIAGNOSIS — R51 Headache: Secondary | ICD-10-CM | POA: Insufficient documentation

## 2015-02-12 DIAGNOSIS — G4733 Obstructive sleep apnea (adult) (pediatric): Secondary | ICD-10-CM | POA: Diagnosis not present

## 2015-02-12 DIAGNOSIS — E559 Vitamin D deficiency, unspecified: Secondary | ICD-10-CM | POA: Diagnosis not present

## 2015-02-12 DIAGNOSIS — Z85828 Personal history of other malignant neoplasm of skin: Secondary | ICD-10-CM | POA: Insufficient documentation

## 2015-02-12 DIAGNOSIS — Z87442 Personal history of urinary calculi: Secondary | ICD-10-CM | POA: Insufficient documentation

## 2015-02-12 DIAGNOSIS — F329 Major depressive disorder, single episode, unspecified: Secondary | ICD-10-CM | POA: Insufficient documentation

## 2015-02-12 DIAGNOSIS — Z87438 Personal history of other diseases of male genital organs: Secondary | ICD-10-CM | POA: Insufficient documentation

## 2015-02-12 DIAGNOSIS — R079 Chest pain, unspecified: Secondary | ICD-10-CM | POA: Diagnosis not present

## 2015-02-12 DIAGNOSIS — E119 Type 2 diabetes mellitus without complications: Secondary | ICD-10-CM | POA: Insufficient documentation

## 2015-02-12 DIAGNOSIS — Z8719 Personal history of other diseases of the digestive system: Secondary | ICD-10-CM | POA: Diagnosis not present

## 2015-02-12 DIAGNOSIS — R413 Other amnesia: Secondary | ICD-10-CM | POA: Diagnosis not present

## 2015-02-12 DIAGNOSIS — F419 Anxiety disorder, unspecified: Secondary | ICD-10-CM | POA: Diagnosis not present

## 2015-02-12 DIAGNOSIS — Z9889 Other specified postprocedural states: Secondary | ICD-10-CM | POA: Insufficient documentation

## 2015-02-12 DIAGNOSIS — R5383 Other fatigue: Secondary | ICD-10-CM | POA: Diagnosis not present

## 2015-02-12 DIAGNOSIS — Z87891 Personal history of nicotine dependence: Secondary | ICD-10-CM | POA: Insufficient documentation

## 2015-02-12 DIAGNOSIS — G8929 Other chronic pain: Secondary | ICD-10-CM | POA: Insufficient documentation

## 2015-02-12 DIAGNOSIS — M1990 Unspecified osteoarthritis, unspecified site: Secondary | ICD-10-CM | POA: Insufficient documentation

## 2015-02-12 DIAGNOSIS — I10 Essential (primary) hypertension: Secondary | ICD-10-CM | POA: Diagnosis not present

## 2015-02-12 LAB — CBC WITH DIFFERENTIAL/PLATELET
Basophils Absolute: 0 10*3/uL (ref 0.0–0.1)
Basophils Relative: 0 %
EOS PCT: 1 %
Eosinophils Absolute: 0.1 10*3/uL (ref 0.0–0.7)
HCT: 44.6 % (ref 39.0–52.0)
HEMOGLOBIN: 15 g/dL (ref 13.0–17.0)
LYMPHS ABS: 3.2 10*3/uL (ref 0.7–4.0)
LYMPHS PCT: 28 %
MCH: 29.5 pg (ref 26.0–34.0)
MCHC: 33.6 g/dL (ref 30.0–36.0)
MCV: 87.8 fL (ref 78.0–100.0)
Monocytes Absolute: 0.9 10*3/uL (ref 0.1–1.0)
Monocytes Relative: 8 %
NEUTROS PCT: 63 %
Neutro Abs: 7 10*3/uL (ref 1.7–7.7)
Platelets: 263 10*3/uL (ref 150–400)
RBC: 5.08 MIL/uL (ref 4.22–5.81)
RDW: 12.6 % (ref 11.5–15.5)
WBC: 11.2 10*3/uL — AB (ref 4.0–10.5)

## 2015-02-12 LAB — RPR

## 2015-02-12 LAB — BASIC METABOLIC PANEL
ANION GAP: 12 (ref 5–15)
BUN: 23 mg/dL — ABNORMAL HIGH (ref 6–20)
CO2: 28 mmol/L (ref 22–32)
Calcium: 9.6 mg/dL (ref 8.9–10.3)
Chloride: 99 mmol/L — ABNORMAL LOW (ref 101–111)
Creatinine, Ser: 0.99 mg/dL (ref 0.61–1.24)
GLUCOSE: 105 mg/dL — AB (ref 65–99)
POTASSIUM: 3.6 mmol/L (ref 3.5–5.1)
Sodium: 139 mmol/L (ref 135–145)

## 2015-02-12 LAB — CBG MONITORING, ED: Glucose-Capillary: 84 mg/dL (ref 65–99)

## 2015-02-12 LAB — TROPONIN I

## 2015-02-12 MED ORDER — VITAMIN D3 75 MCG (3000 UT) PO TABS: 1.0000 | ORAL_TABLET | Freq: Every day | ORAL | Status: DC

## 2015-02-12 NOTE — ED Notes (Signed)
MD at bedside. 

## 2015-02-12 NOTE — ED Notes (Signed)
edp aware of bp  

## 2015-02-12 NOTE — Telephone Encounter (Signed)
Noted and agree. 

## 2015-02-12 NOTE — Telephone Encounter (Signed)
   Sugar looks good.  Your triglycerides are mildly elevated. Please work on avoiding concentrated sweets, and limiting white carbs (rice/bread/pasta/potatoes). Instead substitute whole grain versions with reasonable portions.  B12 level is normal.  Syphilis screen is negative.   Vit D looks good. He can switch over to vit D 3000 iu once daily.

## 2015-02-12 NOTE — ED Notes (Signed)
Pt reports "I can't stay awake."  Pt says has been feeling like this for 3 days.  Pt says he saw his PCP yesterday for same but can't remember what she told him.  Reports yesterday his bp was different in each arm.  Pt called pcp today and with same complaints and they advised him to come to the ER.  Denies any pain.

## 2015-02-12 NOTE — ED Notes (Signed)
Pt pulling monitor leads off stated he was leaving because no one had done anything for him. Pt waiting to go over for CT. States he is not having a CT. States he drove here nad he is driving home. EDP aware. Pt encouraged to stay by nurse and EDP  but pt refused

## 2015-02-12 NOTE — Telephone Encounter (Signed)
Pt called stating he is having trouble staying awake today and can't get awake. Pt checked BS while on the phone and reports reading of 103. Advised pt per PCP recommendation that he should be evaluated in the ER to investigate cause of excessive sleepiness. Advised pt to call 911 for transport and he voices understanding.

## 2015-02-12 NOTE — ED Provider Notes (Signed)
CSN: [id number]     Arrival date & time 02/12/15  1458 History   First MD Initiated Contact with Patient 02/12/15 1648     Chief Complaint  Patient presents with  . Fatigue     The history is provided by the patient.   patient presents with fatigue and difficulty with memory. Reportedly was seen at his primary care doctor yesterday for the same. Had some lab work which is overall reassuring. States he's not had any weight loss. Has had a decreased appetite. No fevers or chills. He'll have occasional headaches. States he just has trouble remembering some things. No localizing numbness or weakness. No confusion. No chest pain. No dysuria.  Past Medical History  Diagnosis Date  . Hypertension   . Hyperlipidemia   . History of gastric ulcer ~ 1958    no recurrence since.  . Hypogonadism male 01/11/2011  . Lung nodule     right 16 mm, seen initially 6/12. 20mm in 08/2011.  [name] GERD (gastroesophageal reflux disease)   . Fatty liver disease, nonalcoholic   . Kidney stones   . Squamous cell cancer of skin of forearm 01/23/2011    left  . Depression   . Type II diabetes mellitus ([place])   . History of blood transfusion ~ 1958    "related to bleeding ulcers"  . Daily headache     "here lately" (02/21/2014)  . Arthritis     "legs" (02/21/2014)  . Chronic lower back pain   . Anxiety   . OSA on CPAP   . Urinary hesitancy   . Vitamin D deficiency 11/19/2014   Past Surgical History  Procedure Laterality Date  . Melanoma excision Left 01/23/2011    forearm  . Left heart catheterization with coronary angiogram N/A 11/17/2012    Procedure: LEFT HEART CATHETERIZATION WITH CORONARY ANGIOGRAM;  Surgeon: Burnell Blanks, MD;  Location: [facility] CATH LAB;  Service: Cardiovascular;  Laterality: N/A;  . Cataract extraction w/ intraocular lens implant Left 08/2009    Dr Katy Fitch  . Cardiac catheterization     Family History  Problem Relation Age of Onset  . Arthritis Mother   . Heart disease Sister    Massive MI age 76.  . Arrhythmia Brother   . Melanoma Son   . Heart attack Brother    Social History  Substance Use Topics  . Smoking status: Former Smoker -- 1.00 packs/day for 25 years    Types: Cigarettes    Quit date: 01/05/1982  . Smokeless tobacco: Never Used  . Alcohol Use: Yes     Comment: "quit drinking in the 1980's"    Review of Systems  Constitutional: Positive for appetite change and fatigue. Negative for fever, activity change and unexpected weight change.  Eyes: Negative for pain.  Respiratory: Negative for chest tightness and shortness of breath.   Cardiovascular: Negative for chest pain and leg swelling.  Gastrointestinal: Negative for nausea, vomiting, abdominal pain and diarrhea.  Genitourinary: Negative for flank pain.  Musculoskeletal: Negative for back pain and neck stiffness.  Skin: Negative for rash.  Neurological: Positive for headaches. Negative for speech difficulty, weakness and numbness.  Psychiatric/Behavioral: Negative for behavioral problems.      Allergies  Review of patient's allergies indicates no known allergies.  Home Medications   Prior to Admission medications   Medication Sig Start Date End Date Taking? Authorizing Provider  aspirin EC 81 MG tablet Take 81 mg by mouth every morning.     Historical Provider, MD  atorvastatin (LIPITOR) 40 MG tablet Take 1 tablet (40 mg total) by mouth daily. 02/11/15   [name], NP  Cholecalciferol (VITAMIN D3) 3000 units TABS Take 1 tablet by mouth daily. 02/12/15   [name], NP  diazepam (VALIUM) 10 MG tablet take 1 tablet by mouth at bedtime if needed for anxiety 02/08/15   [name], NP  feeding supplement, GLUCERNA SHAKE, (GLUCERNA SHAKE) LIQD Take 237 mLs by mouth 2 (two) times daily between meals. 11/09/14   [name], NP  finasteride (PROSCAR) 5 MG tablet Take 5 mg by mouth daily. 10/23/14   Historical Provider, MD  fish oil-omega-3 fatty acids 1000 MG capsule Take  2 g by mouth 2 (two) times daily.      Historical Provider, MD  glucose blood (ONETOUCH VERIO) test strip Use to check blood sugar two times a day.  DX  E11.9 02/11/15   [name], NP  hydrochlorothiazide (HYDRODIURIL) 25 MG tablet Take 1 tablet (25 mg total) by mouth daily. 02/11/15   [name], NP  HYDROcodone-acetaminophen (NORCO) 7.5-325 MG tablet Take 1 tablet by mouth 3 (three) times daily as needed. 02/11/15   [name], NP  insulin glargine (LANTUS) 100 UNIT/ML injection Inject 0.05 mLs (5 Units total) into the skin at bedtime. 11/09/14   [name], NP  Insulin Pen Needle 32G X 4 MM MISC Use with insulin injection daily.  DX  E11.9 11/06/14   [name], NP  LIFESCAN UNISTIK II LANCETS MISC Use to check blood sugar two times a day.  DX  E11.9 02/11/15   [name], NP  losartan (COZAAR) 25 MG tablet Take 1 tablet (25 mg total) by mouth daily. 02/11/15   [name], NP  metFORMIN (GLUCOPHAGE) 500 MG tablet TAKE 2 TABLETS BY MOUTH TWICE DAILY WIT H FOOD 01/22/15   [name], NP  nitroGLYCERIN (NITROSTAT) 0.4 MG SL tablet Place 1 tablet (0.4 mg total) under the tongue every 5 (five) minutes x 3 doses as needed for chest pain (donot take with viagra). 04/17/14   [name], NP  PARoxetine (PAXIL) 30 MG tablet TAKE ONE TABLET BY MOUTH EVERY MORNING 02/11/15   [name], NP  tamsulosin (FLOMAX) 0.4 MG CAPS capsule Take 1 capsule (0.4 mg total) by mouth daily. 02/11/15   [name], NP   BP 139/67 mmHg  Pulse 59  Temp(Src) 98.2 F (36.8 C) (Oral)  Resp 16  Ht 6' (1.829 m)  Wt 250 lb (113.399 kg)  BMI 33.90 kg/m2  SpO2 99% Physical Exam  Constitutional: He is oriented to person, place, and time. He appears well-developed and well-nourished.  HENT:  Head: Normocephalic and atraumatic.  Eyes: Pupils are equal, round, and reactive to light.  Neck: Normal range of motion. No thyromegaly present.  Cardiovascular:  Normal rate, regular rhythm and normal heart sounds.   No murmur heard. Pulmonary/Chest: Effort normal and breath sounds normal.  Abdominal: Soft. Bowel sounds are normal. He exhibits no distension. There is tenderness.  Musculoskeletal: Normal range of motion. He exhibits no edema.  Neurological: He is alert and oriented to person, place, and time. No cranial nerve deficit.  Patient is awake and appropriate. Once her conversation did forget what he was talking about.  Skin: Skin is warm and dry.  Psychiatric: He has a normal mood and affect.  Nursing note and vitals reviewed.   ED Course  Procedures (including critical care time) Labs Review Labs Reviewed  CBC WITH DIFFERENTIAL/PLATELET - Abnormal; Notable for the following:  WBC 11.2 (*)    All other components within normal limits  TROPONIN I  BASIC METABOLIC PANEL  URINALYSIS, ROUTINE W REFLEX MICROSCOPIC (NOT AT [facility])  CBG MONITORING, ED    Imaging Review Dg Chest 2 View  02/12/2015  CLINICAL DATA:  75 year old presenting with 3 day history of intermittent chest pain and somnolence. Former smoker who quit in the 1980s. Personal history of melanoma. Current history of obstructive sleep apnea, hypertension, diabetes and hyperlipidemia. EXAM: CHEST  2 VIEW COMPARISON:  12/31/2014 and earlier, including CT chest 11/09/2014. FINDINGS: Cardiac silhouette upper normal in size, unchanged. Thoracic aorta mildly atherosclerotic, unchanged. Hilar and mediastinal contours otherwise unremarkable. Prominent paracardiac fat pad on the right. Calcified granuloma deep in the posterior right lower lobe identified on the prior CT is vaguely visible on the lateral chest x-ray. Lungs otherwise clear. Bronchovascular markings normal. No localized airspace consolidation. No pleural effusions. No pneumothorax. Normal pulmonary vascularity. Degenerative changes involving the thoracic spine. IMPRESSION: No acute cardiopulmonary disease. Electronically Signed    By: Evangeline Dakin M.D.   On: 02/12/2015 15:43   I have personally reviewed and evaluated these images and lab results as part of my medical decision-making.   EKG Interpretation None      MDM   Final diagnoses:  Memory loss    Patient memory loss and fatigue. Seen by primary care doctor yesterday and labs and no reviewed. Presented today. Chest x-ray reassuring. Some lab work done and was reassuring however patient was not willing to stay for head CT. Became somewhat agitated and left AMA.    Davonna Belling, MD 02/12/15 [phone]

## 2015-02-13 ENCOUNTER — Encounter: Payer: Self-pay | Admitting: Medical

## 2015-02-13 ENCOUNTER — Telehealth: Payer: Self-pay | Admitting: Family

## 2015-02-13 ENCOUNTER — Telehealth: Payer: Self-pay | Admitting: *Deleted

## 2015-02-13 ENCOUNTER — Ambulatory Visit (INDEPENDENT_AMBULATORY_CARE_PROVIDER_SITE_OTHER): Payer: PPO | Admitting: Medical

## 2015-02-13 VITALS — BP 110/70 | HR 85 | Temp 98.1°F | Ht 72.0 in | Wt 248.6 lb

## 2015-02-13 DIAGNOSIS — N419 Inflammatory disease of prostate, unspecified: Secondary | ICD-10-CM

## 2015-02-13 DIAGNOSIS — R350 Frequency of micturition: Secondary | ICD-10-CM | POA: Diagnosis not present

## 2015-02-13 DIAGNOSIS — R5383 Other fatigue: Secondary | ICD-10-CM

## 2015-02-13 LAB — POCT URINALYSIS DIPSTICK
BILIRUBIN UA: NEGATIVE
Glucose, UA: NEGATIVE
KETONES UA: NEGATIVE
LEUKOCYTES UA: NEGATIVE
Nitrite, UA: NEGATIVE
PH UA: 5
Protein, UA: 30
Urobilinogen, UA: 0.2

## 2015-02-13 MED ORDER — ONETOUCH LANCETS MISC: Status: DC

## 2015-02-13 MED ORDER — CIPROFLOXACIN HCL 500 MG PO TABS: 500.0000 mg | ORAL_TABLET | Freq: Two times a day (BID) | ORAL | Status: DC

## 2015-02-13 NOTE — Telephone Encounter (Signed)
Notified pt and he voices understanding. 

## 2015-02-13 NOTE — Telephone Encounter (Signed)
Received fax from [facility] stating insurance will not cover unistik lancets. Prefers one touch, freestyle or precision. New rx for onetouch lancets sent to pharmacy.

## 2015-02-13 NOTE — Telephone Encounter (Signed)
Spoke with pt, he states he has been very sleepy / hard to stay awake x 3 days. Advised pt per verbal from PCP that he should only take 1/2 tablet hydrocodone at a time as it may be too sedating for him since he restarted the medication. Pt states he has only been taking 1 tablet a day. Advised him to take 1/2 tablet at a time per PCP and needs office visit for further evaluation of somnolence and back pain today. Scheduled pt appt at 2pm with Percell Miller. Pt voices understanding.

## 2015-02-13 NOTE — Telephone Encounter (Signed)
Caller name: Self  Can be reached: [phone]  Reason for call: Patient can not stay awake and is having severe pain. Patient states he was here yesterday and was told to call today. Plse call back

## 2015-02-13 NOTE — Patient Instructions (Addendum)
For your fatigue will get labs to evaluate anemia level and wbc.   Will get ua, culture and psa today.(Due to frequent urination history)  Cut back on narcotic med as Lenna Sciara NP advised.  Follow up in 7-10 days or as needed

## 2015-02-13 NOTE — Progress Notes (Signed)
Subjective:    Patient ID: Christian Morris, male    DO: 04-Jul-1940, 75 y.o.   MRN: [id number]  HPI  Pt states recently somnolence during the day. For about 10-14 days.   No fever, no chills or sweats. Pt does report some frequent urination about 2 months ago. Pt indicates he did see a urologist in the past. He tooks ome medication and eventually he got better.(Pt states he was told to take one tablet a week and it took him little more than a month to feel better?)  No black or bloody stools. No nausea or vomiting.  Pt denies any cough.  No wheezing. Pt is diabetic. Recent a1c 6.3.  Pt ran out of pain meds and had been out per his report for 2 months. This Monday he restarted his pain medication. Pt was instructed to take 1/2 tab of pain medication today. In event this is causing sedation.(pt pcp made this adjustment today)  Pt went to the ED yesterday. cxr was negative. Pt left MA. Some labs done and reviewed today. Pt refused ct of his head.   Review of Systems  Constitutional: Positive for fatigue.  Respiratory: Negative for cough, chest tightness, shortness of breath and wheezing.   Cardiovascular: Negative for chest pain and palpitations.  Gastrointestinal: Negative for nausea, abdominal pain, diarrhea, constipation, blood in stool, anal bleeding and rectal pain.  Genitourinary: Positive for frequency.       Hx of see hpi.  Musculoskeletal: Positive for back pain.       Chronic.  Neurological: Negative for dizziness, syncope, speech difficulty, light-headedness and headaches.  Hematological: Negative for adenopathy. Does not bruise/bleed easily.  Psychiatric/Behavioral: Negative for behavioral problems, confusion and sleep disturbance. The patient is not nervous/anxious.     Past Medical History  Diagnosis Date  . Hypertension   . Hyperlipidemia   . History of gastric ulcer ~ 1958    no recurrence since.  . Hypogonadism male 01/11/2011  . Lung nodule     right 16 mm,  seen initially 6/12. 107mm in 08/2011.  [name] GERD (gastroesophageal reflux disease)   . Fatty liver disease, nonalcoholic   . Kidney stones   . Squamous cell cancer of skin of forearm 01/23/2011    left  . Depression   . Type II diabetes mellitus ([place])   . History of blood transfusion ~ 1958    "related to bleeding ulcers"  . Daily headache     "here lately" (02/21/2014)  . Arthritis     "legs" (02/21/2014)  . Chronic lower back pain   . Anxiety   . OSA on CPAP   . Urinary hesitancy   . Vitamin D deficiency 11/19/2014    Social History   Social History  . Marital Status: Divorced    Spouse Name: N/A  . Number of Children: 3  . Years of Education: N/A   Occupational History  . RETIRED BUS DRIVER    Social History Main Topics  . Smoking status: Former Smoker -- 1.00 packs/day for 25 years    Types: Cigarettes    Quit date: 01/05/1982  . Smokeless tobacco: Never Used  . Alcohol Use: Yes     Comment: "quit drinking in the 1980's"  . Drug Use: No  . Sexual Activity: Yes   Other Topics Concern  . 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  Past Surgical History  Procedure Laterality Date  . Melanoma excision Left 01/23/2011    forearm  . Left heart catheterization with coronary angiogram N/A 11/17/2012    Procedure: LEFT HEART CATHETERIZATION WITH CORONARY ANGIOGRAM;  Surgeon: Burnell Blanks, MD;  Location: [facility] CATH LAB;  Service: Cardiovascular;  Laterality: N/A;  . Cataract extraction w/ intraocular lens implant Left 08/2009    Dr Katy Fitch  . Cardiac catheterization      Family History  Problem Relation Age of Onset  . Arthritis Mother   . Heart disease Sister     Massive MI age 51.  . Arrhythmia Brother   . Melanoma Son   . Heart attack Brother     No Known Allergies  Current Outpatient Prescriptions on File Prior to Visit  Medication Sig Dispense Refill  . aspirin EC 81  MG tablet Take 81 mg by mouth every morning.     [name] atorvastatin (LIPITOR) 40 MG tablet Take 1 tablet (40 mg total) by mouth daily. 30 tablet 5  . Cholecalciferol (VITAMIN D3) 3000 units TABS Take 1 tablet by mouth daily. 30 tablet   . diazepam (VALIUM) 10 MG tablet take 1 tablet by mouth at bedtime if needed for anxiety 30 tablet 0  . feeding supplement, GLUCERNA SHAKE, (GLUCERNA SHAKE) LIQD Take 237 mLs by mouth 2 (two) times daily between meals. 60 Can 2  . finasteride (PROSCAR) 5 MG tablet Take 5 mg by mouth daily.  11  . fish oil-omega-3 fatty acids 1000 MG capsule Take 2 g by mouth 2 (two) times daily.      [name] glucose blood (ONETOUCH VERIO) test strip Use to check blood sugar two times a day.  DX  E11.9 100 each 5  . hydrochlorothiazide (HYDRODIURIL) 25 MG tablet Take 1 tablet (25 mg total) by mouth daily. 30 tablet 5  . HYDROcodone-acetaminophen (NORCO) 7.5-325 MG tablet Take 1 tablet by mouth 3 (three) times daily as needed. 90 tablet 0  . insulin glargine (LANTUS) 100 UNIT/ML injection Inject 0.05 mLs (5 Units total) into the skin at bedtime. 10 mL 5  . Insulin Pen Needle 32G X 4 MM MISC Use with insulin injection daily.  DX  E11.9 100 each 2  . losartan (COZAAR) 25 MG tablet Take 1 tablet (25 mg total) by mouth daily. 90 tablet 1  . metFORMIN (GLUCOPHAGE) 500 MG tablet TAKE 2 TABLETS BY MOUTH TWICE DAILY WIT H FOOD 120 tablet 2  . nitroGLYCERIN (NITROSTAT) 0.4 MG SL tablet Place 1 tablet (0.4 mg total) under the tongue every 5 (five) minutes x 3 doses as needed for chest pain (donot take with viagra). 60 tablet 12  . PARoxetine (PAXIL) 30 MG tablet TAKE ONE TABLET BY MOUTH EVERY MORNING 30 tablet 5  . tamsulosin (FLOMAX) 0.4 MG CAPS capsule Take 1 capsule (0.4 mg total) by mouth daily. 30 capsule 5   No current facility-administered medications on file prior to visit.    BP 110/70 mmHg  Pulse 85  Temp(Src) 98.1 F (36.7 C) (Oral)  Ht 6' (1.829 m)  Wt 248 lb 9.6 oz (112.764 kg)  BMI  33.71 kg/m2  SpO2 97%       Objective:   Physical Exam  General  Mental Status - Alert. General Appearance - Well groomed. Not in acute distress. Mild fatigued appearance.  Skin Rashes- No Rashes. No tenting of skin.  HEENT Head- Normal. Ear Auditory Canal - Left- Normal. Right - Normal.Tympanic Membrane- Left- Normal. Right- Normal.  Eye Sclera/Conjunctiva- Left- Normal. Right- Normal. Nose & Sinuses Nasal Mucosa- Left-  Not boggy or Congested. Right-  Not  boggy or Congested. Mouth & Throat Lips: Upper Lip- Normal: no dryness, cracking, pallor, cyanosis, or vesicular eruption. Lower Lip-Normal: no dryness, cracking, pallor, cyanosis or vesicular eruption. Buccal Mucosa- Bilateral- No Aphthous ulcers. Oropharynx- No Discharge or Erythema. Tonsils: Characteristics- Bilateral- No Erythema or Congestion. Size/Enlargement- Bilateral- No enlargement. Discharge- bilateral-None.  Neck Neck- Supple. No Masses.   Chest and Lung Exam Auscultation: Breath Sounds:- even and unlabored  Cardiovascular Auscultation:Rythm- Regular, rate and rhythm. Murmurs & Other Heart Sounds:Ausculatation of the heart reveal- No Murmurs.  Lymphatic Head & Neck General Head & Neck Lymphatics: Bilateral: Description- No Localized lymphadenopathy.   Abdomen Inspection:-Inspection Normal.  Palpation/Perucssion: Palpation and Percussion of the abdomen reveal- Non Tender, No Rebound tenderness, No rigidity(Guarding) and No Palpable abdominal masses.  Liver:-Normal.  Spleen:- Normal.   Back- no cva tenderness         Assessment & Plan:  For your fatigue will get labs to evaluate anemia level and wbc.   Will get ua, culture and psa today.(Due to frequent urination history)  Cut back on narcotic med as Lenna Sciara NP advised.  Follow up in 7-10 days or as needed  If your symptoms change or worsen notify us. Did not do bmp today since was done yesterday.  Pt had 3+ blood on his urine dip. I  did send cipro antibiotic to his pharmacy. I called him and notified of the results of urine and antibiotic sent in.

## 2015-02-13 NOTE — Progress Notes (Signed)
Pre visit review using our clinic review tool, if applicable. No additional management support is needed unless otherwise documented below in the visit note. 

## 2015-02-14 LAB — CBC WITH DIFFERENTIAL/PLATELET
BASOS ABS: 0.1 10*3/uL (ref 0.0–0.1)
BASOS PCT: 0.7 % (ref 0.0–3.0)
EOS ABS: 0.1 10*3/uL (ref 0.0–0.7)
Eosinophils Relative: 0.7 % (ref 0.0–5.0)
HEMATOCRIT: 45 % (ref 39.0–52.0)
HEMOGLOBIN: 14.8 g/dL (ref 13.0–17.0)
LYMPHS PCT: 22.3 % (ref 12.0–46.0)
Lymphs Abs: 2.7 10*3/uL (ref 0.7–4.0)
MCHC: 33 g/dL (ref 30.0–36.0)
MCV: 87.9 fl (ref 78.0–100.0)
MONO ABS: 0.9 10*3/uL (ref 0.1–1.0)
Monocytes Relative: 7.6 % (ref 3.0–12.0)
Neutro Abs: 8.4 10*3/uL — ABNORMAL HIGH (ref 1.4–7.7)
Neutrophils Relative %: 68.7 % (ref 43.0–77.0)
Platelets: 282 10*3/uL (ref 150.0–400.0)
RBC: 5.11 Mil/uL (ref 4.22–5.81)
RDW: 12.9 % (ref 11.5–15.5)
WBC: 12.2 10*3/uL — AB (ref 4.0–10.5)

## 2015-02-14 LAB — PSA: PSA: 1.23 ng/mL (ref 0.10–4.00)

## 2015-02-15 LAB — URINE CULTURE
Colony Count: NO GROWTH
Organism ID, Bacteria: NO GROWTH

## 2015-02-20 ENCOUNTER — Encounter: Payer: Self-pay | Admitting: Family

## 2015-02-20 ENCOUNTER — Ambulatory Visit (INDEPENDENT_AMBULATORY_CARE_PROVIDER_SITE_OTHER): Payer: PPO | Admitting: Family

## 2015-02-20 VITALS — BP 108/68 | HR 108 | Temp 97.7°F | Ht 72.0 in | Wt 246.8 lb

## 2015-02-20 DIAGNOSIS — R319 Hematuria, unspecified: Secondary | ICD-10-CM

## 2015-02-20 DIAGNOSIS — R109 Unspecified abdominal pain: Secondary | ICD-10-CM

## 2015-02-20 DIAGNOSIS — G4733 Obstructive sleep apnea (adult) (pediatric): Secondary | ICD-10-CM | POA: Diagnosis not present

## 2015-02-20 DIAGNOSIS — M545 Low back pain: Secondary | ICD-10-CM | POA: Diagnosis not present

## 2015-02-20 DIAGNOSIS — G8929 Other chronic pain: Secondary | ICD-10-CM

## 2015-02-20 DIAGNOSIS — Z9989 Dependence on other enabling machines and devices: Secondary | ICD-10-CM

## 2015-02-20 MED ORDER — INSULIN GLARGINE 100 UNIT/ML ~~LOC~~ SOLN: 5.0000 [IU] | Freq: Every day | SUBCUTANEOUS | Status: DC

## 2015-02-20 MED ORDER — INSULIN PEN NEEDLE 32G X 4 MM MISC: Status: DC

## 2015-02-20 NOTE — Patient Instructions (Signed)
It is OK to take hydrocodone 1/2 tab 3 times daily as needed for back pain.  Let me know if you become more sleepy after you restart the hydrocodone. We will work on getting CPAP (mask) set back up for you.

## 2015-02-20 NOTE — Progress Notes (Signed)
Pre visit review using our clinic review tool, if applicable. No additional management support is needed unless otherwise documented below in the visit note. 

## 2015-02-20 NOTE — Progress Notes (Addendum)
Subjective:    Patient ID: Christian Morris, male    DOB: Jul 14, 1940, 75 y.o.   MRN: [id number]  HPI  Christian Morris is a 75 yr old male who presents today for follow up.  Back Pain- chronic. Stopped hydrocodone due to somnolence, did not try lesser dose.  Back pain is persistent. Reports back pain is his typical back pain and has not changed.  Fatigue- Pt reports fatigue is ongoing.  He reports that he has not used his CPAP in 2 years.  No longer has his machine.   Microscopic Hematuria- has been evaluated by urology. Has known hx of nephrolithiasis.   Review of Systems See HPI  Past Medical History  Diagnosis Date  . Hypertension   . Hyperlipidemia   . History of gastric ulcer ~ 1958    no recurrence since.  . Hypogonadism male 01/11/2011  . Lung nodule     right 16 mm, seen initially 6/12. 86mm in 08/2011.  [name] GERD (gastroesophageal reflux disease)   . Fatty liver disease, nonalcoholic   . Kidney stones   . Squamous cell cancer of skin of forearm 01/23/2011    left  . Depression   . Type II diabetes mellitus ([place])   . History of blood transfusion ~ 1958    "related to bleeding ulcers"  . Daily headache     "here lately" (02/21/2014)  . Arthritis     "legs" (02/21/2014)  . Chronic lower back pain   . Anxiety   . OSA on CPAP   . Urinary hesitancy   . Vitamin D deficiency 11/19/2014    Social History   Social History  . Marital Status: Divorced    Spouse Name: N/A  . Number of Children: 3  . Years of Education: N/A   Occupational History  . RETIRED BUS DRIVER    Social History Main Topics  . Smoking status: Former Smoker -- 1.00 packs/day for 25 years    Types: Cigarettes    Quit date: 01/05/1982  . Smokeless tobacco: Never Used  . Alcohol Use: Yes     Comment: "quit drinking in the 1980's"  . Drug Use: No  . Sexual Activity: Yes   Other Topics Concern  . 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    Past Surgical History  Procedure Laterality Date  . Melanoma excision Left 01/23/2011    forearm  . Left heart catheterization with coronary angiogram N/A 11/17/2012    Procedure: LEFT HEART CATHETERIZATION WITH CORONARY ANGIOGRAM;  Surgeon: Burnell Blanks, MD;  Location: [facility] CATH LAB;  Service: Cardiovascular;  Laterality: N/A;  . Cataract extraction w/ intraocular lens implant Left 08/2009    Dr Katy Fitch  . Cardiac catheterization      Family History  Problem Relation Age of Onset  . Arthritis Mother   . Heart disease Sister     Massive MI age 51.  . Arrhythmia Brother   . Melanoma Son   . Heart attack Brother     No Known Allergies  Current Outpatient Prescriptions on File Prior to Visit  Medication Sig Dispense Refill  . aspirin EC 81 MG tablet Take 81 mg by mouth every morning.     [name] atorvastatin (LIPITOR) 40 MG tablet Take 1 tablet (40 mg total) by mouth daily. 30 tablet 5  . Cholecalciferol (VITAMIN D3) 3000 units TABS Take  1 tablet by mouth daily. 30 tablet   . ciprofloxacin (CIPRO) 500 MG tablet Take 1 tablet (500 mg total) by mouth 2 (two) times daily. 14 tablet 0  . diazepam (VALIUM) 10 MG tablet take 1 tablet by mouth at bedtime if needed for anxiety 30 tablet 0  . feeding supplement, GLUCERNA SHAKE, (GLUCERNA SHAKE) LIQD Take 237 mLs by mouth 2 (two) times daily between meals. 60 Can 2  . finasteride (PROSCAR) 5 MG tablet Take 5 mg by mouth daily.  11  . fish oil-omega-3 fatty acids 1000 MG capsule Take 2 g by mouth 2 (two) times daily.      [name] glucose blood (ONETOUCH VERIO) test strip Use to check blood sugar two times a day.  DX  E11.9 100 each 5  . hydrochlorothiazide (HYDRODIURIL) 25 MG tablet Take 1 tablet (25 mg total) by mouth daily. 30 tablet 5  . losartan (COZAAR) 25 MG tablet Take 1 tablet (25 mg total) by mouth daily. 90 tablet 1  . metFORMIN (GLUCOPHAGE) 500 MG tablet TAKE 2 TABLETS BY MOUTH TWICE DAILY WIT H FOOD 120 tablet 2    . nitroGLYCERIN (NITROSTAT) 0.4 MG SL tablet Place 1 tablet (0.4 mg total) under the tongue every 5 (five) minutes x 3 doses as needed for chest pain (donot take with viagra). 60 tablet 12  . ONE TOUCH LANCETS MISC Use to check blood sugar twice a day.  DX E11.9 100 each 5  . PARoxetine (PAXIL) 30 MG tablet TAKE ONE TABLET BY MOUTH EVERY MORNING 30 tablet 5  . tamsulosin (FLOMAX) 0.4 MG CAPS capsule Take 1 capsule (0.4 mg total) by mouth daily. 30 capsule 5  . HYDROcodone-acetaminophen (NORCO) 7.5-325 MG tablet Take 1 tablet by mouth 3 (three) times daily as needed. (Patient not taking: Reported on 02/20/2015) 90 tablet 0   No current facility-administered medications on file prior to visit.    BP 108/68 mmHg  Pulse 108  Temp(Src) 97.7 F (36.5 C) (Oral)  Ht 6' (1.829 m)  Wt 246 lb 12.8 oz (111.948 kg)  BMI 33.46 kg/m2  SpO2 98%       Objective:   Physical Exam  Constitutional: He is oriented to person, place, and time. He appears well-developed and well-nourished. No distress.  HENT:  Head: Normocephalic and atraumatic.  Cardiovascular: Normal rate and regular rhythm.   No murmur heard. Pulmonary/Chest: Effort normal and breath sounds normal. No respiratory distress. He has no wheezes. He has no rales.  Abdominal: Soft. There is no tenderness. There is no CVA tenderness.  Mild diffuse abdominal tenderness to deep palpation without guarding.   Musculoskeletal: He exhibits no edema.  Neurological: He is alert and oriented to person, place, and time.  Skin: Skin is warm and dry.  Psychiatric: He has a normal mood and affect. His behavior is normal. Thought content normal.          Assessment & Plan:  Addendum:  Spoke with Lowell, pt needs new sleep study before we can re-initiate home CPAP.  Will order sleep study.

## 2015-02-21 NOTE — Assessment & Plan Note (Signed)
Microscopic hematuria. Has been evaluated by urology.

## 2015-02-21 NOTE — Assessment & Plan Note (Signed)
Advised pt to resume his hydrocodone 1/2 tab 3 times daily prn.

## 2015-02-21 NOTE — Assessment & Plan Note (Signed)
Not using CPAP.  I suspect that his untreated OSA is contributing to his fatigue/somnolence.  Will order home CPAP.

## 2015-02-22 ENCOUNTER — Telehealth: Payer: Self-pay | Admitting: *Deleted

## 2015-02-22 NOTE — Telephone Encounter (Signed)
Christian Morris-- Christian Morris did tell pt she would cancel MRI because he said he could not afford it.  THanks!

## 2015-02-22 NOTE — Telephone Encounter (Signed)
Called and canceled appt with Imaging

## 2015-02-23 ENCOUNTER — Ambulatory Visit (HOSPITAL_BASED_OUTPATIENT_CLINIC_OR_DEPARTMENT_OTHER): Payer: PPO

## 2015-03-02 NOTE — Addendum Note (Signed)
Addended by: [name] on: 03/02/2015 11:27 AM   Modules accepted: Miquel Dunn

## 2015-03-19 ENCOUNTER — Telehealth: Payer: Self-pay | Admitting: Family

## 2015-03-19 MED ORDER — HYDROCODONE-ACETAMINOPHEN 7.5-325 MG PO TABS: 1.0000 | ORAL_TABLET | Freq: Three times a day (TID) | ORAL | Status: DC | PRN

## 2015-03-19 NOTE — Telephone Encounter (Signed)
Last Rx 02/11/15. Next UDS due 04/09/15 Next f/u:  05/13/15  Rx printed and forwarded to PCP for signature.

## 2015-03-19 NOTE — Telephone Encounter (Signed)
Caller name: Self  Can be reached: [phone]  Pharmacy:  [facility] [place], [place] - [address] [phone] (Phone) [phone] (Fax)        Reason for call: Refill HYDROcodone-acetaminophen (NORCO) 7.5-325 MG tablet [id number]

## 2015-03-19 NOTE — Telephone Encounter (Signed)
Rx placed at front desk for pick up and notified pt. Pt states "Christian Morris will be proud of me because I haven't taken the pain medication in 2 months".  I reminded pt that we gave him and Rx in February and he states again that he hasn't taken the medication in 2 months. Notified PCP and reviewed chart. Referral was placed to neurology for memory loss on 02/11/15 and note was made that pt declined referral at that time.

## 2015-03-19 NOTE — Telephone Encounter (Signed)
Noted  

## 2015-03-20 MED FILL — HYDROCODON-APAP 7.5-325: 7.5-325 | 30 days supply | Qty: 90 | Fill #0

## 2015-04-01 ENCOUNTER — Other Ambulatory Visit: Payer: Self-pay

## 2015-04-02 NOTE — Patient Outreach (Signed)
Chart review for case closure.

## 2015-04-03 ENCOUNTER — Telehealth: Payer: Self-pay | Admitting: *Deleted

## 2015-04-03 NOTE — Telephone Encounter (Signed)
Received fax from [facility] in [place] requesting to change pt's insulin Rx to pens as he is having great difficulty drawing insulin into syringe.  Gave verbal auth per PCP ok to change.

## 2015-04-10 ENCOUNTER — Telehealth: Payer: Self-pay | Admitting: Family

## 2015-04-10 MED ORDER — HYDROCODONE-ACETAMINOPHEN 7.5-325 MG PO TABS: 1.0000 | ORAL_TABLET | Freq: Three times a day (TID) | ORAL | Status: DC | PRN

## 2015-04-10 NOTE — Telephone Encounter (Signed)
He should not be taking more than 3 tabs per day.  We cannot provide early refills per controlled substance policy.  He will need to provide UDS when he returns.

## 2015-04-10 NOTE — Telephone Encounter (Signed)
Yes please

## 2015-04-10 NOTE — Telephone Encounter (Signed)
Pt requesting refill on hydrocodone. He said he has 3-4 days left. Pt said that he takes it when he has pain. Please call when RX is ready for pick up [phone].

## 2015-04-10 NOTE — Telephone Encounter (Signed)
Rx printed and forwarded to PCP for signature. 

## 2015-04-10 NOTE — Telephone Encounter (Signed)
Spoke with pt and advised him of below. He states he has looked at the date on his bottle wrong. Pt will need to pick up Rx next week. He has been notified of need to provide UDS when he picks up Rx. PCP will be out of the office next week. Please advise if ok to print Rx for fill date of 04/19/15?

## 2015-04-10 NOTE — Telephone Encounter (Signed)
Last Rx 03/19/15, #90.  Next f/u: 05/13/15. Next UDS due 04/09/15. Please advise below request.

## 2015-04-17 LAB — HM DIABETES EYE EXAM

## 2015-04-18 ENCOUNTER — Telehealth: Payer: Self-pay | Admitting: Family

## 2015-04-18 DIAGNOSIS — Z79891 Long term (current) use of opiate analgesic: Secondary | ICD-10-CM | POA: Diagnosis not present

## 2015-04-18 DIAGNOSIS — Z79899 Other long term (current) drug therapy: Secondary | ICD-10-CM | POA: Diagnosis not present

## 2015-04-18 MED ORDER — METFORMIN HCL 500 MG PO TABS: ORAL_TABLET | ORAL | Status: DC

## 2015-04-18 MED ORDER — DIAZEPAM 10 MG PO TABS: ORAL_TABLET | ORAL | Status: DC

## 2015-04-18 MED FILL — HYDROCODON-APAP 7.5-325: 7.5-325 | 30 days supply | Qty: 90 | Fill #0

## 2015-04-18 NOTE — Telephone Encounter (Signed)
Can be reached: [phone] Pharmacy: [facility] [place], [place] [address]  Reason for call: pt called for refill on diazepam. He states he has 3-4 left. He takes 2/morning and 2/night. Pt ok for tonight but will be going to pharmacy tomorrow.

## 2015-04-18 NOTE — Telephone Encounter (Signed)
Spoke with pt and verified that he only takes diazepam as needed at bedtime. Last Rx was 02/08/15, #30. Pt states he has enough to wait until PCP returns on Monday. Rx printed and forwarded to PCP for signature. Pt also requests metformin prescription be sent to [facility] in [place].  Refill sent.

## 2015-04-22 ENCOUNTER — Ambulatory Visit: Payer: PPO | Admitting: Family

## 2015-04-22 NOTE — Telephone Encounter (Signed)
Diazepam refill faxed to below pharmacy.

## 2015-05-02 ENCOUNTER — Ambulatory Visit (INDEPENDENT_AMBULATORY_CARE_PROVIDER_SITE_OTHER): Payer: PPO | Admitting: Family Medicine

## 2015-05-02 ENCOUNTER — Encounter: Payer: Self-pay | Admitting: Family Medicine

## 2015-05-02 VITALS — BP 109/64 | HR 91 | Temp 98.5°F | Ht 72.0 in | Wt 246.0 lb

## 2015-05-02 DIAGNOSIS — J209 Acute bronchitis, unspecified: Secondary | ICD-10-CM

## 2015-05-02 MED ORDER — DOXYCYCLINE HYCLATE 100 MG PO TABS: 100.0000 mg | ORAL_TABLET | Freq: Two times a day (BID) | ORAL | Status: DC

## 2015-05-02 MED FILL — DOXYCYCLINE 100 MG TABLET: 100 | 10 days supply | Qty: 20 | Fill #0

## 2015-05-02 NOTE — Progress Notes (Signed)
[place] at [facility] [address] [place], [place] [address] [phone]  Date:  05/02/2015   Name:  Christian Morris   DOB:  [birth year]   MRN:  [id number]  PCP:  Nance Pear., NP    Chief Complaint: Nasal Congestion   History of Present Illness:  Christian Morris is a 75 y.o. very pleasant male patient who presents with the following:  History of IDDM, BPH, anxiety, HTN.  Here today with illness- he first noted sx about one week ago with malaise, cough, chest congestion.  He also does have sinus congestion. He has not noted any fever but has felt a little feverish subjectively No nausea, vomiting or diarrhea His throat is not sore but it does feel dry No earache but his ears do feel stopped up.  Cough is persistent   BP Readings from Last 3 Encounters:  05/02/15 109/64  02/20/15 108/68  02/13/15 110/70     Patient Active Problem List   Diagnosis Date Noted  . Memory loss 02/11/2015  . Vitamin D deficiency 11/19/2014  . Loss of weight 11/11/2014  . Hematuria 03/12/2014  . Frequent falls 11/14/2013  . Anemia 12/16/2012  . BPH (benign prostatic hyperplasia) 10/28/2012  . Nephrolithiasis 10/28/2012  . Environmental allergies 02/01/2012  . Squamous cell carcinoma in situ of skin of forearm 01/23/2011  . Hypogonadism male 01/11/2011  . Plantar fasciitis 05/19/2010  . ERECTILE DYSFUNCTION, ORGANIC 12/31/2009  . COPD (chronic obstructive pulmonary disease) ([place]) 08/16/2009  . GERD 08/16/2009  . Hepatic steatosis 08/16/2009  . LOW BACK PAIN, CHRONIC 08/16/2009  . MICROALBUMINURIA 08/16/2009  . Diabetes type 2, controlled ([place]) 08/06/2009  . Hyperlipidemia 08/06/2009  . Obesity (BMI 30.0-34.9) 08/06/2009  . ANXIETY DEPRESSION 08/06/2009  . OSA (obstructive sleep apnea) 08/06/2009  . Essential hypertension 08/06/2009  . OSTEOARTHRITIS, KNEES, BILATERAL, MILD 08/06/2009  . PUD, HX OF 08/06/2009  . COLONIC  POLYPS, HX OF 08/06/2009    Past Medical History  Diagnosis Date  . Hypertension   . Hyperlipidemia   . History of gastric ulcer ~ 1958    no recurrence since.  . Hypogonadism male 01/11/2011  . Lung nodule     right 16 mm, seen initially 6/12. 85mm in 08/2011.  [name] GERD (gastroesophageal reflux disease)   . Fatty liver disease, nonalcoholic   . Kidney stones   . Squamous cell cancer of skin of forearm 01/23/2011    left  . Depression   . Type II diabetes mellitus ([place])   . History of blood transfusion ~ 1958    "related to bleeding ulcers"  . Daily headache     "here lately" (02/21/2014)  . Arthritis     "legs" (02/21/2014)  . Chronic lower back pain   . Anxiety   . OSA on CPAP   . Urinary hesitancy   . Vitamin D deficiency 11/19/2014    Past Surgical History  Procedure Laterality Date  . Melanoma excision Left 01/23/2011    forearm  . Left heart catheterization with coronary angiogram N/A 11/17/2012    Procedure: LEFT HEART CATHETERIZATION WITH CORONARY ANGIOGRAM;  Surgeon: Burnell Blanks, MD;  [facility] C[facility] LAB;  Service: Cardiovascular;  Laterality: N/A;  . Cataract extraction w/ intraocular lens implant Left 08/2009    Dr Katy Fitch  . Cardiac catheterization      Social History  Substance Use Topics  . Smoking status: Former Smoker -- 1.00 packs/day for 25  years    Types: Cigarettes    Quit date: 01/05/1982  . Smokeless tobacco: Never Used  . Alcohol Use: Yes     Comment: "quit drinking in the 1980's"    Family History  Problem Relation Age of Onset  . Arthritis Mother   . Heart disease Sister     Massive MI age 45.  . Arrhythmia Brother   . Melanoma Son   . Heart attack Brother     No Known Allergies  Medication list has been reviewed and updated.  Current Outpatient Prescriptions on File Prior to Visit  Medication Sig Dispense Refill  . aspirin EC 81 MG tablet Take 81 mg by mouth every morning.     [name] atorvastatin (LIPITOR) 40 MG tablet Take 1  tablet (40 mg total) by mouth daily. 30 tablet 5  . Cholecalciferol (VITAMIN D3) 3000 units TABS Take 1 tablet by mouth daily. 30 tablet   . diazepam (VALIUM) 10 MG tablet take 1 tablet by mouth at bedtime if needed for anxiety 30 tablet 0  . feeding supplement, GLUCERNA SHAKE, (GLUCERNA SHAKE) LIQD Take 237 mLs by mouth 2 (two) times daily between meals. 60 Can 2  . finasteride (PROSCAR) 5 MG tablet Take 5 mg by mouth daily.  11  . fish oil-omega-3 fatty acids 1000 MG capsule Take 2 g by mouth 2 (two) times daily.      [name] glucose blood (ONETOUCH VERIO) test strip Use to check blood sugar two times a day.  DX  E11.9 100 each 5  . hydrochlorothiazide (HYDRODIURIL) 25 MG tablet Take 1 tablet (25 mg total) by mouth daily. 30 tablet 5  . HYDROcodone-acetaminophen (NORCO) 7.5-325 MG tablet Take 1 tablet by mouth 3 (three) times daily as needed. 90 tablet 0  . Insulin Glargine (LANTUS) 100 UNIT/ML Solostar Pen Inject 5 Units into the skin at bedtime.    . Insulin Pen Needle 32G X 4 MM MISC Use with insulin injection daily.  DX  E11.9 100 each 2  . losartan (COZAAR) 25 MG tablet Take 1 tablet (25 mg total) by mouth daily. 90 tablet 1  . metFORMIN (GLUCOPHAGE) 500 MG tablet TAKE 2 TABLETS BY MOUTH TWICE DAILY WIT H FOOD 120 tablet 2  . nitroGLYCERIN (NITROSTAT) 0.4 MG SL tablet Place 1 tablet (0.4 mg total) under the tongue every 5 (five) minutes x 3 doses as needed for chest pain (donot take with viagra). 60 tablet 12  . ONE TOUCH LANCETS MISC Use to check blood sugar twice a day.  DX E11.9 100 each 5  . PARoxetine (PAXIL) 30 MG tablet TAKE ONE TABLET BY MOUTH EVERY MORNING 30 tablet 5  . tamsulosin (FLOMAX) 0.4 MG CAPS capsule Take 1 capsule (0.4 mg total) by mouth daily. 30 capsule 5   No current facility-administered medications on file prior to visit.    Review of Systems:  As per HPI- otherwise negative.   Physical Examination: Filed Vitals:   05/02/15 0959  BP: 109/64  Pulse: 91  Temp:  98.5 F (36.9 C)   Filed Vitals:   05/02/15 0959  Height: 6' (1.829 m)  Weight: 246 lb (111.585 kg)   Body mass index is 33.36 kg/(m^2). Ideal Body Weight: Weight in (lb) to have BMI = 25: 183.9  GEN: WDWN, NAD, Non-toxic, A & O x 3, obese, looks well HEENT: Atraumatic, Normocephalic. Neck supple. No masses, No LAD.  Bilateral TM wnl, oropharynx normal.  PEERL,EOMI.   Ears and Nose: No external  deformity. CV: RRR, No M/G/R. No JVD. No thrill. No extra heart sounds. PULM: CTA B, no wheezes, crackles, rhonchi. No retractions. No resp. distress. No accessory muscle use. ABD: S, NT, ND, +BS. No rebound. No HSM. EXTR: No c/c/e NEURO Normal gait.  PSYCH: Normally interactive. Conversant. Not depressed or anxious appearing.  Calm demeanor.    Assessment and Plan: Acute bronchitis, unspecified organism - Plan: doxycycline (VIBRA-TABS) 100 MG tablet, DISCONTINUED: doxycycline (VIBRA-TABS) 100 MG tablet  Older gentleman with multiple co-morbidities here today with a week of cough and sinus congestion.  Will treat for bronchitis with doxycycline.  Supportive care discussed, he will let me know if not feeling better in the next few days- Sooner if worse.      Signed Lamar Blinks, MD

## 2015-05-02 NOTE — Progress Notes (Signed)
Pre visit review using our clinic tool,if applicable. No additional management support is needed unless otherwise documented below in the visit note.  

## 2015-05-02 NOTE — Patient Instructions (Signed)
We are going to treat you for bronchitis with doxycycline- take this twice a day for 10 days with food and water.  Let me know if you are not feeling better in the next few days- Sooner if worse.

## 2015-05-13 ENCOUNTER — Ambulatory Visit (INDEPENDENT_AMBULATORY_CARE_PROVIDER_SITE_OTHER): Payer: PPO | Admitting: Family

## 2015-05-13 ENCOUNTER — Encounter: Payer: Self-pay | Admitting: Family

## 2015-05-13 ENCOUNTER — Telehealth: Payer: Self-pay | Admitting: Family

## 2015-05-13 VITALS — BP 129/76 | HR 57 | Temp 97.8°F | Resp 18 | Ht 72.0 in | Wt 242.0 lb

## 2015-05-13 DIAGNOSIS — E118 Type 2 diabetes mellitus with unspecified complications: Secondary | ICD-10-CM

## 2015-05-13 DIAGNOSIS — G4733 Obstructive sleep apnea (adult) (pediatric): Secondary | ICD-10-CM | POA: Diagnosis not present

## 2015-05-13 DIAGNOSIS — Z794 Long term (current) use of insulin: Secondary | ICD-10-CM

## 2015-05-13 DIAGNOSIS — R0789 Other chest pain: Secondary | ICD-10-CM

## 2015-05-13 DIAGNOSIS — E785 Hyperlipidemia, unspecified: Secondary | ICD-10-CM

## 2015-05-13 LAB — BASIC METABOLIC PANEL
BUN: 27 mg/dL — AB (ref 6–23)
CHLORIDE: 96 meq/L (ref 96–112)
CO2: 29 mEq/L (ref 19–32)
Calcium: 10.2 mg/dL (ref 8.4–10.5)
Creatinine, Ser: 0.95 mg/dL (ref 0.40–1.50)
GFR: 81.78 mL/min (ref >60.00)
GLUCOSE: 118 mg/dL — AB (ref 70–99)
POTASSIUM: 3.6 meq/L (ref 3.5–5.1)
SODIUM: 135 meq/L (ref 135–145)

## 2015-05-13 LAB — HEMOGLOBIN A1C: HEMOGLOBIN A1C: 6.6 % — AB (ref 4.6–6.5)

## 2015-05-13 NOTE — Progress Notes (Signed)
Subjective:    Patient ID: Christian Morris, male    DOB: 06-09-1940, 75 y.o.   MRN: [id number]  HPI  [name] is a 75 yr old male who presents today for follow up.  1) HTN- current BP meds include hctz, losartan. BP Readings from Last 3 Encounters:  05/13/15 129/76  05/02/15 109/64  02/20/15 108/68   2) Hyperlipidemia- on lipitor 40mg . Denies myalgia.  Lab Results  Component Value Date   CHOL 130 02/11/2015   HDL 37.20* 02/11/2015   LDLCALC 66 08/17/2014   LDLDIRECT 59.0 02/11/2015   TRIG 213.0* 02/11/2015   CHOLHDL 3 02/11/2015   3) DM2- maintained on metformin, lantus 5 units at bedtime. Reports that he is compliant with metformin, only sometimes taking insulin on occasion.  Not checking sugars. Lab Results  Component Value Date   HGBA1C 6.3 02/11/2015   HGBA1C 6.5 08/17/2014   HGBA1C 6.5* 02/22/2014   Lab Results  Component Value Date   MICROALBUR 11.4* 08/17/2014   LDLCALC 66 08/17/2014   CREATININE 0.99 02/12/2015   4) OSA- last visit we ordered home CPAP for the patient. Reports he was never contacted.    Reports Chest pain 1 x a week, briefly can occur at rest. Resolves on own. Had neg stress test in 2014 for same and cardiology felt that his symptoms were GI in nature.   Review of Systems  Respiratory: Negative for shortness of breath.       see HPI  Past Medical History  Diagnosis Date  . Hypertension   . Hyperlipidemia   . History of gastric ulcer ~ 1958    no recurrence since.  . Hypogonadism male 01/11/2011  . Lung nodule     right 16 mm, seen initially 6/12. 13mm in 08/2011.  [name]. GERD (gastroesophageal reflux disease)   . Fatty liver disease, nonalcoholic   . Kidney stones   . Squamous cell cancer of skin of forearm 01/23/2011    left  . Depression   . Type II diabetes mellitus ([facility])   . History of blood transfusion ~ 1958    "related to bleeding ulcers"  . Daily headache     "here lately" (02/21/2014)  . Arthritis     "legs" (02/21/2014)  .  Chronic lower back pain   . Anxiety   . OSA on CPAP   . Urinary hesitancy   . Vitamin D deficiency 11/19/2014     Social History   Social History  . Marital Status: Divorced    Spouse Name: N/A  . Number of Children: 3  . Years of Education: N/A   Occupational History  . RETIRED BUS DRIVER    Social History Main Topics  . Smoking status: Former Smoker -- 1.00 packs/day for 25 years    Types: Cigarettes    Quit date: 01/05/1982  . Smokeless tobacco: Never Used  . Alcohol Use: Yes     Comment: "quit drinking in the 1980's"  . Drug Use: No  . Sexual Activity: Yes   Other Topics Concern  . 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    Past Surgical History  Procedure Laterality Date  . Melanoma excision Left 01/23/2011    forearm  . Left heart catheterization with coronary angiogram N/A 11/17/2012    Procedure: LEFT HEART CATHETERIZATION WITH CORONARY ANGIOGRAM;  Surgeon: Kathleene Hazelhristopher D McAlhany, MD;  Location:  [facility] CATH [facility];  Service: Cardiovascular;  Laterality: N/A;  . Cataract extraction w/ intraocular lens implant Left 08/2009    Dr Katy Fitch  . Cardiac catheterization      Family History  Problem Relation Age of Onset  . Arthritis Mother   . Heart disease Sister     Massive MI age 26.  . Arrhythmia Brother   . Melanoma Son   . Heart attack Brother     No Known Allergies  Current Outpatient Prescriptions on File Prior to Visit  Medication Sig Dispense Refill  . aspirin EC 81 MG tablet Take 81 mg by mouth every morning.     [name] atorvastatin (LIPITOR) 40 MG tablet Take 1 tablet (40 mg total) by mouth daily. 30 tablet 5  . Cholecalciferol (VITAMIN D3) 3000 units TABS Take 1 tablet by mouth daily. 30 tablet   . diazepam (VALIUM) 10 MG tablet take 1 tablet by mouth at bedtime if needed for anxiety 30 tablet 0  . finasteride (PROSCAR) 5 MG tablet Take 5 mg by mouth daily.  11  . fish  oil-omega-3 fatty acids 1000 MG capsule Take 2 g by mouth 2 (two) times daily.      [name] glucose blood (ONETOUCH VERIO) test strip Use to check blood sugar two times a day.  DX  E11.9 100 each 5  . hydrochlorothiazide (HYDRODIURIL) 25 MG tablet Take 1 tablet (25 mg total) by mouth daily. 30 tablet 5  . HYDROcodone-acetaminophen (NORCO) 7.5-325 MG tablet Take 1 tablet by mouth 3 (three) times daily as needed. 90 tablet 0  . Insulin Glargine (LANTUS) 100 UNIT/ML Solostar Pen Inject 5 Units into the skin at bedtime.    . Insulin Pen Needle 32G X 4 MM MISC Use with insulin injection daily.  DX  E11.9 100 each 2  . losartan (COZAAR) 25 MG tablet Take 1 tablet (25 mg total) by mouth daily. 90 tablet 1  . metFORMIN (GLUCOPHAGE) 500 MG tablet TAKE 2 TABLETS BY MOUTH TWICE DAILY WIT H FOOD 120 tablet 2  . nitroGLYCERIN (NITROSTAT) 0.4 MG SL tablet Place 1 tablet (0.4 mg total) under the tongue every 5 (five) minutes x 3 doses as needed for chest pain (donot take with viagra). 60 tablet 12  . ONE TOUCH LANCETS MISC Use to check blood sugar twice a day.  DX E11.9 100 each 5  . PARoxetine (PAXIL) 30 MG tablet TAKE ONE TABLET BY MOUTH EVERY MORNING 30 tablet 5  . tamsulosin (FLOMAX) 0.4 MG CAPS capsule Take 1 capsule (0.4 mg total) by mouth daily. 30 capsule 5   No current facility-administered medications on file prior to visit.    BP 129/76 mmHg  Pulse 57  Temp(Src) 97.8 F (36.6 C) (Oral)  Resp 18  Ht 6' (1.829 m)  Wt 242 lb (109.77 kg)  BMI 32.81 kg/m2  SpO2 99%    Objective:   Physical Exam  Constitutional: He is oriented to person, place, and time. He appears well-developed and well-nourished. No distress.  HENT:  Head: Normocephalic and atraumatic.  Cardiovascular: Normal rate and regular rhythm.   No murmur heard. Pulmonary/Chest: Effort normal and breath sounds normal. No respiratory distress. He has no wheezes. He has no rales.  Musculoskeletal: He exhibits no edema.  Neurological: He  is alert and oriented to person, place, and time.  Skin: Skin is warm and dry.  Psychiatric: He has a normal mood and affect. His behavior is normal. Thought content normal.  Assessment & Plan:  Atypical CP- will arrange follow up with cardiology since he has not seen them since 2014. EKG is performed today and personally reviewed.  Note is made of Left Anterior Fascicular block.  HR 64.

## 2015-05-13 NOTE — Assessment & Plan Note (Signed)
Tolerating statin. LDL at goal. Continue same.

## 2015-05-13 NOTE — Assessment & Plan Note (Signed)
Not using lantus. Will see how A1C looks. Continue metformin.

## 2015-05-13 NOTE — Assessment & Plan Note (Signed)
Will check status of CPAP delivery.

## 2015-05-13 NOTE — Telephone Encounter (Signed)
Note sent to [facility] to check on CPAP order

## 2015-05-13 NOTE — Progress Notes (Signed)
Pre visit review using our clinic review tool, if applicable. No additional management support is needed unless otherwise documented below in the visit note. 

## 2015-05-13 NOTE — Telephone Encounter (Signed)
Could you please check status of pt's CPAP delivery.  He states he was never contacted.

## 2015-05-13 NOTE — Patient Instructions (Addendum)
Please complete lab work prior to leaving. Go to the ER if recurrent chest pain occurs.  You will be contacted about setting up your cpap machine, please let us know if you have not heard back about this in 1 week.  You will also be contacted about referral to cardiology for follow up of your chest pain.

## 2015-05-14 ENCOUNTER — Encounter: Payer: Self-pay | Admitting: Family

## 2015-05-14 ENCOUNTER — Other Ambulatory Visit: Payer: Self-pay | Admitting: Family

## 2015-05-15 ENCOUNTER — Other Ambulatory Visit: Payer: PPO

## 2015-05-15 ENCOUNTER — Telehealth: Payer: Self-pay | Admitting: Family

## 2015-05-15 DIAGNOSIS — Z79891 Long term (current) use of opiate analgesic: Secondary | ICD-10-CM | POA: Diagnosis not present

## 2015-05-15 DIAGNOSIS — G4733 Obstructive sleep apnea (adult) (pediatric): Secondary | ICD-10-CM

## 2015-05-15 DIAGNOSIS — Z79899 Other long term (current) drug therapy: Secondary | ICD-10-CM | POA: Diagnosis not present

## 2015-05-15 MED ORDER — HYDROCODONE-ACETAMINOPHEN 7.5-325 MG PO TABS: 1.0000 | ORAL_TABLET | Freq: Three times a day (TID) | ORAL | Status: DC | PRN

## 2015-05-15 MED FILL — HYDROCODON-APAP 7.5-325: 7.5-325 | 30 days supply | Qty: 90 | Fill #0

## 2015-05-15 NOTE — Telephone Encounter (Signed)
Pt is requesting a refill on his HYDROcodone Rx.    CB: [phone]

## 2015-05-15 NOTE — Telephone Encounter (Signed)
Also need to notify pt of labs when we call him about Rx.

## 2015-05-15 NOTE — Telephone Encounter (Signed)
Rx was placed at the front desk for pick up this morning. Notified pt and he wanted me to let PCP know that he is going to see Dr Harl Bowie (cardiologist in [place]) on Friday at 1:30pm.

## 2015-05-15 NOTE — Telephone Encounter (Signed)
See order(s).

## 2015-05-15 NOTE — Telephone Encounter (Signed)
Received note back that they needs a new sleep study. Will check status.

## 2015-05-15 NOTE — Telephone Encounter (Signed)
Last RX:  Printed 4/5 to be filled on 04/19/15, #90 UDS:  Last 01/2015 and due now. Will collect when he picks up rx. Next OV: 08/14/15.  Rx printed and forwarded to PCP for signature.

## 2015-05-17 ENCOUNTER — Encounter: Payer: Self-pay | Admitting: Cardiology

## 2015-05-17 ENCOUNTER — Ambulatory Visit (INDEPENDENT_AMBULATORY_CARE_PROVIDER_SITE_OTHER): Payer: PPO | Admitting: Cardiology

## 2015-05-17 VITALS — BP 114/72 | HR 102 | Ht 72.0 in | Wt 243.0 lb

## 2015-05-17 DIAGNOSIS — I10 Essential (primary) hypertension: Secondary | ICD-10-CM

## 2015-05-17 DIAGNOSIS — R0789 Other chest pain: Secondary | ICD-10-CM | POA: Diagnosis not present

## 2015-05-17 NOTE — Telephone Encounter (Signed)
Pt notified that a new sleep study is needed.  He stated understanding, but says he may not be able to afford it.

## 2015-05-17 NOTE — Patient Instructions (Signed)
Your physician wants you to follow-up in: 6 Months with Dr. Branch. You will receive a reminder letter in the mail two months in advance. If you don't receive a letter, please call our office to schedule the follow-up appointment.  Your physician recommends that you continue on your current medications as directed. Please refer to the Current Medication list given to you today.  If you need a refill on your cardiac medications before your next appointment, please call your pharmacy.  Thank you for choosing [facility]!   

## 2015-05-17 NOTE — Progress Notes (Signed)
Patient ID: Christian Morris, male   DOB: 10/07/1940, 75 y.o.   MRN: [id number]     Clinical Summary Christian Morris is a 75 y.o.male seen today as a new patient, he is referred by Christian Morris for chest pain.  1. Chest pain - long history of atypical chest pain, he has had previous evaluations in the past without significant evidence of CAD - most recent 02/2014 nuclear stress without clear ischemia.  - 12/2012 echo LVEF 60-65%, no WMAs. - 11/2012 cath mild nonobstructive CAD  - nonspecific pain left chest, can travel down into left abdomen. 5/10 in severity. Can occur at rest or with exertion. No other associated symptoms. Not positional. Lasts approx 5 minutes. Occurs approx once every 3 months. Similar to pain he has had over the last several years. - denies any SOB or DOE. Walks regularly without significant troubles, though limited by chronic knee pain.    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  Past Medical History  Diagnosis Date  . Hypertension   . Hyperlipidemia   . History of gastric ulcer ~ 1958    no recurrence since.  . Hypogonadism male 01/11/2011  . Lung nodule     right 16 mm, seen initially 6/12. 5mm in 08/2011.  [name] GERD (gastroesophageal reflux disease)   . Fatty liver disease, nonalcoholic   . Kidney stones   . Squamous cell cancer of skin of forearm 01/23/2011    left  . Depression   . Type II diabetes mellitus ([place])   . History of blood transfusion ~ 1958    "related to bleeding ulcers"  . Daily headache     "here lately" (02/21/2014)  . Arthritis     "legs" (02/21/2014)  . Chronic lower back pain   . Anxiety   . OSA on CPAP   . Urinary hesitancy   . Vitamin D deficiency 11/19/2014     No Known Allergies   Current Outpatient Prescriptions  Medication Sig Dispense Refill  . aspirin EC 81 MG tablet Take 81 mg by mouth every morning.     [name] atorvastatin (LIPITOR) 40 MG tablet Take 1 tablet (40 mg total) by mouth daily. 30 tablet 5  .  Cholecalciferol (VITAMIN D3) 3000 units TABS Take 1 tablet by mouth daily. 30 tablet   . diazepam (VALIUM) 10 MG tablet take 1 tablet by mouth at bedtime if needed for anxiety 30 tablet 0  . finasteride (PROSCAR) 5 MG tablet Take 5 mg by mouth daily.  11  . fish oil-omega-3 fatty acids 1000 MG capsule Take 2 g by mouth 2 (two) times daily.      [name] glucose blood (ONETOUCH VERIO) test strip Use to check blood sugar two times a day.  DX  E11.9 100 each 5  . hydrochlorothiazide (HYDRODIURIL) 25 MG tablet Take 1 tablet (25 mg total) by mouth daily. 30 tablet 5  . HYDROcodone-acetaminophen (NORCO) 7.5-325 MG tablet Take 1 tablet by mouth 3 (three) times daily as needed. 90 tablet 0  . losartan (COZAAR) 25 MG tablet Take 1 tablet (25 mg total) by mouth daily. 90 tablet 1  . metFORMIN (GLUCOPHAGE) 500 MG tablet TAKE 2 TABLETS BY MOUTH TWICE DAILY WIT H FOOD 120 tablet 2  . nitroGLYCERIN (NITROSTAT) 0.4 MG SL tablet Place 1 tablet (0.4 mg total) under the tongue every 5 (five) minutes x 3 doses as needed for chest pain (donot take with viagra). 60 tablet 12  . ONE TOUCH  LANCETS MISC Use to check blood sugar twice a day.  DX E11.9 100 each 5  . PARoxetine (PAXIL) 30 MG tablet TAKE ONE TABLET BY MOUTH EVERY MORNING 30 tablet 5  . tamsulosin (FLOMAX) 0.4 MG CAPS capsule Take 1 capsule (0.4 mg total) by mouth daily. 30 capsule 5   No current facility-administered medications for this visit.     Past Surgical History  Procedure Laterality Date  . Melanoma excision Left 01/23/2011    forearm  . Left heart catheterization with coronary angiogram N/A 11/17/2012    Procedure: LEFT HEART CATHETERIZATION WITH CORONARY ANGIOGRAM;  Surgeon: Christian Blanks, MD;  Location: [facility] CATH LAB;  Service: Cardiovascular;  Laterality: N/A;  . Cataract extraction w/ intraocular lens implant Left 08/2009    Dr Christian Morris  . Cardiac catheterization       No Known Allergies    Family History  Problem Relation Age of  Onset  . Arthritis Mother   . Heart disease Sister     Massive MI age 84.  . Arrhythmia Brother   . Melanoma Son   . Heart attack Brother      Social History Christian Morris reports that he quit smoking about 33 years ago. His smoking use included Cigarettes. He has a 25 pack-year smoking history. He has never used smokeless tobacco. Christian Morris reports that he drinks alcohol.   Review of Systems CONSTITUTIONAL: No weight loss, fever, chills, weakness or fatigue.  HEENT: Eyes: No visual loss, blurred vision, double vision or yellow sclerae.No hearing loss, sneezing, congestion, runny nose or sore throat.  SKIN: No rash or itching.  CARDIOVASCULAR: per HPI RESPIRATORY: No shortness of breath, cough or sputum.  GASTROINTESTINAL: No anorexia, nausea, vomiting or diarrhea. No abdominal pain or blood.  GENITOURINARY: No burning on urination, no polyuria NEUROLOGICAL: No headache, dizziness, syncope, paralysis, ataxia, numbness or tingling in the extremities. No change in bowel or bladder control.  MUSCULOSKELETAL: No muscle, back pain, joint pain or stiffness.  LYMPHATICS: No enlarged nodes. No history of splenectomy.  PSYCHIATRIC: No history of depression or anxiety.  ENDOCRINOLOGIC: No reports of sweating, cold or heat intolerance. No polyuria or polydipsia.  [name]   Physical Examination Filed Vitals:   05/17/15 1322  BP: 114/72  Pulse: 102   Filed Vitals:   05/17/15 1322  Height: 6' (1.829 m)  Weight: 243 lb (110.224 kg)    Gen: resting comfortably, no acute distress HEENT: no scleral icterus, pupils equal round and reactive, no palptable cervical adenopathy,  CV: RRR, no m/r/g, no jvd Resp: Clear to auscultation bilaterally GI: abdomen is soft, non-tender, non-distended, normal bowel sounds, no hepatosplenomegaly MSK: extremities are warm, no edema.  Skin: warm, no rash Neuro:  no focal deficits Psych: appropriate affect      Assessment and Plan   1. Chest pain - long  history of atypical chest pain with negative evaluations in the past with previous stress tests and cath - EKG in clinic without ischemic changes - symptoms unchanged over the last several years, no indication for repeat testing at this time - continue risk factor modificaiton.  2. HTN - bp at goal, continue current meds F/u 6 months     Arnoldo Lenis, M.D.

## 2015-05-17 NOTE — Telephone Encounter (Signed)
Patient calling checking on the status of message below.

## 2015-06-06 ENCOUNTER — Telehealth: Payer: Self-pay | Admitting: Family

## 2015-06-06 DIAGNOSIS — E119 Type 2 diabetes mellitus without complications: Secondary | ICD-10-CM

## 2015-06-06 NOTE — Telephone Encounter (Signed)
Melissa, please advise on eye doctor that you recommend. I can place the referral.

## 2015-06-06 NOTE — Telephone Encounter (Signed)
Caller name:Self  Can be reached: [phone]     Reason for call: Request a referral to see an Eye Doctor. Patient has not selected one and request that PCP tell him who he should see

## 2015-06-07 ENCOUNTER — Telehealth: Payer: Self-pay | Admitting: Family

## 2015-06-07 NOTE — Telephone Encounter (Signed)
Spoke with pt and provided him with Dr Zelphia Cairo office # to call and arrange appt as original referral was placed in February and he was scheduled to see them on 02/25/15. Pt voices understanding and will call them for appt.

## 2015-06-07 NOTE — Telephone Encounter (Signed)
Relation to [id number] Call back number:[phone] Pharmacy:  Reason for call:  Patient checking on the status of ophthalmologist referral. Patient states PCP recommended him seeing a specialist. Please advise

## 2015-06-07 NOTE — Telephone Encounter (Signed)
Please see 06/07/15 phone note. Not sure if below # is correct. Unable to reach pt at below # and went to voicemail that has not been set up.

## 2015-06-07 NOTE — Addendum Note (Signed)
Addended by: [name] on: 06/07/2015 11:38 AM   Modules accepted: Orders

## 2015-06-10 ENCOUNTER — Telehealth: Payer: Self-pay | Admitting: Family

## 2015-06-10 DIAGNOSIS — H524 Presbyopia: Secondary | ICD-10-CM | POA: Diagnosis not present

## 2015-06-10 DIAGNOSIS — E119 Type 2 diabetes mellitus without complications: Secondary | ICD-10-CM | POA: Diagnosis not present

## 2015-06-10 DIAGNOSIS — H25811 Combined forms of age-related cataract, right eye: Secondary | ICD-10-CM | POA: Diagnosis not present

## 2015-06-10 DIAGNOSIS — H5203 Hypermetropia, bilateral: Secondary | ICD-10-CM | POA: Diagnosis not present

## 2015-06-10 NOTE — Telephone Encounter (Signed)
Relation to [id number] Call back [phone] Pharmacy:  Reason for call:  Patient requesting a refill HYDROcodone-acetaminophen (NORCO) 7.5-325 MG tablet

## 2015-06-11 ENCOUNTER — Telehealth: Payer: Self-pay | Admitting: Family

## 2015-06-11 MED ORDER — HYDROCODONE-ACETAMINOPHEN 7.5-325 MG PO TABS: 1.0000 | ORAL_TABLET | Freq: Three times a day (TID) | ORAL | Status: DC | PRN

## 2015-06-11 NOTE — Telephone Encounter (Signed)
Spoke with pt. He states he went to the ophthalmologist and was told he only needs otc reading glasses. Dr Zelphia Cairo office. I will call them tomorrow to request record [id number]).

## 2015-06-11 NOTE — Telephone Encounter (Signed)
Pt called in because he says that he was speaking with CMA. Pt is requesting a call back.    CB: [phone]

## 2015-06-11 NOTE — Telephone Encounter (Signed)
Last Rx:  05/15/15, #90 Next OV: 08/2015 UDS: pt should have completed in May. Sent message to Amy to get result.  Rx printed and forwarded to PCP for signature with note to fill on 06/13/15.

## 2015-06-11 NOTE — Telephone Encounter (Signed)
Per message from Amy, Pt provided UDS in April and May and she will bring both copies to [place] today. Rx placed at front desk for pick up and pt notified.

## 2015-06-12 NOTE — Telephone Encounter (Signed)
Dr Phineas Real no longer at below #. Currently with [facility]. Called [phone] and requested recent eye exam from 06/10/15. Awaiting report.

## 2015-06-13 MED FILL — HYDROCODON-APAP 7.5-325: 7.5-325 | 30 days supply | Qty: 90 | Fill #0

## 2015-06-14 NOTE — Telephone Encounter (Signed)
Spoke with Abigail Butts at [facility] and requested below note. She states notes was faxed on 06/12/15. She will fax again to my attention. Awaiting report.

## 2015-06-17 NOTE — Telephone Encounter (Signed)
Jessica-- have you seen this come through yet? 

## 2015-06-17 NOTE — Telephone Encounter (Signed)
Note received and placed in Provider's yellow folder for review.

## 2015-06-17 NOTE — Telephone Encounter (Signed)
Records received. Forwarded to Melissa/Tricia. JG//CMA

## 2015-06-18 ENCOUNTER — Telehealth: Payer: Self-pay | Admitting: Family

## 2015-06-18 NOTE — Telephone Encounter (Signed)
°  Relationship to patient: Self  Can be reached: [phone]   Reason for call: Patient called to inform provider that he did go to the eye doctor.

## 2015-06-19 NOTE — Telephone Encounter (Signed)
Eye exam has already been documented and note forwarded to PCP. See 06/11/15 phone note.

## 2015-07-01 ENCOUNTER — Encounter: Payer: Self-pay | Admitting: Family

## 2015-07-04 ENCOUNTER — Encounter: Payer: Self-pay | Admitting: Family

## 2015-07-12 ENCOUNTER — Telehealth: Payer: Self-pay | Admitting: Family

## 2015-07-12 MED ORDER — HYDROCODONE-ACETAMINOPHEN 7.5-325 MG PO TABS: 1.0000 | ORAL_TABLET | Freq: Three times a day (TID) | ORAL | Status: DC | PRN

## 2015-07-12 MED FILL — HYDROCODON-APAP 7.5-325: 7.5-325 | 30 days supply | Qty: 90 | Fill #0

## 2015-07-12 NOTE — Telephone Encounter (Signed)
See rx. 

## 2015-07-12 NOTE — Telephone Encounter (Signed)
Pt called back in to follow up on medication request. Pt says that he is out.  Informed pt that we are currently awaiting PCP's response and will call him once Rx is ready for pick up

## 2015-07-12 NOTE — Telephone Encounter (Signed)
Pt is requesting refill on Hydrocodone.  Last OV: 05/13/2015 Last Fill: 06/11/2015 #90 and 0RF UDS: 01/09/2015 Moderate risk  Please advise.

## 2015-07-12 NOTE — Telephone Encounter (Signed)
Rx placed at front desk and notified pt.

## 2015-07-12 NOTE — Telephone Encounter (Signed)
Relation to [id number] Call back number:[phone] Pharmacy:  Reason for call:  Patient requesting a refill HYDROcodone-acetaminophen (NORCO) 7.5-325 MG tablet

## 2015-07-14 ENCOUNTER — Other Ambulatory Visit: Payer: Self-pay | Admitting: Family

## 2015-07-25 ENCOUNTER — Other Ambulatory Visit: Payer: Self-pay | Admitting: Family

## 2015-08-13 ENCOUNTER — Other Ambulatory Visit: Payer: Self-pay | Admitting: Family

## 2015-08-14 ENCOUNTER — Telehealth: Payer: Self-pay | Admitting: Family

## 2015-08-14 ENCOUNTER — Encounter: Payer: Self-pay | Admitting: Family

## 2015-08-14 ENCOUNTER — Ambulatory Visit (INDEPENDENT_AMBULATORY_CARE_PROVIDER_SITE_OTHER): Payer: PPO | Admitting: Family

## 2015-08-14 VITALS — BP 100/65 | HR 59 | Temp 98.1°F | Resp 18 | Ht 72.0 in | Wt 250.0 lb

## 2015-08-14 DIAGNOSIS — G8929 Other chronic pain: Secondary | ICD-10-CM

## 2015-08-14 DIAGNOSIS — I10 Essential (primary) hypertension: Secondary | ICD-10-CM

## 2015-08-14 DIAGNOSIS — E785 Hyperlipidemia, unspecified: Secondary | ICD-10-CM

## 2015-08-14 DIAGNOSIS — E118 Type 2 diabetes mellitus with unspecified complications: Secondary | ICD-10-CM

## 2015-08-14 DIAGNOSIS — Z79891 Long term (current) use of opiate analgesic: Secondary | ICD-10-CM | POA: Diagnosis not present

## 2015-08-14 DIAGNOSIS — Z794 Long term (current) use of insulin: Secondary | ICD-10-CM

## 2015-08-14 DIAGNOSIS — N400 Enlarged prostate without lower urinary tract symptoms: Secondary | ICD-10-CM

## 2015-08-14 DIAGNOSIS — R300 Dysuria: Secondary | ICD-10-CM | POA: Diagnosis not present

## 2015-08-14 DIAGNOSIS — G4733 Obstructive sleep apnea (adult) (pediatric): Secondary | ICD-10-CM

## 2015-08-14 DIAGNOSIS — Z79899 Other long term (current) drug therapy: Secondary | ICD-10-CM | POA: Diagnosis not present

## 2015-08-14 DIAGNOSIS — M545 Low back pain: Secondary | ICD-10-CM

## 2015-08-14 LAB — LIPID PANEL
Cholesterol: 134 mg/dL (ref 0–200)
HDL: 43.7 mg/dL (ref >39.00)
NONHDL: 90.76
Total CHOL/HDL Ratio: 3
Triglycerides: 208 mg/dL — ABNORMAL HIGH (ref 0.0–149.0)
VLDL: 41.6 mg/dL — ABNORMAL HIGH (ref 0.0–40.0)

## 2015-08-14 LAB — BASIC METABOLIC PANEL
BUN: 22 mg/dL (ref 6–23)
CALCIUM: 10 mg/dL (ref 8.4–10.5)
CO2: 28 meq/L (ref 19–32)
CREATININE: 1.12 mg/dL (ref 0.40–1.50)
Chloride: 102 mEq/L (ref 96–112)
GFR: 67.59 mL/min (ref >60.00)
GLUCOSE: 158 mg/dL — AB (ref 70–99)
Potassium: 4 mEq/L (ref 3.5–5.1)
Sodium: 139 mEq/L (ref 135–145)

## 2015-08-14 LAB — POCT URINALYSIS DIPSTICK
BILIRUBIN UA: NEGATIVE
GLUCOSE UA: NEGATIVE
Ketones, UA: NEGATIVE
Leukocytes, UA: NEGATIVE
Nitrite, UA: NEGATIVE
UROBILINOGEN UA: NEGATIVE
pH, UA: 5

## 2015-08-14 LAB — HEMOGLOBIN A1C: HEMOGLOBIN A1C: 6.5 % (ref 4.6–6.5)

## 2015-08-14 LAB — LDL CHOLESTEROL, DIRECT: Direct LDL: 66 mg/dL

## 2015-08-14 MED ORDER — HYDROCODONE-ACETAMINOPHEN 7.5-325 MG PO TABS: 1.0000 | ORAL_TABLET | Freq: Three times a day (TID) | ORAL | 0 refills | Status: DC | PRN

## 2015-08-14 MED FILL — HYDROCODON-APAP 7.5-325: 7.5-325 | 30 days supply | Qty: 90 | Fill #0

## 2015-08-14 NOTE — Telephone Encounter (Signed)
I have contacted the patient, he wants to go to [facility] for study. Awaiting return call from [facility]

## 2015-08-14 NOTE — Progress Notes (Signed)
Pre visit review using our clinic review tool, if applicable. No additional management support is needed unless otherwise documented below in the visit note. 

## 2015-08-14 NOTE — Progress Notes (Signed)
Subjective:    Patient ID: Christian Morris, male    DOB: Oct 31, 1940, 75 y.o.   MRN: [id number]  HPI  Christian Morris is a 75 yr old male who presents today for follow up.  1) DM2- he is mainted on metformin.  Lab Results  Component Value Date   HGBA1C 6.6 (H) 05/13/2015   HGBA1C 6.3 02/11/2015   HGBA1C 6.5 08/17/2014   Lab Results  Component Value Date   MICROALBUR 11.4 (H) 08/17/2014   LDLCALC 66 08/17/2014   CREATININE 0.95 05/13/2015   2) HTN- maintained on hctz and losartan.  BP Readings from Last 3 Encounters:  08/14/15 100/65  05/17/15 114/72  05/13/15 129/76   3) OSA-  Not wearing the mask.  States he was not contacted re: sleep study.    4) Dysuria- notes intermittent dysuria. Slow stream. Maintained on flomax and proscar.   Review of Systems See HPI  Past Medical History:  Diagnosis Date  . Anxiety   . Arthritis    "legs" (02/21/2014)  . Chronic lower back pain   . Daily headache    "here lately" (02/21/2014)  . Depression   . Fatty liver disease, nonalcoholic   . GERD (gastroesophageal reflux disease)   . History of blood transfusion ~ 1958   "related to bleeding ulcers"  . History of gastric ulcer ~ 1958   no recurrence since.  . Hyperlipidemia   . Hypertension   . Hypogonadism male 01/11/2011  . Kidney stones   . Lung nodule    right 16 mm, seen initially 6/12. 79mm in 08/2011.  . OSA on CPAP   . Squamous cell cancer of skin of forearm 01/23/2011   left  . Type II diabetes mellitus ([place])   . Urinary hesitancy   . Vitamin D deficiency 11/19/2014     Social History   Social History  . Marital status: Divorced    Spouse name: N/A  . Number of children: 3  . Years of education: N/A   Occupational History  . RETIRED BUS DRIVER Retired   Social History Main Topics  . Smoking status: Former Smoker    Packs/day: 1.00    Years: 25.00    Types: Cigarettes    Quit date: 01/05/1982  . Smokeless tobacco: Never Used  . Alcohol use 0.0 oz/week   Comment: "quit drinking in the 1980's"  . Drug use: No  . Sexual activity: Yes   Other Topics Concern  . 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    Past Surgical History:  Procedure Laterality Date  . CARDIAC CATHETERIZATION    . CATARACT EXTRACTION W/ INTRAOCULAR LENS IMPLANT Left 08/2009   Dr Katy Fitch  . LEFT HEART CATHETERIZATION WITH CORONARY ANGIOGRAM N/A 11/17/2012   Procedure: LEFT HEART CATHETERIZATION WITH CORONARY ANGIOGRAM;  Surgeon: Burnell Blanks, MD;  Location: [facility] CATH LAB;  Service: Cardiovascular;  Laterality: N/A;  . MELANOMA EXCISION Left 01/23/2011   forearm    Family History  Problem Relation Age of Onset  . Arthritis Mother   . Heart disease Sister     Massive MI age 44.  . Arrhythmia Brother   . Melanoma Son   . Heart attack Brother     No Known Allergies  Current Outpatient Prescriptions on File Prior to Visit  Medication Sig Dispense Refill  . aspirin EC 81 MG tablet Take 81  mg by mouth every morning.     [name] atorvastatin (LIPITOR) 40 MG tablet TAKE ONE TABLET BY MOUTH ONCE DAILY 30 tablet 5  . diazepam (VALIUM) 10 MG tablet take 1 tablet by mouth at bedtime if needed for anxiety 30 tablet 0  . finasteride (PROSCAR) 5 MG tablet Take 5 mg by mouth daily.  11  . fish oil-omega-3 fatty acids 1000 MG capsule Take 2 g by mouth 2 (two) times daily.      [name] glucose blood (ONETOUCH VERIO) test strip Use to check blood sugar two times a day.  DX  E11.9 100 each 5  . hydrochlorothiazide (HYDRODIURIL) 25 MG tablet TAKE ONE TABLET BY MOUTH ONCE DAILY 30 tablet 5  . HYDROcodone-acetaminophen (NORCO) 7.5-325 MG tablet Take 1 tablet by mouth 3 (three) times daily as needed. 90 tablet 0  . losartan (COZAAR) 25 MG tablet TAKE ONE TABLET BY MOUTH ONCE DAILY 90 tablet 0  . metFORMIN (GLUCOPHAGE) 500 MG tablet TAKE TWO TABLETS BY MOUTH TWICE DAILY WITH FOOD 120 tablet 5  .  nitroGLYCERIN (NITROSTAT) 0.4 MG SL tablet Place 1 tablet (0.4 mg total) under the tongue every 5 (five) minutes x 3 doses as needed for chest pain (donot take with viagra). 60 tablet 12  . ONE TOUCH LANCETS MISC Use to check blood sugar twice a day.  DX E11.9 100 each 5  . PARoxetine (PAXIL) 30 MG tablet TAKE ONE TABLET BY MOUTH ONCE DAILY IN THE MORNING 30 tablet 5  . tamsulosin (FLOMAX) 0.4 MG CAPS capsule TAKE ONE CAPSULE BY MOUTH ONCE DAILY 30 capsule 5   No current facility-administered medications on file prior to visit.     BP 100/65   Pulse (!) 59   Temp 98.1 F (36.7 C) (Oral)   Resp 18   Ht 6' (1.829 m)   Wt 250 lb (113.4 kg)   SpO2 97% Comment: room air  BMI 33.91 kg/m        Objective:   Physical Exam  Constitutional: He is oriented to person, place, and time. He appears well-developed and well-nourished. No distress.  HENT:  Head: Normocephalic and atraumatic.  Cardiovascular: Normal rate and regular rhythm.   No murmur heard. Pulmonary/Chest: Effort normal and breath sounds normal. No respiratory distress. He has no wheezes. He has no rales.  Musculoskeletal: He exhibits no edema.  Neurological: He is alert and oriented to person, place, and time.  Skin: Skin is warm and dry. No erythema.  Psychiatric: He has a normal mood and affect. His behavior is normal. Thought content normal.          Assessment & Plan:  Reports that he ran out of pain medications 2 days ago.

## 2015-08-14 NOTE — Telephone Encounter (Signed)
Could you please check status of sleep study. Pt states he was not contacted and he would like to complete. I ordered back in May. Thanks.

## 2015-08-14 NOTE — Patient Instructions (Addendum)
Please complete lab work prior to leaving.  We will check the status of your sleep study appointment. We will get you back in to see Urology due to your prostate problems.

## 2015-08-14 NOTE — Assessment & Plan Note (Signed)
Stable on metformin, obtain follow up a1c.

## 2015-08-14 NOTE — Assessment & Plan Note (Signed)
[facility] requested new sleep study before they could supply CPAP. Will check status of sleep study appointment.

## 2015-08-14 NOTE — Assessment & Plan Note (Signed)
BP mildly low today. Asymptomatic. Continue to monitor on current meds.  Obtain bmet.

## 2015-08-14 NOTE — Assessment & Plan Note (Signed)
UA not suggestive of infection. Will send for culture, continue flomax/proscar, arrange follow up with urology.

## 2015-08-14 NOTE — Assessment & Plan Note (Signed)
Ongoing, refill provided for pain medication. UDS collected.

## 2015-08-15 ENCOUNTER — Encounter: Payer: Self-pay | Admitting: Family

## 2015-08-15 LAB — URINE CULTURE: Organism ID, Bacteria: 10000

## 2015-08-16 NOTE — Telephone Encounter (Signed)
Per verbal from [place], still awaiting call from [facility]. She will call them again this afternoon and then call pt directly once appt has been made. Pt requests that we leave voicemail and if he doesn't respond he didn't get message. Pt is unsure if he has voicemail set up or if he can get into it. Thanks!

## 2015-08-16 NOTE — Telephone Encounter (Signed)
[place] again, again I had to leave message. If I don't hear anything else today, I will call first thing Monday morning.

## 2015-08-20 NOTE — Telephone Encounter (Signed)
[facility] schedules for [facility], they are calling the patient today to scheduled

## 2015-08-21 NOTE — Telephone Encounter (Signed)
Patient is scheduled for 8/27 @ [facility]

## 2015-08-22 ENCOUNTER — Telehealth: Payer: Self-pay | Admitting: Family

## 2015-08-22 NOTE — Telephone Encounter (Signed)
Relation to [id number] Call back [phone]   Reason for call:  Patient states at last OV PCP stated patient wasn't diabetic anymore  and patient would like to know should he continue taking diabetic medication. Please advise.

## 2015-08-23 NOTE — Telephone Encounter (Signed)
Melissa-- I saw no mention in last office note or result note that indicated pt was no longer diabetic.  Please advise below request?

## 2015-08-23 NOTE — Telephone Encounter (Signed)
Notified pt and he voices understanding. 

## 2015-08-23 NOTE — Telephone Encounter (Signed)
Lab Results  Component Value Date   HGBA1C 6.5 08/14/2015   He is still diabetic but controlled. Continue diabetic medications pls.

## 2015-08-30 ENCOUNTER — Telehealth: Payer: Self-pay | Admitting: Family

## 2015-08-30 NOTE — Telephone Encounter (Signed)
Caller name: Relationship to patient: Self Can be reached: [phone]  Pharmacy:  [place], [place] - [address] [phone] ([phone] (Fax)     Reason for call: Patient request a Rx for Viagra. Plse adv

## 2015-09-01 ENCOUNTER — Ambulatory Visit: Payer: PPO | Attending: Family | Admitting: Neurology

## 2015-09-01 DIAGNOSIS — G4733 Obstructive sleep apnea (adult) (pediatric): Secondary | ICD-10-CM | POA: Insufficient documentation

## 2015-09-01 NOTE — Telephone Encounter (Signed)
How often is he using nitroglycerine?  These medications cannot be used together.

## 2015-09-02 NOTE — Telephone Encounter (Signed)
I would not recommend viagra due to use of nitro. If he would like to be referred to urology to discuss alternatives I am happy to refer him.

## 2015-09-02 NOTE — Telephone Encounter (Signed)
Notified pt and he declines referral at this time. States he will just do without medication.

## 2015-09-02 NOTE — Telephone Encounter (Signed)
Spoke with pt and he states that he uses nitroglycerin about twice a week. Also states he completed sleep study last night but didn't sleep that well and was told they might have to repeat the study again.  Please advise.

## 2015-09-02 NOTE — Telephone Encounter (Signed)
Left message for pt to return my call.

## 2015-09-03 ENCOUNTER — Telehealth: Payer: Self-pay | Admitting: Family

## 2015-09-03 NOTE — Telephone Encounter (Signed)
Pt called in, he says that he completed his sleep study but he dont think that he slept long enough. He would like to know when /if a new one is needed.     [phone]

## 2015-09-04 NOTE — Telephone Encounter (Signed)
I do not have report back yet but let him know once I have the results.

## 2015-09-04 NOTE — Telephone Encounter (Signed)
Notified pt and he voices understanding. 

## 2015-09-07 NOTE — Progress Notes (Signed)
[place] A. Merlene Laughter, MD     [email or web address]             NOCTURNAL POLYSOMNOGRAPHY   LOCATION: [facility]  Patient Name: Christian Morris, Christian Morris Date: 09/01/2015 Gender: Male D.O.B: [birth year] Age (years): 41 Referring Provider: Earlie Counts Height (inches): 72 Interpreting Physician: Phillips Odor MD, ABSM Weight (lbs): 250 RPSGT: Rosebud Poles BMI: 34 MRN: [id number] Neck Size: 17.00 CLINICAL INFORMATION Sleep Study Type: NPSG Indication for sleep study: OSA Epworth Sleepiness Score: 7 SLEEP STUDY TECHNIQUE As per the AASM Manual for the Scoring of Sleep and Associated Events v2.3 (April 2016) with a hypopnea requiring 4% desaturations. The channels recorded and monitored were frontal, central and occipital EEG, electrooculogram (EOG), submentalis EMG (chin), nasal and oral airflow, thoracic and abdominal wall motion, anterior tibialis EMG, snore microphone, electrocardiogram, and pulse oximetry. MEDICATIONS Patient's medications include: N/A. Medications self-administered by patient during sleep study : No sleep medicine administered.  Current Outpatient Prescriptions:  .  aspirin EC 81 MG tablet, Take 81 mg by mouth every morning. , Disp: , Rfl:  .  atorvastatin (LIPITOR) 40 MG tablet, TAKE ONE TABLET BY MOUTH ONCE DAILY, Disp: 30 tablet, Rfl: 5 .  diazepam (VALIUM) 10 MG tablet, take 1 tablet by mouth at bedtime if needed for anxiety, Disp: 30 tablet, Rfl: 0 .  finasteride (PROSCAR) 5 MG tablet, Take 5 mg by mouth daily., Disp: , Rfl: 11 .  fish oil-omega-3 fatty acids 1000 MG capsule, Take 2 g by mouth 2 (two) times daily.  , Disp: , Rfl:  .  glucose blood (ONETOUCH VERIO) test strip, Use to check blood sugar two times a day.  DX  E11.9, Disp: 100 each, Rfl: 5 .  hydrochlorothiazide (HYDRODIURIL) 25 MG tablet, TAKE ONE TABLET BY MOUTH ONCE DAILY, Disp: 30 tablet, Rfl: 5 .  HYDROcodone-acetaminophen (NORCO) 7.5-325 MG tablet, Take 1 tablet by  mouth 3 (three) times daily as needed., Disp: 90 tablet, Rfl: 0 .  losartan (COZAAR) 25 MG tablet, TAKE ONE TABLET BY MOUTH ONCE DAILY, Disp: 90 tablet, Rfl: 0 .  metFORMIN (GLUCOPHAGE) 500 MG tablet, TAKE TWO TABLETS BY MOUTH TWICE DAILY WITH FOOD, Disp: 120 tablet, Rfl: 5 .  nitroGLYCERIN (NITROSTAT) 0.4 MG SL tablet, Place 1 tablet (0.4 mg total) under the tongue every 5 (five) minutes x 3 doses as needed for chest pain (donot take with viagra)., Disp: 60 tablet, Rfl: 12 .  ONE TOUCH LANCETS MISC, Use to check blood sugar twice a day.  DX E11.9, Disp: 100 each, Rfl: 5 .  PARoxetine (PAXIL) 30 MG tablet, TAKE ONE TABLET BY MOUTH ONCE DAILY IN THE MORNING, Disp: 30 tablet, Rfl: 5 .  tamsulosin (FLOMAX) 0.4 MG CAPS capsule, TAKE ONE CAPSULE BY MOUTH ONCE DAILY, Disp: 30 capsule, Rfl: 5  SLEEP ARCHITECTURE The study was initiated at 10:14:48 PM and ended at 5:12:47 AM. Sleep onset time was 94.7 minutes and the sleep efficiency was 41.7%. The total sleep time was 174.5 minutes. Stage REM latency was 252.5 minutes. The patient spent 14.33% of the night in stage N1 sleep, 82.81% in stage N2 sleep, 0.00% in stage N3 and 2.87% in REM. Alpha intrusion was absent. Supine sleep was 0.00%. RESPIRATORY PARAMETERS The overall apnea/hypopnea index (AHI) was 58.1 per hour. There were 79 total apneas, including 61 obstructive, 7 central and 11 mixed apneas. There were 90 hypopneas and 0 RERAs. The AHI during Stage REM sleep was 60.0 per hour. AHI while supine was N/A per hour.  The mean oxygen saturation was 91.50%. The minimum SpO2 during sleep was 84.00%. Loud snoring was noted during this study. CARDIAC DATA The 2 lead EKG demonstrated sinus rhythm. The mean heart rate was N/A beats per minute. Other EKG findings include: None. LEG MOVEMENT DATA The total PLMS were 0 with a resulting PLMS index of 0.00. Associated arousal with leg movement index was 0.0.   IMPRESSIONS - Severe obstructive sleep apnea.  She does Auto- Pap 8- 14. - Absent slow-wave sleep.   Delano Metz, MD Diplomate, American Board of Sleep Medicine.

## 2015-09-07 NOTE — Procedures (Signed)
[place] A. Merlene Laughter, MD     [email or web address]             NOCTURNAL POLYSOMNOGRAPHY   LOCATION: [facility]  Patient Name: Christian Morris, Christian Morris Date: 09/01/2015 Gender: Male D.O.B: [birth year] Age (years): 41 Referring Provider: Earlie Counts Height (inches): 72 Interpreting Physician: Phillips Odor MD, ABSM Weight (lbs): 250 RPSGT: Rosebud Poles BMI: 34 MRN: [id number] Neck Size: 17.00 CLINICAL INFORMATION Sleep Study Type: NPSG Indication for sleep study: OSA Epworth Sleepiness Score: 7 SLEEP STUDY TECHNIQUE As per the AASM Manual for the Scoring of Sleep and Associated Events v2.3 (April 2016) with a hypopnea requiring 4% desaturations. The channels recorded and monitored were frontal, central and occipital EEG, electrooculogram (EOG), submentalis EMG (chin), nasal and oral airflow, thoracic and abdominal wall motion, anterior tibialis EMG, snore microphone, electrocardiogram, and pulse oximetry. MEDICATIONS Patient's medications include: N/A. Medications self-administered by patient during sleep study : No sleep medicine administered.  Current Outpatient Prescriptions:  .  aspirin EC 81 MG tablet, Take 81 mg by mouth every morning. , Disp: , Rfl:  .  atorvastatin (LIPITOR) 40 MG tablet, TAKE ONE TABLET BY MOUTH ONCE DAILY, Disp: 30 tablet, Rfl: 5 .  diazepam (VALIUM) 10 MG tablet, take 1 tablet by mouth at bedtime if needed for anxiety, Disp: 30 tablet, Rfl: 0 .  finasteride (PROSCAR) 5 MG tablet, Take 5 mg by mouth daily., Disp: , Rfl: 11 .  fish oil-omega-3 fatty acids 1000 MG capsule, Take 2 g by mouth 2 (two) times daily.  , Disp: , Rfl:  .  glucose blood (ONETOUCH VERIO) test strip, Use to check blood sugar two times a day.  DX  E11.9, Disp: 100 each, Rfl: 5 .  hydrochlorothiazide (HYDRODIURIL) 25 MG tablet, TAKE ONE TABLET BY MOUTH ONCE DAILY, Disp: 30 tablet, Rfl: 5 .  HYDROcodone-acetaminophen (NORCO) 7.5-325 MG tablet, Take 1 tablet by  mouth 3 (three) times daily as needed., Disp: 90 tablet, Rfl: 0 .  losartan (COZAAR) 25 MG tablet, TAKE ONE TABLET BY MOUTH ONCE DAILY, Disp: 90 tablet, Rfl: 0 .  metFORMIN (GLUCOPHAGE) 500 MG tablet, TAKE TWO TABLETS BY MOUTH TWICE DAILY WITH FOOD, Disp: 120 tablet, Rfl: 5 .  nitroGLYCERIN (NITROSTAT) 0.4 MG SL tablet, Place 1 tablet (0.4 mg total) under the tongue every 5 (five) minutes x 3 doses as needed for chest pain (donot take with viagra)., Disp: 60 tablet, Rfl: 12 .  ONE TOUCH LANCETS MISC, Use to check blood sugar twice a day.  DX E11.9, Disp: 100 each, Rfl: 5 .  PARoxetine (PAXIL) 30 MG tablet, TAKE ONE TABLET BY MOUTH ONCE DAILY IN THE MORNING, Disp: 30 tablet, Rfl: 5 .  tamsulosin (FLOMAX) 0.4 MG CAPS capsule, TAKE ONE CAPSULE BY MOUTH ONCE DAILY, Disp: 30 capsule, Rfl: 5  SLEEP ARCHITECTURE The study was initiated at 10:14:48 PM and ended at 5:12:47 AM. Sleep onset time was 94.7 minutes and the sleep efficiency was 41.7%. The total sleep time was 174.5 minutes. Stage REM latency was 252.5 minutes. The patient spent 14.33% of the night in stage N1 sleep, 82.81% in stage N2 sleep, 0.00% in stage N3 and 2.87% in REM. Alpha intrusion was absent. Supine sleep was 0.00%. RESPIRATORY PARAMETERS The overall apnea/hypopnea index (AHI) was 58.1 per hour. There were 79 total apneas, including 61 obstructive, 7 central and 11 mixed apneas. There were 90 hypopneas and 0 RERAs. The AHI during Stage REM sleep was 60.0 per hour. AHI while supine was N/A per hour.  The mean oxygen saturation was 91.50%. The minimum SpO2 during sleep was 84.00%. Loud snoring was noted during this study. CARDIAC DATA The 2 lead EKG demonstrated sinus rhythm. The mean heart rate was N/A beats per minute. Other EKG findings include: None. LEG MOVEMENT DATA The total PLMS were 0 with a resulting PLMS index of 0.00. Associated arousal with leg movement index was 0.0.   IMPRESSIONS - Severe obstructive sleep apnea.  She does Auto- Pap 8- 14. - Absent slow-wave sleep.   Delano Metz, MD Diplomate, American Board of Sleep Medicine.

## 2015-09-10 ENCOUNTER — Telehealth: Payer: Self-pay | Admitting: Family

## 2015-09-10 DIAGNOSIS — G4730 Sleep apnea, unspecified: Secondary | ICD-10-CM

## 2015-09-10 NOTE — Telephone Encounter (Signed)
Notified pt and he needs [place] to arrange CPAP set up. I pended referral but wasn't sure about some of the answers. Please review and sign.

## 2015-09-10 NOTE — Telephone Encounter (Signed)
Melissa-- Please advise result?

## 2015-09-10 NOTE — Telephone Encounter (Signed)
Shows severe sleep apnea.  Does he need me to arrange home cpap?

## 2015-09-10 NOTE — Telephone Encounter (Signed)
Pt called in to be advised about his sleep study. Pt would like a call back from [place].

## 2015-09-11 NOTE — Telephone Encounter (Signed)
Anderson Malta, just checking that you can see the cpap order I placed. Thanks.

## 2015-09-11 NOTE — Telephone Encounter (Signed)
I am unable to see CPAP order

## 2015-09-11 NOTE — Telephone Encounter (Signed)
Pt called in. He says that he would like to have the C Pap machine that uses water. He says that his mouth get dry at night.

## 2015-09-11 NOTE — Telephone Encounter (Signed)
That was included in original order.

## 2015-09-12 DIAGNOSIS — G4733 Obstructive sleep apnea (adult) (pediatric): Secondary | ICD-10-CM | POA: Diagnosis not present

## 2015-09-12 NOTE — Telephone Encounter (Signed)
Pt tel [phone]  Pt called stating wanting to know where does he has to go to get a cheap C PAP machine, pt states already had someone call him to let him know that he has to visit them to get his C PAP machine but pt was not sure if that place is the best place to get his machine. (Pt also mentioned that he did not get the name of place that called him but does have there Tel phone #). Please advise.

## 2015-09-13 ENCOUNTER — Telehealth: Payer: Self-pay | Admitting: *Deleted

## 2015-09-13 MED ORDER — HYDROCODONE-ACETAMINOPHEN 7.5-325 MG PO TABS: 1.0000 | ORAL_TABLET | Freq: Three times a day (TID) | ORAL | 0 refills | Status: DC | PRN

## 2015-09-13 MED FILL — HYDROCODON-APAP 7.5-325: 7.5-325 | 30 days supply | Qty: 90 | Fill #0

## 2015-09-13 NOTE — Telephone Encounter (Signed)
See below response from [place]: From: Jiles Crocker  Sent: 09/13/2015  10:50 AM To: Ronny Flurry, CMA Subject: RE: CPAP                                       Here is the note from his account from 9/7 at 8:36am.  appt forcpap scheduled for 9/7 at 130pm with trey in [place]. pt is aware of aware of pfr and autopay. mpf

## 2015-09-13 NOTE — Telephone Encounter (Signed)
Spoke with pt and he states that someone has contacted him about CPAP machine but doesn't remember who it was. They did discuss financial portion and pt states he is getting set up. Sent message to Darlina Guys ([place] contact) to verify they contacted pt.

## 2015-09-13 NOTE — Telephone Encounter (Signed)
Pt requesting refill of hydrocodone. Last Refill: 08/14/15, #90 Last OV: 08/14/15 Next OV: 11/2015 UDS: moderate, 08/14/15.  Rx printed and forwarded to PCP for signature.

## 2015-09-13 NOTE — Telephone Encounter (Signed)
Rx placed at front desk for pick up and pt was notified.

## 2015-09-20 ENCOUNTER — Ambulatory Visit: Payer: PPO | Admitting: Urology

## 2015-09-24 ENCOUNTER — Telehealth: Payer: Self-pay | Admitting: Family

## 2015-09-24 NOTE — Telephone Encounter (Signed)
Caller name:Sota Nemes Relationship to patient: Can be reached: Pharmacy:  Reason for call:requesting refill on HYDROcodone-acetaminophen (NORCO) 7.5-325 MG tablet

## 2015-09-25 NOTE — Telephone Encounter (Signed)
Notified pt that he picked up written Rx for Hydrocodone on 09/13/15 and it is too soon to refill at this time. Pt states he thought it had already been a month and confused on his dates. States he is not out and will call back when it is time to renew the Rx.

## 2015-09-30 DIAGNOSIS — G4733 Obstructive sleep apnea (adult) (pediatric): Secondary | ICD-10-CM | POA: Diagnosis not present

## 2015-10-07 ENCOUNTER — Telehealth: Payer: Self-pay | Admitting: Family

## 2015-10-07 MED ORDER — DIAZEPAM 10 MG PO TABS: ORAL_TABLET | ORAL | 0 refills | Status: DC

## 2015-10-07 NOTE — Telephone Encounter (Signed)
Last rx:  04/18/15, #30 UDS: 08/14/15 Next OV:  11/13/15  Rx printed and forwarded to PCP for signature.

## 2015-10-07 NOTE — Telephone Encounter (Signed)
Rx faxed to pharmacy, Pt notified.

## 2015-10-07 NOTE — Telephone Encounter (Signed)
°  Relation to [id number] Call back number:[phone] Pharmacy:[facility]   Reason for call: pt is needing rx diazepam (VALIUM) 10 MG tablet, please call to make pt know when available for pick up

## 2015-10-11 ENCOUNTER — Telehealth: Payer: Self-pay | Admitting: Family

## 2015-10-11 MED ORDER — HYDROCODONE-ACETAMINOPHEN 7.5-325 MG PO TABS: 1.0000 | ORAL_TABLET | Freq: Three times a day (TID) | ORAL | 0 refills | Status: DC | PRN

## 2015-10-11 MED FILL — HYDROCODON-APAP 7.5-325: 7.5-325 | 30 days supply | Qty: 90 | Fill #0

## 2015-10-11 NOTE — Telephone Encounter (Signed)
Last Rx:  09/13/15, #90 UDS: due 11/2015 Next OV: 11/2015  Rx printed and forwarded to PCP for signature.

## 2015-10-11 NOTE — Telephone Encounter (Signed)
Relation to [id number] Call back number:[phone]   Reason for call:  Patient requesting a refill HYDROcodone-acetaminophen (NORCO) 7.5-325 MG tablet

## 2015-10-11 NOTE — Telephone Encounter (Signed)
Rx placed at front desk for pick up and pt has been notified. 

## 2015-10-12 DIAGNOSIS — G4733 Obstructive sleep apnea (adult) (pediatric): Secondary | ICD-10-CM | POA: Diagnosis not present

## 2015-11-01 ENCOUNTER — Ambulatory Visit: Payer: PPO | Admitting: Urology

## 2015-11-06 ENCOUNTER — Other Ambulatory Visit: Payer: Self-pay | Admitting: Family

## 2015-11-06 NOTE — Telephone Encounter (Signed)
Last Diazepam Rx was filled on 10/07/15, #30. Last OV: 08/14/15 Next Ov: 11/13/15 UDS: 08/14/15 Moderate risk. Rx printed and forwarded to PCP for signature.

## 2015-11-06 NOTE — Telephone Encounter (Signed)
Rx faxed to the Pharmacy on 11/06/15.

## 2015-11-08 ENCOUNTER — Telehealth: Payer: Self-pay | Admitting: Family

## 2015-11-08 DIAGNOSIS — Z79891 Long term (current) use of opiate analgesic: Secondary | ICD-10-CM | POA: Diagnosis not present

## 2015-11-08 DIAGNOSIS — Z79899 Other long term (current) drug therapy: Secondary | ICD-10-CM | POA: Diagnosis not present

## 2015-11-08 MED ORDER — HYDROCODONE-ACETAMINOPHEN 7.5-325 MG PO TABS: 1.0000 | ORAL_TABLET | Freq: Three times a day (TID) | ORAL | 0 refills | Status: DC | PRN

## 2015-11-08 NOTE — Telephone Encounter (Signed)
°  Relation to [id number]  Call back [phone]   Reason for call:  Patient requesting a refill HYDROcodone-acetaminophen (NORCO) 7.5-325 MG tablet

## 2015-11-08 NOTE — Telephone Encounter (Signed)
Last Rx: 10/11/15, #90 UDS: 08/14/15,  Due now and will collect when he picks up Rx.  Rx printed and forwarded to PCP for signature.

## 2015-11-08 NOTE — Telephone Encounter (Signed)
Patient aware and will pick up script today.

## 2015-11-08 NOTE — Telephone Encounter (Signed)
Rx given to pt. 

## 2015-11-12 DIAGNOSIS — G4733 Obstructive sleep apnea (adult) (pediatric): Secondary | ICD-10-CM | POA: Diagnosis not present

## 2015-11-13 ENCOUNTER — Encounter: Payer: Self-pay | Admitting: Family

## 2015-11-13 ENCOUNTER — Ambulatory Visit (INDEPENDENT_AMBULATORY_CARE_PROVIDER_SITE_OTHER): Payer: PPO | Admitting: Family

## 2015-11-13 VITALS — BP 114/78 | HR 52 | Temp 98.0°F | Resp 20 | Ht 72.0 in | Wt 254.0 lb

## 2015-11-13 DIAGNOSIS — Z23 Encounter for immunization: Secondary | ICD-10-CM

## 2015-11-13 DIAGNOSIS — E118 Type 2 diabetes mellitus with unspecified complications: Secondary | ICD-10-CM | POA: Diagnosis not present

## 2015-11-13 DIAGNOSIS — G4733 Obstructive sleep apnea (adult) (pediatric): Secondary | ICD-10-CM

## 2015-11-13 DIAGNOSIS — Z794 Long term (current) use of insulin: Secondary | ICD-10-CM

## 2015-11-13 DIAGNOSIS — J42 Unspecified chronic bronchitis: Secondary | ICD-10-CM | POA: Diagnosis not present

## 2015-11-13 DIAGNOSIS — I10 Essential (primary) hypertension: Secondary | ICD-10-CM

## 2015-11-13 LAB — BASIC METABOLIC PANEL
BUN: 37 mg/dL — ABNORMAL HIGH (ref 6–23)
CALCIUM: 10.2 mg/dL (ref 8.4–10.5)
CO2: 28 meq/L (ref 19–32)
Chloride: 100 mEq/L (ref 96–112)
Creatinine, Ser: 1.09 mg/dL (ref 0.40–1.50)
GFR: 69.69 mL/min (ref >60.00)
Glucose, Bld: 110 mg/dL — ABNORMAL HIGH (ref 70–99)
POTASSIUM: 4.2 meq/L (ref 3.5–5.1)
SODIUM: 138 meq/L (ref 135–145)

## 2015-11-13 LAB — HEMOGLOBIN A1C: Hgb A1c MFr Bld: 6.3 % (ref 4.6–6.5)

## 2015-11-13 NOTE — Patient Instructions (Signed)
Please complete lab work prior to leaving.   

## 2015-11-13 NOTE — Progress Notes (Signed)
Subjective:    Patient ID: Christian Morris, male    DOB: Jun 21, 1940, 75 y.o.   MRN: [id number]  HPI  Christian Morris is a 75 yr old male who presents today for follow up.   1) DM2-  Not checking his sugars.  Diet is fair.    Lab Results  Component Value Date   HGBA1C 6.5 08/14/2015   HGBA1C 6.6 (H) 05/13/2015   HGBA1C 6.3 02/11/2015   Lab Results  Component Value Date   MICROALBUR 11.4 (H) 08/17/2014   LDLCALC 66 08/17/2014   CREATININE 1.12 08/14/2015   HTN- maintained on losaratan BP Readings from Last 3 Encounters:  11/13/15 114/78  08/14/15 100/65  05/17/15 114/72   OSA- reports good compliance with CPAP. Denies daytime somnolence.    Review of Systems  Past Medical History:  Diagnosis Date  . Anxiety   . Arthritis    "legs" (02/21/2014)  . Chronic lower back pain   . Daily headache    "here lately" (02/21/2014)  . Depression   . Fatty liver disease, nonalcoholic   . GERD (gastroesophageal reflux disease)   . History of blood transfusion ~ 1958   "related to bleeding ulcers"  . History of gastric ulcer ~ 1958   no recurrence since.  . Hyperlipidemia   . Hypertension   . Hypogonadism male 01/11/2011  . Kidney stones   . Lung nodule    right 16 mm, seen initially 6/12. 30mm in 08/2011.  . OSA on CPAP   . Squamous cell cancer of skin of forearm 01/23/2011   left  . Type II diabetes mellitus ([place])   . Urinary hesitancy   . Vitamin D deficiency 11/19/2014     Social History   Social History  . Marital status: Divorced    Spouse name: N/A  . Number of children: 3  . Years of education: N/A   Occupational History  . RETIRED BUS DRIVER Retired   Social History Main Topics  . Smoking status: Former Smoker    Packs/day: 1.00    Years: 25.00    Types: Cigarettes    Quit date: 01/05/1982  . Smokeless tobacco: Never Used  . Alcohol use 0.0 oz/week     Comment: "quit drinking in the 1980's"  . Drug use: No  . Sexual activity: Yes   Other Topics Concern    . 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    Past Surgical History:  Procedure Laterality Date  . CARDIAC CATHETERIZATION    . CATARACT EXTRACTION W/ INTRAOCULAR LENS IMPLANT Left 08/2009   Dr Katy Fitch  . LEFT HEART CATHETERIZATION WITH CORONARY ANGIOGRAM N/A 11/17/2012   Procedure: LEFT HEART CATHETERIZATION WITH CORONARY ANGIOGRAM;  Surgeon: Burnell Blanks, MD;  Location: [facility] CATH LAB;  Service: Cardiovascular;  Laterality: N/A;  . MELANOMA EXCISION Left 01/23/2011   forearm    Family History  Problem Relation Age of Onset  . Arthritis Mother   . Heart disease Sister     Massive MI age 60.  . Arrhythmia Brother   . Melanoma Son   . Heart attack Brother     No Known Allergies  Current Outpatient Prescriptions on File Prior to Visit  Medication Sig Dispense Refill  . aspirin EC 81 MG tablet Take 81 mg by mouth every morning.     [name] atorvastatin (LIPITOR) 40 MG tablet TAKE  ONE TABLET BY MOUTH ONCE DAILY 30 tablet 5  . diazepam (VALIUM) 10 MG tablet TAKE ONE TABLET BY MOUTH AT BEDTIME IF NEEDED FOR ANXIETY 30 tablet 0  . finasteride (PROSCAR) 5 MG tablet Take 5 mg by mouth daily.  11  . fish oil-omega-3 fatty acids 1000 MG capsule Take 2 g by mouth 2 (two) times daily.      [name] glucose blood (ONETOUCH VERIO) test strip Use to check blood sugar two times a day.  DX  E11.9 100 each 5  . hydrochlorothiazide (HYDRODIURIL) 25 MG tablet TAKE ONE TABLET BY MOUTH ONCE DAILY 30 tablet 5  . HYDROcodone-acetaminophen (NORCO) 7.5-325 MG tablet Take 1 tablet by mouth 3 (three) times daily as needed. 90 tablet 0  . losartan (COZAAR) 25 MG tablet TAKE ONE TABLET BY MOUTH ONCE DAILY 90 tablet 0  . metFORMIN (GLUCOPHAGE) 500 MG tablet TAKE TWO TABLETS BY MOUTH TWICE DAILY WITH FOOD 120 tablet 5  . nitroGLYCERIN (NITROSTAT) 0.4 MG SL tablet Place 1 tablet (0.4 mg total) under the tongue every 5 (five) minutes x 3  doses as needed for chest pain (donot take with viagra). 60 tablet 12  . ONE TOUCH LANCETS MISC Use to check blood sugar twice a day.  DX E11.9 100 each 5  . PARoxetine (PAXIL) 30 MG tablet TAKE ONE TABLET BY MOUTH ONCE DAILY IN THE MORNING 30 tablet 5  . tamsulosin (FLOMAX) 0.4 MG CAPS capsule TAKE ONE CAPSULE BY MOUTH ONCE DAILY 30 capsule 5   No current facility-administered medications on file prior to visit.     BP 114/78 (BP Location: Right Arm, Cuff Size: Normal)   Pulse (!) 52   Temp 98 F (36.7 C) (Oral)   Resp 20   Ht 6' (1.829 m)   Wt 254 lb (115.2 kg)   SpO2 100% Comment: room air  BMI 34.45 kg/m        Objective:   Physical Exam  Constitutional: He is oriented to person, place, and time. He appears well-developed and well-nourished. No distress.  HENT:  Head: Normocephalic and atraumatic.  Cardiovascular: Normal rate and regular rhythm.   No murmur heard. Pulmonary/Chest: Effort normal and breath sounds normal. No respiratory distress. He has no wheezes. He has no rales.  Musculoskeletal: He exhibits no edema.  Neurological: He is alert and oriented to person, place, and time.  Skin: Skin is warm and dry.  Psychiatric: He has a normal mood and affect. His behavior is normal. Thought content normal.          Assessment & Plan:

## 2015-11-13 NOTE — Progress Notes (Signed)
Pre visit review using our clinic review tool, if applicable. No additional management support is needed unless otherwise documented below in the visit note. 

## 2015-11-15 ENCOUNTER — Telehealth: Payer: Self-pay | Admitting: Family

## 2015-11-16 NOTE — Assessment & Plan Note (Addendum)
Maintained on losartan. BP stable. Continue same.

## 2015-11-16 NOTE — Assessment & Plan Note (Signed)
Stable, continue cpap.  

## 2015-11-16 NOTE — Assessment & Plan Note (Signed)
Discussed healthy diet, exercise. Continue metformin.

## 2015-11-16 NOTE — Assessment & Plan Note (Signed)
Currently stable. Monitor.  

## 2015-11-20 ENCOUNTER — Other Ambulatory Visit: Payer: Self-pay | Admitting: Family

## 2015-11-21 NOTE — Telephone Encounter (Signed)
error:[id number] ° °

## 2015-12-04 ENCOUNTER — Telehealth: Payer: Self-pay | Admitting: *Deleted

## 2015-12-04 NOTE — Telephone Encounter (Signed)
Received fax from [facility] requesting refill for finasteride 5mg  once a day. It doesn't look like we have prescribed this for pt before? Is it ok to send Rx or should request go to urologist?

## 2015-12-05 MED ORDER — FINASTERIDE 5 MG PO TABS: 5.0000 mg | ORAL_TABLET | Freq: Every day | ORAL | 5 refills | Status: DC

## 2015-12-09 ENCOUNTER — Telehealth: Payer: Self-pay | Admitting: Family

## 2015-12-09 NOTE — Telephone Encounter (Signed)
Last OV:  11/13/15 Next OV: 02/2016 UDS: 11/08/15.  Most recent UDS did not show hydrocodone present. Please advise request?

## 2015-12-09 NOTE — Telephone Encounter (Signed)
Please contact pt and let him know that I reviewed his UDS.  UDS neg for hydrocodone. Reviewed controlled substance registry and he has been filling 90 tabs every 30 days.  I will no longer be able to refill his controlled substances due to these findings since they violate his controlled substance agreement.

## 2015-12-09 NOTE — Telephone Encounter (Signed)
Caller name: Relationship to patient: Self Can be reached:[phone]  Pharmacy:  Reason for call: Refill HYDROcodone-acetaminophen (NORCO) 7.5-325 MG tablet [id number]

## 2015-12-10 NOTE — Telephone Encounter (Signed)
Notified pt. He states that he takes his medication every day and doesn't understand why it did not show up in UDS. Advised pt that it should have been positive and we will not be able to continue prescribing controlled substances. Advised pt we could refer him to pain management but there is no guarantee that he will be accepted by them. Pt will think about what he wants to do and call us back.

## 2015-12-10 NOTE — Telephone Encounter (Signed)
Patient calling regarding Rx mentioned below, please advise best # [phone]

## 2015-12-12 DIAGNOSIS — G4733 Obstructive sleep apnea (adult) (pediatric): Secondary | ICD-10-CM | POA: Diagnosis not present

## 2015-12-24 ENCOUNTER — Emergency Department (HOSPITAL_COMMUNITY)
Admission: EM | Admit: 2015-12-24 | Discharge: 2015-12-24 | Disposition: A | Discharge status: Home / Self Care | Payer: PPO | Attending: Emergency Medicine | Admitting: Emergency Medicine

## 2015-12-24 ENCOUNTER — Emergency Department (HOSPITAL_COMMUNITY): Payer: PPO

## 2015-12-24 ENCOUNTER — Encounter (HOSPITAL_COMMUNITY): Payer: Self-pay

## 2015-12-24 DIAGNOSIS — Z7982 Long term (current) use of aspirin: Secondary | ICD-10-CM | POA: Insufficient documentation

## 2015-12-24 DIAGNOSIS — E119 Type 2 diabetes mellitus without complications: Secondary | ICD-10-CM | POA: Diagnosis not present

## 2015-12-24 DIAGNOSIS — J449 Chronic obstructive pulmonary disease, unspecified: Secondary | ICD-10-CM | POA: Diagnosis not present

## 2015-12-24 DIAGNOSIS — J40 Bronchitis, not specified as acute or chronic: Secondary | ICD-10-CM | POA: Insufficient documentation

## 2015-12-24 DIAGNOSIS — I10 Essential (primary) hypertension: Secondary | ICD-10-CM | POA: Insufficient documentation

## 2015-12-24 DIAGNOSIS — Z79899 Other long term (current) drug therapy: Secondary | ICD-10-CM | POA: Insufficient documentation

## 2015-12-24 DIAGNOSIS — R05 Cough: Secondary | ICD-10-CM | POA: Diagnosis not present

## 2015-12-24 DIAGNOSIS — Z7984 Long term (current) use of oral hypoglycemic drugs: Secondary | ICD-10-CM | POA: Diagnosis not present

## 2015-12-24 DIAGNOSIS — R079 Chest pain, unspecified: Secondary | ICD-10-CM | POA: Diagnosis not present

## 2015-12-24 DIAGNOSIS — Z87891 Personal history of nicotine dependence: Secondary | ICD-10-CM | POA: Diagnosis not present

## 2015-12-24 DIAGNOSIS — G4733 Obstructive sleep apnea (adult) (pediatric): Secondary | ICD-10-CM | POA: Diagnosis not present

## 2015-12-24 LAB — CBC WITH DIFFERENTIAL/PLATELET
Basophils Absolute: 0.1 10*3/uL (ref 0.0–0.1)
Basophils Relative: 1 %
EOS ABS: 0.3 10*3/uL (ref 0.0–0.7)
EOS PCT: 2 %
HCT: 41.9 % (ref 39.0–52.0)
Hemoglobin: 13.6 g/dL (ref 13.0–17.0)
LYMPHS ABS: 3.2 10*3/uL (ref 0.7–4.0)
Lymphocytes Relative: 28 %
MCH: 29.5 pg (ref 26.0–34.0)
MCHC: 32.5 g/dL (ref 30.0–36.0)
MCV: 90.9 fL (ref 78.0–100.0)
MONOS PCT: 6 %
Monocytes Absolute: 0.7 10*3/uL (ref 0.1–1.0)
Neutro Abs: 7.3 10*3/uL (ref 1.7–7.7)
Neutrophils Relative %: 63 %
PLATELETS: 257 10*3/uL (ref 150–400)
RBC: 4.61 MIL/uL (ref 4.22–5.81)
RDW: 13.1 % (ref 11.5–15.5)
WBC: 11.6 10*3/uL — ABNORMAL HIGH (ref 4.0–10.5)

## 2015-12-24 LAB — BASIC METABOLIC PANEL
Anion gap: 9 (ref 5–15)
BUN: 33 mg/dL — AB (ref 6–20)
CALCIUM: 9.2 mg/dL (ref 8.9–10.3)
CO2: 24 mmol/L (ref 22–32)
CREATININE: 1.17 mg/dL (ref 0.61–1.24)
Chloride: 102 mmol/L (ref 101–111)
GFR calc Af Amer: >60 mL/min (ref >60)
GFR, EST NON AFRICAN AMERICAN: 58 mL/min — AB (ref >60)
Glucose, Bld: 109 mg/dL — ABNORMAL HIGH (ref 65–99)
Potassium: 3.3 mmol/L — ABNORMAL LOW (ref 3.5–5.1)
Sodium: 135 mmol/L (ref 135–145)

## 2015-12-24 MED ORDER — AZITHROMYCIN 250 MG PO TABS: ORAL_TABLET | ORAL | 0 refills | Status: DC

## 2015-12-24 NOTE — ED Triage Notes (Signed)
Called for pt at 1635 but no answer.

## 2015-12-24 NOTE — ED Triage Notes (Signed)
Pt reports productive cough and pain in left chest radiating down left side of abd for the past 2 weeks.  Reports worse today.

## 2015-12-24 NOTE — ED Provider Notes (Signed)
[facility] DEPT Provider Note   CSN: [id number] Arrival date & time: 12/24/15  1626     History   Chief Complaint Chief Complaint  Patient presents with  . Cough  . Chest Pain    HPI Christian Morris is a 75 y.o. male.  Patient complains of a cough for a few days with some chills   The history is provided by the patient.  Cough  This is a new problem. The current episode started 2 days ago. The problem occurs constantly. The problem has not changed since onset.The cough is non-productive. There has been no fever. Associated symptoms include chest pain. Pertinent negatives include no headaches.  Chest Pain   Associated symptoms include cough. Pertinent negatives include no abdominal pain, no back pain and no headaches.  Pertinent negatives for past medical history include no seizures.    Past Medical History:  Diagnosis Date  . Anxiety   . Arthritis    "legs" (02/21/2014)  . Chronic lower back pain   . Daily headache    "here lately" (02/21/2014)  . Depression   . Fatty liver disease, nonalcoholic   . GERD (gastroesophageal reflux disease)   . History of blood transfusion ~ 1958   "related to bleeding ulcers"  . History of gastric ulcer ~ 1958   no recurrence since.  . Hyperlipidemia   . Hypertension   . Hypogonadism male 01/11/2011  . Kidney stones   . Lung nodule    right 16 mm, seen initially 6/12. 16mm in 08/2011.  . OSA on CPAP   . Squamous cell cancer of skin of forearm 01/23/2011   left  . Type II diabetes mellitus ([place])   . Urinary hesitancy   . Vitamin D deficiency 11/19/2014    Patient Active Problem List   Diagnosis Date Noted  . Memory loss 02/11/2015  . Vitamin D deficiency 11/19/2014  . Loss of weight 11/11/2014  . Hematuria 03/12/2014  . Frequent falls 11/14/2013  . Anemia 12/16/2012  . BPH (benign prostatic hyperplasia) 10/28/2012  . Nephrolithiasis 10/28/2012  . Environmental allergies 02/01/2012  . Squamous cell carcinoma in situ of  skin of forearm 01/23/2011  . Hypogonadism male 01/11/2011  . Plantar fasciitis 05/19/2010  . ERECTILE DYSFUNCTION, ORGANIC 12/31/2009  . COPD (chronic obstructive pulmonary disease) ([place]) 08/16/2009  . GERD 08/16/2009  . Hepatic steatosis 08/16/2009  . LOW BACK PAIN, CHRONIC 08/16/2009  . MICROALBUMINURIA 08/16/2009  . Diabetes type 2, controlled ([place]) 08/06/2009  . Hyperlipidemia 08/06/2009  . Obesity (BMI 30.0-34.9) 08/06/2009  . ANXIETY DEPRESSION 08/06/2009  . OSA (obstructive sleep apnea) 08/06/2009  . Essential hypertension 08/06/2009  . OSTEOARTHRITIS, KNEES, BILATERAL, MILD 08/06/2009  . PUD, HX OF 08/06/2009  . COLONIC POLYPS, HX OF 08/06/2009    Past Surgical History:  Procedure Laterality Date  . CARDIAC CATHETERIZATION    . CATARACT EXTRACTION W/ INTRAOCULAR LENS IMPLANT Left 08/2009   Dr Katy Fitch  . LEFT HEART CATHETERIZATION WITH CORONARY ANGIOGRAM N/A 11/17/2012   Procedure: LEFT HEART CATHETERIZATION WITH CORONARY ANGIOGRAM;  Surgeon: Burnell Blanks, MD;  Location: [facility] CATH LAB;  Service: Cardiovascular;  Laterality: N/A;  . MELANOMA EXCISION Left 01/23/2011   forearm       Home Medications    Prior to Admission medications   Medication Sig Start Date End Date Taking? Authorizing Provider  aspirin EC 81 MG tablet Take 81 mg by mouth every morning.    Yes Historical Provider, MD  fish oil-omega-3 fatty acids 1000 MG  capsule Take 2 g by mouth 2 (two) times daily.     Yes Historical Provider, MD  hydrochlorothiazide (HYDRODIURIL) 25 MG tablet TAKE ONE TABLET BY MOUTH ONCE DAILY 08/13/15  Yes [name], NP  metFORMIN (GLUCOPHAGE) 500 MG tablet TAKE TWO TABLETS BY MOUTH TWICE DAILY WITH FOOD 07/25/15  Yes [name], NP  naproxen sodium (ALEVE) 220 MG tablet Take 220-440 mg by mouth daily as needed (for pain).   Yes Historical Provider, MD  nitroGLYCERIN (NITROSTAT) 0.4 MG SL tablet Place 1 tablet (0.4 mg total) under the tongue every 5 (five)  minutes x 3 doses as needed for chest pain (donot take with viagra). 04/17/14  Yes [name], NP  tamsulosin (FLOMAX) 0.4 MG CAPS capsule TAKE ONE CAPSULE BY MOUTH ONCE DAILY Patient taking differently: TAKE ONE CAPSULE BY MOUTH ONCE IN THE EVENING 08/13/15  Yes [name], NP  atorvastatin (LIPITOR) 40 MG tablet TAKE ONE TABLET BY MOUTH ONCE DAILY 08/13/15   [name], NP  azithromycin (ZITHROMAX Z-PAK) 250 MG tablet 2 po day one, then 1 daily x 4 days 12/24/15   Milton Ferguson, MD  diazepam (VALIUM) 10 MG tablet TAKE ONE TABLET BY MOUTH AT BEDTIME IF NEEDED FOR ANXIETY 11/06/15   [name], NP  finasteride (PROSCAR) 5 MG tablet Take 1 tablet (5 mg total) by mouth daily. 12/05/15   [name], NP  glucose blood (ONETOUCH VERIO) test strip Use to check blood sugar two times a day.  DX  E11.9 02/11/15   [name], NP  HYDROcodone-acetaminophen (NORCO) 7.5-325 MG tablet Take 1 tablet by mouth 3 (three) times daily as needed. Patient not taking: Reported on 12/24/2015 11/08/15   [name], NP  losartan (COZAAR) 25 MG tablet TAKE ONE TABLET BY MOUTH ONCE DAILY 11/21/15   [name], NP  ONE TOUCH LANCETS MISC Use to check blood sugar twice a day.  DX E11.9 02/13/15   [name], NP  PARoxetine (PAXIL) 30 MG tablet TAKE ONE TABLET BY MOUTH ONCE DAILY IN THE MORNING 08/13/15   [name], NP    Family History Family History  Problem Relation Age of Onset  . Arthritis Mother   . Heart disease Sister     Massive MI age 28.  . Arrhythmia Brother   . Melanoma Son   . Heart attack Brother     Social History Social History  Substance Use Topics  . Smoking status: Former Smoker    Packs/day: 1.00    Years: 25.00    Types: Cigarettes    Quit date: 01/05/1982  . Smokeless tobacco: Never Used  . Alcohol use 0.0 oz/week     Comment: "quit drinking in the 1980's"     Allergies   Patient has no known allergies.   Review of  Systems Review of Systems  Constitutional: Negative for appetite change and fatigue.  HENT: Negative for congestion, ear discharge and sinus pressure.   Eyes: Negative for discharge.  Respiratory: Positive for cough.   Cardiovascular: Positive for chest pain.  Gastrointestinal: Negative for abdominal pain and diarrhea.  Genitourinary: Negative for frequency and hematuria.  Musculoskeletal: Negative for back pain.  Skin: Negative for rash.  Neurological: Negative for seizures and headaches.  Psychiatric/Behavioral: Negative for hallucinations.     Physical Exam Updated Vital Signs BP 127/63   Pulse (!) 56   Temp 98.3 F (36.8 C) (Oral)   Resp 18   Ht 6' (1.829 m)   Wt 255 lb (115.7 kg)  SpO2 98%   BMI 34.58 kg/m   Physical Exam  Constitutional: He is oriented to person, place, and time. He appears well-developed.  HENT:  Head: Normocephalic.  Eyes: Conjunctivae and EOM are normal. No scleral icterus.  Neck: Neck supple. No thyromegaly present.  Cardiovascular: Normal rate and regular rhythm.  Exam reveals no gallop and no friction rub.   No murmur heard. Pulmonary/Chest: No stridor. He has no wheezes. He has no rales. He exhibits no tenderness.  Abdominal: He exhibits no distension. There is no tenderness. There is no rebound.  Musculoskeletal: Normal range of motion. He exhibits no edema.  Lymphadenopathy:    He has no cervical adenopathy.  Neurological: He is oriented to person, place, and time. He exhibits normal muscle tone. Coordination normal.  Skin: No rash noted. No erythema.  Psychiatric: He has a normal mood and affect. His behavior is normal.     ED Treatments / Results  Labs (all labs ordered are listed, but only abnormal results are displayed) Labs Reviewed  CBC WITH DIFFERENTIAL/PLATELET - Abnormal; Notable for the following:       Result Value   WBC 11.6 (*)    All other components within normal limits  BASIC METABOLIC PANEL - Abnormal; Notable  for the following:    Potassium 3.3 (*)    Glucose, Bld 109 (*)    BUN 33 (*)    GFR calc non Af Amer 58 (*)    All other components within normal limits    EKG  EKG Interpretation  Date/Time:  Tuesday December 24 2015 17:06:58 EST Ventricular Rate:  57 PR Interval:  182 QRS Duration: 112 QT Interval:  456 QTC Calculation: 443 R Axis:   -46 Text Interpretation:  Sinus bradycardia Pulmonary disease pattern Left anterior fascicular block Minimal voltage criteria for LVH, may be normal variant Abnormal ECG Confirmed by Yoland Scherr  MD, Broadus John [phone]) on 12/24/2015 6:49:43 PM       Radiology Dg Chest 2 View  Result Date: 12/24/2015 CLINICAL DATA:  75 year old male with productive cough shortness of breath and left chest pain for 2 days. Initial encounter. EXAM: CHEST  2 VIEW COMPARISON:  02/12/2015 and earlier. FINDINGS: Stable large lung volumes. Stable cardiomegaly and mediastinal contours. Visualized tracheal air column is within normal limits. Visualized tracheal air column is within normal limits. No pneumothorax, pulmonary edema, pleural effusion or confluent pulmonary opacity. Osteopenia. No acute osseous abnormality identified. IMPRESSION: No acute cardiopulmonary abnormality. Electronically Signed   By: Genevie Ann M.D.   On: 12/24/2015 17:48    Procedures Procedures (including critical care time)  Medications Ordered in ED Medications - No data to display   Initial Impression / Assessment and Plan / ED Course  I have reviewed the triage vital signs and the nursing notes.  Pertinent labs & imaging results that were available during my care of the patient were reviewed by me and considered in my medical decision making (see chart for details).  Clinical Course     Labs chest x-ray unremarkable. Patient will be placed on a Z-Pak for this persistent cough will follow-up with PCP  Final Clinical Impressions(s) / ED Diagnoses   Final diagnoses:  Bronchitis    New  Prescriptions New Prescriptions   AZITHROMYCIN (ZITHROMAX Z-PAK) 250 MG TABLET    2 po day one, then 1 daily x 4 days     Milton Ferguson, MD 12/24/15 [phone]

## 2015-12-24 NOTE — Discharge Instructions (Signed)
Follow-up with your doctor next week if not improving °

## 2016-01-10 ENCOUNTER — Other Ambulatory Visit: Payer: Self-pay | Admitting: Family

## 2016-01-10 NOTE — Telephone Encounter (Signed)
Below Rx voided as previous UDS was negative for hydrocodone. See 12/09/15 phone note.  Spoke with pt again and advised him that PCP cannot prescribe any controlled medications going forward because this violated his controlled substance contract. Pt became upset. Advised pt he can continue to see current PCP but no controlled meds will be prescribed going forward and if he feels he needs these in the future he will need to find another Provider. Advised pt to check with his insurance for a list of in-network Providers in his area. Pt currently has appt on 02/17/16 with Inda Castle NP and said he will call us back and let us know what he wants to do but not to cancel appt at this time.

## 2016-01-10 NOTE — Telephone Encounter (Signed)
Last diazepam RF:  11/05/16, #30 Next OV 02/17/16 UDS:  11/08/15  Rx printed and forwarded to PCP for signature.

## 2016-01-12 DIAGNOSIS — G4733 Obstructive sleep apnea (adult) (pediatric): Secondary | ICD-10-CM | POA: Diagnosis not present

## 2016-01-24 DIAGNOSIS — E785 Hyperlipidemia, unspecified: Secondary | ICD-10-CM | POA: Diagnosis not present

## 2016-01-24 DIAGNOSIS — F330 Major depressive disorder, recurrent, mild: Secondary | ICD-10-CM | POA: Diagnosis not present

## 2016-01-24 DIAGNOSIS — I10 Essential (primary) hypertension: Secondary | ICD-10-CM | POA: Diagnosis not present

## 2016-01-24 DIAGNOSIS — E119 Type 2 diabetes mellitus without complications: Secondary | ICD-10-CM | POA: Diagnosis not present

## 2016-01-24 DIAGNOSIS — N400 Enlarged prostate without lower urinary tract symptoms: Secondary | ICD-10-CM | POA: Diagnosis not present

## 2016-01-24 DIAGNOSIS — G8929 Other chronic pain: Secondary | ICD-10-CM | POA: Diagnosis not present

## 2016-01-29 DIAGNOSIS — Z6834 Body mass index (BMI) 34.0-34.9, adult: Secondary | ICD-10-CM | POA: Diagnosis not present

## 2016-01-29 DIAGNOSIS — M545 Low back pain: Secondary | ICD-10-CM | POA: Diagnosis not present

## 2016-02-01 ENCOUNTER — Telehealth: Payer: Self-pay | Admitting: Family

## 2016-02-04 NOTE — Telephone Encounter (Signed)
shiquita-- please call pt and let him know that [facility] sent in a 30 day supply of his metformin and tamsulosin but he is due for a follow up on 02/13/16 if he is going to continue to see Melissa. Thanks!

## 2016-02-05 NOTE — Telephone Encounter (Signed)
Called pt. Made him aware that Rx's are at pharmacy for him and that he is also due a F/U. Pt says that he has a different provider.

## 2016-02-07 DIAGNOSIS — F330 Major depressive disorder, recurrent, mild: Secondary | ICD-10-CM | POA: Diagnosis not present

## 2016-02-07 DIAGNOSIS — Z0000 Encounter for general adult medical examination without abnormal findings: Secondary | ICD-10-CM | POA: Diagnosis not present

## 2016-02-07 DIAGNOSIS — G8929 Other chronic pain: Secondary | ICD-10-CM | POA: Diagnosis not present

## 2016-02-07 DIAGNOSIS — I10 Essential (primary) hypertension: Secondary | ICD-10-CM | POA: Diagnosis not present

## 2016-02-07 DIAGNOSIS — E785 Hyperlipidemia, unspecified: Secondary | ICD-10-CM | POA: Diagnosis not present

## 2016-02-07 DIAGNOSIS — E119 Type 2 diabetes mellitus without complications: Secondary | ICD-10-CM | POA: Diagnosis not present

## 2016-02-07 DIAGNOSIS — N400 Enlarged prostate without lower urinary tract symptoms: Secondary | ICD-10-CM | POA: Diagnosis not present

## 2016-02-12 DIAGNOSIS — G4733 Obstructive sleep apnea (adult) (pediatric): Secondary | ICD-10-CM | POA: Diagnosis not present

## 2016-02-17 ENCOUNTER — Ambulatory Visit: Payer: PPO | Admitting: Family

## 2016-02-21 DIAGNOSIS — G4733 Obstructive sleep apnea (adult) (pediatric): Secondary | ICD-10-CM | POA: Diagnosis not present

## 2016-03-02 DIAGNOSIS — G8929 Other chronic pain: Secondary | ICD-10-CM | POA: Diagnosis not present

## 2016-03-02 DIAGNOSIS — F330 Major depressive disorder, recurrent, mild: Secondary | ICD-10-CM | POA: Diagnosis not present

## 2016-03-02 DIAGNOSIS — E785 Hyperlipidemia, unspecified: Secondary | ICD-10-CM | POA: Diagnosis not present

## 2016-03-02 DIAGNOSIS — E119 Type 2 diabetes mellitus without complications: Secondary | ICD-10-CM | POA: Diagnosis not present

## 2016-03-02 DIAGNOSIS — N400 Enlarged prostate without lower urinary tract symptoms: Secondary | ICD-10-CM | POA: Diagnosis not present

## 2016-03-02 DIAGNOSIS — Z6835 Body mass index (BMI) 35.0-35.9, adult: Secondary | ICD-10-CM | POA: Diagnosis not present

## 2016-03-02 DIAGNOSIS — I10 Essential (primary) hypertension: Secondary | ICD-10-CM | POA: Diagnosis not present

## 2016-03-11 DIAGNOSIS — G4733 Obstructive sleep apnea (adult) (pediatric): Secondary | ICD-10-CM | POA: Diagnosis not present

## 2016-03-25 DIAGNOSIS — Z6836 Body mass index (BMI) 36.0-36.9, adult: Secondary | ICD-10-CM | POA: Diagnosis not present

## 2016-03-25 DIAGNOSIS — G8929 Other chronic pain: Secondary | ICD-10-CM | POA: Diagnosis not present

## 2016-03-25 DIAGNOSIS — F330 Major depressive disorder, recurrent, mild: Secondary | ICD-10-CM | POA: Diagnosis not present

## 2016-04-08 DIAGNOSIS — J060 Acute laryngopharyngitis: Secondary | ICD-10-CM | POA: Diagnosis not present

## 2016-04-08 DIAGNOSIS — K219 Gastro-esophageal reflux disease without esophagitis: Secondary | ICD-10-CM | POA: Diagnosis not present

## 2016-04-08 DIAGNOSIS — R0981 Nasal congestion: Secondary | ICD-10-CM | POA: Diagnosis not present

## 2016-04-11 DIAGNOSIS — G4733 Obstructive sleep apnea (adult) (pediatric): Secondary | ICD-10-CM | POA: Diagnosis not present

## 2016-05-04 DIAGNOSIS — I10 Essential (primary) hypertension: Secondary | ICD-10-CM | POA: Diagnosis not present

## 2016-05-04 DIAGNOSIS — E119 Type 2 diabetes mellitus without complications: Secondary | ICD-10-CM | POA: Diagnosis not present

## 2016-05-06 DIAGNOSIS — N400 Enlarged prostate without lower urinary tract symptoms: Secondary | ICD-10-CM | POA: Diagnosis not present

## 2016-05-06 DIAGNOSIS — I10 Essential (primary) hypertension: Secondary | ICD-10-CM | POA: Diagnosis not present

## 2016-05-06 DIAGNOSIS — G8929 Other chronic pain: Secondary | ICD-10-CM | POA: Diagnosis not present

## 2016-05-06 DIAGNOSIS — Z6836 Body mass index (BMI) 36.0-36.9, adult: Secondary | ICD-10-CM | POA: Diagnosis not present

## 2016-05-06 DIAGNOSIS — F330 Major depressive disorder, recurrent, mild: Secondary | ICD-10-CM | POA: Diagnosis not present

## 2016-05-06 DIAGNOSIS — E119 Type 2 diabetes mellitus without complications: Secondary | ICD-10-CM | POA: Diagnosis not present

## 2016-05-06 DIAGNOSIS — E785 Hyperlipidemia, unspecified: Secondary | ICD-10-CM | POA: Diagnosis not present

## 2016-05-11 DIAGNOSIS — G4733 Obstructive sleep apnea (adult) (pediatric): Secondary | ICD-10-CM | POA: Diagnosis not present

## 2016-05-14 ENCOUNTER — Emergency Department (HOSPITAL_COMMUNITY)
Admission: EM | Admit: 2016-05-14 | Discharge: 2016-05-14 | Disposition: A | Discharge status: Home / Self Care | Payer: PPO | Attending: Emergency Medicine | Admitting: Emergency Medicine

## 2016-05-14 ENCOUNTER — Emergency Department (HOSPITAL_COMMUNITY): Payer: PPO

## 2016-05-14 ENCOUNTER — Encounter (HOSPITAL_COMMUNITY): Payer: Self-pay | Admitting: Emergency Medicine

## 2016-05-14 DIAGNOSIS — Z7984 Long term (current) use of oral hypoglycemic drugs: Secondary | ICD-10-CM | POA: Diagnosis not present

## 2016-05-14 DIAGNOSIS — N200 Calculus of kidney: Secondary | ICD-10-CM | POA: Diagnosis not present

## 2016-05-14 DIAGNOSIS — R109 Unspecified abdominal pain: Secondary | ICD-10-CM | POA: Diagnosis not present

## 2016-05-14 DIAGNOSIS — I10 Essential (primary) hypertension: Secondary | ICD-10-CM | POA: Insufficient documentation

## 2016-05-14 DIAGNOSIS — Z87891 Personal history of nicotine dependence: Secondary | ICD-10-CM | POA: Diagnosis not present

## 2016-05-14 DIAGNOSIS — R1010 Upper abdominal pain, unspecified: Secondary | ICD-10-CM | POA: Diagnosis not present

## 2016-05-14 DIAGNOSIS — R1013 Epigastric pain: Secondary | ICD-10-CM | POA: Diagnosis not present

## 2016-05-14 DIAGNOSIS — Z79899 Other long term (current) drug therapy: Secondary | ICD-10-CM | POA: Diagnosis not present

## 2016-05-14 DIAGNOSIS — E119 Type 2 diabetes mellitus without complications: Secondary | ICD-10-CM | POA: Insufficient documentation

## 2016-05-14 DIAGNOSIS — Z7982 Long term (current) use of aspirin: Secondary | ICD-10-CM | POA: Insufficient documentation

## 2016-05-14 LAB — COMPREHENSIVE METABOLIC PANEL
ALBUMIN: 4.3 g/dL (ref 3.5–5.0)
ALK PHOS: 52 U/L (ref 38–126)
ALT: 23 U/L (ref 17–63)
AST: 23 U/L (ref 15–41)
Anion gap: 10 (ref 5–15)
BILIRUBIN TOTAL: 0.7 mg/dL (ref 0.3–1.2)
BUN: 41 mg/dL — AB (ref 6–20)
CALCIUM: 9.8 mg/dL (ref 8.9–10.3)
CO2: 27 mmol/L (ref 22–32)
CREATININE: 1.25 mg/dL — AB (ref 0.61–1.24)
Chloride: 101 mmol/L (ref 101–111)
GFR calc Af Amer: >60 mL/min (ref >60)
GFR calc non Af Amer: 54 mL/min — ABNORMAL LOW (ref >60)
GLUCOSE: 147 mg/dL — AB (ref 65–99)
Potassium: 3.9 mmol/L (ref 3.5–5.1)
SODIUM: 138 mmol/L (ref 135–145)
TOTAL PROTEIN: 7.9 g/dL (ref 6.5–8.1)

## 2016-05-14 LAB — CBC WITH DIFFERENTIAL/PLATELET
Basophils Absolute: 0.1 10*3/uL (ref 0.0–0.1)
Basophils Relative: 1 %
EOS ABS: 0.3 10*3/uL (ref 0.0–0.7)
EOS PCT: 3 %
HCT: 42 % (ref 39.0–52.0)
HEMOGLOBIN: 14.2 g/dL (ref 13.0–17.0)
LYMPHS ABS: 3.3 10*3/uL (ref 0.7–4.0)
Lymphocytes Relative: 31 %
MCH: 29.9 pg (ref 26.0–34.0)
MCHC: 33.8 g/dL (ref 30.0–36.0)
MCV: 88.4 fL (ref 78.0–100.0)
MONO ABS: 0.7 10*3/uL (ref 0.1–1.0)
MONOS PCT: 6 %
NEUTROS PCT: 59 %
Neutro Abs: 6.1 10*3/uL (ref 1.7–7.7)
Platelets: 250 10*3/uL (ref 150–400)
RBC: 4.75 MIL/uL (ref 4.22–5.81)
RDW: 12.8 % (ref 11.5–15.5)
WBC: 10.5 10*3/uL (ref 4.0–10.5)

## 2016-05-14 LAB — URINALYSIS, ROUTINE W REFLEX MICROSCOPIC
BILIRUBIN URINE: NEGATIVE
Bacteria, UA: NONE SEEN
Glucose, UA: NEGATIVE mg/dL
KETONES UR: NEGATIVE mg/dL
Leukocytes, UA: NEGATIVE
Nitrite: NEGATIVE
PH: 5 (ref 5.0–8.0)
Protein, ur: NEGATIVE mg/dL
Specific Gravity, Urine: 1.033 — ABNORMAL HIGH (ref 1.005–1.030)
Squamous Epithelial / LPF: NONE SEEN

## 2016-05-14 LAB — TROPONIN I: Troponin I: <0.03 ng/mL (ref <0.03)

## 2016-05-14 LAB — LIPASE, BLOOD: Lipase: 43 U/L (ref 11–51)

## 2016-05-14 MED ORDER — IOPAMIDOL (ISOVUE-300) INJECTION 61%
100.0000 mL | Freq: Once | INTRAVENOUS | Status: AC | PRN
  Administered 2016-05-14: 100 mL via INTRAVENOUS

## 2016-05-14 MED ORDER — PANTOPRAZOLE SODIUM 20 MG PO TBEC: 20.0000 mg | DELAYED_RELEASE_TABLET | Freq: Every day | ORAL | 0 refills | Status: DC

## 2016-05-14 MED ORDER — MORPHINE SULFATE (PF) 4 MG/ML IV SOLN
4.0000 mg | Freq: Once | INTRAVENOUS | Status: AC
  Administered 2016-05-14: 4 mg via INTRAVENOUS
  Filled 2016-05-14: qty 1

## 2016-05-14 MED ORDER — SODIUM CHLORIDE 0.9 % IV BOLUS (SEPSIS)
1000.0000 mL | Freq: Once | INTRAVENOUS | Status: AC
  Administered 2016-05-14: 1000 mL via INTRAVENOUS

## 2016-05-14 NOTE — ED Triage Notes (Signed)
Pt c/o generalized abd pain for months but has gotten worse over last couple of day. Pt denies any n/v/d.

## 2016-05-14 NOTE — ED Provider Notes (Signed)
Pt signed out by Dr. Regenia Skeeter awaiting results of CT scan.  Pt does have a large staghorn calculus on the right with mild to moderate hydronephrosis.  This is not c/w his pain as that is more epigastric and seems gastritis related.  Pt d/w Dr. Junious Silk (urology) who recommends follow up for the stone, but noting to do acutely.  Pt will be started on ppi for his pain and instr to f/u with urology and with pcp.  He may need GI also if sx do not resolve.   Isla Pence, MD 05/14/16 [phone]

## 2016-05-14 NOTE — ED Notes (Signed)
Patient transported to CT 

## 2016-05-14 NOTE — ED Provider Notes (Signed)
[facility] DEPT Provider Note   CSN: [id number] Arrival date & time: 05/14/16  0548     History   Chief Complaint Chief Complaint  Patient presents with  . Abdominal Pain    HPI Christian Morris is a 76 y.o. male.  HPI  76 year old male presents with abdominal pain. He states he's had abdominal pain for a couple months. It is premature daily. It seems to come and go. Specifically notices that eating seems to temporarily relieve the pain. The pain is mostly epigastric. There is no chest pain or shortness of breath currently. He had chest pain a couple days ago but none now. No nausea, vomiting, hematemesis, diarrhea, constipation, or melena. Maalox has temporarily helped the pain occasionally. Since last night and all night tonight the pain has been worse than it has been over these couple months. He had a steak last night but does not think this contributed. When he was 16 he had bleeding ulcers but has not had gastric problems since.   Past Medical History:  Diagnosis Date  . Anxiety   . Arthritis    "legs" (02/21/2014)  . Chronic lower back pain   . Daily headache    "here lately" (02/21/2014)  . Depression   . Fatty liver disease, nonalcoholic   . GERD (gastroesophageal reflux disease)   . History of blood transfusion ~ 1958   "related to bleeding ulcers"  . History of gastric ulcer ~ 1958   no recurrence since.  . Hyperlipidemia   . Hypertension   . Hypogonadism male 01/11/2011  . Kidney stones   . Lung nodule    right 16 mm, seen initially 6/12. 5mm in 08/2011.  . OSA on CPAP   . Squamous cell cancer of skin of forearm 01/23/2011   left  . Type II diabetes mellitus ([place])   . Urinary hesitancy   . Vitamin D deficiency 11/19/2014    Patient Active Problem List   Diagnosis Date Noted  . Memory loss 02/11/2015  . Vitamin D deficiency 11/19/2014  . Loss of weight 11/11/2014  . Hematuria 03/12/2014  . Frequent falls 11/14/2013  . Anemia 12/16/2012  . BPH (benign  prostatic hyperplasia) 10/28/2012  . Nephrolithiasis 10/28/2012  . Environmental allergies 02/01/2012  . Squamous cell carcinoma in situ of skin of forearm 01/23/2011  . Hypogonadism male 01/11/2011  . Plantar fasciitis 05/19/2010  . ERECTILE DYSFUNCTION, ORGANIC 12/31/2009  . COPD (chronic obstructive pulmonary disease) ([place]) 08/16/2009  . GERD 08/16/2009  . Hepatic steatosis 08/16/2009  . LOW BACK PAIN, CHRONIC 08/16/2009  . MICROALBUMINURIA 08/16/2009  . Diabetes type 2, controlled ([place]) 08/06/2009  . Hyperlipidemia 08/06/2009  . Obesity (BMI 30.0-34.9) 08/06/2009  . ANXIETY DEPRESSION 08/06/2009  . OSA (obstructive sleep apnea) 08/06/2009  . Essential hypertension 08/06/2009  . OSTEOARTHRITIS, KNEES, BILATERAL, MILD 08/06/2009  . PUD, HX OF 08/06/2009  . COLONIC POLYPS, HX OF 08/06/2009    Past Surgical History:  Procedure Laterality Date  . CARDIAC CATHETERIZATION    . CATARACT EXTRACTION W/ INTRAOCULAR LENS IMPLANT Left 08/2009   Dr Katy Fitch  . LEFT HEART CATHETERIZATION WITH CORONARY ANGIOGRAM N/A 11/17/2012   Procedure: LEFT HEART CATHETERIZATION WITH CORONARY ANGIOGRAM;  Surgeon: Burnell Blanks, MD;  Location: [facility] CATH LAB;  Service: Cardiovascular;  Laterality: N/A;  . MELANOMA EXCISION Left 01/23/2011   forearm       Home Medications    Prior to Admission medications   Medication Sig Start Date End Date Taking? Authorizing Provider  aspirin EC 81 MG tablet Take 81 mg by mouth every morning.    Yes [provider]  atorvastatin (LIPITOR) 40 MG tablet TAKE ONE TABLET BY MOUTH ONCE DAILY 08/13/15  Yes [name], NP  azithromycin (ZITHROMAX Z-PAK) 250 MG tablet 2 po day one, then 1 daily x 4 days 12/24/15  Yes Milton Ferguson, MD  diazepam (VALIUM) 10 MG tablet TAKE ONE TABLET BY MOUTH AT BEDTIME AS NEEDED FOR ANXIETY 01/10/16  Yes [name], NP  finasteride (PROSCAR) 5 MG tablet Take 1 tablet (5 mg total) by mouth daily. 12/05/15  Yes  [name], NP  fish oil-omega-3 fatty acids 1000 MG capsule Take 2 g by mouth 2 (two) times daily.     Yes [provider]  glucose blood (ONETOUCH VERIO) test strip Use to check blood sugar two times a day.  DX  E11.9 02/11/15  Yes [name], NP  hydrochlorothiazide (HYDRODIURIL) 25 MG tablet TAKE ONE TABLET BY MOUTH ONCE DAILY 08/13/15  Yes [name], NP  losartan (COZAAR) 25 MG tablet TAKE ONE TABLET BY MOUTH ONCE DAILY 11/21/15  Yes [name], NP  metFORMIN (GLUCOPHAGE) 500 MG tablet TAKE TWO TABLETS BY MOUTH TWICE DAILY WITH FOOD 02/04/16  Yes [name], NP  naproxen sodium (ALEVE) 220 MG tablet Take 220-440 mg by mouth daily as needed (for pain).   Yes [provider]  nitroGLYCERIN (NITROSTAT) 0.4 MG SL tablet Place 1 tablet (0.4 mg total) under the tongue every 5 (five) minutes x 3 doses as needed for chest pain (donot take with viagra). 04/17/14  Yes [name], NP  ONE TOUCH LANCETS MISC Use to check blood sugar twice a day.  DX E11.9 02/13/15  Yes [name], NP  PARoxetine (PAXIL) 30 MG tablet TAKE ONE TABLET BY MOUTH ONCE DAILY IN THE MORNING 08/13/15  Yes [name], NP  tamsulosin (FLOMAX) 0.4 MG CAPS capsule TAKE ONE CAPSULE BY MOUTH ONCE DAILY 02/04/16  Yes [name], NP  HYDROcodone-acetaminophen (NORCO) 7.5-325 MG tablet Take 1 tablet by mouth 3 (three) times daily as needed. Patient not taking: Reported on 12/24/2015 11/08/15   [name], NP    Family History Family History  Problem Relation Age of Onset  . Arthritis Mother   . Heart disease Sister        Massive MI age 68.  . Arrhythmia Brother   . Melanoma Son   . Heart attack Brother     Social History Social History  Substance Use Topics  . Smoking status: Former Smoker    Packs/day: 1.00    Years: 25.00    Types: Cigarettes    Quit date: 01/05/1982  . Smokeless tobacco: Never Used  . Alcohol use 0.0 oz/week       Comment: "quit drinking in the 1980's"     Allergies   Patient has no known allergies.   Review of Systems Review of Systems  Respiratory: Negative for shortness of breath.   Cardiovascular: Negative for chest pain.  Gastrointestinal: Positive for abdominal pain. Negative for blood in stool, constipation, diarrhea, nausea and vomiting.  Genitourinary: Negative for dysuria.  All other systems reviewed and are negative.    Physical Exam Updated Vital Signs BP 128/62   Pulse 91   Temp 98.2 F (36.8 C)   Resp (!) 22   Ht 6' (1.829 m)   Wt 265 lb (120.2 kg)   SpO2 97%   BMI 35.94 kg/m   Physical Exam  Constitutional: He  is oriented to person, place, and time. He appears well-developed and well-nourished.  HENT:  Head: Normocephalic and atraumatic.  Right Ear: External ear normal.  Left Ear: External ear normal.  Nose: Nose normal.  Eyes: Right eye exhibits no discharge. Left eye exhibits no discharge.  Neck: Neck supple.  Cardiovascular: Normal rate, regular rhythm and normal heart sounds.   Pulmonary/Chest: Effort normal and breath sounds normal.  Abdominal: Soft. There is tenderness.  Diffuse abdominal tenderness, worst in epigastrum  Musculoskeletal: He exhibits no edema.  Neurological: He is alert and oriented to person, place, and time.  Skin: Skin is warm and dry. He is not diaphoretic.  Nursing note and vitals reviewed.    ED Treatments / Results  Labs (all labs ordered are listed, but only abnormal results are displayed) Labs Reviewed  COMPREHENSIVE METABOLIC PANEL - Abnormal; Notable for the following:       Result Value   Glucose, Bld 147 (*)    BUN 41 (*)    Creatinine, Ser 1.25 (*)    GFR calc non Af Amer 54 (*)    All other components within normal limits  LIPASE, BLOOD  CBC WITH DIFFERENTIAL/PLATELET  TROPONIN I  URINALYSIS, ROUTINE W REFLEX MICROSCOPIC    EKG  EKG Interpretation  Date/Time:  Thursday May 14 2016 06:30:18  EDT Ventricular Rate:  56 PR Interval:    QRS Duration: 118 QT Interval:  462 QTC Calculation: 446 R Axis:   -3 Text Interpretation:  Sinus bradycardia Nonspecific intraventricular conduction delay Borderline T abnormalities, diffuse leads no significant change since dec 2017 Confirmed by Sherwood Gambler [phone]) on 05/14/2016 7:21:40 AM       Radiology No results found.  Procedures Procedures (including critical care time)  Medications Ordered in ED Medications  sodium chloride 0.9 % bolus 1,000 mL (not administered)  morphine 4 MG/ML injection 4 mg (not administered)     Initial Impression / Assessment and Plan / ED Course  I have reviewed the triage vital signs and the nursing notes.  Pertinent labs & imaging results that were available during my care of the patient were reviewed by me and considered in my medical decision making (see chart for details).     Patient was given IV morphine for pain. Given age, ECG, troponin sent but this appears mostly abdominal. Given chronicity, probably gastric with gastritis and/or ulcer. No signs of bleeding. Will get CT. Care to Dr. Gilford Raid with CT and disposition pending.  Final Clinical Impressions(s) / ED Diagnoses   Final diagnoses:  None    New Prescriptions New Prescriptions   No medications on file     Sherwood Gambler, MD 05/14/16 [phone]

## 2016-05-22 DIAGNOSIS — N200 Calculus of kidney: Secondary | ICD-10-CM | POA: Diagnosis not present

## 2016-05-22 DIAGNOSIS — K2900 Acute gastritis without bleeding: Secondary | ICD-10-CM | POA: Diagnosis not present

## 2016-06-05 DIAGNOSIS — Z0283 Encounter for blood-alcohol and blood-drug test: Secondary | ICD-10-CM | POA: Diagnosis not present

## 2016-06-11 DIAGNOSIS — G4733 Obstructive sleep apnea (adult) (pediatric): Secondary | ICD-10-CM | POA: Diagnosis not present

## 2016-06-16 ENCOUNTER — Telehealth: Payer: Self-pay | Admitting: Internal Medicine

## 2016-06-16 NOTE — Telephone Encounter (Signed)
I am happy to see him but won't be able to give him any controlled substances.

## 2016-06-16 NOTE — Telephone Encounter (Signed)
°  Relation to YW:VPXT Call back [phone]   Reason for call:  Patient would like to re establish with Melissa stating he's not happy with he's primary care, please adv

## 2016-06-17 NOTE — Telephone Encounter (Signed)
Patient informed of NP note patient scheduled 30 minute appointment for 07/14/16

## 2016-07-11 DIAGNOSIS — G4733 Obstructive sleep apnea (adult) (pediatric): Secondary | ICD-10-CM | POA: Diagnosis not present

## 2016-07-13 ENCOUNTER — Ambulatory Visit (INDEPENDENT_AMBULATORY_CARE_PROVIDER_SITE_OTHER): Payer: PPO | Admitting: Family

## 2016-07-13 ENCOUNTER — Encounter: Payer: Self-pay | Admitting: Family

## 2016-07-13 VITALS — BP 126/62 | HR 64 | Temp 98.2°F | Resp 20 | Ht 72.0 in | Wt 256.5 lb

## 2016-07-13 DIAGNOSIS — E119 Type 2 diabetes mellitus without complications: Secondary | ICD-10-CM

## 2016-07-13 DIAGNOSIS — R1013 Epigastric pain: Secondary | ICD-10-CM

## 2016-07-13 DIAGNOSIS — N401 Enlarged prostate with lower urinary tract symptoms: Secondary | ICD-10-CM | POA: Diagnosis not present

## 2016-07-13 DIAGNOSIS — G4733 Obstructive sleep apnea (adult) (pediatric): Secondary | ICD-10-CM

## 2016-07-13 DIAGNOSIS — E785 Hyperlipidemia, unspecified: Secondary | ICD-10-CM | POA: Diagnosis not present

## 2016-07-13 DIAGNOSIS — M545 Low back pain: Secondary | ICD-10-CM

## 2016-07-13 DIAGNOSIS — G8929 Other chronic pain: Secondary | ICD-10-CM

## 2016-07-13 DIAGNOSIS — F341 Dysthymic disorder: Secondary | ICD-10-CM | POA: Diagnosis not present

## 2016-07-13 LAB — CBC WITH DIFFERENTIAL/PLATELET
BASOS PCT: 0.8 % (ref 0.0–3.0)
Basophils Absolute: 0.1 10*3/uL (ref 0.0–0.1)
EOS ABS: 0.2 10*3/uL (ref 0.0–0.7)
EOS PCT: 2 % (ref 0.0–5.0)
HCT: 41.7 % (ref 39.0–52.0)
Hemoglobin: 13.9 g/dL (ref 13.0–17.0)
LYMPHS ABS: 2 10*3/uL (ref 0.7–4.0)
Lymphocytes Relative: 22 % (ref 12.0–46.0)
MCHC: 33.3 g/dL (ref 30.0–36.0)
MCV: 88.4 fl (ref 78.0–100.0)
MONO ABS: 0.6 10*3/uL (ref 0.1–1.0)
Monocytes Relative: 6.4 % (ref 3.0–12.0)
Neutro Abs: 6.3 10*3/uL (ref 1.4–7.7)
Neutrophils Relative %: 68.8 % (ref 43.0–77.0)
Platelets: 262 10*3/uL (ref 150.0–400.0)
RBC: 4.72 Mil/uL (ref 4.22–5.81)
RDW: 13.5 % (ref 11.5–15.5)
WBC: 9.1 10*3/uL (ref 4.0–10.5)

## 2016-07-13 LAB — LIPID PANEL
Cholesterol: 196 mg/dL (ref 0–200)
HDL: 38.3 mg/dL — ABNORMAL LOW (ref >39.00)
TRIGLYCERIDES: 406 mg/dL — AB (ref 0.0–149.0)
Total CHOL/HDL Ratio: 5

## 2016-07-13 LAB — BASIC METABOLIC PANEL
BUN: 28 mg/dL — ABNORMAL HIGH (ref 6–23)
CALCIUM: 9.9 mg/dL (ref 8.4–10.5)
CHLORIDE: 103 meq/L (ref 96–112)
CO2: 24 meq/L (ref 19–32)
Creatinine, Ser: 1.12 mg/dL (ref 0.40–1.50)
GFR: 67.42 mL/min (ref >60.00)
GLUCOSE: 191 mg/dL — AB (ref 70–99)
POTASSIUM: 3.8 meq/L (ref 3.5–5.1)
SODIUM: 136 meq/L (ref 135–145)

## 2016-07-13 LAB — HEMOGLOBIN A1C: Hgb A1c MFr Bld: 6.4 % (ref 4.6–6.5)

## 2016-07-13 LAB — HEPATIC FUNCTION PANEL
ALK PHOS: 50 U/L (ref 39–117)
ALT: 16 U/L (ref 0–53)
AST: 14 U/L (ref 0–37)
Albumin: 4.3 g/dL (ref 3.5–5.2)
BILIRUBIN DIRECT: 0 mg/dL (ref 0.0–0.3)
TOTAL PROTEIN: 7.3 g/dL (ref 6.0–8.3)
Total Bilirubin: 0.6 mg/dL (ref 0.2–1.2)

## 2016-07-13 LAB — LIPASE: Lipase: 53 U/L (ref 11.0–59.0)

## 2016-07-13 LAB — MICROALBUMIN / CREATININE URINE RATIO
CREATININE, U: 137.7 mg/dL
MICROALB UR: 18.6 mg/dL — AB (ref 0.0–1.9)
MICROALB/CREAT RATIO: 13.5 mg/g (ref 0.0–30.0)

## 2016-07-13 LAB — LDL CHOLESTEROL, DIRECT: LDL DIRECT: 103 mg/dL

## 2016-07-13 LAB — AMYLASE: AMYLASE: 39 U/L (ref 27–131)

## 2016-07-13 MED ORDER — PANTOPRAZOLE SODIUM 40 MG PO TBEC
40.0000 mg | DELAYED_RELEASE_TABLET | Freq: Every day | ORAL | 5 refills | Status: DC
Start: 2016-07-13 — End: 2016-08-14

## 2016-07-13 MED ORDER — TAMSULOSIN HCL 0.4 MG PO CAPS: 0.4000 mg | ORAL_CAPSULE | Freq: Every day | ORAL | 5 refills | Status: DC

## 2016-07-13 MED ORDER — METFORMIN HCL 500 MG PO TABS: ORAL_TABLET | ORAL | 5 refills | Status: DC

## 2016-07-13 MED ORDER — LISINOPRIL 5 MG PO TABS: 5.0000 mg | ORAL_TABLET | Freq: Every day | ORAL | 5 refills | Status: DC

## 2016-07-13 MED ORDER — ATORVASTATIN CALCIUM 40 MG PO TABS: 40.0000 mg | ORAL_TABLET | Freq: Every day | ORAL | 5 refills | Status: DC

## 2016-07-13 MED ORDER — PAROXETINE HCL 30 MG PO TABS: ORAL_TABLET | ORAL | 0 refills | Status: DC

## 2016-07-13 MED FILL — PARoxetine HCL 30 MG TABS: 30 | 90 days supply | Qty: 90 | Fill #0

## 2016-07-13 MED FILL — ATORVASTATIN 40 MG TABLET: 40 | 30 days supply | Qty: 30 | Fill #0

## 2016-07-13 MED FILL — PANTOPRAZOLE SOD DR 40 MG T: 40 | 30 days supply | Qty: 30 | Fill #0

## 2016-07-13 MED FILL — LISINOPRIL 5 MG TAB: 5 | 30 days supply | Qty: 30 | Fill #0

## 2016-07-13 NOTE — Patient Instructions (Addendum)
Please complete lab work prior to leaving. We will call you about scheduling your CT scan. Restart flomax for urine, protonix for stomach, lipitor for cholesterol and paxil for anxiety. Continue aspirin and metformin for diabetes. Complete stool kit and mail back at your earliest convenience.  Stop aleve, ok to continue tylenol as needed but do not take  than 3037m in 24 hours.

## 2016-07-13 NOTE — Assessment & Plan Note (Signed)
Not currently on narcotics. We discussed referral to pain clinic. He declines at this time.

## 2016-07-13 NOTE — Assessment & Plan Note (Addendum)
Check follow up A1C. Add lisinopril for renal protection. Cont ASA and metformin.

## 2016-07-13 NOTE — Assessment & Plan Note (Signed)
Uncontrolled, restart paxil.

## 2016-07-13 NOTE — Progress Notes (Signed)
Subjective:    Patient ID: Christian Morris, male    DOB: Dec 25, 1940, 76 y.o.   MRN: [id number]  HPI   Christian Morris is a 76 yr old male who presents today to re-establish care.  He has chief complaint is abdominal pain. He denies nausea or vomiting.  He reports that his BM's are normal.  Reports that he had black stools a few months ago. Denies BRBPR. Reports that his weight has been stable but he feels "like I am bloated all the time."   Has been present for several months.  Reports that he has had some neck pain and associated HA's for which he has been using aleve and tylenol.    Anxiety- previously on paxil/valium.  Reports that he has been waking up with nightmares. He has been off of paxil for a "good while." Reports that he may have some on/off issues with feeling of depression. Denies SI/HI.    GERD- previously on protonix. Reports that he has been off for a "long time."    DM2- maintained on metformin and aspirin 81mg .  Lab Results  Component Value Date   HGBA1C 6.3 11/13/2015   HGBA1C 6.5 08/14/2015   HGBA1C 6.6 (H) 05/13/2015   Lab Results  Component Value Date   MICROALBUR 11.4 (H) 08/17/2014   LDLCALC 66 08/17/2014   CREATININE 1.25 (H) 05/14/2016   Hyperlipidemia- previously on atorvastatin. Not currently taking.   Chronic low back pain-  Previously on norco.  Last fill was on 06/05/16 per review of controlled substance registry.   BPH- previously on proscar and flomax. HE reports occasional dysuria. Does have some difficulty with urination. Does not have nocturia.  Sometimes has a weak stream.   OSA- reports good compliance with his CPAP.        Review of Systems See HPI  Past Medical History:  Diagnosis Date  . Anxiety   . Arthritis    "legs" (02/21/2014)  . Chronic lower back pain   . Daily headache    "here lately" (02/21/2014)  . Depression   . Fatty liver disease, nonalcoholic   . GERD (gastroesophageal reflux disease)   . History of blood transfusion  ~ 1958   "related to bleeding ulcers"  . History of gastric ulcer ~ 1958   no recurrence since.  . Hyperlipidemia   . Hypertension   . Hypogonadism male 01/11/2011  . Kidney stones   . Lung nodule    right 16 mm, seen initially 6/12. 104mm in 08/2011.  . OSA on CPAP   . Squamous cell cancer of skin of forearm 01/23/2011   left  . Type II diabetes mellitus ([place])   . Urinary hesitancy   . Vitamin D deficiency 11/19/2014     Social History   Social History  . Marital status: Divorced    Spouse name: N/A  . Number of children: 3  . Years of education: N/A   Occupational History  . RETIRED BUS DRIVER Retired   Social History Main Topics  . Smoking status: Former Smoker    Packs/day: 1.00    Years: 25.00    Types: Cigarettes    Quit date: 01/05/1982  . Smokeless tobacco: Never Used  . Alcohol use 0.0 oz/week     Comment: "quit drinking in the 1980's"  . Drug use: No  . Sexual activity: Yes   Other Topics Concern  . 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    Past Surgical History:  Procedure Laterality Date  . CARDIAC CATHETERIZATION    . CATARACT EXTRACTION W/ INTRAOCULAR LENS IMPLANT Left 08/2009   Dr Katy Fitch  . LEFT HEART CATHETERIZATION WITH CORONARY ANGIOGRAM N/A 11/17/2012   Procedure: LEFT HEART CATHETERIZATION WITH CORONARY ANGIOGRAM;  Surgeon: Burnell Blanks, MD;  Location: [facility] CATH LAB;  Service: Cardiovascular;  Laterality: N/A;  . MELANOMA EXCISION Left 01/23/2011   forearm    Family History  Problem Relation Age of Onset  . Arthritis Mother   . Heart disease Sister        Massive MI age 58.  . Arrhythmia Brother   . Melanoma Son   . Heart attack Brother     No Known Allergies  Current Outpatient Prescriptions on File Prior to Visit  Medication Sig Dispense Refill  . aspirin EC 81 MG tablet Take 81 mg by mouth every morning.     . metFORMIN (GLUCOPHAGE) 500 MG  tablet TAKE TWO TABLETS BY MOUTH TWICE DAILY WITH FOOD 120 tablet 0  . atorvastatin (LIPITOR) 40 MG tablet TAKE ONE TABLET BY MOUTH ONCE DAILY (Patient not taking: Reported on 07/13/2016) 30 tablet 5  . diazepam (VALIUM) 10 MG tablet TAKE ONE TABLET BY MOUTH AT BEDTIME AS NEEDED FOR ANXIETY (Patient not taking: Reported on 07/13/2016) 30 tablet 0  . finasteride (PROSCAR) 5 MG tablet Take 1 tablet (5 mg total) by mouth daily. (Patient not taking: Reported on 07/13/2016) 30 tablet 5  . fish oil-omega-3 fatty acids 1000 MG capsule Take 2 g by mouth 2 (two) times daily.      [name] glucose blood (ONETOUCH VERIO) test strip Use to check blood sugar two times a day.  DX  E11.9 (Patient not taking: Reported on 07/13/2016) 100 each 5  . HYDROcodone-acetaminophen (NORCO) 7.5-325 MG tablet Take 1 tablet by mouth 3 (three) times daily as needed. (Patient not taking: Reported on 07/13/2016) 90 tablet 0  . losartan (COZAAR) 25 MG tablet TAKE ONE TABLET BY MOUTH ONCE DAILY (Patient not taking: Reported on 07/13/2016) 90 tablet 0  . losartan-hydrochlorothiazide (HYZAAR) 50-12.5 MG tablet Take 1 tablet by mouth daily.    . nitroGLYCERIN (NITROSTAT) 0.4 MG SL tablet Place 1 tablet (0.4 mg total) under the tongue every 5 (five) minutes x 3 doses as needed for chest pain (donot take with viagra). (Patient not taking: Reported on 07/13/2016) 60 tablet 12  . ONE TOUCH LANCETS MISC Use to check blood sugar twice a day.  DX E11.9 (Patient not taking: Reported on 07/13/2016) 100 each 5  . PARoxetine (PAXIL) 30 MG tablet TAKE ONE TABLET BY MOUTH ONCE DAILY IN THE MORNING (Patient not taking: Reported on 07/13/2016) 30 tablet 5  . tamsulosin (FLOMAX) 0.4 MG CAPS capsule TAKE ONE CAPSULE BY MOUTH ONCE DAILY (Patient not taking: Reported on 07/13/2016) 30 capsule 0   No current facility-administered medications on file prior to visit.     BP 126/62 (BP Location: Right Arm, Cuff Size: Large)   Pulse 64   Temp 98.2 F (36.8 C) (Oral)   Resp 20   Ht  6' (1.829 m)   Wt 256 lb 8 oz (116.3 kg)   SpO2 97%   BMI 34.79 kg/m       Objective:   Physical Exam  Constitutional: He is oriented to person, place, and time. He appears well-developed and well-nourished. No distress.  HENT:  Head: Normocephalic and atraumatic.  Cardiovascular: Normal rate and regular rhythm.   No murmur heard. Pulmonary/Chest: Effort normal and breath sounds normal. No respiratory distress. He has no wheezes. He has no rales.  Musculoskeletal: He exhibits no edema.  Neurological: He is alert and oriented to person, place, and time.  Skin: Skin is warm and dry.  Psychiatric: He has a normal mood and affect. His behavior is normal. Thought content normal.          Assessment & Plan:  Abdominal pain- PUD is a possibility- d/c aleve, check cbc, IFOB, H pylori breath test. Start PPI. Will check baseline Cr and then plan to have him complete CT abd/pelvis tomorrow.  If work up unrevealing and symptoms fail to improve will need referral to GI.

## 2016-07-13 NOTE — Assessment & Plan Note (Signed)
Uncontrolled, restart flomax.

## 2016-07-13 NOTE — Assessment & Plan Note (Signed)
Check lipid panel, restart statin.

## 2016-07-13 NOTE — Assessment & Plan Note (Signed)
Stable, continue CPAP

## 2016-07-14 ENCOUNTER — Telehealth: Payer: Self-pay | Admitting: *Deleted

## 2016-07-14 ENCOUNTER — Ambulatory Visit: Payer: PPO | Admitting: Family

## 2016-07-14 ENCOUNTER — Ambulatory Visit (HOSPITAL_BASED_OUTPATIENT_CLINIC_OR_DEPARTMENT_OTHER)
Admission: RE | Admit: 2016-07-14 | Discharge: 2016-07-14 | Disposition: A | Discharge status: Home / Self Care | Payer: PPO | Source: Ambulatory Visit | Attending: Family | Admitting: Family

## 2016-07-14 DIAGNOSIS — R350 Frequency of micturition: Secondary | ICD-10-CM

## 2016-07-14 DIAGNOSIS — M5137 Other intervertebral disc degeneration, lumbosacral region: Secondary | ICD-10-CM | POA: Diagnosis not present

## 2016-07-14 DIAGNOSIS — I313 Pericardial effusion (noninflammatory): Secondary | ICD-10-CM | POA: Diagnosis not present

## 2016-07-14 DIAGNOSIS — R1013 Epigastric pain: Secondary | ICD-10-CM | POA: Diagnosis not present

## 2016-07-14 DIAGNOSIS — I700 Atherosclerosis of aorta: Secondary | ICD-10-CM | POA: Diagnosis not present

## 2016-07-14 DIAGNOSIS — N200 Calculus of kidney: Secondary | ICD-10-CM | POA: Diagnosis not present

## 2016-07-14 LAB — H. PYLORI BREATH TEST: H. PYLORI BREATH TEST: DETECTED — AB

## 2016-07-14 MED ORDER — IOPAMIDOL (ISOVUE-300) INJECTION 61%
100.0000 mL | Freq: Once | INTRAVENOUS | Status: AC | PRN
  Administered 2016-07-14: 100 mL via INTRAVENOUS

## 2016-07-14 NOTE — Telephone Encounter (Signed)
Lab unable to add u/a. Per verbal from PCP, also needs to order urine culture. Pt in the office for a CT scan today so he came in and gave [place] a urine specimen to order tests. Orders entered.

## 2016-07-14 NOTE — Telephone Encounter (Signed)
-----   Message from [name], NP sent at 07/14/2016 10:13 AM EDT ----- Could you please see if the lab can add on a urinalysis with micro-, diagnosis urinary frequency? tks

## 2016-07-15 ENCOUNTER — Telehealth: Payer: Self-pay | Admitting: Family

## 2016-07-15 MED ORDER — BIS SUBCIT-METRONID-TETRACYC 140-125-125 MG PO CAPS: 3.0000 | ORAL_CAPSULE | Freq: Three times a day (TID) | ORAL | 0 refills | Status: DC

## 2016-07-15 MED ORDER — BIS SUBCIT-METRONID-TETRACYC 140-125-125 MG PO CAPS
3.0000 | ORAL_CAPSULE | Freq: Three times a day (TID) | ORAL | 0 refills | Status: DC
Start: 2016-07-15 — End: 2016-07-16

## 2016-07-15 NOTE — Telephone Encounter (Signed)
CT scan does not show any worrisome findings in his abdomen. Lab work does show that he has H. Pylori which can cause ulcers/pain.   Increase protonix to bid for 14 days, and start pylera. Call if symptoms worsen or if symptoms fail to improve. Cholesterol/triglycerides high, restart lipitor. Call if new/worsening abdominal pain or if not improved in 2 weeks. Follow up in august as scheduled.

## 2016-07-15 NOTE — Telephone Encounter (Signed)
Notified pt and he voices understanding. Pylera Rx went to [facility] in [place] and pt states he is using [facility] in [place]. Called [facility] and cancelled Rx, re-sent to [facility] in [place].

## 2016-07-16 ENCOUNTER — Telehealth: Payer: Self-pay | Admitting: Family

## 2016-07-16 ENCOUNTER — Telehealth: Payer: Self-pay

## 2016-07-16 MED ORDER — METRONIDAZOLE 500 MG PO TABS: 500.0000 mg | ORAL_TABLET | Freq: Three times a day (TID) | ORAL | 0 refills | Status: AC

## 2016-07-16 MED ORDER — TETRACYCLINE HCL 500 MG PO CAPS: 500.0000 mg | ORAL_CAPSULE | Freq: Four times a day (QID) | ORAL | 0 refills | Status: AC

## 2016-07-16 MED ORDER — BISMUTH SUBSALICYLATE 262 MG PO TABS: 2.0000 | ORAL_TABLET | Freq: Four times a day (QID) | ORAL | 0 refills | Status: AC

## 2016-07-16 NOTE — Addendum Note (Signed)
Addended by: [name] on: 07/16/2016 04:20 PM   Modules accepted: Orders

## 2016-07-16 NOTE — Telephone Encounter (Signed)
Notified pt and he voices understanding. Notified Conservation officer, historic buildings at [employer].

## 2016-07-16 NOTE — Telephone Encounter (Signed)
Christian Morris [phone] Called in because he said that a Rx for [facility] was sent over to him. He said that the Rx quantity should be for 120 for its full course. He would like a call back or an updated Rx sent over to him.

## 2016-07-16 NOTE — Telephone Encounter (Signed)
Melissa-- please advise? 

## 2016-07-16 NOTE — Telephone Encounter (Signed)
Caller name: [place]  Can be reached: [phone]  Reason for call: Pt does not want Pylera due to copay $85. Would like something else prescribed.

## 2016-07-16 NOTE — Telephone Encounter (Signed)
[place] called back in to follow up.   Advised per PCP okay to change quantity to 120 and keep instruction the same.

## 2016-07-16 NOTE — Telephone Encounter (Signed)
PA initiated via Covermymeds; KEY: [id number]. Awaiting determination.

## 2016-07-16 NOTE — Telephone Encounter (Signed)
In place of Pylera I have sent in prescriptions for tetracycline, metronidazole, and bismuth subsalicylate. In addition to these medications he should take the Protonix 40 mg twice daily for the next 2 weeks. He should not drink alcohol while taking metronidazole.

## 2016-07-16 NOTE — Telephone Encounter (Signed)
Yes, please change quantity to 120.

## 2016-07-17 NOTE — Telephone Encounter (Signed)
PA approved through 01/04/2017.  

## 2016-07-20 ENCOUNTER — Other Ambulatory Visit (HOSPITAL_BASED_OUTPATIENT_CLINIC_OR_DEPARTMENT_OTHER): Payer: PPO

## 2016-08-10 ENCOUNTER — Ambulatory Visit: Payer: PPO | Admitting: Family

## 2016-08-10 DIAGNOSIS — Z0289 Encounter for other administrative examinations: Secondary | ICD-10-CM

## 2016-08-11 DIAGNOSIS — G4733 Obstructive sleep apnea (adult) (pediatric): Secondary | ICD-10-CM | POA: Diagnosis not present

## 2016-08-13 ENCOUNTER — Telehealth: Payer: Self-pay | Admitting: Family

## 2016-08-13 NOTE — Telephone Encounter (Signed)
Pt called in, he rescheduled his Monday apt to 08/14/16 at 2:00.

## 2016-08-14 ENCOUNTER — Encounter: Payer: Self-pay | Admitting: Family

## 2016-08-14 ENCOUNTER — Ambulatory Visit (INDEPENDENT_AMBULATORY_CARE_PROVIDER_SITE_OTHER): Payer: PPO | Admitting: Family

## 2016-08-14 VITALS — BP 117/83 | HR 83 | Temp 98.1°F | Resp 18 | Ht 72.0 in | Wt 260.6 lb

## 2016-08-14 DIAGNOSIS — A048 Other specified bacterial intestinal infections: Secondary | ICD-10-CM | POA: Diagnosis not present

## 2016-08-14 DIAGNOSIS — K5900 Constipation, unspecified: Secondary | ICD-10-CM | POA: Diagnosis not present

## 2016-08-14 DIAGNOSIS — F419 Anxiety disorder, unspecified: Secondary | ICD-10-CM

## 2016-08-14 DIAGNOSIS — N401 Enlarged prostate with lower urinary tract symptoms: Secondary | ICD-10-CM

## 2016-08-14 DIAGNOSIS — K279 Peptic ulcer, site unspecified, unspecified as acute or chronic, without hemorrhage or perforation: Secondary | ICD-10-CM

## 2016-08-14 DIAGNOSIS — B9681 Helicobacter pylori [H. pylori] as the cause of diseases classified elsewhere: Secondary | ICD-10-CM

## 2016-08-14 MED ORDER — PANTOPRAZOLE SODIUM 40 MG PO TBEC
40.0000 mg | DELAYED_RELEASE_TABLET | Freq: Every day | ORAL | 5 refills | Status: DC
Start: 2016-08-14 — End: 2016-11-16

## 2016-08-14 MED ORDER — PAROXETINE HCL 30 MG PO TABS: ORAL_TABLET | ORAL | 5 refills | Status: DC

## 2016-08-14 MED ORDER — ATORVASTATIN CALCIUM 40 MG PO TABS: 40.0000 mg | ORAL_TABLET | Freq: Every day | ORAL | 5 refills | Status: DC

## 2016-08-14 MED ORDER — LISINOPRIL 5 MG PO TABS: 5.0000 mg | ORAL_TABLET | Freq: Every day | ORAL | 5 refills | Status: AC

## 2016-08-14 MED ORDER — METFORMIN HCL 500 MG PO TABS: ORAL_TABLET | ORAL | 5 refills | Status: DC

## 2016-08-14 NOTE — Progress Notes (Signed)
Subjective:    Patient ID: Christian Morris, male    DOB: 01/25/1940, 76 y.o.   MRN: [id number]  HPI   Patient is a 76 year old male who presents today for follow-up.  Anxiety/depression-patient is maintained on Paxil.reports anxiety is well controlled. Mood is good.   BPH-reports some hesitency.  Also notes occasional constipation. Reports that he drinks a lot of water but not eating many fruits/veggies.    H. Pylori/epigastric pain-    Last visit he tested + for H pylori and was treated.  Reports resolution of epigastric pain.      Review of Systems    see HPI  Past Medical History:  Diagnosis Date  . Anxiety   . Arthritis    "legs" (02/21/2014)  . Chronic lower back pain   . Daily headache    "here lately" (02/21/2014)  . Depression   . Fatty liver disease, nonalcoholic   . GERD (gastroesophageal reflux disease)   . History of blood transfusion ~ 1958   "related to bleeding ulcers"  . History of gastric ulcer ~ 1958   no recurrence since.  . Hyperlipidemia   . Hypertension   . Hypogonadism male 01/11/2011  . Kidney stones   . Lung nodule    right 16 mm, seen initially 6/12. 56mm in 08/2011.  . OSA on CPAP   . Squamous cell cancer of skin of forearm 01/23/2011   left  . Type II diabetes mellitus ([place])   . Urinary hesitancy   . Vitamin D deficiency 11/19/2014     Social History   Social History  . Marital status: Divorced    Spouse name: N/A  . Number of children: 3  . Years of education: N/A   Occupational History  . RETIRED BUS DRIVER Retired   Social History Main Topics  . Smoking status: Former Smoker    Packs/day: 1.00    Years: 25.00    Types: Cigarettes    Quit date: 01/05/1982  . Smokeless tobacco: Never Used  . Alcohol use 0.0 oz/week     Comment: "quit drinking in the 1980's"  . Drug use: No  . Sexual activity: Yes   Other Topics Concern  . 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    Past Surgical History:  Procedure Laterality Date  . CARDIAC CATHETERIZATION    . CATARACT EXTRACTION W/ INTRAOCULAR LENS IMPLANT Left 08/2009   Dr Katy Fitch  . LEFT HEART CATHETERIZATION WITH CORONARY ANGIOGRAM N/A 11/17/2012   Procedure: LEFT HEART CATHETERIZATION WITH CORONARY ANGIOGRAM;  Surgeon: Burnell Blanks, MD;  Location: [facility] CATH LAB;  Service: Cardiovascular;  Laterality: N/A;  . MELANOMA EXCISION Left 01/23/2011   forearm    Family History  Problem Relation Age of Onset  . Arthritis Mother   . Heart disease Sister        Massive MI age 60.  . Arrhythmia Brother   . Melanoma Son   . Heart attack Brother     No Known Allergies  Current Outpatient Prescriptions on File Prior to Visit  Medication Sig Dispense Refill  . aspirin EC 81 MG tablet Take 81 mg by mouth every morning.     . fish oil-omega-3 fatty acids 1000 MG capsule Take 2 g by mouth 2 (two) times daily.      [name] glucose blood (ONETOUCH VERIO) test strip Use to check blood  sugar two times a day.  DX  E11.9 (Patient not taking: Reported on 07/13/2016) 100 each 5  . nitroGLYCERIN (NITROSTAT) 0.4 MG SL tablet Place 1 tablet (0.4 mg total) under the tongue every 5 (five) minutes x 3 doses as needed for chest pain (donot take with viagra). (Patient not taking: Reported on 07/13/2016) 60 tablet 12  . ONE TOUCH LANCETS MISC Use to check blood sugar twice a day.  DX E11.9 (Patient not taking: Reported on 07/13/2016) 100 each 5  . tamsulosin (FLOMAX) 0.4 MG CAPS capsule Take 1 capsule (0.4 mg total) by mouth daily. 30 capsule 5   No current facility-administered medications on file prior to visit.     BP 117/83 (BP Location: Right Arm, Cuff Size: Normal)   Pulse 83   Temp 98.1 F (36.7 C) (Oral)   Resp 18   Ht 6' (1.829 m)   Wt 260 lb 9.6 oz (118.2 kg)   SpO2 97%   BMI 35.34 kg/m    Objective:   Physical Exam  Constitutional: He is oriented to person, place, and time. He appears  well-developed and well-nourished. No distress.  HENT:  Head: Normocephalic and atraumatic.  Cardiovascular: Normal rate and regular rhythm.   No murmur heard. Pulmonary/Chest: Effort normal and breath sounds normal. No respiratory distress. He has no wheezes. He has no rales.  Musculoskeletal: He exhibits no edema.  Neurological: He is alert and oriented to person, place, and time.  Skin: Skin is warm and dry.  Psychiatric: He has a normal mood and affect. His behavior is normal. Thought content normal.          Assessment & Plan:  H pylori- clinically resolved following treatment.    Anxiety- stable on paxil, continue same.   BPH- uncontrolled. Restart flomax.  Constipation- discussed adding more fruits/veggies and fiber cereal.

## 2016-08-14 NOTE — Patient Instructions (Signed)
Restart all medications.

## 2016-08-14 NOTE — Telephone Encounter (Signed)
Noted  

## 2016-08-16 ENCOUNTER — Emergency Department (HOSPITAL_COMMUNITY)
Admission: EM | Admit: 2016-08-16 | Discharge: 2016-08-16 | Disposition: A | Discharge status: Home / Self Care | Payer: PPO | Attending: Emergency Medicine | Admitting: Emergency Medicine

## 2016-08-16 ENCOUNTER — Emergency Department (HOSPITAL_COMMUNITY): Payer: PPO

## 2016-08-16 ENCOUNTER — Encounter (HOSPITAL_COMMUNITY): Payer: Self-pay | Admitting: Emergency Medicine

## 2016-08-16 DIAGNOSIS — Z79899 Other long term (current) drug therapy: Secondary | ICD-10-CM | POA: Insufficient documentation

## 2016-08-16 DIAGNOSIS — J449 Chronic obstructive pulmonary disease, unspecified: Secondary | ICD-10-CM | POA: Insufficient documentation

## 2016-08-16 DIAGNOSIS — N23 Unspecified renal colic: Secondary | ICD-10-CM | POA: Diagnosis not present

## 2016-08-16 DIAGNOSIS — E119 Type 2 diabetes mellitus without complications: Secondary | ICD-10-CM | POA: Diagnosis not present

## 2016-08-16 DIAGNOSIS — I10 Essential (primary) hypertension: Secondary | ICD-10-CM | POA: Insufficient documentation

## 2016-08-16 DIAGNOSIS — R1083 Colic: Secondary | ICD-10-CM | POA: Diagnosis not present

## 2016-08-16 DIAGNOSIS — R109 Unspecified abdominal pain: Secondary | ICD-10-CM | POA: Diagnosis not present

## 2016-08-16 DIAGNOSIS — Z87891 Personal history of nicotine dependence: Secondary | ICD-10-CM | POA: Diagnosis not present

## 2016-08-16 DIAGNOSIS — J9811 Atelectasis: Secondary | ICD-10-CM | POA: Diagnosis not present

## 2016-08-16 DIAGNOSIS — N201 Calculus of ureter: Secondary | ICD-10-CM | POA: Insufficient documentation

## 2016-08-16 DIAGNOSIS — Z85828 Personal history of other malignant neoplasm of skin: Secondary | ICD-10-CM | POA: Insufficient documentation

## 2016-08-16 DIAGNOSIS — N132 Hydronephrosis with renal and ureteral calculous obstruction: Secondary | ICD-10-CM | POA: Diagnosis not present

## 2016-08-16 DIAGNOSIS — Z7984 Long term (current) use of oral hypoglycemic drugs: Secondary | ICD-10-CM | POA: Diagnosis not present

## 2016-08-16 LAB — DIFFERENTIAL
BASOS PCT: 1 %
Basophils Absolute: 0.1 10*3/uL (ref 0.0–0.1)
EOS ABS: 0.2 10*3/uL (ref 0.0–0.7)
EOS PCT: 1 %
Lymphocytes Relative: 19 %
Lymphs Abs: 2.4 10*3/uL (ref 0.7–4.0)
MONO ABS: 1.1 10*3/uL — AB (ref 0.1–1.0)
MONOS PCT: 9 %
NEUTROS ABS: 8.9 10*3/uL — AB (ref 1.7–7.7)
Neutrophils Relative %: 70 %

## 2016-08-16 LAB — URINALYSIS, ROUTINE W REFLEX MICROSCOPIC
BACTERIA UA: NONE SEEN
BILIRUBIN URINE: NEGATIVE
Glucose, UA: NEGATIVE mg/dL
KETONES UR: NEGATIVE mg/dL
Leukocytes, UA: NEGATIVE
Nitrite: NEGATIVE
PROTEIN: NEGATIVE mg/dL
SPECIFIC GRAVITY, URINE: 1.014 (ref 1.005–1.030)
pH: 5 (ref 5.0–8.0)

## 2016-08-16 LAB — COMPREHENSIVE METABOLIC PANEL
ALBUMIN: 4.2 g/dL (ref 3.5–5.0)
ALT: 17 U/L (ref 17–63)
ANION GAP: 11 (ref 5–15)
AST: 22 U/L (ref 15–41)
Alkaline Phosphatase: 58 U/L (ref 38–126)
BILIRUBIN TOTAL: 1 mg/dL (ref 0.3–1.2)
BUN: 29 mg/dL — ABNORMAL HIGH (ref 6–20)
CHLORIDE: 103 mmol/L (ref 101–111)
CO2: 26 mmol/L (ref 22–32)
Calcium: 10.1 mg/dL (ref 8.9–10.3)
Creatinine, Ser: 1.32 mg/dL — ABNORMAL HIGH (ref 0.61–1.24)
GFR calc Af Amer: 58 mL/min — ABNORMAL LOW (ref >60)
GFR calc non Af Amer: 50 mL/min — ABNORMAL LOW (ref >60)
GLUCOSE: 133 mg/dL — AB (ref 65–99)
POTASSIUM: 4.2 mmol/L (ref 3.5–5.1)
SODIUM: 140 mmol/L (ref 135–145)
Total Protein: 7.3 g/dL (ref 6.5–8.1)

## 2016-08-16 LAB — CBC
HEMATOCRIT: 42.5 % (ref 39.0–52.0)
HEMOGLOBIN: 14.1 g/dL (ref 13.0–17.0)
MCH: 29.3 pg (ref 26.0–34.0)
MCHC: 33.2 g/dL (ref 30.0–36.0)
MCV: 88.2 fL (ref 78.0–100.0)
Platelets: 230 10*3/uL (ref 150–400)
RBC: 4.82 MIL/uL (ref 4.22–5.81)
RDW: 13.1 % (ref 11.5–15.5)
WBC: 12.7 10*3/uL — ABNORMAL HIGH (ref 4.0–10.5)

## 2016-08-16 LAB — TROPONIN I

## 2016-08-16 LAB — LIPASE, BLOOD: LIPASE: 46 U/L (ref 11–51)

## 2016-08-16 MED ORDER — SODIUM CHLORIDE 0.9 % IV BOLUS (SEPSIS)
250.0000 mL | Freq: Once | INTRAVENOUS | Status: AC
  Administered 2016-08-16: 250 mL via INTRAVENOUS

## 2016-08-16 MED ORDER — ONDANSETRON HCL 4 MG/2ML IJ SOLN
4.0000 mg | INTRAMUSCULAR | Status: DC | PRN
  Administered 2016-08-16: 4 mg via INTRAVENOUS
  Filled 2016-08-16: qty 2

## 2016-08-16 MED ORDER — ONDANSETRON HCL 4 MG PO TABS: 4.0000 mg | ORAL_TABLET | Freq: Three times a day (TID) | ORAL | 0 refills | Status: DC | PRN

## 2016-08-16 MED ORDER — MORPHINE SULFATE (PF) 4 MG/ML IV SOLN
4.0000 mg | Freq: Once | INTRAVENOUS | Status: DC
Start: 2016-08-16 — End: 2016-08-16
  Filled 2016-08-16: qty 1

## 2016-08-16 MED ORDER — OXYCODONE-ACETAMINOPHEN 5-325 MG PO TABS: 2.0000 | ORAL_TABLET | Freq: Four times a day (QID) | ORAL | 0 refills | Status: DC | PRN

## 2016-08-16 MED ORDER — MORPHINE SULFATE (PF) 4 MG/ML IV SOLN
4.0000 mg | Freq: Once | INTRAVENOUS | Status: AC
  Administered 2016-08-16: 4 mg via INTRAMUSCULAR

## 2016-08-16 MED ORDER — IOPAMIDOL (ISOVUE-300) INJECTION 61%
100.0000 mL | Freq: Once | INTRAVENOUS | Status: AC | PRN
  Administered 2016-08-16: 100 mL via INTRAVENOUS

## 2016-08-16 MED ORDER — OXYCODONE-ACETAMINOPHEN 5-325 MG PO TABS: ORAL_TABLET | ORAL | 0 refills | Status: DC

## 2016-08-16 MED ORDER — SODIUM CHLORIDE 0.9 % IV SOLN
INTRAVENOUS | Status: DC
  Administered 2016-08-16: 11:00:00 via INTRAVENOUS

## 2016-08-16 MED ORDER — FAMOTIDINE IN NACL 20-0.9 MG/50ML-% IV SOLN
20.0000 mg | Freq: Once | INTRAVENOUS | Status: AC
  Administered 2016-08-16: 20 mg via INTRAVENOUS
  Filled 2016-08-16: qty 50

## 2016-08-16 MED ORDER — MORPHINE SULFATE (PF) 4 MG/ML IV SOLN
4.0000 mg | INTRAVENOUS | Status: AC | PRN
  Administered 2016-08-16 (×2): 4 mg via INTRAVENOUS
  Filled 2016-08-16 (×2): qty 1

## 2016-08-16 NOTE — ED Notes (Signed)
Patient seen by writer getting into car and backing out. Writer asked security to try and talk with patient to get him to not leave. Writer called [employer]-com and spoke with [employer]. States he will send officer to attempt to locate patient.

## 2016-08-16 NOTE — ED Notes (Signed)
Patient took Iv out, verbal order given from Dr. Thurnell Garbe that patient cane have Morphine IM.

## 2016-08-16 NOTE — ED Notes (Signed)
Patient in CT at this time.

## 2016-08-16 NOTE — Discharge Instructions (Signed)
Take the prescriptions as directed.  Call the Urologist tomorrow to schedule a follow up appointment for this Wednesday.  Return to the Emergency Department immediately if worsening.

## 2016-08-16 NOTE — ED Notes (Signed)
PAtient wanting to leave. Advised patient MD is working on paper work and prescriptions for d/c. Patient states he is not going to wait long. Writer encouraged patient to stay. Patient pleasant and states he will wait a little longer.

## 2016-08-16 NOTE — ED Triage Notes (Signed)
Pt reports abd pain that started in epigastric region but is now on left side of abdomen. Moderate distention noted with dyspnea upon exertion. Pt denies nausea, vomiting, diarrhea. Pt reports has not had a BM x1 week.

## 2016-08-16 NOTE — ED Notes (Signed)
Patient told by writer he was not able to drive home due to pain medication given. Patient stated to writer he was ok to drive. Writer informed patient he needed to call ride that he can not legally drive within 4 hours of medication given. Patient stated to writer he would wait in lobby and call ride. Security call to inform that patient should not drive, description given to security.

## 2016-08-16 NOTE — ED Provider Notes (Signed)
[facility] DEPT Provider Note   CSN: [id number] Arrival date & time: 08/16/16  0905     History   Chief Complaint Chief Complaint  Patient presents with  . Abdominal Pain    HPI Christian Morris is a 76 y.o. male.  HPI  Pt was seen at 0925.  Per pt, c/o gradual onset and persistence of constant generalized abd "pain" for the past several months, worse over the past 2 days. States the pain was generalized, now located more on the left side of his abd.   Has been associated with "not having a BM x1 week."  Describes the abd pain as "bloating" that makes him feel SOB.  Denies N/V, no diarrhea, no fevers, no back pain, no rash, no CP/palpitations, no cough, no black or blood in stools.      Past Medical History:  Diagnosis Date  . Anxiety   . Arthritis    "legs" (02/21/2014)  . Chronic lower back pain   . Daily headache    "here lately" (02/21/2014)  . Depression   . Fatty liver disease, nonalcoholic   . GERD (gastroesophageal reflux disease)   . History of blood transfusion ~ 1958   "related to bleeding ulcers"  . History of gastric ulcer ~ 1958   no recurrence since.  . Hyperlipidemia   . Hypertension   . Hypogonadism male 01/11/2011  . Kidney stones   . Lung nodule    right 16 mm, seen initially 6/12. 69mm in 08/2011.  . OSA on CPAP   . Squamous cell cancer of skin of forearm 01/23/2011   left  . Type II diabetes mellitus ([place])   . Urinary hesitancy   . Vitamin D deficiency 11/19/2014    Patient Active Problem List   Diagnosis Date Noted  . Memory loss 02/11/2015  . Vitamin D deficiency 11/19/2014  . Loss of weight 11/11/2014  . Hematuria 03/12/2014  . Frequent falls 11/14/2013  . Anemia 12/16/2012  . BPH (benign prostatic hyperplasia) 10/28/2012  . Nephrolithiasis 10/28/2012  . Environmental allergies 02/01/2012  . Squamous cell carcinoma in situ of skin of forearm 01/23/2011  . Hypogonadism male 01/11/2011  . Plantar fasciitis 05/19/2010  . ERECTILE  DYSFUNCTION, ORGANIC 12/31/2009  . COPD (chronic obstructive pulmonary disease) ([place]) 08/16/2009  . GERD 08/16/2009  . Hepatic steatosis 08/16/2009  . LOW BACK PAIN, CHRONIC 08/16/2009  . MICROALBUMINURIA 08/16/2009  . Diabetes type 2, controlled ([place]) 08/06/2009  . Hyperlipidemia 08/06/2009  . Obesity (BMI 30.0-34.9) 08/06/2009  . ANXIETY DEPRESSION 08/06/2009  . OSA (obstructive sleep apnea) 08/06/2009  . Essential hypertension 08/06/2009  . OSTEOARTHRITIS, KNEES, BILATERAL, MILD 08/06/2009  . PUD, HX OF 08/06/2009  . COLONIC POLYPS, HX OF 08/06/2009    Past Surgical History:  Procedure Laterality Date  . CARDIAC CATHETERIZATION    . CATARACT EXTRACTION W/ INTRAOCULAR LENS IMPLANT Left 08/2009   Dr Katy Fitch  . LEFT HEART CATHETERIZATION WITH CORONARY ANGIOGRAM N/A 11/17/2012   Procedure: LEFT HEART CATHETERIZATION WITH CORONARY ANGIOGRAM;  Surgeon: Burnell Blanks, MD;  Location: [facility] CATH LAB;  Service: Cardiovascular;  Laterality: N/A;  . MELANOMA EXCISION Left 01/23/2011   forearm       Home Medications    Prior to Admission medications   Medication Sig Start Date End Date Taking? Authorizing Provider  aspirin EC 81 MG tablet Take 81 mg by mouth every morning.     [provider]  atorvastatin (LIPITOR) 40 MG tablet Take 1 tablet (40 mg total)  by mouth daily. 08/14/16   [name], NP  fish oil-omega-3 fatty acids 1000 MG capsule Take 2 g by mouth 2 (two) times daily.      [provider]  glucose blood (ONETOUCH VERIO) test strip Use to check blood sugar two times a day.  DX  E11.9 Patient not taking: Reported on 07/13/2016 02/11/15   [name], NP  lisinopril (PRINIVIL,ZESTRIL) 5 MG tablet Take 1 tablet (5 mg total) by mouth daily. 08/14/16   [name], NP  metFORMIN (GLUCOPHAGE) 500 MG tablet TAKE TWO TABLETS BY MOUTH TWICE DAILY WITH FOOD 08/14/16   [name], NP  nitroGLYCERIN (NITROSTAT) 0.4 MG SL tablet Place 1  tablet (0.4 mg total) under the tongue every 5 (five) minutes x 3 doses as needed for chest pain (donot take with viagra). Patient not taking: Reported on 07/13/2016 04/17/14   [name], NP  ONE [facility] LANCETS MISC Use to check blood sugar twice a day.  DX E11.9 Patient not taking: Reported on 07/13/2016 02/13/15   [name], NP  pantoprazole (PROTONIX) 40 MG tablet Take 1 tablet (40 mg total) by mouth daily. 08/14/16   [name], NP  PARoxetine (PAXIL) 30 MG tablet 1 tablet by mouth once daily 08/14/16   [name], NP  tamsulosin (FLOMAX) 0.4 MG CAPS capsule Take 1 capsule (0.4 mg total) by mouth daily. 07/13/16   [name], NP    Family History Family History  Problem Relation Age of Onset  . Arthritis Mother   . Heart disease Sister        Massive MI age 1.  . Arrhythmia Brother   . Melanoma Son   . Heart attack Brother     Social History Social History  Substance Use Topics  . Smoking status: Former Smoker    Packs/day: 1.00    Years: 25.00    Types: Cigarettes    Quit date: 01/05/1982  . Smokeless tobacco: Never Used  . Alcohol use 0.0 oz/week     Comment: "quit drinking in the 1980's"     Allergies   Patient has no known allergies.   Review of Systems Review of Systems ROS: Statement: All systems negative except as marked or noted in the HPI; Constitutional: Negative for fever and chills. ; ; Eyes: Negative for eye pain, redness and discharge. ; ; ENMT: Negative for ear pain, hoarseness, nasal congestion, sinus pressure and sore throat. ; ; Cardiovascular: Negative for chest pain, palpitations, diaphoresis, and peripheral edema. ; ; Respiratory: Negative for cough, wheezing and stridor. ; ; Gastrointestinal: +abd pain, constipation. Negative for nausea, vomiting, diarrhea, blood in stool, hematemesis, jaundice and rectal bleeding. . ; ; Genitourinary: Negative for dysuria, flank pain and hematuria. ; ; Musculoskeletal: Negative for  back pain and neck pain. Negative for swelling and trauma.; ; Skin: Negative for pruritus, rash, abrasions, blisters, bruising and skin lesion.; ; Neuro: Negative for headache, lightheadedness and neck stiffness. Negative for weakness, altered level of consciousness, altered mental status, extremity weakness, paresthesias, involuntary movement, seizure and syncope.       Physical Exam Updated Vital Signs BP (!) 137/108 (BP Location: Right Arm)   Pulse 63   Temp 98.1 F (36.7 C) (Oral)   Resp 18   Ht 6' (1.829 m)   Wt 118.8 kg (262 lb)   SpO2 97%   BMI 35.53 kg/m   Physical Exam 0930: Physical examination:  Nursing notes reviewed; Vital signs and O2 SAT reviewed;  Constitutional: Well developed, Well  nourished, Well hydrated, In no acute distress; Head:  Normocephalic, atraumatic; Eyes: EOMI, PERRL, No scleral icterus; ENMT: Mouth and pharynx normal, Mucous membranes moist; Neck: Supple, Full range of motion, No lymphadenopathy; Cardiovascular: Regular rate and rhythm, No gallop; Respiratory: Breath sounds clear & equal bilaterally, No wheezes.  Speaking full sentences with ease, Normal respiratory effort/excursion; Chest: Nontender, Movement normal; Abdomen: Soft, +left sided tenderness to palp. No rebound or guarding. Nondistended, Normal bowel sounds; Genitourinary: No CVA tenderness; Extremities: Pulses normal, No tenderness, No edema, No calf edema or asymmetry.; Neuro: AA&Ox3, Major CN grossly intact.  Speech clear. No gross focal motor or sensory deficits in extremities.; Skin: Color normal, Warm, Dry.   ED Treatments / Results  Labs (all labs ordered are listed, but only abnormal results are displayed)   EKG  EKG Interpretation None       Radiology   Procedures Procedures (including critical care time)  Medications Ordered in ED Medications  morphine 4 MG/ML injection 4 mg (not administered)  ondansetron (ZOFRAN) injection 4 mg (not administered)  famotidine  (PEPCID) IVPB 20 mg premix (not administered)     Initial Impression / Assessment and Plan / ED Course  I have reviewed the triage vital signs and the nursing notes.  Pertinent labs & imaging results that were available during my care of the patient were reviewed by me and considered in my medical decision making (see chart for details).  MDM Reviewed: nursing note, previous chart and vitals Reviewed previous: labs, ECG and CT scan Interpretation: labs, ECG, x-ray and CT scan   Results for orders placed or performed during the hospital encounter of 08/16/16  Lipase, blood  Result Value Ref Range   Lipase 46 11 - 51 U/L  Comprehensive metabolic panel  Result Value Ref Range   Sodium 140 135 - 145 mmol/L   Potassium 4.2 3.5 - 5.1 mmol/L   Chloride 103 101 - 111 mmol/L   CO2 26 22 - 32 mmol/L   Glucose, Bld 133 (H) 65 - 99 mg/dL   BUN 29 (H) 6 - 20 mg/dL   Creatinine, Ser 1.32 (H) 0.61 - 1.24 mg/dL   Calcium 10.1 8.9 - 10.3 mg/dL   Total Protein 7.3 6.5 - 8.1 g/dL   Albumin 4.2 3.5 - 5.0 g/dL   AST 22 15 - 41 U/L   ALT 17 17 - 63 U/L   Alkaline Phosphatase 58 38 - 126 U/L   Total Bilirubin 1.0 0.3 - 1.2 mg/dL   GFR calc non Af Amer 50 (L) >60 mL/min   GFR calc Af Amer 58 (L) >60 mL/min   Anion gap 11 5 - 15  CBC  Result Value Ref Range   WBC 12.7 (H) 4.0 - 10.5 K/uL   RBC 4.82 4.22 - 5.81 MIL/uL   Hemoglobin 14.1 13.0 - 17.0 g/dL   HCT 42.5 39.0 - 52.0 %   MCV 88.2 78.0 - 100.0 fL   MCH 29.3 26.0 - 34.0 pg   MCHC 33.2 30.0 - 36.0 g/dL   RDW 13.1 11.5 - 15.5 %   Platelets 230 150 - 400 K/uL  Urinalysis, Routine w reflex microscopic  Result Value Ref Range   Color, Urine YELLOW YELLOW   APPearance HAZY (A) CLEAR   Specific Gravity, Urine 1.014 1.005 - 1.030   pH 5.0 5.0 - 8.0   Glucose, UA NEGATIVE NEGATIVE mg/dL   Hgb urine dipstick MODERATE (A) NEGATIVE   Bilirubin Urine NEGATIVE NEGATIVE   Ketones, ur  NEGATIVE NEGATIVE mg/dL   Protein, ur NEGATIVE NEGATIVE  mg/dL   Nitrite NEGATIVE NEGATIVE   Leukocytes, UA NEGATIVE NEGATIVE   RBC / HPF 6-30 0 - 5 RBC/hpf   WBC, UA 0-5 0 - 5 WBC/hpf   Bacteria, UA NONE SEEN NONE SEEN   Squamous Epithelial / LPF 0-5 (A) NONE SEEN   Mucous PRESENT   Differential  Result Value Ref Range   Neutrophils Relative % 70 %   Neutro Abs 8.9 (H) 1.7 - 7.7 K/uL   Lymphocytes Relative 19 %   Lymphs Abs 2.4 0.7 - 4.0 K/uL   Monocytes Relative 9 %   Monocytes Absolute 1.1 (H) 0.1 - 1.0 K/uL   Eosinophils Relative 1 %   Eosinophils Absolute 0.2 0.0 - 0.7 K/uL   Basophils Relative 1 %   Basophils Absolute 0.1 0.0 - 0.1 K/uL  Troponin I  Result Value Ref Range   Troponin I <0.03 <0.03 ng/mL   Dg Chest 2 View Result Date: 08/16/2016 CLINICAL DATA:  LEFT abdominal pain today EXAM: CHEST  2 VIEW COMPARISON:  05/14/2016 FINDINGS: Enlargement of cardiac silhouette. Mediastinal contours and pulmonary vascularity normal. Minimal LEFT basilar atelectasis. Lungs otherwise clear. No pleural effusion or pneumothorax. Bones unremarkable. IMPRESSION: Enlargement of cardiac silhouette with minimal LEFT basilar atelectasis. Electronically Signed   By: Lavonia Dana M.D.   On: 08/16/2016 12:23   Ct Abdomen Pelvis W Contrast Result Date: 08/16/2016 CLINICAL DATA:  Left-sided abdominal pain EXAM: CT ABDOMEN AND PELVIS WITH CONTRAST TECHNIQUE: Multidetector CT imaging of the abdomen and pelvis was performed using the standard protocol following bolus administration of intravenous contrast. CONTRAST:  174mL ISOVUE-300 IOPAMIDOL (ISOVUE-300) INJECTION 61% COMPARISON:  07/14/2016 FINDINGS: Lower chest: Dependent atelectasis. Posterior right lower lobe calcified granuloma. Small pericardial effusion is stable. Hepatobiliary: Diffuse hepatic steatosis. Stable tiny hypodensity in the left lobe on image 11 gallbladder is unremarkable. Pancreas: Unremarkable Spleen: Unremarkable Adrenals/Urinary Tract: Large right staghorn calculus in right hydronephrosis  are stable. Moderate hydronephrosis and perinephric stranding has developed in the left kidney associated with a 7 mm distal left ureteral calculus. Tiny calculi in the lower pole of the left kidney are noted. Adrenal glands are unremarkable. Bladder is decompressed. Stomach/Bowel: Stomach and duodenum are within normal limits. No evidence of small-bowel obstruction. Normal appendix. No obvious mass in the colon. Vascular/Lymphatic: No abnormal retroperitoneal adenopathy. Atherosclerotic calcifications of the aorta. No evidence of aortic aneurysm Reproductive: Prostate is mildly enlarged. Other: No free-fluid. Musculoskeletal: No vertebral compression. L5-S1 grade 1 spondylolisthesis. IMPRESSION: There is a 7 mm distal left ureteral calculus associated with secondary findings of left ureteral obstruction. Right staghorn calculus with right hydronephrosis is stable. Electronically Signed   By: Marybelle Killings M.D.   On: 08/16/2016 11:59    1345:  Pt requesting another dose of pain meds and then he wants to go home. Pt is ambulatory around the ED with steady gait, easy resps, NAD. BUN/Cr elevated from baseline. No UTI on Udip. T/C to Uro Dr. Alyson Ingles, case discussed, including:  HPI, pertinent PM/SHx, VS/PE, dx testing, ED course and treatment:  He has viewed the CT scan, if pt's pain under control then have pt f/u in [place] office on Wednesday. Dx and testing, as well as d/w Uro MD, d/w pt.  Questions answered.  Verb understanding, agreeable to d/c home with outpt f/u in 3 days.   Final Clinical Impressions(s) / ED Diagnoses   Final diagnoses:  None    New Prescriptions New Prescriptions  No medications on file     Francine Graven, DO 08/20/16 1546

## 2016-08-16 NOTE — ED Notes (Signed)
Patient given 6 pack of percocet to go with verbal instructions. Patient understanding instructions.

## 2016-08-17 MED FILL — Oxycodone w/ Acetaminophen Tab 5-325 MG: ORAL | Qty: 6 | Status: AC

## 2016-08-18 LAB — URINE CULTURE

## 2016-09-08 ENCOUNTER — Telehealth: Payer: Self-pay | Admitting: Family

## 2016-09-08 DIAGNOSIS — Z794 Long term (current) use of insulin: Principal | ICD-10-CM

## 2016-09-08 DIAGNOSIS — E118 Type 2 diabetes mellitus with unspecified complications: Secondary | ICD-10-CM

## 2016-09-08 NOTE — Telephone Encounter (Signed)
Spoke with pt and advised him that all meds were sent to pharmacy on 08/14/16 and given 5 refills. He should contact pharmacy for refills. Pt voices understanding.

## 2016-09-08 NOTE — Addendum Note (Signed)
Addended by: Kelle Darting A on: 09/08/2016 02:25 PM   Modules accepted: Orders

## 2016-09-08 NOTE — Telephone Encounter (Signed)
Caller name: Relation to WS:FKCL Call back number:[phone] Pharmacy:[facility]  Reason for call: pt is stating that he is needing all of his current meds called in, states he is out of all of them.

## 2016-09-08 NOTE — Telephone Encounter (Addendum)
Pt brought bag with medication bottles to the office today.  He had a bottle of losartan/hctz that is no longer on his medication list. Confirmed with PCP that pt should not be taking this and medication was discarded.  Pt had no bottle for tamsulosin which is currently on med list. (Pharmacy will fill for pt today).  They will also fill atorvastatin for pt as it is due now. Metformin will not be covered by insurance until 09/10/16 (Patient currently out). Paroxetine is not due to be filled until 10/05/16 and pt only has 17 of these left. Lisinopril not due for refill until 10/19/16 but he only has 1 tablet left in his bottle. Spoke with pharmacist and they will run through for cash price. Notified pt. Per verbal from PCP, we will refer pt to [facility]. Referral placed.

## 2016-09-11 ENCOUNTER — Other Ambulatory Visit: Payer: Self-pay | Admitting: *Deleted

## 2016-09-11 DIAGNOSIS — G4733 Obstructive sleep apnea (adult) (pediatric): Secondary | ICD-10-CM | POA: Diagnosis not present

## 2016-09-11 NOTE — Patient Outreach (Signed)
[facility]) Care Management  09/11/2016  Christian Morris April 29, 1940 [id number]  Telephone Screen  Referral Date: 09/08/16 Referral Source: MD's office (Dr. [name]) Referral Reason: Patient needs help with medication management, gets very confused. Loses or takes meds incorrectly and has run out too early for insurance to cover. Insurance: HTA  S: Outreach attempt # 1 to patient and HIPAA verified with patient.  Patient lives alone. His daughter has limited ability to assist with patient's care. Patient is having difficulty with managing his medications. He confirmed being unable to refill his prescriptions due to the length of time between refills. Patient confirmed, he has a history of DM. He doesn't have a BG monitor and he can't monitor his BG. His last hemoglobin A1C was 6.4, per EMR. He uses a CPAP machine for his sleep apnea. He had ED visits on 08/16/16 and 05/14/16. Patient verbalized, he attempts to manage his medical care. His confusion and forgetfulness interferes with his judgement. [facility] services and benefits explained to patient and he agreed to services.   Plan: RN CM will send referral to [place] RN for further in home eRN CM advised patient that [place] CM would follow up and contact patient within the next 10 days.val/assessment of care needs and management of chronic conditions. RN CM will send [facility] pharmacy referral for medication management. RN CM will send [facility] referral for possible assistance with community resources related to transportation.  [place] Bells, RN, BSN, MHA/MSL, [place] Telephonic Care Manager Coordinator [facility] Direct Phone: [phone] Toll Free: [phone] Fax: [phone]

## 2016-09-14 ENCOUNTER — Other Ambulatory Visit: Payer: Self-pay | Admitting: *Deleted

## 2016-09-14 NOTE — Patient Outreach (Signed)
[facility]) Care Management  09/14/2016  Christian Morris [birth year] [id number]  Christian Morris is a 76 year old gentleman who resides in [place], [place]. 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   Christian Morris was referred to [place] [facility] by his primary care provider, [name] NP for assistance with medication management in the setting of cognitive deficits and poor memory. He was contacted today by our telephonic case management team and referred to the community team for assessment and in person/face to face engagement. Christian Morris has had 2 ED visits in the last 6 months: 05/14/16 (epigastric pain) and 08/16/16 (ureteral calculus). Christian Morris agreed to engagement with [facility] [facility] as he says he is confused and sometimes forgetful and wishes to have assistance with his health care management.   Christian Morris lives alone. He has a daughter in the area but she is limited in her ability to provide assistance/care.   The following health care concerns were identified by our telephonic case management team and will be addressed when I reach out to Christian Morris by phone tomorrow:   Medication Management Concerns - Christian Morris admits to having difficulty managing his medications. He says he has not been able to refill his medications in a timely manner.   Knowledge Deficits re Self Health Management of Chronic Health Condition (DM) -  Christian Morris confirmed that he does not have a cbg meter and has not been monitoring his diabetes. On 79/18 his HgA1C measured 6.4 which is improved from one year ago (6.6).   Knowledge Deficits related to Self Health Management of COPD - Christian Morris is not prescribed any medications for treatment of COPD but uses a CPAP machine for his sleep apnea.   Plan: In the  absence of Jackelyn Poling [facility] Care Management, I will reach out to Christian Morris by phone tomorrow.    [facility] [facility]  [phone]

## 2016-09-15 ENCOUNTER — Ambulatory Visit: Payer: Self-pay | Admitting: *Deleted

## 2016-09-21 ENCOUNTER — Ambulatory Visit: Payer: PPO | Admitting: *Deleted

## 2016-09-21 ENCOUNTER — Other Ambulatory Visit: Payer: Self-pay | Admitting: *Deleted

## 2016-09-21 ENCOUNTER — Telehealth: Payer: Self-pay | Admitting: *Deleted

## 2016-09-21 NOTE — Patient Outreach (Signed)
[facility]) Care Management  09/21/2016  L[place]mith [birth year] [id number]   CSW had received referral from [place], [place] for possible community resources. CSW called & spoke with patient to setup initial home visit for next Monday, 9/24 at 12:15p. Full assessment & note to follow.    Raynaldo Opitz, LCSW [facility]  Clinical Social Worker cell #: [phone]

## 2016-09-21 NOTE — Patient Outreach (Signed)
[facility] [facility]) Care Management  09/21/2016  Seif Teichert Goetzke 1940/07/21 [id number]   Care coordination  [facility] CM spoke with Mr Bamba to review his history, pcp and to schedule an initial home visit  Plans To see Mr Blanchet this week for home visit  [place]. [name], RN, BSN, [place] Care Management [phone]

## 2016-09-22 ENCOUNTER — Telehealth: Payer: Self-pay | Admitting: Family

## 2016-09-22 ENCOUNTER — Other Ambulatory Visit: Payer: Self-pay | Admitting: *Deleted

## 2016-09-22 NOTE — Telephone Encounter (Signed)
Melissa-- please advise? 

## 2016-09-22 NOTE — Telephone Encounter (Signed)
Ok to send please.

## 2016-09-22 NOTE — Telephone Encounter (Signed)
Pt is requesting a Rx for a BP cuff and glucometer. He would like to have it sent in to:   Pharmacy: [facility] [place] - [address]

## 2016-09-23 MED ORDER — SPHYGMOMANOMETER MISC: 0 refills | Status: AC

## 2016-09-23 MED ORDER — ONETOUCH VERIO W/DEVICE KIT: 1.0000 | PACK | Freq: Every day | 0 refills | Status: AC

## 2016-09-23 NOTE — Telephone Encounter (Signed)
Rxs sent and detailed message was left on pt's voicemail and to call if any further concern.

## 2016-09-23 NOTE — Telephone Encounter (Signed)
Received call from front office that pt was requesting that we send needles / strips to his pharmacy for below meter but pt wanted to speak with me. Call was disconnected during transfer. Called pt back and he states that he found his previous test strips and needles and doesn't need rx at this time.

## 2016-09-25 ENCOUNTER — Telehealth: Payer: Self-pay | Admitting: Family

## 2016-09-25 MED ORDER — ONETOUCH LANCETS MISC: 5 refills | Status: AC

## 2016-09-25 MED ORDER — GLUCOSE BLOOD VI STRP: ORAL_STRIP | 5 refills | Status: AC

## 2016-09-25 NOTE — Telephone Encounter (Signed)
[place] coordinator called to say pt received OneTouch Verio glucometer from the pharmacy but no supplies were called in/ordered. Please call in to [facility] in [place] and Test strips. Maudie Mercury [phone] with questions/concerns.

## 2016-09-25 NOTE — Telephone Encounter (Signed)
Lancets and test strips have been sent.

## 2016-09-28 ENCOUNTER — Other Ambulatory Visit: Payer: Self-pay | Admitting: Pharmacist

## 2016-09-28 ENCOUNTER — Other Ambulatory Visit: Payer: Self-pay | Admitting: *Deleted

## 2016-09-28 ENCOUNTER — Encounter: Payer: Self-pay | Admitting: *Deleted

## 2016-09-28 NOTE — Patient Outreach (Signed)
[place] [facility]) Care Management  [facility] Social Work  09/28/2016  Christian Morris Jun 11, 1940 [id number]   Encounter Medications:  Outpatient Encounter Prescriptions as of 09/28/2016  Medication Sig  . PARoxetine (PAXIL) 30 MG tablet 1 tablet by mouth once daily  . acetaminophen (TYLENOL) 500 MG tablet Take 500 mg by mouth every 6 (six) hours as needed.  [name] aspirin EC 81 MG tablet Take 81 mg by mouth every morning.   [name] atorvastatin (LIPITOR) 40 MG tablet Take 1 tablet (40 mg total) by mouth daily.  . Blood Glucose Monitoring Suppl (ONETOUCH VERIO) w/Device KIT 1 kit by Does not apply route daily. Use to check blood sugar once a day.  Dx  E11.9  . Blood Pressure Monitoring (SPHYGMOMANOMETER) MISC Use to check blood pressure daily if needed.  DX I10  . fish oil-omega-3 fatty acids 1000 MG capsule Take 2 g by mouth 2 (two) times daily.    [name] glucose blood (ONETOUCH VERIO) test strip Use to check blood sugar two times a day.  DX  E11.9  . lisinopril (PRINIVIL,ZESTRIL) 5 MG tablet Take 1 tablet (5 mg total) by mouth daily.  . metFORMIN (GLUCOPHAGE) 500 MG tablet TAKE TWO TABLETS BY MOUTH TWICE DAILY WITH FOOD  . nitroGLYCERIN (NITROSTAT) 0.4 MG SL tablet Place 1 tablet (0.4 mg total) under the tongue every 5 (five) minutes x 3 doses as needed for chest pain (donot take with viagra).  . ONE TOUCH LANCETS MISC Use to check blood sugar twice a day.  DX E11.9  . pantoprazole (PROTONIX) 40 MG tablet Take 1 tablet (40 mg total) by mouth daily.  . tamsulosin (FLOMAX) 0.4 MG CAPS capsule Take 1 capsule (0.4 mg total) by mouth daily.   No facility-administered encounter medications on file as of 09/28/2016.     Functional Status:  In your present state of health, do you have any difficulty performing the following activities: 09/22/2016  Hearing? Y  Vision? Y  Difficulty concentrating or making decisions? Y  Walking or climbing stairs? N  Dressing or bathing? N  Doing errands, shopping? N   Preparing Food and eating ? N  Using the Toilet? N  In the past six months, have you accidently leaked urine? Y  Do you have problems with loss of bowel control? N  Managing your Medications? Y  Managing your Finances? N  Housekeeping or managing your Housekeeping? N  Some recent data might be hidden    Fall/Depression Screening:  PHQ 2/9 Scores 09/22/2016 09/11/2016 11/13/2015 08/14/2015 05/02/2015 02/11/2015 04/26/2014  PHQ - 2 Score 0 1 0 0 0 2 0  Exception Documentation - - - - Patient refusal - -    Assessment:  CSW had received referral from [place], Juanita & Dr. Rowe Pavy office (his PCP) that patient needs possible community resources. Patient has intermittent confusion, his caregiver support has limited availability to assist with patient's care. CSW met with patient at his apartment in [place] in [place] today to complete intake assessments and discuss resources. Patient states that he lives alone and does not feel the need to have a caregiver - he has 2 children, son - Christian Morris & daughter, Christian Morris. Son lives in [place] and is easier to get [place] of as his daughter, Christian Morris who lives in [place] works sometimes 7 days a week. Patient states that he has a car and drives to his doctors appointments and runs errands on his own.   CSW spoke with patient  about getting out of his apartment to socialize - patient states that he is not able to go to the [facility] as he states he was asked not to come back after talking with a woman there. CSW spoke with patient about [facility], the senior center in [place] - patient expressed interest & CSW will have [place] mail brochure to patient. Patient states that though he has a car & is able to go grocery shopping, that sometimes he does need to go to a food bank. Patient states that it has been "quite a while" since he has been to "the one on [address]". CSW will have [facility] mail food bank list for [facility].    Plan:   CSW will follow-up with patient in 3 weeks.   [facility] CM Care Plan Problem One     Most Recent Value  Care Plan Problem One  Needs assistance with community resources (senior center, food pantries, etc)  Role Documenting the Problem One  Clinical Social Worker  Care Plan for Problem One  Active  THN Long Term Goal   Over the next 60 days patient will be connected with resources in the community such as senior center in [place] Term Goal Start Date  09/28/16  Interventions for Problem One Long Term Goal  CSW will have brochures on [facility] for [place] bank list for [place], [place] Social Worker cell #: [phone]

## 2016-09-28 NOTE — Patient Outreach (Signed)
[place] [facility]) Care Management  09/28/2016  Christian Morris 01/21/1940 [id number]   Patient was referred to [place] Pharmacist by [facility] RN Curly [place] for "trouble managing medications."   Call placed to patient, HIPAA details verified.   Explained purpose of call and he agreed to call.   Patient reports he does have trouble with his medications but he reports "nurse is helping him."  Noted THN RN has home visit scheduled with patient---patient was okay with [facility] Pharmacist completing home visit to do medication review with him.   Plan:  Collaborated with [facility] RN Kim---see her note in chart from today.   Home visit scheduled.   Karrie Meres, PharmD, [place] [phone]

## 2016-09-28 NOTE — Patient Outreach (Signed)
Request received from Raynaldo Opitz, LCsW to mail patient personal care resources.  Information mailed today.

## 2016-09-28 NOTE — Patient Outreach (Signed)
[place] [facility]) Care Management   09/22/2016  [place] 1940-08-14 [id number]  KALLIN HENK is an 76 y.o. 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 GERD/epigastric pain, ureteral calculus, sleep apnea (CPAP use)   THN CM received a referral from [place] telephonic RN , after she received Referral from: MD's office (Dr. Lemar Livings) Patient needs further in home evaluation/assessment of care needs chronic medical management. Pt has a history of confusion, per MD. His caregiver support, his daughter, has limited ability to assist patient.  He confirmed being unable to refill his prescriptions due to the length of time between refills. Patient confirmed, he has a history of DM. He doesn't have a BG monitor and he can't monitor his BG. His last hemoglobin A1C was 6.4, per EMR. He uses a CPAP machine for his sleep apnea.Mr. Geter has had 2 ED visits in the last 6 months: 05/14/16 (epigastric pain) and 08/16/16 (ureteral calculus).  Patient verbalized, he attempts to manage his medical care. His confusion and forgetfulness interferes with his judgement. [facility] services and benefits explained to patient and he agreed to services.  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  HENT: Positive for ear discharge and hearing loss. Negative for congestion, ear pain, nosebleeds, sinus pain, sore throat and tinnitus.        Reports a concerns with left ear  Eyes: Negative.  Negative for blurred vision, double vision, photophobia, pain, discharge and redness.  Respiratory: Negative.  Negative for cough, hemoptysis, sputum production, shortness of breath, wheezing and stridor.   Cardiovascular: Negative.  Negative for chest pain, palpitations, orthopnea, claudication, leg swelling and PND.  Gastrointestinal: Negative.  Negative for abdominal pain, blood in stool, constipation, diarrhea, heartburn, melena, nausea and vomiting.       Reports having dry mouth  Genitourinary: Negative.  Negative for dysuria, flank pain, frequency, hematuria and urgency.  Musculoskeletal: Positive for back pain and joint pain.       No recent fall  Skin: Negative for itching and rash.  Neurological: Positive for weakness. Negative for dizziness, tingling, tremors, sensory change, speech change, focal weakness, seizures, loss of consciousness and headaches.  Endo/Heme/Allergies: Negative.  Negative for environmental allergies and polydipsia.  Psychiatric/Behavioral: Positive for memory loss. Negative for depression, hallucinations, substance abuse and suicidal ideas. The patient is nervous/anxious. The patient does not have insomnia.     Physical Exam  Constitutional: He is oriented to person, place, and time. He appears well-developed and well-nourished.  HENT:  Head: Normocephalic and atraumatic.  Eyes: Pupils are equal, round, and reactive to light. Conjunctivae are normal.  Neck: Normal range of motion. Neck  supple.  Cardiovascular: Normal rate.   Respiratory: Effort normal and breath sounds normal.  GI: Soft. Bowel sounds are normal.  Musculoskeletal: Normal range of motion.  Neurological: He is alert and oriented to person, place, and time.  Skin: Skin  is warm and dry.  Psychiatric:  Noted some confusion  Needing repeating of instructions by [facility] CM     Encounter Medications:   Outpatient Encounter Prescriptions as of 09/22/2016  Medication Sig  . acetaminophen (TYLENOL) 500 MG tablet Take 500 mg by mouth every 6 (six) hours as needed.  [name] aspirin EC 81 MG tablet Take 81 mg by mouth every morning.   [name] atorvastatin (LIPITOR) 40 MG tablet Take 1 tablet (40 mg total) by mouth daily.  . fish oil-omega-3 fatty acids 1000 MG capsule Take 2 g by mouth 2 (two) times daily.    [name] lisinopril (PRINIVIL,ZESTRIL) 5 MG tablet Take 1 tablet (5 mg total) by mouth daily.  . metFORMIN (GLUCOPHAGE) 500 MG tablet TAKE TWO TABLETS BY MOUTH TWICE DAILY WITH FOOD  . nitroGLYCERIN (NITROSTAT) 0.4 MG SL tablet Place 1 tablet (0.4 mg total) under the tongue every 5 (five) minutes x 3 doses as needed for chest pain (donot take with viagra).  . pantoprazole (PROTONIX) 40 MG tablet Take 1 tablet (40 mg total) by mouth daily.  [name] PARoxetine (PAXIL) 30 MG tablet 1 tablet by mouth once daily  . tamsulosin (FLOMAX) 0.4 MG CAPS capsule Take 1 capsule (0.4 mg total) by mouth daily.  . ondansetron (ZOFRAN) 4 MG tablet Take 1 tablet (4 mg total) by mouth every 8 (eight) hours as needed for nausea or vomiting. (Patient not taking: Reported on 09/22/2016)  . oxyCODONE-acetaminophen (PERCOCET/ROXICET) 5-325 MG tablet Take 2 tablets by mouth every 6 (six) hours as needed for severe pain. (Patient not taking: Reported on 09/22/2016)  . oxyCODONE-acetaminophen (PERCOCET/ROXICET) 5-325 MG tablet 1 or 2 tabs PO q6h prn pain (Patient not taking: Reported on 09/22/2016)  . [DISCONTINUED] glucose blood (ONETOUCH VERIO) test strip Use to check blood sugar two times a day.  DX  E11.9 (Patient not taking: Reported on 07/13/2016)  . [DISCONTINUED] ONE TOUCH LANCETS MISC Use to check blood sugar twice a day.  DX E11.9 (Patient not taking: Reported on 07/13/2016)   No facility-administered encounter  medications on file as of 09/22/2016.     Functional Status:   In your present state of health, do you have any difficulty performing the following activities: 09/22/2016  Hearing? Y  Vision? Y  Difficulty concentrating or making decisions? Y  Walking or climbing stairs? N  Dressing or bathing? N  Doing errands, shopping? N  Preparing Food and eating ? N  Using the Toilet? N  In the past six months, have you accidently leaked urine? Y  Do you have problems with loss of bowel control? N  Managing your Medications? Y  Managing your Finances? N  Housekeeping or managing your Housekeeping? N  Some recent data might be hidden    Fall/Depression Screening:    Fall Risk  09/22/2016 11/13/2015 08/14/2015  Falls in the past year? No No No  Comment - - -  Number falls in past yr: - - -  Injury with Fall? - - -  Risk Factor Category  - - -  Risk for fall due to : History of fall(s);Impaired vision - -  Risk for fall due to: Comment fell years ago wears reading glass - -   PHQ 2/9 Scores 09/22/2016 09/11/2016  11/13/2015 08/14/2015 05/02/2015 02/11/2015 04/26/2014  PHQ - 2 Score 0 1 0 0 0 2 0  Exception Documentation - - - - Patient refusal - -    Assessment:   This THN CM met Mr Vandenbos at his home at his dining room table Mr [place] displayed difficulty in hearing and was noted to lean his left ear towards [facility] CM during all conversations.   THN CM completed written consent process and reviewed [employer] ([employer]) [facility] resource notebook.    Mr Isham confirms his daughter Mariann Laster offers limited support and "works long hours" He reports his son calls to check on him.  Confirms he last saw pcp 08/14/16 and sees provider every 3 months Discussed some leaking of fluid from his left ear  Mr Beaudin admits to being noncompliant with his home care and medications   Medications This was Mr Denicola first concern.  He had 2 bags of medications and inquired what he should be taking.  THN CM reviewed each of his  medications with him (actions and when to take them) [facility] CM wrote on his bottles when to take each of his medications. THN CM separated his daily medications from his extra 90 day supply of medications.  Extra 90 day supply of medications explained and placed in the cabinet of his kitchen.  Mr Wanzer is use to getting only a month supply of medications at time and did not recognized that he had duplicates of medications which caused confusion with taking and ordering further from his pharmacy.  After discussing a 90 day supply of medications had been ordered versus his monthly supply and the need to only use 1 bottle of the 3 bottles each month he stated "Why did they do that?" Mr Buller reports having dry mouth. THN CM recommended over the counter medication for dry mouth after speaking with a [facility] pharmacist   Diabetes (DM) THN CM began to review home care for his DM to Mr Thane reports that he has side effects from metformin to "It makes me nauseated" THN CM encourage taking metformin after a meal or milk intake When discussing hypoglycemia Mr Handyside reports "stay shaky most of time when my blood sugar is low" Present weight 260-265 lb Mr Tatum states Cowlic, NP, talked to him about his weight and blood sugars.  He reports no recent weight loss Mr copley states "I can't lose weight He reports a height of 6 foot   chronic back Mr Stell report a history of falling "off a slide as kid" that continues to hurt him as he gets older "They want to do surgery but I do not want to because I may not be able to walk again" Mr Kirschenbaum share an experience about a family member who had back surgery and since the surgery has not been able to walk.  He reports being managed for pain by pcp and is "satisfied"   Mr Chapdelaine reports drinking lots of water and coffee only am  He reports his last eye exam was "two to three years ago"  Mr Garling reports a history of "build up wax in his ears with the right ear being the  worst"  THN CM and Mr Holstad discussed goals for his medicines, dm, DM diet, DME, and weight with a normal bmi for him at the weight of 184-200 lbs  Mr Spear informed THN CM he prefers a call him before [facility] CM home visit arrivals   Plan:  [facility] CM counseled  him on taking medications as ordered [facility] CM spoke with Griffin Dakin at [employer] ([employer]) to get assist with glucometer and bp cuff in network providers HTA to return a call Mr Lazar toto give more assist with BP cuff. Confirmed [facility] is in network to get glucometer Mr Deblois prefers an one touch from the list provided [facility] Cm spoke with Shiquita at pcp office to get a Rx called in [place] for the one touch verio glucometer THN provided [facility] calendar and instructed on use  THN CM will collaborate with [facility] pharmacist, THN SW  [facility] CM will route this note to pcp, NP, [name] and other listed care team members via [place] Problem One     Most Recent Value  Care Plan Problem One  correct administration of correct medications at correct dosages  Role Documenting the Problem One  Care Management Coordinator  Care Plan for Problem One  Active  [facility] Long Term Goal   over the  next 60 days patient will be administering/taking the correct dosage of medications at the correct time  [facility] Long Term Goal Start Date  09/22/16  Interventions for Problem One Long Term Goal  review and re-review medications, pharmacy consult, consult pcp and pharmacy as needed, discuss possible blister/bubble packing services  THN CM Short Term Goal #1   over the next 30 days patient will have medications reconciled and begin take medications as ordered   [facility] CM Short Term Goal #1 Start Date  09/22/16  Interventions for Short Term Goal #1  review and re-review medications, pharmacy consult, consult pcp and pharmacy as needed, discuss possible blister/bubble packing services    [facility] CM Care Plan Problem Two     Most Recent Value  Care Plan Problem Two   Managment of diabetes at home to include medications, monitoring and MD follow up care   Role Documenting the Problem Two  Care Management Coordinator  Care Plan for Problem Two  Active  Interventions for Problem Two Long Term Goal   Assess knowledge of DM, Assist to get glucometer, Educate on use of glucometer and documenting results, Educated on A1c, Educate on DM medications, Blister/bubble packing if needed,, educate on follow up care for DM (eyes, feet, etc)  THN Long Term Goal  over the next 60 days patient will be able to verbalize and voice understanding of management of diabetes at home  [place] Term Goal Start Date  09/22/16  [facility] CM Short Term Goal #1   In the next 30 days patient will receive a glucometer and document his blood sugars as ordered as evidence by documentation in his new [facility] calendar DM section  THN CM Short Term Goal #1 Start Date  09/22/16  Interventions for Short Term Goal #2   Assess knowledge of DM, Assist to get glucometer, Educate on DM diet, Educate on use of glucometer and documenting results, Educated on A1c, Educate on DM medications, Blister/bubble packing if needed,, educate on follow up care for DM (eyes, feet, etc)      Kimberly L. [name], RN, BSN, [place] Care Management [phone]

## 2016-09-28 NOTE — Patient Outreach (Signed)
[place] [facility]) Care Management  09/28/2016  Jadie Comas [place] 1940-06-26 [id number]   Care coordination  [facility] CM initially met with Mr Savitz on 09/22/16. At the conclusion of the visit [facility] CM assisted with call to [employer], his pcp and [facility] to get in network information on new glucometer needed to assist with daily cbg checks, get order for new glucometer and to get [facility] to provider the one touch verio glucometer kit.   THN CM was called by Mr Puffenbarger on 09/24/16 to state the glucometer kit had been obtain and he needed assist to use it while with another patient. THN CM unable to visit between patients on 09/24/16 and Mr Aung was updated  On 09/25/16  [facility] CM briefly called before arriving and stopped by his home to review the use of the glucometer but found out he had not obtained lancets nor test strips. THN CM educated Mr Morgenstern on the use of his new one touch verio (how to program it, place in the lancets, and strips, how to check cbg) with teach back method completed THN CM took his cbg with CM's glucometer (not fasting had breakfast prior to testing) and had him record it in the DM section of his new THN calendar [facility] CM assisted by contacting pcp and [facility] with new orders for lancets and strips.  Mr Gerdts instructed to check on these items and pick them up when ready. He called at the end of the day to state he had obtained all other items.  On 09/25/16 Mr Stander voiced concern about a scam caller who informed him he could be taken to jail on 09/24/16 evening. Mr Fonseca reports going the local police department on 7/41/28 evening and allowing the officers to check his mobile phone to block calls and try to resolve his concerns. Mr Gloria asked THN CM to assist with entering in the name of [facility], his bank, THN CM and [name] names so he would only answer calls from people he knows.  On 09/25/16 while visiting Mr Gipe son called to check on him.  He states he had not  received a call from [employer] to updated him on the new blood pressure cuff needed  On today Mr Kilroy called to see when [facility] CM would be available to do more education on his glucometer and use teach back method to check his understanding.  THN CM discussed contacting him with possible availability between other patients this week with possibly a student. He voiced understanding and appreciation  THN CM collaborated with [facility] Pharmacist (who has called Mr Caraveo today) about THN CM review of Mr Weldon medications individually on 09/22/16 and placing all the daily medications in a separate bag.  THN CM discussed having to have Mr Groome separate and take the extra 90 day supply of medications for the month of October and November 2018 and place them in a cabinet until ready to use.  Collaborated about potential need of blister or bubble packing versus pill box administration for Mr Dupree related to knowledge barriers and support systems   Plans To see Mr Whitwell for next home visit in 2 weeks with [facility] pharmacist K Reudinger  [facility] CM to briefly visit him this week to show him how to use his glucometer after all items have been obtained from [place]. [name], RN, BSN, [place] Care Management [phone]

## 2016-09-29 ENCOUNTER — Other Ambulatory Visit: Payer: Self-pay | Admitting: *Deleted

## 2016-09-29 NOTE — Patient Outreach (Signed)
[place] [facility]) Care Management  09/29/2016  Marcellas Marchant Mata [birth year] [id number]   Care coordination   THN Cm and [employer] student met with Mr Clingenpeel at his home to re review the instructions to use his new one touch glucometer to test his cbg, blood sugar.  THN CM allowed Mr Lombardo to show THN CM and student the process as THN CM and student talked him through the process He was encouraged to set up the lancet dispenser first today versus turning on the glucometer by placing in the test strip first as he was stating the machine keep cutting off on him and he was having trouble with putting the test strip in the machine THN CM and student stopped and redirected Mr Moler when he did not wash or alcohol swab his finger prior to the stick, attempted to place the lancet needle in the glucometer, when he could not recall how to twist the cap off the lancet needle, when he was noted being confused on which button to push to make the lancet needle activate to stick his finger, and when he was having difficulty putting the test strip in the glucometer. Eventually Mr Norfleet was able to obtain a cbg reading and to record it in his [facility] CM calendar on DM section dated for 09/29/16 [facility] CM and student had a discussion with Mr Shan on how to record his cbg reading after noted confusion with how to record cbg readings THN CM inquired again about his confusion and memory and if he had previously had a neurology consult but Mr Farra stated "There is nothing wrong with my memory.  I am just too dumb."  THN CM and [employer] student discussed with Mr Lease that he was not "too dumb" and encouraged him to continue to practice use of his new glucometer. THN CM has made various visits, calls and attempts since the initial home visit.  Mr Stork calls every day or every other day for questions that has already been answered.   Mr. Ojeda is frequently informed of his scheduled [facility] CM home visit   Plans to See Mr Minder  on October 07 2016 for scheduled home visit  Grant. [name], RN, BSN, [place] Care Management [phone]

## 2016-10-03 ENCOUNTER — Other Ambulatory Visit: Payer: Self-pay | Admitting: *Deleted

## 2016-10-03 NOTE — Patient Outreach (Signed)
[place] [facility]) Care Management  10/03/2016  Christian Morris 11-24-1940 [id number]   Late entry for 10/02/16 1310 Care coordination   THN CM called Christian Morris to follow up on 09/30/16 morning to see if he had success with taking his cbg and he had not Reports getting error messages that [facility] CM is not familiar with.  THN CM again talked him through the process over a period of 15 minutes but he still states he was not successful in getting a cbg reading [facility] CM discouraged frequent use of excess test strips to prevent a delay in being able to possibly obtain further strips per his pharmacy benefit plan (would possibly become an out of pocket expense)   THN CM received a message on 10/01/16 from Christian Morris after [facility] CM asked him on 09/30/16 to try again once a day to test his  Blood sugar.  Not having success and requesting further home visit. THN CM had no availability for a home visit  [facility] CM received a call from Christian Morris and returned a call to Christian Morris on 10/02/16 1302 to see if [facility] CM could briefly visit him between 2 other home visits to provide him with written instructions for how to use his new glucometer Christian Morris reports being at the [employer] on [address] related to having issues with his car starter THN CM met Christian Morris at [employer] and provided the written instructions THN CM had goggled and printed for his glucometer that contained vivid detailed pictures and instructions to help guide him in use of his new glucometer.  CM reviewed these briefly with him and provided a toll free number for him to call one touch customer service to report and discuss the error message he is continuing to receive that [facility] CM is unable to find listed as an error message associated with his glucometer when goggled.  Christian Morris requests another home visit on 10/02/16 but [facility] Cm does not have any further availability and explains this to Christian Morris, the difference in home health and [facility] CM services and  again discusses his scheduled home visit for Wednesday October 07 2016   Plans to see Christian Morris for scheduled home visit next week  Christian Morris L. [name] RN, BSN, CCM [facility] Care Management [phone]'

## 2016-10-05 ENCOUNTER — Telehealth: Payer: Self-pay | Admitting: Family

## 2016-10-05 DIAGNOSIS — G8929 Other chronic pain: Secondary | ICD-10-CM

## 2016-10-05 DIAGNOSIS — M549 Dorsalgia, unspecified: Principal | ICD-10-CM

## 2016-10-05 NOTE — Telephone Encounter (Signed)
Notified pt and he voices understanding. Address and phone number given to pt for [facility] where referral was made. He has been made aware that this referral process may take a few weeks and he may not be contacted right away. Pt voices understanding. States he can hardly stand his pain and having difficulty walking due to his back pain. Pt expresses his appreciation to PCP for the referral.

## 2016-10-05 NOTE — Telephone Encounter (Signed)
Pt would like a referral to pain management . He would like to go to a location in [place] if possible.

## 2016-10-05 NOTE — Telephone Encounter (Signed)
Please contact pt and let him know that I placed pain clinic referral.  I did not see a good pain clinic in [place] so I have referred to [place] instead.

## 2016-10-07 ENCOUNTER — Other Ambulatory Visit: Payer: Self-pay | Admitting: Pharmacist

## 2016-10-07 ENCOUNTER — Other Ambulatory Visit: Payer: Self-pay | Admitting: *Deleted

## 2016-10-07 ENCOUNTER — Telehealth: Payer: Self-pay | Admitting: Family

## 2016-10-07 DIAGNOSIS — R413 Other amnesia: Secondary | ICD-10-CM

## 2016-10-07 NOTE — Telephone Encounter (Signed)
Spoke with RN. She states home is neat and tidy, but pt is very confused about his medications. She is working with the pharmacist and will continue to work with him.  Advised pt that I will re-initiate referral to neurology.    Please contact pt and let him know that I would like him to see neurology and also to allow [facility] to set up pill trays for him.

## 2016-10-07 NOTE — Patient Outreach (Signed)
[name] Care Management   10/07/2016  [name] 1940-11-06 [id number]  Christian Morris is an 76 y.o. 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 GERD/epigastric pain, ureteral calculus, sleep apnea (CPAP use)      Subjective:  "I have allergies" "I'm just too dumb"  Objective:   Review of Systems  Constitutional: Negative for chills, diaphoresis, fever, malaise/fatigue and weight loss.  HENT: Positive for congestion and hearing loss. Negative for ear discharge, ear pain, nosebleeds, sinus pain, sore throat and tinnitus.   Eyes: Negative.  Negative for blurred vision, double vision, photophobia, pain, discharge and redness.  Respiratory: Negative.  Negative for cough, hemoptysis, sputum production, shortness of breath, wheezing and stridor.   Cardiovascular: Negative.  Negative for chest pain, palpitations, orthopnea, claudication, leg swelling and PND.  Gastrointestinal: Negative.  Negative for abdominal pain, blood in stool, constipation, diarrhea, heartburn, melena, nausea and vomiting.  Genitourinary: Negative.  Negative for dysuria, flank pain, frequency, hematuria and urgency.  Musculoskeletal: Positive for back pain and joint pain. Negative for falls, myalgias and neck pain.  Skin: Negative for itching and rash.  Neurological: Negative for dizziness, tingling, tremors, sensory change, speech change, focal weakness, seizures, loss of consciousness, weakness and headaches.  Endo/Heme/Allergies: Negative.  Negative for environmental allergies and polydipsia. Does not bruise/bleed easily.  Psychiatric/Behavioral: Positive for memory loss. Negative for depression, hallucinations, substance abuse and suicidal ideas. The patient is nervous/anxious. The patient does not have insomnia.      Physical Exam  Constitutional: He is oriented to person, place, and time. He appears well-developed and well-nourished.  HENT:  Head: Normocephalic and atraumatic.  Right Ear: External ear normal.  Left Ear: External ear normal.  Eyes: Pupils are equal, round, and reactive to light. Conjunctivae and EOM are normal.  Neck: Normal range of motion. Neck supple.  Cardiovascular: Normal rate, regular rhythm and normal heart sounds.   Respiratory: Effort normal and breath sounds normal.  GI: Soft. Bowel sounds are normal.  Musculoskeletal: Normal range of motion.  Neurological: He is alert and oriented to person, place, and time.  Skin: Skin is warm and dry.  Psychiatric: He has a normal mood and affect. His behavior is normal. Judgment and thought content normal.    Encounter Medications:   Outpatient Encounter Prescriptions as of 10/07/2016  Medication Sig  . acetaminophen (TYLENOL) 500 MG tablet Take 500 mg by mouth every 6 (six) hours as needed.  [name] aspirin EC 81 MG tablet Take 81 mg by mouth every morning.   [name] atorvastatin (LIPITOR) 40 MG tablet Take 1 tablet (40 mg total) by mouth daily.  . Blood Glucose Monitoring Suppl (ONETOUCH VERIO) w/Device KIT 1 kit by Does not apply route daily. Use to check blood sugar once a day.  Dx  E11.9  . Blood Pressure Monitoring (SPHYGMOMANOMETER) MISC Use to check blood pressure daily if needed.  DX I10  . fish oil-omega-3 fatty acids 1000 MG capsule Take 2 g by mouth 2 (two) times daily.    [name] glucose blood (ONETOUCH VERIO) test strip Use to check blood sugar two times a day.  DX  E11.9  . lisinopril (PRINIVIL,ZESTRIL) 5 MG tablet Take 1 tablet (5 mg total) by mouth daily.  . metFORMIN (GLUCOPHAGE) 500 MG tablet TAKE TWO TABLETS BY MOUTH TWICE DAILY WITH FOOD  . nitroGLYCERIN (NITROSTAT) 0.4  MG SL tablet Place 1 tablet (0.4 mg total) under the tongue every 5 (five) minutes x 3 doses as needed for chest pain (donot take with viagra).  . ONE TOUCH  LANCETS MISC Use to check blood sugar twice a day.  DX E11.9  . pantoprazole (PROTONIX) 40 MG tablet Take 1 tablet (40 mg total) by mouth daily.  [name] PARoxetine (PAXIL) 30 MG tablet 1 tablet by mouth once daily  . tamsulosin (FLOMAX) 0.4 MG CAPS capsule Take 1 capsule (0.4 mg total) by mouth daily.   No facility-administered encounter medications on file as of 10/07/2016.     Functional Status:   In your present state of health, do you have any difficulty performing the following activities: 09/22/2016  Hearing? Y  Vision? Y  Difficulty concentrating or making decisions? Y  Walking or climbing stairs? N  Dressing or bathing? N  Doing errands, shopping? N  Preparing Food and eating ? N  Using the Toilet? N  In the past six months, have you accidently leaked urine? Y  Do you have problems with loss of bowel control? N  Managing your Medications? Y  Managing your Finances? N  Housekeeping or managing your Housekeeping? N  Some recent data might be hidden    Fall/Depression Screening:    Fall Risk  09/28/2016 09/22/2016 11/13/2015  Falls in the past year? No No No  Comment - - -  Number falls in past yr: - - -  Injury with Fall? - - -  Risk Factor Category  - - -  Risk for fall due to : - History of fall(s);Impaired vision -  Risk for fall due to: Comment - fell years ago wears reading glass -   PHQ 2/9 Scores 09/22/2016 09/11/2016 11/13/2015 08/14/2015 05/02/2015 02/11/2015 04/26/2014  PHQ - 2 Score 0 1 0 0 0 2 0  Exception Documentation - - - - Patient refusal - -    Assessment:    This is the second scheduled home visit with Christian Morris (there has been other visits that were not scheduled see THN CM notes) THN CM met with him at his apartment with [place] pharmacist to re review new glucometer use, diabetes and medications Christian Morris confirms with [facility] CM that he only completed the 8th grade because he had 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 Discussed taking all medications once in morning (this written on the bottles) when he can remember to take them. He informs CM and pharmacist he is not forgetting to take medications. Noted full bottle of antibiotics on refrigerator dated from July 2018 that were not taken. Flagyl bottle almost full.  Bottle removed from the home and taken to local police station medication drop box along with a box of needles for insulin pins and old one touch flex test strips. Needs new Rx for Nitroglycerin, Pharmacist called in to [place] the Paxil to be filled, discussed missing medicine options, discussed pill administration options.  Christian Morris after given all options prefers to take medicine out of each bottle versus pill box weekly filling or having a drug store bubble or blister pack system  Christian Morris  denies having any pain (narcotics) medication in the home except tylenol and did not produce a bottle during the visit   Diabetes He has called one touch customer service and confirmed with a male customer staff that he will receive the error message he has been receiving if he does not "put the blood on the test strip within two minutes" He also found the instructions for the verio glucometer. Today Christian Fotopoulos was instructed on how to concentrate on getting his blood sample first then turning on his one touch verio machine by entering in the test strip Teaching today again with use of the written hand out and pictures for each step in testing blood sugars. In the practice, review sessions THN CM had Christian Morris to complete each step in series of 10 repetitions. He states he will continue to practice cbg monitoring steps THN CM encouraged Christian Morris to continue the  repetitive steps to assist with committing them to memory with assist of the pictures provided Christian Morris noted having issues with twisting off protective cover of needle, wanting to put cap on lancet device with needle protective cover still intact, pressing the wrong button on lancet device to eject needle for fingerstick, delayed time in applying the blood sample to the test strip within the 2 minute timeframe before the machine automatically shuts off and difficulty with putting the test strip in the glucometer.  THN CM taped the opening on the glucometer he was placing the strip incorrectly in to hopefully prevent this one error in future.  Confirmed on his new one touch verio that there has only been 2 successful cbg September 29, 2016=100 and October 02 2016 =135 He was able to successfully take a cbg reading after lots of practicing of each step over a period of 45 minutes cbg today was 127  Respiratory Noted sniffing Christian Morris states this is related to seasonal allergies Denies allergies to medicines  Memory/confusion Christian Morris noted during home visit to ask questions frequently that were already answered.[name]  He is still noted to lean his left ear towards anyone who is speaking to hear better  Plan:  [facility] CM will follow up with Christian Morris in 2 weeks to continue to review diabetes home care, begin COPD home care and to follow up on preventive care and DME needs Pending response from pcp for more insight related knowledge or memory deficits Continue to collaborate with [facility] SW and pharmacy Route note to pcp   [facility] CM Care Plan Problem One     Most Recent Value  Care Plan Problem One  correct administration of correct medications at correct dosages  Role Documenting the Problem One  Care Management Coordinator  Care Plan for Problem One  Active  THN Long Term Goal   over the  next 60 days patient will be administering/taking the correct dosage of medications at the correct time  Christian Term Goal  Start Date  09/22/16  Interventions for Problem One Long Term Goal  review and re-review medications, pharmacy consult, consult pcp and pharmacy as needed, discuss possible blister/bubble packing services  THN CM Short Term Goal #1   over the next 30 days patient will have medications reconciled and begin take medications as ordered   [facility] CM Short Term Goal #1 Start Date  09/22/16  Interventions for Short Term Goal #1  review and re-review medications, pharmacy consult, consult pcp and pharmacy as needed, discuss possible blister/bubble packing services    [facility]  CM Care Plan Problem Two     Most Recent Value  Care Plan Problem Two  Managment of diabetes at home to include medications, monitoring and MD follow up care   Role Documenting the Problem Two  Care Management [place] for Problem Two  Active  Interventions for Problem Two Long Term Goal   Assess knowledge of DM, Assist to get glucometer, Educate on use of glucometer and documenting results, Educated on A1c, Educate on DM medications, Blister/bubble packing if needed,, educate on follow up care for DM (eyes, feet, etc)  THN Long Term Goal  over the next 60 days patient will be able to verbalize and voice understanding of management of diabetes at home  [place] Term Goal Start Date  09/22/16  THN CM Short Term Goal #1   In the next 30 days patient will receive a glucometer and document his blood sugars as ordered as evidence by documentation in his new [facility] calendar DM section  THN CM Short Term Goal #1 Start Date  09/22/16  Interventions for Short Term Goal #2   Assess knowledge of DM, Assist to get glucometer, Educate on DM diet, Educate on use of glucometer and documenting results, Educated on A1c, Educate on DM medications, Blister/bubble packing if needed,, educate on follow up care for DM (eyes, feet, etc)      Dresean Beckel L. [name], RN, BSN, [place] Care Management [phone]

## 2016-10-07 NOTE — Telephone Encounter (Signed)
[facility] care coordinator Jackelyn Poling [phone] notices pt has confusion and difficulty grasping and learning . She states he seems slow and wants to know if he has history of behavioral problems. Joelene Millin would like a return call to discuss if pt has this hx or if pt needs referral to Neurology.

## 2016-10-07 NOTE — Patient Outreach (Signed)
[facility]) Care Management  10/07/2016  Taro Hidrogo Tvedt 1940/12/22 [id number]   Care coordination    [facility] CM left a message for [name] or her nurse to return a call to [facility] CM related to possible more insight to Mr [place] issues with difficulty in learning how to use his new glucometer, memory, hearing and vision issues today at 20  Mr Mcclaren was seen for a home visit at 0930 today with [facility] pharmacist  Prior to the call form NP, Mr Bohnsack called THN CM at 1447 to state he went to [facility] to get his Paxil but did not get his BP cuff nor the needle container after [facility] CM sent him to [facility] with a note to give to the pharmacist requesting assistance with BP cuff, needle container and Paxil.  THN CM informed it was okay if he only obtained his Paxil at this time.  THN CM informed him she would see for his next scheduled home visit in 2 weeks  Prior to this contact Mr Vastine called x 2 ([phone] & [phone]) and left a message x 1 ([phone]) THN CM called him at 1320 but no answer nor was [facility] CM able to leave a voice message   Received a return call from [name] NP at [place] [phone]) who confirms Mr Capes with more confusion since her last interaction with him,  literacy, hearing, visual and possible neurology issues  THN CM discussed Mr Pfund confirmed today that he only completed eighth grade. NP questions if he completed eight grade, may be a lesser grade than the eighth  Melissa agrees and will assist by ordering a neurology consult for Mr Cryderman She confirms he has a history of a neurology consult being scheduled but Mr Grogan did not attend the appointment.  Mr Vera previously use to see NP for pain management also until he was drug tested after a Rx was filled for ninety day supply and he requested an extra refill. The labs did not show pain medication was being taken.  NP confirmed poor support system also when [facility] CM discussed calls from Mr Sugrue every 1-2 days for  concerns that have already been addressed. Over the last 2 weeks THN CM has made 2 scheduled home visits and 4 unscheduled visits to his home to review glucometer use, pharmacy calls, etc. (refer to [place] notes). NP discusses Mr Reza "may be lonely"  NP, [name] also requests assist with [facility] SW referral for possible medicaid coverage (considering his finances) especially if neurological issues are confirmed and he may need placement for safety concerns. THN SW has already contacted Mr Mountz but [facility] CM shared these concerns discussed today with Gildardo Griffes, [facility]  SW via in basket.  Melissa inquired about Mr Hank's home environment and home care.  THN CM discussed that he and his home are clean, his is living alone and he has food.  THN CM discussed that Mr Reichow refused delivery of medications from companies like [employer] [facility], etc and pill box usage but by the end of his interaction with the [facility] pharmacist today he is reconsidering pill box usage.  Discussed his use of reading glasses and noted leaning of his left ear to whomever is speaking with him.      Plans THN CM will assist with getting Mr Giancola to attend a neurological referral appointment  [facility] CM will continue to work on possible referrals to [place], audiology and ophthalmology.  THN CM  will continue to work with Mr Burda on home care plus DME for his major medical issues THN CM will continue to collaborate with [facility] SW and [place]. [name], RN, BSN, [place] Care Management [phone]

## 2016-10-07 NOTE — Patient Outreach (Signed)
[facility]) Care Management  [place]   10/07/2016  [place] 1940/10/18 [id number]  Subjective:  Initial home visit completed to patient, in conjunction with [facility] RN.    There were concerns with patient management of his medications and patient expresses concern as well.    Patient has a past medical history including but not limited to, hypertension, obstructive sleep apnea, GERD, type 2 diabetes mellitus, BPH, hyperlipidemia, anxiety, depression.  Patient reports he doesn't use a pill box at this time and he takes his medications from the bottles.  He was able to state the frequency of his medications.    Objective:   Encounter Medications: Outpatient Encounter Prescriptions as of 10/07/2016  Medication Sig  . acetaminophen (TYLENOL) 500 MG tablet Take 500 mg by mouth every 6 (six) hours as needed.  [name] aspirin EC 81 MG tablet Take 81 mg by mouth every morning.   [name] atorvastatin (LIPITOR) 40 MG tablet Take 1 tablet (40 mg total) by mouth daily.  . Blood Glucose Monitoring Suppl (ONETOUCH VERIO) w/Device KIT 1 kit by Does not apply route daily. Use to check blood sugar once a day.  Dx  E11.9  . Blood Pressure Monitoring (SPHYGMOMANOMETER) MISC Use to check blood pressure daily if needed.  DX I10  . fish oil-omega-3 fatty acids 1000 MG capsule Take 2 g by mouth 2 (two) times daily.    [name] glucose blood (ONETOUCH VERIO) test strip Use to check blood sugar two times a day.  DX  E11.9  . lisinopril (PRINIVIL,ZESTRIL) 5 MG tablet Take 1 tablet (5 mg total) by mouth daily.  . metFORMIN (GLUCOPHAGE) 500 MG tablet TAKE TWO TABLETS BY MOUTH TWICE DAILY WITH FOOD  . nitroGLYCERIN (NITROSTAT) 0.4 MG SL tablet Place 1 tablet (0.4 mg total) under the tongue every 5 (five) minutes x 3 doses as needed for chest pain (donot take with viagra).  . ONE TOUCH LANCETS MISC Use to check blood sugar twice a day.  DX E11.9  . pantoprazole (PROTONIX) 40 MG tablet Take 1 tablet (40  mg total) by mouth daily.  [name] PARoxetine (PAXIL) 30 MG tablet 1 tablet by mouth once daily  . tamsulosin (FLOMAX) 0.4 MG CAPS capsule Take 1 capsule (0.4 mg total) by mouth daily.   No facility-administered encounter medications on file as of 10/07/2016.     Functional Status: In your present state of health, do you have any difficulty performing the following activities: 09/22/2016  Hearing? Y  Vision? Y  Difficulty concentrating or making decisions? Y  Walking or climbing stairs? N  Dressing or bathing? N  Doing errands, shopping? N  Preparing Food and eating ? N  Using the Toilet? N  In the past six months, have you accidently leaked urine? Y  Do you have problems with loss of bowel control? N  Managing your Medications? Y  Managing your Finances? N  Housekeeping or managing your Housekeeping? N  Some recent data might be hidden    Fall/Depression Screening: Fall Risk  09/28/2016 09/22/2016 11/13/2015  Falls in the past year? No No No  Comment - - -  Number falls in past yr: - - -  Injury with Fall? - - -  Risk Factor Category  - - -  Risk for fall due to : - History of fall(s);Impaired vision -  Risk for fall due to: Comment - fell years ago wears reading glass -   PHQ 2/9 Scores 09/22/2016 09/11/2016 11/13/2015 08/14/2015  05/02/2015 02/11/2015 04/26/2014  PHQ - 2 Score 0 1 0 0 0 2 0  Exception Documentation - - - - Patient refusal - -      Assessment:  Medication review per medication list in chart and patient medication bottles in his possession.    Drugs sorted by system:  Neurologic/Psychologic: -paroxetine, last filled 07/13/16 #90---patient had none left in his possession   Cardiovascular: -aspirin -atorvastatin, last filled 09/08/16 #90  -lisinopril, last filled 08/14/16 #90  -nitroglycerin---noted prescription last issued 2016   Gastrointestinal: -pantoprazole, last filled 08/14/16 #90   Endocrine: -metformin, last filled 09/10/16 #360   Pain: -acetaminophen prn    Vitamins/Minerals: -omega 3 fatty acids---he did not have in his possession  Miscellaneous: -tamsulosin, last filled 09/08/16 #30   Other issues noted:  -paroxetine is associated with more anti-cholinergic ADRs when compared to other SSRIs  -sublingual nitroglycerin---if patient is to have access to this medication---suggest issuing new prescriptions as his may be expired.  Unsure of patient's past cardiac history.    Medication adherence:  -Discussed use of pill box---[facility] Pharmacist could teach him how to fill it, but he would need to routinely fill pill box.   -Discussed use of a pharmacy which offers blister packaging and delivery of medications.    Patient declined both of these options at this time.  He agreed to think about it, but prefers to continue taking medications from bottles at this time.    He was counseled he can take his atorvastatin in the morning if he will remember it better versus at bedtime.    Miscellaneous:  Contacted [facility], and requested patient's paroxetine be refilled as he had none in his possession.    Patient had multiple bottles of lisinopril and pantoprazole due to way his pharmacy filled 90 day supplies.  Combined the individual lisinopril and pantoprazole bottles into one bottle of lisinopril and one bottle of pantoprazole.    Plan:  Will route note to PCP.  Will place follow-up call to patient in the next 2 weeks.   Karrie Meres, PharmD, [place] [phone]

## 2016-10-08 ENCOUNTER — Telehealth: Payer: Self-pay | Admitting: Family

## 2016-10-08 ENCOUNTER — Other Ambulatory Visit: Payer: Self-pay | Admitting: Family

## 2016-10-08 NOTE — Telephone Encounter (Signed)
The patient needs something called in to the pharmacy for his prostate. He doesn't know the name of the medication.   Send to:  [facility] [address] [place] - [place] [address] [phone] (Phone) [phone] (Fax)

## 2016-10-09 MED ORDER — TAMSULOSIN HCL 0.4 MG PO CAPS: 0.4000 mg | ORAL_CAPSULE | Freq: Every day | ORAL | 5 refills | Status: DC

## 2016-10-09 NOTE — Telephone Encounter (Signed)
Notified pt and he voices understanding and is agreeable to proceed with [facility] assistance. He has already been contacted by neuro and has appt on Monday, 10/12/16 at [place] pt appt date / time, address and phone # and he voices understanding.

## 2016-10-09 NOTE — Telephone Encounter (Signed)
Tamsulosin on med list shows last refills went to [facility]. Sent new refills to [facility] and notified pt.

## 2016-10-12 ENCOUNTER — Other Ambulatory Visit: Payer: Self-pay | Admitting: Pharmacist

## 2016-10-12 ENCOUNTER — Ambulatory Visit: Payer: PPO | Admitting: Neurology

## 2016-10-12 ENCOUNTER — Other Ambulatory Visit: Payer: Self-pay | Admitting: *Deleted

## 2016-10-12 NOTE — Patient Outreach (Signed)
[facility]) Care Management  10/12/2016  Christian Morris Mar 19, 1940 [id number]   Care coordination   [facility] CM collaborated with [facility] pharmacist K Reudinger, after he received a call from Mr Woehrle intitially about medications concerns then about glucometer monitoring "test strips" [facility] CM discussed further education with pictures and video completed last week and allowing Mr Faulstich to complete a teach back demonstration to obtain a successful cbg reading with documentation. [facility] CM discussed Mr Vanderpool made aware that he is continue to try taking his cbg as ordered but [facility] CM would see him next week for further education.  Discussed Mr Hank's call to [facility] about getting his Paxil filled and not obtaining a needle container or BP cuff as discussed by [facility] CM last week. [facility] CM discussed contact was made with his NP on last week for an update and discussions of his support system/possible lonliness, home environment, finances, past medical interventions from NP, pain management hx, literacy, collaboration with [facility] SW and neurology consult.  [facility] CM made contact with NP for further recommendations for glucometer monitoring and to updated her    Plans Mr Jarrells to be seen by [facility] Pharmacist on this week and by [facility] CM on next week  Route note to pcp Continue to collaborate with [facility] SW and pharmacist   Joelene Millin L. [name], RN, BSN, [place] Care Management [phone]

## 2016-10-12 NOTE — Patient Outreach (Signed)
[place] [facility]) Care Management  10/12/2016  Christian Morris 29-Jan-1940 [id number]  Received a voicemail from patient requesting a call back regarding concern about his medications.   Successful phone outreach to patient---HIPAA details verified.  Patient reports he has problems with different test strips for his glucometer.  Discussed with patient his voicemail mentioned medications, he states it is his test strips.    Discussed with patient could make a home visit to review test strips with him, he agrees.   Collaborated on case with [facility] RN Maudie Mercury who confirmed she has used visual aids, videos, demonstration for patient's glucometer.    Plan:  Home visit scheduled with patient to review medications and test strips supplies.    Karrie Meres, PharmD, [place] [phone]

## 2016-10-13 ENCOUNTER — Other Ambulatory Visit: Payer: Self-pay | Admitting: Pharmacist

## 2016-10-13 NOTE — Patient Outreach (Deleted)
[facility] ([facility]) Care Management  [facility] Pharm[facility]   10/13/2016  Christian Morris 02/16/1940 [id number]  Subjective:  Objective:   Encounter Medications: Outpatient Encounter Prescriptions as of 10/13/2016  Medication Sig  . acetaminophen (TYLENOL) 500 MG tablet Take 500 mg by mouth every 6 (six) hours as needed.  . aspirin EC 81 MG tablet Take 81 mg by mouth every morning.   . atorvastatin (LIPITOR) 40 MG tablet Take 1 tablet (40 mg total) by mouth daily.  . Blood Glucose Monitoring Suppl (ONETOUCH VERIO) w/Device KIT 1 kit by Does not apply route daily. Use to check blood sugar once a day.  Dx  E11.9  . Blood Pressure Monitoring (SPHYGMOMANOMETER) MISC Use to check blood pressure daily if needed.  DX I10  . fish oil-omega-3 fatty acids 1000 MG capsule Take 2 g by mouth 2 (two) times daily.    . glucose blood (ONETOUCH VERIO) test strip Use to check blood sugar two times a day.  DX  E11.9  . lisinopril (PRINIVIL,ZESTRIL) 5 MG tablet Take 1 tablet (5 mg total) by mouth daily.  . metFORMIN (GLUCOPHAGE) 500 MG tablet TAKE TWO TABLETS BY MOUTH TWICE DAILY WITH FOOD  . nitroGLYCERIN (NITROSTAT) 0.4 MG SL tablet Place 1 tablet (0.4 mg total) under the tongue every 5 (five) minutes x 3 doses as needed for chest pain (donot take with viagra). (Patient not taking: Reported on 10/08/2016)  . ONE TOUCH LANCETS MISC Use to check blood sugar twice a day.  DX E11.9  . pantoprazole (PROTONIX) 40 MG tablet Take 1 tablet (40 mg total) by mouth daily.  . PARoxetine (PAXIL) 30 MG tablet 1 tablet by mouth once daily (Patient not taking: Reported on 10/08/2016)  . tamsulosin (FLOMAX) 0.4 MG CAPS capsule Take 1 capsule (0.4 mg total) by mouth daily.   No facility-administered encounter medications on file as of 10/13/2016.     Functional Status: In your present state of health, do you have any difficulty performing the following activities: 09/22/2016  Hearing? Y  Vision? Y  Difficulty  concentrating or making decisions? Y  Walking or climbing stairs? N  Dressing or bathing? N  Doing errands, shopping? N  Preparing Food and eating ? N  Using the Toilet? N  In the past six months, have you accidently leaked urine? Y  Do you have problems with loss of bowel control? N  Managing your Medications? Y  Managing your Finances? N  Housekeeping or managing your Housekeeping? N  Some recent data might be hidden    Fall/Depression Screening: Fall Risk  09/28/2016 09/22/2016 11/13/2015  Falls in the past year? No No No  Comment - - -  Number falls in past yr: - - -  Injury with Fall? - - -  Risk Factor Category  - - -  Risk for fall due to : - History of fall(s);Impaired vision -  Risk for fall due to: Comment - fell years ago wears reading glass -   PHQ 2/9 Scores 09/22/2016 09/11/2016 11/13/2015 08/14/2015 05/02/2015 02/11/2015 04/26/2014  PHQ - 2 Score 0 1 0 0 0 2 0  Exception Documentation - - - - Patient refusal - -      Assessment:  Plan: 

## 2016-10-13 NOTE — Patient Outreach (Signed)
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    Patient reports difficulty checking his blood glucose.  He reports having two different types of tests strips.  He reports meter keeps reading "160."    Objective:   Encounter Medications: Outpatient Encounter Prescriptions as of 10/13/2016  Medication Sig  . acetaminophen (TYLENOL) 500 MG tablet Take 500 mg by mouth every 6 (six) hours as needed.  [name] aspirin EC 81 MG tablet Take 81 mg by mouth every morning.   [name] atorvastatin (LIPITOR) 40 MG tablet Take 1 tablet (40 mg total) by mouth daily.  . Blood Glucose Monitoring Suppl (ONETOUCH VERIO) w/Device KIT 1 kit by Does not apply route daily. Use to check blood sugar once a day.  Dx  E11.9  . Blood Pressure Monitoring (SPHYGMOMANOMETER) MISC Use to check blood pressure daily if needed.  DX I10  . fish oil-omega-3 fatty acids 1000 MG capsule Take 2 g by mouth 2 (two) times daily.    [name] glucose blood (ONETOUCH VERIO) test strip Use to check blood sugar two times a day.  DX  E11.9  . lisinopril (PRINIVIL,ZESTRIL) 5 MG tablet Take 1 tablet (5 mg total) by mouth daily.  . metFORMIN (GLUCOPHAGE) 500 MG tablet TAKE TWO TABLETS BY MOUTH TWICE DAILY WITH FOOD  . nitroGLYCERIN (NITROSTAT) 0.4 MG SL tablet Place 1 tablet (0.4 mg total) under the tongue every 5 (five) minutes x 3 doses as needed for chest pain (donot take with viagra). (Patient not taking: Reported on 10/08/2016)  . ONE TOUCH LANCETS MISC Use to check blood sugar twice a day.  DX E11.9  . pantoprazole (PROTONIX) 40 MG tablet Take 1 tablet (40 mg total) by mouth daily.  [name] PARoxetine (PAXIL) 30 MG tablet 1 tablet by mouth once daily (Patient not taking: Reported on 10/08/2016)  . tamsulosin (FLOMAX) 0.4 MG CAPS capsule Take 1 capsule (0.4 mg total) by mouth daily.   No  facility-administered encounter medications on file as of 10/13/2016.     Functional Status: In your present state of health, do you have any difficulty performing the following activities: 09/22/2016  Hearing? Y  Vision? Y  Difficulty concentrating or making decisions? Y  Walking or climbing stairs? N  Dressing or bathing? N  Doing errands, shopping? N  Preparing Food and eating ? N  Using the Toilet? N  In the past six months, have you accidently leaked urine? Y  Do you have problems with loss of bowel control? N  Managing your Medications? Y  Managing your Finances? N  Housekeeping or managing your Housekeeping? N  Some recent data might be hidden    Fall/Depression Screening: Fall Risk  09/28/2016 09/22/2016 11/13/2015  Falls in the past year? No No No  Comment - - -  Number falls in past yr: - - -  Injury with Fall? - - -  Risk Factor Category  - - -  Risk for fall due to : - History of fall(s);Impaired vision -  Risk for fall due to: Comment - fell years ago wears reading glass -   PHQ 2/9 Scores 09/22/2016 09/11/2016 11/13/2015 08/14/2015 05/02/2015 02/11/2015 04/26/2014  PHQ - 2 Score 0 1 0 0 0 2 0  Exception Documentation - - - - Patient refusal - -      Assessment:  Reviewed glucometer which  showed the following readings:  10/13/16-1028---135 10/12/16-1833---167 10/11/16-1833---98 10/11/16-1829---84 10/08/16-0742---139 10/07/16-1053---127  Reviewed test strips with patient---he had One Touch Ultra strips with expiration date of 2013 and One Touch Verio strips.    Counseled patient to discard One Touch Ultra strips as strips won't work with his meter and are expired.    Counseled patient utilizing teach back method how to use his glucometer.  Discussed how to load the lancet in lancing device and to rotate finger and site on finger he uses to check blood sugar.    Counseled patient how to load test strip into meter and where to place blood sample on strip.  Patient was  able to successfully check his blood sugar.  Provided positive reinforcement that he was able to correctly check his blood sugar.     Plan:  Follow-up with patient via phone as previously scheduled.   Karrie Meres, PharmD, [place] [phone]

## 2016-10-19 ENCOUNTER — Other Ambulatory Visit: Payer: Self-pay | Admitting: *Deleted

## 2016-10-19 ENCOUNTER — Ambulatory Visit: Payer: Self-pay | Admitting: Pharmacist

## 2016-10-19 NOTE — Patient Outreach (Signed)
[place]) Care Management  10/19/2016  Christian Morris [birth year] [id number]   CSW called & spoke with patient to complete financial assessment to determine whether patient would qualify for Medicaid. Patient reported that he receives $1300 a month which would be over the limit for Medicaid. CSW spoke with patient about his living arrangement and whether he would want to look into assisted livings - patient states that his rent is $295 a month and that he is very content with where he is now and feels that he gets around fine.   CSW spoke with patient about neurology consult that was suggested by NP, [name] - but patient states that he had not heard anything about an appointment.   CSW will check back with patient in 3 weeks.    Raynaldo Opitz, LCSW [facility]  Clinical Social Worker cell #: [phone]

## 2016-10-20 ENCOUNTER — Other Ambulatory Visit: Payer: Self-pay | Admitting: Pharmacist

## 2016-10-20 NOTE — Patient Outreach (Signed)
[place] [facility]) Care Management  10/20/2016  Christian Morris [birth year] [id number]  Successful phone outreach to patient, HIPAA details verified.  Patient reports he has been "doing good with meter."  He reports he is not at home and unable to review his readings but denies receiving error message lately and reports his readings have been "in the 130's."  Patient reports he isn't sure he wants to use blister packages at this time but may be interested in trying a pill box.  He didn't want to set up a home visit at this time.   Plan:  Will make outreach attempt to patient in the next 2 weeks to set up a home visit to review pill box with patient.   Karrie Meres, PharmD, [place] [phone]

## 2016-10-21 ENCOUNTER — Other Ambulatory Visit: Payer: Self-pay | Admitting: *Deleted

## 2016-10-21 ENCOUNTER — Telehealth: Payer: Self-pay | Admitting: Family

## 2016-10-21 NOTE — Telephone Encounter (Signed)
Referral faxed to [facility] Neurology, awaiting appt

## 2016-10-21 NOTE — Patient Outreach (Signed)
[facility]) Care Management   10/21/2016  Christian Morris 08/09/1940 [id number]   Subjective: see below  Objective:    BP 130/88   Pulse 61   Temp 97.8 F (36.6 C) (Oral)   Resp 20   Ht 1.829 m (6')   Wt 260 lb (117.9 kg)   SpO2 97%   BMI 35.26 kg/m   Review of Systems  HENT: Positive for hearing loss.   Eyes: Negative.   Respiratory: Negative.   Cardiovascular: Negative.   Gastrointestinal: Negative.   Genitourinary: Negative.   Musculoskeletal: Positive for joint pain.  Skin: Negative.   Neurological: Positive for weakness. Negative for dizziness, tingling, tremors, sensory change, speech change, focal weakness, seizures, loss of consciousness and headaches.  Endo/Heme/Allergies: Negative.   Psychiatric/Behavioral: Negative.     Physical Exam  Constitutional: He is oriented to person, place, and time. He appears well-developed and well-nourished.  HENT:  Head: Normocephalic and atraumatic.  Eyes: Pupils are equal, round, and reactive to light. Conjunctivae are normal.  Neck: Normal range of motion. Neck supple.  Cardiovascular: Normal rate, regular rhythm and normal heart sounds.   Respiratory: Effort normal and breath sounds normal.  GI: Soft. Bowel sounds are normal.  Musculoskeletal: Normal range of motion.  Neurological: He is alert and oriented to person, place, and time.  Skin: Skin is warm and dry.  Psychiatric: He has a normal mood and affect. His behavior is normal. Judgment and thought content normal.    Encounter Medications:   Outpatient Encounter Prescriptions as of 10/21/2016  Medication Sig  . acetaminophen (TYLENOL) 500 MG tablet Take 500 mg by mouth every 6 (six) hours as needed.  [name] aspirin EC 81 MG tablet Take 81 mg by mouth every morning.   [name] atorvastatin (LIPITOR) 40 MG tablet Take 1 tablet (40 mg total) by mouth daily.  . Blood Glucose Monitoring Suppl (ONETOUCH VERIO) w/Device KIT 1 kit by Does not apply route daily.  Use to check blood sugar once a day.  Dx  E11.9  . Blood Pressure Monitoring (SPHYGMOMANOMETER) MISC Use to check blood pressure daily if needed.  DX I10  . fish oil-omega-3 fatty acids 1000 MG capsule Take 2 g by mouth 2 (two) times daily.    [name] glucose blood (ONETOUCH VERIO) test strip Use to check blood sugar two times a day.  DX  E11.9  . lisinopril (PRINIVIL,ZESTRIL) 5 MG tablet Take 1 tablet (5 mg total) by mouth daily.  . metFORMIN (GLUCOPHAGE) 500 MG tablet TAKE TWO TABLETS BY MOUTH TWICE DAILY WITH FOOD  . nitroGLYCERIN (NITROSTAT) 0.4 MG SL tablet Place 1 tablet (0.4 mg total) under the tongue every 5 (five) minutes x 3 doses as needed for chest pain (donot take with viagra).  . ONE TOUCH LANCETS MISC Use to check blood sugar twice a day.  DX E11.9  . pantoprazole (PROTONIX) 40 MG tablet Take 1 tablet (40 mg total) by mouth daily.  [name] PARoxetine (PAXIL) 30 MG tablet 1 tablet by mouth once daily  . tamsulosin (FLOMAX) 0.4 MG CAPS capsule Take 1 capsule (0.4 mg total) by mouth daily.   No facility-administered encounter medications on file as of 10/21/2016.     Functional Status:   In your present state of health, do you have any difficulty performing the following activities: 09/22/2016  Hearing? Y  Vision? Y  Difficulty concentrating or making decisions? Y  Walking or climbing stairs? N  Dressing or bathing? N  Doing errands, shopping?  N  Preparing Food and eating ? N  Using the Toilet? N  In the past six months, have you accidently leaked urine? Y  Do you have problems with loss of bowel control? N  Managing your Medications? Y  Managing your Finances? N  Housekeeping or managing your Housekeeping? N  Some recent data might be hidden    Fall/Depression Screening:    Fall Risk  09/28/2016 09/22/2016 11/13/2015  Falls in the past year? No No No  Comment - - -  Number falls in past yr: - - -  Injury with Fall? - - -  Risk Factor Category  - - -  Risk for fall due to : -  History of fall(s);Impaired vision -  Risk for fall due to: Comment - fell years ago wears reading glass -   PHQ 2/9 Scores 09/22/2016 09/11/2016 11/13/2015 08/14/2015 05/02/2015 02/11/2015 04/26/2014  PHQ - 2 Score 0 1 0 0 0 2 0  Exception Documentation - - - - Patient refusal - -    Assessment:   THN CM met Christian Morris at his home He is still noted to turn his left ear to [facility] CM when [facility] CM is talking and asks THN CM to repeat answers and questions He is wearing reading glasses but also states he is having trouble seeing items on the glucometer.  He  informs THN CM he has been testing his cbg and has only been getting 135   Glucometer testing THN CM allowed Christian Morris to test his cbg since his glucometer shows he has not checked for today  He tested once and got an error message Error 4 strip fill problem retest with a new strip He tested again and got an error message Error 1 strip error use a new strip  On the third try Christian Morris noted to attempt to stick him self with the wrong end of the lancet and got another error message Error 4 strip fill problem retest with a new strip On the fourth try THN CM instructed Christian Morris not to smear blood on the test strip and allow the strip to draw in the blood by holding the drop near the test strip and his cbg value is 121 today THN CM also educated Christian Morris on keeping his glucometer, lancet clean   THN CM discussed his glucometer came with 50 test strips and THN CM counted them to find 28 test strips left He used 4 test strips today making the count now 24 Christian Morris encouraged to continue to practice using his glucometer. He states he will now concentrate on not trying to scrap his test strip with the drop of blood but allow the test strip to pull/draw the blood in   Medications THN CM reviewed [place] notes from 10/20/16 when pharmacy called Christian Morris who told him he had been testing his cbgs  Per his glucometer the last time he checked his cbg was on 10/13/16 when  [facility] pharmacist visited  His glucometer states these 9 readings  10/21/16 1100 121 10/13/16 1028 135 10/8/181833 167 10/11/16 1833  98 10/11/16 1829 84 10/08/16 139 139 10/07/16 1053 127 10/02/16 1658 135 09/29/16 1032 100 Denies being aware of family hx of DM to include his son and daughter Christian Morris reports he and [place] agreed on pill box not blister packs Christian Morris informed THN CM he agreed to this because he noted he had not taken one of medications. THN CM agreed  to allow [facility] pharmacist to work with Christian Anastas on pill box use in future  Support system Lived in [place] 25 yrs returned to [place] related son and daughter encouragement. Still does not see them much related to daughter working "twelve hour shifts" and son married and working Investment banker, corporate hx Christian Tomberlin denies knowing about lung nodules call [employer] to see which bp HTA allows on his plan ?   Neurology appointment Christian Morris did not go to his October 12 2016 at [facility] appointment that was scheduled He informed [facility] CM he was having trouble with his car (needed a new starter on his car that cost $200- car still not working well- "not sure if it will crank or not when I go out there") and believes he remember getting a call from his pcp office about a neurology appointment  [facility] CM discussed the importance of the neurology appointment and transportation options available- [facility] and his son and daughter.  Christian Lindquist asked what a neurologist does and [facility] CM discussed a neurologist works with memory and nerves.  THN CM discussed his missed Neurology appointment in the past per and recently  [facility] CM had Christian Espin to call his pcp to see if he could reschedule the neurology appointment.   Pain management He called 10/05/16 to request a pain management referral "I don't know " when [facility] CM asked him if he heard back from pcp office  Phone concerns Christian Morris voiced concern that he does not know how to check his voice  messages on his cell. THN CM found the voice message from Byron from [place] in his phone and played the neurology referral  message for him Future Audiology and Opthalmology appointments- [facility] CM had Christian Morris to call HTA concierge at 1 [phone] . THN CM showed him on his HTA insurance card how to read the card and how to find the member area on back of the HTA card that indicates the healthcare Concierge # plus the listing of his co pays for pcps and other providers [facility] CM had Christian Morris to dial the healthcare Concierge pcp name given as [name] Added in HTA system by Jonelle Sidle In network Neurologist- [facility]- [place], [place] [phone] HTA co-pay In network Ophthalmologist Rutherford Guys [phone] with a $35 co pay  In  [place] at [employer] teoh [phone] with a $35 co pay  Food Christian Morris had a visit from [place] members of his neighbor, Ron Banker, christine, ron) His refrigerator is empty Brought eggs, butter, grapes, cheese, cereal, canned foods, etc He states his daughter had assisted him before "to get to a place near the [employer] hardware [employer]" for food assistance He reports he has " to go back up there to re sign up"- He can not recall the name of the company  BP cuffs is not a covered DME per Jonelle Sidle (HTA Concierge) Tiffany noted a message on the HTA screen from [place] related to an oral anti nausea drug, Zofran, Pharmacy questioning if Zofran was used for chemo purposes to assist with determining how to bill for Zofran Christian Satchell Confirmed with Jonelle Sidle that he does not have a cancer dx and has not been on chemo Tiffany spoke with HTA pharmacy staff  [facility] CM informed Tiffany that [facility] CM reviewed Christian Kathman medications during home visits and he does not have Zofran THN CM  found in EPIC chart review that Christian Pollan had abdominal pain and was prescribed Zofran by Francine Graven, [facility] ED MD, during a 08/16/16 ED visit for abdominal pain  Christian Clingenpeel last filled the Rx on 08/27/16 Confirmed Zofran was not used for oncology reasons Tiffany entered this in [place] (Ask for [place])  [facility]. for [employer]  [address] [place] [address]  [phone]  Free to meet with him per June  [facility] CM & Christian Morris called and spoke with June about possible assist to confirm he has the most beneficial insurance plan considering his financial difficulties June requested Christian Morris bring his medicare card and all his prescription drugs which will be used to assist in pulling up best insurance plan for 2019 - Plus June report Christian Morris maybe able to get help with medications if needed. Christian Morris denies concerns with affording the present medications being prescribed.  He was given an appointment for 1 pm 10/22/16 to meet with June  His son called to check on him during the home visit   Plan:  To see Christian Morris In 2-3 weeks  Route note to pcp and other care team members  [facility] CM Care Plan Problem One     Most Recent Value  Care Plan Problem One  correct administration of correct medications at correct dosages  Role Documenting the Problem One  Care Management [place] for Problem One  Active  THN Long Term Goal   over the  next 60 days patient will be administering/taking the correct dosage of medications at the correct time  Morrison Term Goal Start Date  09/22/16  Interventions for Problem One Long Term Goal  review and re-review medications, pharmacy consult, consult pcp and pharmacy as needed, discuss possible blister/bubble packing services  THN CM Short Term Goal #1   over the next 30 days patient will have medications reconciled and begin take medications as ordered   [facility] CM Short Term Goal #1 Start Date  09/22/16  Interventions for Short Term Goal #1  review and re-review medications, pharmacy consult, consult pcp and pharmacy as needed, discuss possible  blister/bubble packing services    [facility] CM Care Plan Problem Two     Most Recent Value  Care Plan Problem Two  Managment of diabetes at home to include medications, monitoring and MD follow up care   Role Documenting the Problem Two  Care Management Coordinator  Care Plan for Problem Two  Active  Interventions for Problem Two Long Term Goal   Assess knowledge of DM, Assist to get glucometer, Educate on use of glucometer and documenting results, Educated on A1c, Educate on DM medications, Blister/bubble packing if needed,, educate on follow up care for DM (eyes, feet, etc)  THN Long Term Goal  over the next 60 days patient will be able to verbalize and voice understanding of management of diabetes at home  [place] Term Goal Start Date  09/22/16  THN CM Short Term Goal #1   In the next 30 days patient will receive a glucometer and document his blood sugars as ordered as evidence by documentation in his new [facility] calendar DM section  THN CM Short Term Goal #1 Start Date  09/22/16  Interventions for Short Term Goal #2   Assess knowledge of DM, Assist to get glucometer, Educate on DM diet, Educate on use of glucometer and documenting results, Educated  on A1c, Educate on DM medications, Blister/bubble packing if needed,, educate on follow up care for DM (eyes, feet, etc)     Deverick Pruss L. [name], RN, BSN, [place] Care Management [phone]

## 2016-10-21 NOTE — Telephone Encounter (Signed)
Maudie Mercury at [place] [phone] called to recommend Dr Phillips Odor for neurological consult. She states this provider will be covered better with pt insurance. Also please make pt appt after 11/03/16 so he can have his social security check to pay the $20.00 copay required with his ins.

## 2016-10-30 ENCOUNTER — Telehealth: Payer: Self-pay | Admitting: *Deleted

## 2016-10-30 NOTE — Patient Outreach (Signed)
[facility]) Care Management  10/30/2016  Christian Morris 04-01-1940 [id number]   Care coordination   [facility] CM received 2 calls from Christian Morris today 10/30/16 with 2 messages and one call from him on yesterday 10/29/16 with one message left. Christian Morris left messages stating he "had not heard from anybody.  I was trying to get a hold of you", "my phone has been out"  [facility] CM returned a call to Christian Morris who reports his [employer] phone has out for 3 days but he took it to [employer] and it was fix.  "I thought you had given up on me"  [facility] CM reminded him x 3 during the call of the scheduled [facility] CM appointment date and time for next week.  Recommended he check his voice mail and call his pcp to check on the status of neurology consult  Christian Morris reports he went to [facility] [place] [facility] appointment but he did not tell the staff nor take his medications to Christian Morris to assist, therefore, nothing was completed. "I forgot", "They wanted to know my medications and I don't think anyone else needs to know except you all." [facility] CM reminded him that CM had written down the address for his [facility] appointment with the telephone number, contact person as Christian Morris and what he needed to take with him to the appointment. Reminded Christian Morris that Christian Morris had explained when the appointment was made that his medication usage would assist her to see if another insurance plan would be better for him related to the cost and him reporting that he could not afford certain coverage.  The appointment was not rescheduled. THN CM recommends he return a call to Christian Morris to reschedule the appointment.  Since [facility] CM has been following Christian Morris he has not attended 2 scheduled appointments (Neurology nor [facility])  Plan [facility] CM will see Christian Morris for his next scheduled home visit on next week. THN CM and Christian Morris agreed to change the time of the appointment  Christian Morris L. Christian Hamman, RN, BSN, [place] Care Management [phone]

## 2016-11-03 ENCOUNTER — Other Ambulatory Visit: Payer: Self-pay | Admitting: Pharmacist

## 2016-11-03 NOTE — Patient Outreach (Signed)
[facility]) Care Management  11/03/2016  Christian Morris 10/28/1940 [id number]  Unsuccessful phone outreach to patient.  HIPAA compliant message left requesting return call.   Plan:  If no return call, will make second outreach attempt within the next week.   Karrie Meres, PharmD, [place] [phone]

## 2016-11-04 ENCOUNTER — Ambulatory Visit: Payer: Self-pay | Admitting: *Deleted

## 2016-11-04 ENCOUNTER — Other Ambulatory Visit: Payer: Self-pay | Admitting: *Deleted

## 2016-11-04 ENCOUNTER — Telehealth: Payer: Self-pay | Admitting: Family

## 2016-11-04 MED ORDER — NITROGLYCERIN 0.4 MG SL SUBL: 0.4000 mg | SUBLINGUAL_TABLET | SUBLINGUAL | 3 refills | Status: AC | PRN

## 2016-11-04 NOTE — Patient Outreach (Signed)
[facility]) Care Management   11/04/2016  [place] 1940/05/06 [id number]  Christian Morris is an 76 y.o. male   Subjective:   Objective:   BP 118/78   Pulse 67   Temp (!) 96.9 F (36.1 C) (Oral)   Resp 20   SpO2 96%   Review of Systems  Constitutional: Negative.   HENT: Negative.   Eyes: Positive for blurred vision. Negative for double vision, photophobia, pain, discharge and redness.  Respiratory: Positive for shortness of breath. Negative for cough, hemoptysis, sputum production and wheezing.        SOB with activity  Cardiovascular: Positive for chest pain. Negative for palpitations, orthopnea, claudication, leg swelling and PND.  Gastrointestinal: Positive for nausea. Negative for abdominal pain, blood in stool, constipation, diarrhea, heartburn, melena and vomiting.  Genitourinary: Negative.  Negative for dysuria, flank pain, frequency, hematuria and urgency.  Musculoskeletal: Positive for back pain. Negative for falls, joint pain, myalgias and neck pain.       Back hurts all the time from past injury  Skin: Negative.   Neurological: Positive for headaches. Negative for dizziness, tingling, tremors, sensory change, speech change, focal weakness, seizures and loss of consciousness.       Have a headache allt the time related to "my back pain"   Endo/Heme/Allergies: Negative.  Negative for environmental allergies and polydipsia. Does not bruise/bleed easily.  Psychiatric/Behavioral: Positive for memory loss. Negative for depression, hallucinations, substance abuse and suicidal ideas. The patient is nervous/anxious. The patient does not have insomnia.     Physical Exam  Constitutional: He is oriented to person, place, and time. He appears well-developed and well-nourished.  HENT:  Head: Normocephalic and atraumatic.  Eyes: Conjunctivae are normal. Pupils are equal, round, and reactive to light.  Neck: Normal range of motion. Neck supple.   Cardiovascular: Normal rate, regular rhythm and normal heart sounds.  Respiratory: Effort normal and breath sounds normal.  GI: Soft. Bowel sounds are normal.  Musculoskeletal: Normal range of motion.  Neurological: He is alert and oriented to person, place, and time.  Skin: Skin is warm and dry.  Psychiatric: He has a normal mood and affect. His behavior is normal. Judgment and thought content normal.    Encounter Medications:   Outpatient Encounter Prescriptions as of 11/04/2016  Medication Sig  . acetaminophen (TYLENOL) 500 MG tablet Take 500 mg by mouth every 6 (six) hours as needed.  [name] aspirin EC 81 MG tablet Take 81 mg by mouth every morning.   [name] atorvastatin (LIPITOR) 40 MG tablet Take 1 tablet (40 mg total) by mouth daily.  . Blood Glucose Monitoring Suppl (ONETOUCH VERIO) w/Device KIT 1 kit by Does not apply route daily. Use to check blood sugar once a day.  Dx  E11.9  . Blood Pressure Monitoring (SPHYGMOMANOMETER) MISC Use to check blood pressure daily if needed.  DX I10  . fish oil-omega-3 fatty acids 1000 MG capsule Take 2 g by mouth 2 (two) times daily.    [name] glucose blood (ONETOUCH VERIO) test strip Use to check blood sugar two times a day.  DX  E11.9  . lisinopril (PRINIVIL,ZESTRIL) 5 MG tablet Take 1 tablet (5 mg total) by mouth daily.  . metFORMIN (GLUCOPHAGE) 500 MG tablet TAKE TWO TABLETS BY MOUTH TWICE DAILY WITH FOOD  . nitroGLYCERIN (NITROSTAT) 0.4 MG SL tablet Place 1 tablet (0.4 mg total) under the tongue every 5 (five) minutes x 3 doses as needed for chest pain (donot take  with viagra).  . ONE TOUCH LANCETS MISC Use to check blood sugar twice a day.  DX E11.9  . pantoprazole (PROTONIX) 40 MG tablet Take 1 tablet (40 mg total) by mouth daily.  [name] PARoxetine (PAXIL) 30 MG tablet 1 tablet by mouth once daily  . tamsulosin (FLOMAX) 0.4 MG CAPS capsule Take 1 capsule (0.4 mg total) by mouth daily.   No facility-administered encounter medications on file as of  11/04/2016.     Functional Status:   In your present state of health, do you have any difficulty performing the following activities: 09/22/2016  Hearing? Y  Vision? Y  Difficulty concentrating or making decisions? Y  Walking or climbing stairs? N  Dressing or bathing? N  Doing errands, shopping? N  Preparing Food and eating ? N  Using the Toilet? N  In the past six months, have you accidently leaked urine? Y  Do you have problems with loss of bowel control? N  Managing your Medications? Y  Managing your Finances? N  Housekeeping or managing your Housekeeping? N  Some recent data might be hidden    Fall/Depression Screening:    Fall Risk  09/28/2016 09/22/2016 11/13/2015  Falls in the past year? No No No  Comment - - -  Number falls in past yr: - - -  Injury with Fall? - - -  Risk Factor Category  - - -  Risk for fall due to : - History of fall(s);Impaired vision -  Risk for fall due to: Comment - fell years ago wears reading glass -   PHQ 2/9 Scores 09/22/2016 09/11/2016 11/13/2015 08/14/2015 05/02/2015 02/11/2015 04/26/2014  PHQ - 2 Score 0 1 0 0 0 2 0  Exception Documentation - - - - Patient refusal - -    Assessment:   [facility] CM met with Christian Morris today at his apartment for [facility] CM 6th home visit States he feels weak and shaky but has not taken his cbg today "I was waiting on you" States he ate a roast beef sandwich    Diabetes Christian Morris encouraged to take his cbg  First attempt his glucometer provided a strip error message Second attempt cbg at 124  Reviewed of his glucometer readings to find readings of : 11/03/16 0342 94,  11/01/16 1318 158,  10/31/16 1949 185,  10/21/16  1100 121  Had flu shot at [facility] a few weeks ago but can not recall the exact date in October 2018 Continues to be [facility]  As evidence of mobile phone on the highest setting and he is noted to continue and dip his head to the left as he asks CM to repeat statements Reading glasses are being worn from a  department store. Admits to reading difficulty and vision issues when ask to read his instructions for his Protonix Noted to be breathing loudly  Chest pain Pt states he has chest pain off and on Reports chest pain since [facility] CM last visit.  Christian Morris and CM checked his nitroglycerin bottles to find 3 bottles (one expired 09/2016 and 2 that have expiration dates of 11/2016)  [facility] CM encouraged and coached Christian Morris with calling [facility] to attempt to order more NTG but found there is not a Rx for NTG.  Coached him to call pcp office to request Vladimir Faster be called with an order for NTG. Spoke with Lacinda Axon on f/u appt with pcp  11/16/16 0940 -- CM coached Christian Morris to write this on his  [place] calendar to remind him.  Reports his car is fixed and he will be able to use it to go to his  Medications- "Picked up stuff at [place] this morning" - new test strips, Paxil Reports he is taking medications as ordered and has spoken with [facility] pharmacist  Reviewed [facility] SW and pharmacist upcoming calls and coached Christian Morris to put in [facility] calendar these appointments  Eye doctor [facility] CM coached Christian Morris to call the previously found eye Dr, Rutherford Guys Noted lots of difficulty hearing the office message.  He spoke with Alyse Low next available yr Wednesday February 13 1 pm Dr Gwenevere Ghazi ask to be put on waiting list   Hearing doctor CM coached Spoke with Vira Agar at Dr Benjamine Mola office to schedule an appt for 12/07/16 at 3:30 pm   Plan:  [facility] CM called and left a message for Raquel Sarna to follow up with the status of his neurology referral appointment - left CM mobile number and Christian Morris home number- Pending response [facility] CM noted pcp office called for a referral on 1017/18  Route note to care team members Continue to collaborate with [facility] pharmacist and [place]. [name], RN, BSN, [place] Care Management [phone]

## 2016-11-04 NOTE — Telephone Encounter (Signed)
NITROGLYCERIN will expire 11/05/16 per pt please call in to [facility] (no longer [facility]).

## 2016-11-04 NOTE — Telephone Encounter (Signed)
Refill sent, notified pt. 

## 2016-11-05 ENCOUNTER — Other Ambulatory Visit: Payer: Self-pay | Admitting: Pharmacist

## 2016-11-05 ENCOUNTER — Telehealth: Payer: Self-pay | Admitting: Family

## 2016-11-05 NOTE — Patient Outreach (Signed)
[facility]) Care Management  11/05/2016  Dragan Tamburrino Mcfayden 1940/07/27 [id number]  Patient returned call from yesterday and left message.  Successful outreach to patient.    HIPAA details verified.   Patient reports he still is undecided about pill box or blister packaging of medications.  He was counseled that pill box or blister packaging may help him better see if he took his medications for the day, blister packaging may be a good option as he would need to fill pill box, but he prefers pill box.   Plan:  Home visit set up for next week to deliver pill box and review use of pill box with patient.   Karrie Meres, PharmD, [place] [phone]

## 2016-11-05 NOTE — Telephone Encounter (Signed)
Contacted patient to check on him.  He states he is doing well.  We discussed his recent issues with forgetting his appointments and memory loss.  Specifically forgetting to attend his neurology appointment for memory loss.  I asked his permission to discuss his memory loss and care with his son Elenore Rota.  He gave me permission to talk with him.  I left a message on his son's cell phone requesting a call back.

## 2016-11-09 ENCOUNTER — Encounter: Payer: Self-pay | Admitting: Physical Medicine & Rehabilitation

## 2016-11-09 ENCOUNTER — Other Ambulatory Visit: Payer: Self-pay | Admitting: Pharmacist

## 2016-11-09 ENCOUNTER — Other Ambulatory Visit: Payer: Self-pay | Admitting: *Deleted

## 2016-11-09 NOTE — Patient Outreach (Signed)
[facility] ([facility]) Care Management  [facility] CM Pharmacy   11/09/2016  Christian Morris 11/08/1940 [id number]  Subjective:  Follow-up home visit with patient to discuss medication adherence aides.  Patient selected pill box.    Patient states he doesn't wish to use pill packaging at this time.   He had three bottles of paroxetine in his possession and reports taking one tablet out of each bottle.    He was running low on metformin but reported he had another bottle in his room---he did not wish to get other metformin bottle while [facility] Pharmacist was present.   He reports he stopped his metformin "a few days" because of diarrhea and resumed it later.   He reports he does not take metformin with food.    Objective:   Encounter Medications: Outpatient Encounter Medications as of 11/09/2016  Medication Sig  . acetaminophen (TYLENOL) 500 MG tablet Take 500 mg by mouth every 6 (six) hours as needed.  . aspirin EC 81 MG tablet Take 81 mg by mouth every morning.   . atorvastatin (LIPITOR) 40 MG tablet Take 1 tablet (40 mg total) by mouth daily.  . Blood Glucose Monitoring Suppl (ONETOUCH VERIO) w/Device KIT 1 kit by Does not apply route daily. Use to check blood sugar once a day.  Dx  E11.9  . Blood Pressure Monitoring (SPHYGMOMANOMETER) MISC Use to check blood pressure daily if needed.  DX I10  . fish oil-omega-3 fatty acids 1000 MG capsule Take 2 g by mouth 2 (two) times daily.    . glucose blood (ONETOUCH VERIO) test strip Use to check blood sugar two times a day.  DX  E11.9  . lisinopril (PRINIVIL,ZESTRIL) 5 MG tablet Take 1 tablet (5 mg total) by mouth daily.  . metFORMIN (GLUCOPHAGE) 500 MG tablet TAKE TWO TABLETS BY MOUTH TWICE DAILY WITH FOOD  . nitroGLYCERIN (NITROSTAT) 0.4 MG SL tablet Place 1 tablet (0.4 mg total) under the tongue every 5 (five) minutes x 3 doses as needed for chest pain (donot take with viagra).  . ONE TOUCH LANCETS MISC Use to check blood sugar twice a  day.  DX E11.9  . pantoprazole (PROTONIX) 40 MG tablet Take 1 tablet (40 mg total) by mouth daily.  . PARoxetine (PAXIL) 30 MG tablet 1 tablet by mouth once daily  . tamsulosin (FLOMAX) 0.4 MG CAPS capsule Take 1 capsule (0.4 mg total) by mouth daily.   No facility-administered encounter medications on file as of 11/09/2016.     Functional Status: In your present state of health, do you have any difficulty performing the following activities: 09/22/2016  Hearing? Y  Vision? Y  Difficulty concentrating or making decisions? Y  Walking or climbing stairs? N  Dressing or bathing? N  Doing errands, shopping? N  Preparing Food and eating ? N  Using the Toilet? N  In the past six months, have you accidently leaked urine? Y  Do you have problems with loss of bowel control? N  Managing your Medications? Y  Managing your Finances? N  Housekeeping or managing your Housekeeping? N  Some recent data might be hidden    Fall/Depression Screening: Fall Risk  09/28/2016 09/22/2016 11/13/2015  Falls in the past year? No No No  Comment - - -  Number falls in past yr: - - -  Injury with Fall? - - -  Risk Factor Category  - - -  Risk for fall due to : - History of fall(s);Impaired vision -    Risk for fall due to: Comment - fell years ago wears reading glass -   PHQ 2/9 Scores 09/22/2016 09/11/2016 11/13/2015 08/14/2015 05/02/2015 02/11/2015 04/26/2014  PHQ - 2 Score 0 1 0 0 0 2 0  Exception Documentation - - - - Patient refusal - -      Assessment:  Medication adherence:  Provided patient with a 7 day pillbox.  Discussed with patient how to fill pill box---patient filled pill box for Tuesday-Saturday.   Patient did appear to struggle with filling pill box.  He was counseled to fill one medication at a time, read the direction of the bottle, and look at each drug name on bottle.   He was counseled [facility] Pharmacist will not be able to routinely fill pill box and medication blister packaging may be a better  option for patient but he continues to decline this.    Patient counseling:  Patient counseled to take metformin with food to reduce risk of GI side effects and to contact his prescriber if he experiences GI side effects with metformin.   He was counseled to not take extra medication---per his reports he may have taken up to 90 mg of paroxetine if he took one tablet from each of three bottles.  The three bottles were in date, the same strength, and same directions, and were combined into one bottle.    Plan:  Will route note to PCP as update.   Will route note to [facility] RN.   Will place follow-up call to patient in the next 2 weeks.   [facility] CM Care Plan Problem One     Most Recent Value  Care Plan Problem One  Medication management  Role Documenting the Problem One  Clinical Pharmacist  Care Plan for Problem One  Active  [facility] CM Short Term Goal #1   Patient to take 80% of his metformin doses over the next 2 weeks  [facility] CM Short Term Goal #1 Start Date  11/09/16  Interventions for Short Term Goal #1  reviewed with patient how to fill pill box       [name], PharmD, BCACP Clinical Pharmacist [facility] [phone]  

## 2016-11-10 ENCOUNTER — Telehealth: Payer: Self-pay | Admitting: Family

## 2016-11-10 NOTE — Telephone Encounter (Signed)
I did contact patient's son Elenore Rota and let him know that I am concerned about his dad's memory and missing important appointments. Asked him if he may be able to help accompany his dad to his appointments and he told me that he would not due to his work schedule.  Reports that he is not very close to his dad- but does call him daily. States that patient is not very close to his family and was unable to identify a family member who he felt would able or willing to help with his Dad.

## 2016-11-10 NOTE — Patient Outreach (Signed)
[facility]) Care Management  11/10/2016  Christian Morris 03-05-1940 [id number]   CSW called & spoke with patient to discuss community resources and issues regarding missing his appointments. CSW reviewed chart and noted that he had missed his neurology appointment for memory loss, patient states that he usually drives himself to appointments but has a friend that is going to take him to his appointment with his PCP - [name], NP on Monday at 9:30. CSW again spoke with patient about housing options - moving to an assisted or independent living but patient declined stating that he is happy where he is. CSW also spoke with patient about going to [employer] to get out of his apartment for socialization, patient also declined stating that he gets out when he needs to - like grocery shopping and feels that that is enough.   CSW will check back with patient in 1 month.    Raynaldo Opitz, LCSW [facility]  Clinical Social Worker cell #: [phone]

## 2016-11-16 ENCOUNTER — Encounter: Payer: Self-pay | Admitting: Family

## 2016-11-16 ENCOUNTER — Ambulatory Visit: Payer: PPO | Admitting: Family

## 2016-11-16 VITALS — BP 137/74 | HR 64 | Temp 98.6°F | Resp 18 | Ht 72.0 in | Wt 262.4 lb

## 2016-11-16 DIAGNOSIS — K219 Gastro-esophageal reflux disease without esophagitis: Secondary | ICD-10-CM

## 2016-11-16 DIAGNOSIS — I10 Essential (primary) hypertension: Secondary | ICD-10-CM

## 2016-11-16 DIAGNOSIS — E119 Type 2 diabetes mellitus without complications: Secondary | ICD-10-CM | POA: Diagnosis not present

## 2016-11-16 DIAGNOSIS — F341 Dysthymic disorder: Secondary | ICD-10-CM

## 2016-11-16 DIAGNOSIS — R413 Other amnesia: Secondary | ICD-10-CM

## 2016-11-16 DIAGNOSIS — E785 Hyperlipidemia, unspecified: Secondary | ICD-10-CM | POA: Diagnosis not present

## 2016-11-16 LAB — HEPATIC FUNCTION PANEL
ALT: 15 U/L (ref 0–53)
AST: 16 U/L (ref 0–37)
Albumin: 4.7 g/dL (ref 3.5–5.2)
Alkaline Phosphatase: 62 U/L (ref 39–117)
BILIRUBIN DIRECT: 0.2 mg/dL (ref 0.0–0.3)
TOTAL PROTEIN: 7.7 g/dL (ref 6.0–8.3)
Total Bilirubin: 1 mg/dL (ref 0.2–1.2)

## 2016-11-16 LAB — LIPID PANEL
CHOLESTEROL: 175 mg/dL (ref 0–200)
HDL: 39.4 mg/dL (ref >39.00)
NonHDL: 135.47
Total CHOL/HDL Ratio: 4
Triglycerides: 273 mg/dL — ABNORMAL HIGH (ref 0.0–149.0)
VLDL: 54.6 mg/dL — AB (ref 0.0–40.0)

## 2016-11-16 LAB — BASIC METABOLIC PANEL
BUN: 19 mg/dL (ref 6–23)
CALCIUM: 10.3 mg/dL (ref 8.4–10.5)
CO2: 27 mEq/L (ref 19–32)
Chloride: 100 mEq/L (ref 96–112)
Creatinine, Ser: 1.12 mg/dL (ref 0.40–1.50)
GFR: 67.36 mL/min (ref >60.00)
GLUCOSE: 128 mg/dL — AB (ref 70–99)
POTASSIUM: 4.8 meq/L (ref 3.5–5.1)
Sodium: 138 mEq/L (ref 135–145)

## 2016-11-16 LAB — LDL CHOLESTEROL, DIRECT: LDL DIRECT: 91 mg/dL

## 2016-11-16 LAB — HEMOGLOBIN A1C: HEMOGLOBIN A1C: 6.6 % — AB (ref 4.6–6.5)

## 2016-11-16 MED ORDER — METFORMIN HCL 500 MG PO TABS: ORAL_TABLET | ORAL | 5 refills | Status: AC

## 2016-11-16 MED ORDER — PAROXETINE HCL 30 MG PO TABS: ORAL_TABLET | ORAL | 5 refills | Status: AC

## 2016-11-16 MED ORDER — PANTOPRAZOLE SODIUM 40 MG PO TBEC: 40.0000 mg | DELAYED_RELEASE_TABLET | Freq: Every day | ORAL | 5 refills | Status: AC

## 2016-11-16 MED ORDER — TAMSULOSIN HCL 0.4 MG PO CAPS
0.4000 mg | ORAL_CAPSULE | Freq: Every day | ORAL | 5 refills | Status: AC
Start: 2016-11-16 — End: ?

## 2016-11-16 MED ORDER — ATORVASTATIN CALCIUM 40 MG PO TABS: 40.0000 mg | ORAL_TABLET | Freq: Every day | ORAL | 5 refills | Status: AC

## 2016-11-16 NOTE — Assessment & Plan Note (Signed)
Stable on Protonix continue same.

## 2016-11-16 NOTE — Patient Instructions (Addendum)
Please schedule your annual diabetic eye exam at [facility] with Karl Luke, [phone].  Please complete lab work prior to leaving.

## 2016-11-16 NOTE — Assessment & Plan Note (Signed)
Stable on Paxil continue same.

## 2016-11-16 NOTE — Assessment & Plan Note (Signed)
Blood pressure is stable on current medication continue same.

## 2016-11-16 NOTE — Progress Notes (Signed)
Subjective:    Patient ID: Christian Morris, male    DOB: 1940-06-06, 76 y.o.   MRN: [id number]  HPI  Christian Morris is a 76 yr old male who presents today for follow up.  1) DM2- maintained on metformin. Reports sugars around 120 at home.  Lab Results  Component Value Date   HGBA1C 6.4 07/13/2016   HGBA1C 6.3 11/13/2015   HGBA1C 6.5 08/14/2015   Lab Results  Component Value Date   MICROALBUR 18.6 (H) 07/13/2016   LDLCALC 66 08/17/2014   CREATININE 1.32 (H) 08/16/2016   2) HTN- maintained on lisinopril 61m.  BP Readings from Last 3 Encounters:  11/16/16 137/74  11/04/16 118/78  10/21/16 130/88   3) Anxiety/depression- maintained on paxil.  Reports mood is good, denies anxiety symptoms.   4) GERD- maintained on PPI. Denies gerd symptoms.  5) Hyperlipidemia-  Lab Results  Component Value Date   CHOL 196 07/13/2016   HDL 38.30 (L) 07/13/2016   LDLCALC 66 08/17/2014   LDLDIRECT 103.0 07/13/2016   TRIG 406.0 (H) 07/13/2016   CHOLHDL 5 07/13/2016    Review of Systems    see HPI  Past Medical History:  Diagnosis Date  . Anxiety   . Arthritis    "legs" (02/21/2014)  . Chronic lower back pain   . Daily headache    "here lately" (02/21/2014)  . Depression   . Fatty liver disease, nonalcoholic   . GERD (gastroesophageal reflux disease)   . History of blood transfusion ~ 1958   "related to bleeding ulcers"  . History of gastric ulcer ~ 1958   no recurrence since.  . Hyperlipidemia   . Hypertension   . Hypogonadism male 01/11/2011  . Kidney stones   . Lung nodule    right 16 mm, seen initially 6/12. 157min 08/2011.  . OSA on CPAP   . Squamous cell cancer of skin of forearm 01/23/2011   left  . Type II diabetes mellitus ([place]  . Urinary hesitancy   . Vitamin D deficiency 11/19/2014     Social History   Socioeconomic History  . Marital status: Divorced    Spouse name: Not on file  . Number of children: 3  . Years of education: Not on file  . Highest education  level: Not on file  Social Needs  . Financial resource strain: Not on file  . Food insecurity - worry: Not on file  . Food insecurity - inability: Not on file  . Transportation needs - medical: Not on file  . Transportation needs - non-medical: Not on file  Occupational History  . Occupation: RETIRED BUS DRIVER    Employer: RETIRED  Tobacco Use  . Smoking status: Former Smoker    Packs/day: 1.00    Years: 25.00    Pack years: 25.00    Types: Cigarettes    Last attempt to quit: 01/05/1982    Years since quitting: 34.8  . Smokeless tobacco: Never Used  Substance and Sexual Activity  . Alcohol use: Yes    Alcohol/week: 0.0 oz    Comment: "quit drinking in the 1980's"  . Drug use: No  . Sexual activity: Yes  Other Topics Concern  . Not on file  Social History Narrative   Regular exercise:  Yes   [place] [place]    Past Surgical History:  Procedure Laterality Date  . CARDIAC CATHETERIZATION    . CATARACT EXTRACTION W/ INTRAOCULAR LENS  IMPLANT Left 08/2009   Dr Katy Fitch  . MELANOMA EXCISION Left 01/23/2011   forearm    Family History  Problem Relation Age of Onset  . Arthritis Mother   . Heart disease Sister        Massive MI age 58.  . Arrhythmia Brother   . Melanoma Son   . Heart attack Brother     No Known Allergies  Current Outpatient Medications on File Prior to Visit  Medication Sig Dispense Refill  . acetaminophen (TYLENOL) 500 MG tablet Take 500 mg by mouth every 6 (six) hours as needed.    [name] aspirin EC 81 MG tablet Take 81 mg by mouth every morning.     [name] atorvastatin (LIPITOR) 40 MG tablet Take 1 tablet (40 mg total) by mouth daily. 30 tablet 5  . Blood Glucose Monitoring Suppl (ONETOUCH VERIO) w/Device KIT 1 kit by Does not apply route daily. Use to check blood sugar once a day.  Dx  E11.9 1 kit 0  . Blood Pressure Monitoring (SPHYGMOMANOMETER) MISC Use to check blood pressure daily if needed.  DX I10 1 each 0  . fish  oil-omega-3 fatty acids 1000 MG capsule Take 2 g by mouth 2 (two) times daily.      [name] glucose blood (ONETOUCH VERIO) test strip Use to check blood sugar two times a day.  DX  E11.9 100 each 5  . lisinopril (PRINIVIL,ZESTRIL) 5 MG tablet Take 1 tablet (5 mg total) by mouth daily. 30 tablet 5  . metFORMIN (GLUCOPHAGE) 500 MG tablet TAKE TWO TABLETS BY MOUTH TWICE DAILY WITH FOOD 120 tablet 5  . nitroGLYCERIN (NITROSTAT) 0.4 MG SL tablet Place 1 tablet (0.4 mg total) under the tongue every 5 (five) minutes x 3 doses as needed for chest pain (donot take with viagra). 60 tablet 3  . ONE TOUCH LANCETS MISC Use to check blood sugar twice a day.  DX E11.9 100 each 5  . pantoprazole (PROTONIX) 40 MG tablet Take 1 tablet (40 mg total) by mouth daily. 30 tablet 5  . PARoxetine (PAXIL) 30 MG tablet 1 tablet by mouth once daily 30 tablet 5  . tamsulosin (FLOMAX) 0.4 MG CAPS capsule Take 1 capsule (0.4 mg total) by mouth daily. 30 capsule 5   No current facility-administered medications on file prior to visit.     BP 137/74 (BP Location: Right Arm, Cuff Size: Large)   Pulse 64   Temp 98.6 F (37 C) (Oral)   Resp 18   Ht 6' (1.829 m)   Wt 262 lb 6.4 oz (119 kg)   SpO2 98%   BMI 35.59 kg/m    Objective:   Physical Exam  Constitutional: He is oriented to person, place, and time. He appears well-developed and well-nourished. No distress.  HENT:  Head: Normocephalic and atraumatic.  Cardiovascular: Normal rate and regular rhythm.  No murmur heard. Pulmonary/Chest: Effort normal and breath sounds normal. No respiratory distress. He has no wheezes. He has no rales.  Musculoskeletal: He exhibits no edema.  Neurological: He is alert and oriented to person, place, and time.  Skin: Skin is warm and dry.  Psychiatric: He has a normal mood and affect. His behavior is normal. Thought content normal.          Assessment & Plan:  Memory loss- he does not feel that this is an issue, however he has  forgotten to go to his last 2 appointments we have arranged for him.  He is  agreeable to go but request a neurologist in [place].  Will arrange.

## 2016-11-16 NOTE — Assessment & Plan Note (Signed)
Clinically stable on metformin.  Will check follow-up basic metabolic panel to assess creatinine as well as a follow-up A1c.  Continue metformin.

## 2016-11-18 ENCOUNTER — Other Ambulatory Visit: Payer: Self-pay | Admitting: *Deleted

## 2016-11-18 ENCOUNTER — Telehealth: Payer: Self-pay | Admitting: Family

## 2016-11-18 ENCOUNTER — Other Ambulatory Visit: Payer: Self-pay

## 2016-11-18 NOTE — Telephone Encounter (Signed)
Spoke with Christian Morris. She states she has spoken with the neurologists office and they have scheduled pt with Dr Merlene Laughter on 12/08/16 at 11:30am.

## 2016-11-18 NOTE — Telephone Encounter (Signed)
Great.  Thanks

## 2016-11-18 NOTE — Telephone Encounter (Signed)
Kim at [facility] called in fu from pt appt 11/04/16 .  Maudie Mercury has made pt EYE APPT scheduled 02/17/17 at 1:00 pm at dr Shippiro's office with Dr Ronna Polio.  Dr Clista Bernhardt the hearing dr he has an appt with on 12/07/16 at 3:30 pm. Maudie Mercury wants to be able to go with pt to neurology appt when he goes. Call Maudie Mercury if there is any other appts we need help coordinating for pt.

## 2016-11-18 NOTE — Patient Outreach (Signed)
[facility]) Care Management   11/18/2016  [place] 1940-10-12 [id number]  JERED HEINY is an 76 y.o. male who has been followed by [place] since th first home visit on 09/22/16 after a referral from pcp for further in home evaluation/assessment of care needs chronic medical management. Pt has a history of confusion, per MD. His caregiver support has limited ability to assist patient.  PMH Diabetes type 2, anxiety, depression, sleep apnea, COPD, hypertension, hyperlipidemia, lung nodule  He has had no admission but one ED visit in the last 6 months.  Subjective: see below notes   Objective:   BP 124/68   Pulse 60   Temp (!) 97.3 F (36.3 C) (Oral)   Resp 20   SpO2 96%  Review of Systems  Constitutional: Negative for chills, diaphoresis, fever, malaise/fatigue and weight loss.  HENT: Positive for ear pain and hearing loss. Negative for congestion, ear discharge, nosebleeds, sinus pain, sore throat and tinnitus.        Ear pain at intervals for "a few seconds but goes away when I press on my ear" " I get a build up of wax I guess you call it"  Eyes: Positive for pain. Negative for blurred vision, double vision, photophobia, discharge and redness.       "sharp pain" "goes away" " I don't do anything but maybe close my eyes and it goes away"  Eyes itch or water if I "strain looking at something"  Respiratory: Positive for cough and wheezing. Negative for hemoptysis, sputum production, shortness of breath and stridor.        Cough "every now and then"  Increased breathing being heard during this home visit but he states "this I my normal breathing"  Cardiovascular: Negative for chest pain, palpitations, orthopnea, claudication, leg swelling and PND.  Gastrointestinal: Negative for abdominal pain, blood in stool, constipation, diarrhea, heartburn, melena, nausea and vomiting.  Genitourinary: Negative for dysuria, flank pain, frequency, hematuria and  urgency.  Musculoskeletal: Positive for back pain and neck pain. Negative for falls, joint pain and myalgias.  Skin: Negative.  Negative for itching and rash.  Neurological: Positive for headaches. Negative for tingling, tremors, sensory change, speech change, focal weakness, seizures, loss of consciousness and weakness.  Endo/Heme/Allergies: Negative for environmental allergies and polydipsia. Does not bruise/bleed easily.  Psychiatric/Behavioral: Positive for memory loss. Negative for depression, hallucinations, substance abuse and suicidal ideas. The patient is not nervous/anxious and does not have insomnia.     Physical Exam  Constitutional: He is oriented to person, place, and time. He appears well-developed and well-nourished.  HENT:  Head: Normocephalic and atraumatic.  Right Ear: External ear normal.  Left Ear: External ear normal.  Eyes: Conjunctivae are normal. Pupils are equal, round, and reactive to light.  Neck: Normal range of motion. Neck supple.  Cardiovascular: Normal rate, regular rhythm, normal heart sounds and intact distal pulses.  Respiratory: Effort normal and breath sounds normal.  GI: Bowel sounds are normal.  Musculoskeletal: Normal range of motion.  Neurological: He is alert and oriented to person, place, and time.  Skin: Skin is warm and dry.  Psychiatric: He has a normal mood and affect. His behavior is normal. Judgment and thought content normal.    Encounter Medications:   Outpatient Encounter Medications as of 11/18/2016  Medication Sig  . acetaminophen (TYLENOL) 500 MG tablet Take 500 mg by mouth every 6 (six) hours as needed.  [name] aspirin EC 81 MG tablet Take  81 mg by mouth every morning.   [name] atorvastatin (LIPITOR) 40 MG tablet Take 1 tablet (40 mg total) daily by mouth.  . Blood Glucose Monitoring Suppl (ONETOUCH VERIO) w/Device KIT 1 kit by Does not apply route daily. Use to check blood sugar once a day.  Dx  E11.9  . Blood Pressure Monitoring  (SPHYGMOMANOMETER) MISC Use to check blood pressure daily if needed.  DX I10  . fish oil-omega-3 fatty acids 1000 MG capsule Take 2 g by mouth 2 (two) times daily.    [name] glucose blood (ONETOUCH VERIO) test strip Use to check blood sugar two times a day.  DX  E11.9  . lisinopril (PRINIVIL,ZESTRIL) 5 MG tablet Take 1 tablet (5 mg total) by mouth daily.  . metFORMIN (GLUCOPHAGE) 500 MG tablet TAKE TWO TABLETS BY MOUTH TWICE DAILY WITH FOOD  . nitroGLYCERIN (NITROSTAT) 0.4 MG SL tablet Place 1 tablet (0.4 mg total) under the tongue every 5 (five) minutes x 3 doses as needed for chest pain (donot take with viagra).  . ONE TOUCH LANCETS MISC Use to check blood sugar twice a day.  DX E11.9  . pantoprazole (PROTONIX) 40 MG tablet Take 1 tablet (40 mg total) daily by mouth.  [name] PARoxetine (PAXIL) 30 MG tablet 1 tablet by mouth once daily  . tamsulosin (FLOMAX) 0.4 MG CAPS capsule Take 1 capsule (0.4 mg total) daily by mouth.   No facility-administered encounter medications on file as of 11/18/2016.     Functional Status:   In your present state of health, do you have any difficulty performing the following activities: 09/22/2016  Hearing? Y  Vision? Y  Difficulty concentrating or making decisions? Y  Walking or climbing stairs? N  Dressing or bathing? N  Doing errands, shopping? N  Preparing Food and eating ? N  Using the Toilet? N  In the past six months, have you accidently leaked urine? Y  Do you have problems with loss of bowel control? N  Managing your Medications? Y  Managing your Finances? N  Housekeeping or managing your Housekeeping? N  Some recent data might be hidden    Fall/Depression Screening:    Fall Risk  11/18/2016 09/28/2016 09/22/2016  Falls in the past year? No No No  Comment - - -  Number falls in past yr: - - -  Injury with Fall? - - -  Risk Factor Category  - - -  Risk for fall due to : - - History of fall(s);Impaired vision  Risk for fall due to: Comment - - fell  years ago wears reading glass   PHQ 2/9 Scores 09/22/2016 09/11/2016 11/13/2015 08/14/2015 05/02/2015 02/11/2015 04/26/2014  PHQ - 2 Score 0 1 0 0 0 2 0  Exception Documentation - - - - Patient refusal - -    Assessment:   This is the  7th [facility] CM home visit (some have been unscheduled) to see Mr Oien since 09/22/16  Memory Cm met Mr Mcqueen at his home. He reports he had not remembered that CM was to visit even after he and CM wrote in his [facility] calendar, appointment was placed on his 11/16/16 pcp after summary sheets and he received a call about the appointment.  Informed Cm he had just made it home prior to CM arrival to his home and had not checked his mobile phone for a voice message left today by CM. Spoke with him about not remembering does not mean "I'm crazy"  Medical symptoms reported during  home visit today. Reports he keeps a "headache and a lot of tension in my neck",  "I don't take anything for it.  I don't like to take a lot of stuff." "Could be from my injury in second grade." "never did bother me until I got old" His pcp has ordered for pain management with the first rehab session with Dr Posey Pronto on 12/18/16  He had a 11/16/16 appointment with pcp but did not mention his allergy symptoms CM had encouraged him to discuss during last CM home visit - See ROS results above  Diabetes A1c result from 11/01/07/16 pcp visit to = 6.6 and was 6.4 07/13/16 He admits to CM that he has not checked his cbg in about a week glucometer.  Last cbg on glucometer was taken on November 05 2016 as 176 at 1256, then 173 at 0100, and at then 221 on November 11 2016 at 0332 (These times for cbgs are errors.  CM had to set the correct time on his glucometer during this home visit) After CM request Mr Wheeling took his cbg today after 3 tries cbg= 162 CM had him to write it in his [facility] calendar Noted him having issues seeing how to write in his calendar while sitting in darker living room area.  Reminded him to change glucometer  needles with each cbg stick   Social His car has been broke down "had to get it tuned up and wire on it. It is running pretty good now"  Voiced that he is afraid to answer calls for scam calls noted during this home visit. Received a scam call during home visit.   Discussed noted in Epic from pcp with her attempt to get son, Elenore Rota, to assist with getting him to appointments. Mr Lui again confirms with CM that his son drives trucks, his daughter, Mariann Laster, works "twelve plus hours" and are not able to go with him to appointments. He has a friend that may be able to assist but Mr Sciuto informed CM " I don't like using people"   Medications Has obtained his new bottle of NTG Denies chest pain in "years" With review of his medication bottles Cm found he had medications located in different locations of his apartment (in a black bag, in [employer] bags on his refrigerator and at his bedside.  CM combined bottles of Lipitor and Flomax to hopefully prevent him from filling is pill box with more medications than needed after noting [facility] pharmacist noted Mr Owensby had been taken a pill out of each bottle to put in his pill box. Cm also concern that he may not be taking medications as ordered but Mr Ottaway denies need for use of pre packaging/blister pack medications  Hearing Mr Markarian continues to lean toward CM when she is speaking with him and CM has to repeat statements and talk in a loud tone He frequently says "What did you say/" when speaking with CM and staff from pcp and neurology office when speaker phone option used.  Plan:  To coordinate a new neurology appointment for Mr Cella (after 2 prior appointments missed) and attend the first neurology appointment with Mr Aprea Follow up visit for this will be in 3-4 weeks  Spoke with Angela Nevin at pcp office to check on status of Neurology referral. She confirmed referral made to [facility] neurology pending. Cm request her to have a note entered in pcp office to inform  pcp of upcoming hearing (December 07 2016 at [address]  Teoh office) and eye appointments (February 17, 2017 at Dr Gwenevere Ghazi office) for Mr Rico Ala  Spoke with Raquel Sarna at [facility] neurology to coordinate an appointment for 12/08/16 at 81- A new patient package to be sent to Mr Casillas and he should arrive 30 minutes early - CM will attend this appointment with him if no conflicts   Entered all these dated on printed out calendars for December 2018 and February 2019 These were reviewed with him and posted on his refrigerator.  Sent SW in basket message to request assist with transportation to appointments as needed  Counseled on medication adherence  Educated on hypertension and hyperlipidemia related to Diabetes.  Collaborated with Carlyle Basques, RN at pcp office about upcoming appointments especially for neurology Route note to care team members  [facility] CM Care Plan Problem One     Most Recent Value  Care Plan Problem One  correct administration of correct medications at correct dosages  Role Documenting the Problem One  Care Management [place] for Problem One  Active  [facility] Long Term Goal   over the  next 60 days patient will be administering/taking the correct dosage of medications at the correct time  [facility] Long Term Goal Start Date  09/22/16  Interventions for Problem One Long Term Goal  review and re-review medications, pharmacy consult, consult pcp and pharmacy as needed, discuss possible blister/bubble packing services  [facility] CM Short Term Goal #1   over the next 30 days patient will have medications reconciled and begin take medications as ordered   [facility] CM Short Term Goal #1 Start Date  09/22/16  [facility] CM Short Term Goal #1 Met Date  11/04/16  Interventions for Short Term Goal #1  review and re-review medications, pharmacy consult, consult pcp and pharmacy as needed, discuss possible blister/bubble packing services    [facility] CM Care Plan Problem Two     Most Recent Value  Care Plan  Problem Two  Managment of diabetes at home to include medications, monitoring and MD follow up care   Role Documenting the Problem Two  Care Management Coordinator  Care Plan for Problem Two  Active  Interventions for Problem Two Long Term Goal   Assess knowledge of DM, Assist to get glucometer, Educate on use of glucometer and documenting results, Educated on A1c, Educate on DM medications, Blister/bubble packing if needed,, educate on follow up care for DM (eyes, feet, etc)  [facility] Long Term Goal  over the next 60 days patient will be able to verbalize and voice understanding of management of diabetes at home  [place] Term Goal Start Date  09/22/16  [facility] CM Short Term Goal #1   In the next 30 days patient will receive a glucometer and document his blood sugars as ordered as evidence by documentation in his new [facility] calendar DM section  [facility] CM Short Term Goal #1 Start Date  09/22/16  [facility] CM Short Term Goal #1 Met Date   11/04/16  Interventions for Short Term Goal #2   Assess knowledge of DM, Assist to get glucometer, Educate on DM diet, Educate on use of glucometer and documenting results, Educated on A1c, Educate on DM medications, Blister/bubble packing if needed,, educate on follow up care for DM (eyes, feet, etc)    [facility] CM Care Plan Problem Three     Most Recent Value  Care Plan for Problem Three  Active  [facility] Long Term Goal   Over the next 90 days Patient will  attend pcp and specialist appointments as scheduled  [facility] Long Term Goal Start Date  11/18/16  Interventions for Problem Three Long Term Goal  coordinate appointments for neurology, ENT and ophthalmology  [facility] CM Short Term Goal #1   over the next 30 days patient will remember and to go to scheduled appointments  [facility] CM Short Term Goal #1 Start Date  11/18/16  Interventions for Short Term Goal #1  assess needs to get to appointments, collaborate with [facility] SW for possible need for transportation to appointments (in basket msg)collaborate with  pcp & specilist office staff      Joelene Millin L. [name], RN, BSN, [place] Care Management [phone]

## 2016-11-19 ENCOUNTER — Encounter: Payer: Self-pay | Admitting: Family

## 2016-11-20 ENCOUNTER — Other Ambulatory Visit: Payer: Self-pay | Admitting: *Deleted

## 2016-11-20 NOTE — Patient Outreach (Signed)
[place] [facility]) Care Management  11/20/2016  [place] 09-10-1940 [id number]   Care coordination  Mr Miera called CM to ask about upcoming appointments  CM referred him to the calendar on his refrigerator and the [facility] calendar where all the information CM had reviewed with him on 11/18/16 was All the answers to the question he had was written on the both calendars except the telephone numbers in which CM provided to him  Plans To follow up with Mr Rash in 3 weeks for neurology appointment   Joelene Millin L. [name], RN, BSN, [place] Care Management [phone]

## 2016-11-23 ENCOUNTER — Other Ambulatory Visit: Payer: Self-pay | Admitting: Pharmacist

## 2016-11-23 ENCOUNTER — Ambulatory Visit: Payer: Self-pay | Admitting: Pharmacist

## 2016-11-23 NOTE — Patient Outreach (Signed)
[place] [facility]) Care Management  11/23/2016  Christian Morris 06/12/1940 [id number]  Successful phone follow-up to patient.  Patient reports he has been doing fine with pillbox.   Briefly mentioned blister packaging to patient---he claims no one mentioned this to him.  Reminded patient [facility] Pharmacist has mentioned it to patient multiple times in the past, most recently 11/09/16 home visit and [facility] RN also mentioned per 11/18/16 note.    Patient continues to decline blister packaging of medications.  When patient was asked what [facility] Pharmacist can do to further assist him, he reports nothing at this time.   Continued concerns with patient's medication management, but patient is declining blister packaging/adherence packaging.    Plan:  Will close pharmacy episode at this time.    Will route note to PCP.   Will update [facility] RN.    Karrie Meres, PharmD, [place] [phone]

## 2016-12-01 ENCOUNTER — Other Ambulatory Visit: Payer: Self-pay | Admitting: *Deleted

## 2016-12-01 NOTE — Patient Outreach (Signed)
[place] [facility]) Care Management  12/01/2016  Christian Morris 02-May-1940 [id number]   CSW attempted to reach patient to remind him of his appointments next week, 12/3 for a hearing test with Dr. Benjamine Mola & 12/4 at Neurology [facility] with Dr. Marvel Plan but no answer. CSW left voicemail to remind him of appointments and requested a call back to confirm message was received. CSW will try again in 2 days if no response.    Raynaldo Opitz, LCSW [facility]  Clinical Social Worker cell #: [phone]

## 2016-12-03 ENCOUNTER — Other Ambulatory Visit: Payer: Self-pay | Admitting: *Deleted

## 2016-12-03 NOTE — Patient Outreach (Signed)
[place] [facility]) Care Management  12/03/2016  Aniceto Kyser [place] October 04, 1940 [id number]   Care coordination  [facility] CM called Mr Dicesare after a call from [facility] SW stating Mr Ruggirello wants to change his appointments related to his sister in law may be passing [facility] CM called Mr Jimmerson.  Inquired if forms from the neurology office has arrived and he informed CM he had not received no patient forms Mr Dake reports his brother, Clinton's wife is "dying in a place in [place] Brother wife had a stroke in [place] went  Over today broghter not able to get around visited her  Discussed how he f I feel sorry for her and my brother "he can't get around good.  He is older than I am" They live other here but I never see them. I have not seen them in many many years." "I talk to them on the phone only I don't visit because of their wives"  Explained that because his pcp want him to see specialist does not Had him go to his refrigerator to read the appointments  3 brother "Im not crazy I know what I am doing at all times"  What Lenna Sciara got you foing this for "it is because everybody thinks I'm crazy    Plans To see Mr Carnero for his appointments on next    [place]. [name], RN, BSN, [place] Care Management [phone]

## 2016-12-03 NOTE — Patient Outreach (Signed)
[place] [facility]) Care Management  12/03/2016  [place] 1940/12/08 [id number]   CSW called & spoke with patient to confirm that he is still planning on going to his appointment this afternoon for a hearing test with Dr. Benjamine Mola & tomorrow at Neurology [facility]. Patient is aware that [facility] RNCM, Maudie Mercury is planning to meet him at the office for both appointments. Patient states that he was thinking about cancelling the appointments as his brother's wife is in hospice home in [place] after having a stroke but patient states that he spoke with Maudie Mercury and has decided to go ahead with the appointments.   CSW attempted to speak with patient about going to the senior center for socialization, but patient ended the call stating that he needed to go. CSW will try again in 2 weeks.    Raynaldo Opitz, LCSW [facility]  Clinical Social Worker cell #: [phone]

## 2016-12-07 ENCOUNTER — Other Ambulatory Visit: Payer: Self-pay | Admitting: *Deleted

## 2016-12-07 ENCOUNTER — Ambulatory Visit (INDEPENDENT_AMBULATORY_CARE_PROVIDER_SITE_OTHER): Payer: PPO | Admitting: Otolaryngology

## 2016-12-07 DIAGNOSIS — H903 Sensorineural hearing loss, bilateral: Secondary | ICD-10-CM

## 2016-12-07 NOTE — Patient Outreach (Signed)
[place] [facility]) Care Management  12/07/2016  Christian Morris 02-Jul-1940 [id number]  Care coordination  Mr Ki called [facility] CM to inquiry where CM was.  "I thought you told me you would be to my house at nine o'clock"  CM informed Mr Davidian CM has never informed him she would be to his home on today 12/07/16 at 0900 CM reminded Mr Portugal that CM had repeated over 20 times during the last call with him, had him to write down the times of his appointments this week, had written the times & dates on his calendar on his refrigerator and in his [place]. CM reminded him that his appointment today December 07 2016 is at 3:30 pm and that CM again would be to his home at # pm to assist him to get to the correct MD office "[place] whatever.  I will see you when you get here" Mr Freeman informed Cm  Plans To follow up with Mr Hamil later in the day for his hearing appointment  Joelene Millin L. [name], RN, BSN, [place] Care Management [phone]

## 2016-12-07 NOTE — Patient Outreach (Signed)
[facility] Care Management   12/07/2016  [place] Dec 30, 1940 [id number]  Christian Morris is an 76 y.o. male  Subjective: see notes below  Objective:   Review of Systems  HENT: Positive for hearing loss.   Eyes: Positive for double vision.  Respiratory: Negative.   Cardiovascular: Negative.   Gastrointestinal: Negative.   Genitourinary: Negative.   Musculoskeletal: Positive for joint pain.  Skin: Negative.   Neurological: Positive for weakness. Negative for dizziness, tingling, tremors, sensory change, speech change, focal weakness, seizures, loss of consciousness and headaches.  Endo/Heme/Allergies: Negative.   Psychiatric/Behavioral: The patient is nervous/anxious.     Physical Exam  Constitutional: He is oriented to person, place, and time. He appears well-developed and well-nourished.  HENT:  Head: Atraumatic.  Eyes: Conjunctivae are normal. Pupils are equal, round, and reactive to light.  Neck: Normal range of motion. Neck supple.  Cardiovascular: Normal rate.  Respiratory: Effort normal.  GI: Soft.  Musculoskeletal: Normal range of motion.  Neurological: He is alert and oriented to person, place, and time.  Skin: Skin is warm and dry.    Encounter Medications:   Outpatient Encounter Medications as of 12/07/2016  Medication Sig  . acetaminophen (TYLENOL) 500 MG tablet Take 500 mg by mouth every 6 (six) hours as needed.  [name] aspirin EC 81 MG tablet Take 81 mg by mouth every morning.   [name] atorvastatin (LIPITOR) 40 MG tablet Take 1 tablet (40 mg total) daily by mouth.  . Blood Glucose Monitoring Suppl (ONETOUCH VERIO) w/Device KIT 1 kit by Does not apply route daily. Use to check blood sugar once a day.  Dx  E11.9  . Blood Pressure Monitoring (SPHYGMOMANOMETER) MISC Use to check blood pressure daily if needed.  DX I10  . fish oil-omega-3 fatty acids 1000 MG capsule Take 2 g by mouth 2 (two) times daily.    [name] glucose blood (ONETOUCH VERIO) test  strip Use to check blood sugar two times a day.  DX  E11.9  . lisinopril (PRINIVIL,ZESTRIL) 5 MG tablet Take 1 tablet (5 mg total) by mouth daily.  . metFORMIN (GLUCOPHAGE) 500 MG tablet TAKE TWO TABLETS BY MOUTH TWICE DAILY WITH FOOD  . nitroGLYCERIN (NITROSTAT) 0.4 MG SL tablet Place 1 tablet (0.4 mg total) under the tongue every 5 (five) minutes x 3 doses as needed for chest pain (donot take with viagra).  . ONE TOUCH LANCETS MISC Use to check blood sugar twice a day.  DX E11.9  . pantoprazole (PROTONIX) 40 MG tablet Take 1 tablet (40 mg total) daily by mouth.  [name] PARoxetine (PAXIL) 30 MG tablet 1 tablet by mouth once daily  . tamsulosin (FLOMAX) 0.4 MG CAPS capsule Take 1 capsule (0.4 mg total) daily by mouth.   No facility-administered encounter medications on file as of 12/07/2016.     Functional Status:   In your present state of health, do you have any difficulty performing the following activities: 09/22/2016  Hearing? Y  Vision? Y  Difficulty concentrating or making decisions? Y  Walking or climbing stairs? N  Dressing or bathing? N  Doing errands, shopping? N  Preparing Food and eating ? N  Using the Toilet? N  In the past six months, have you accidently leaked urine? Y  Do you have problems with loss of bowel control? N  Managing your Medications? Y  Managing your Finances? N  Housekeeping or managing your Housekeeping? N  Some recent data might be hidden    Fall/Depression  Screening:    Fall Risk  11/18/2016 09/28/2016 09/22/2016  Falls in the past year? No No No  Comment - - -  Number falls in past yr: - - -  Injury with Fall? - - -  Risk Factor Category  - - -  Risk for fall due to : - - History of fall(s);Impaired vision  Risk for fall due to: Comment - - fell years ago wears reading glass   PHQ 2/9 Scores 09/22/2016 09/11/2016 11/13/2015 08/14/2015 05/02/2015 02/11/2015 04/26/2014  PHQ - 2 Score 0 1 0 0 0 2 0  Exception Documentation - - - - Patient refusal - -     Assessment:   Christian Morris called Cm x 2 today prior to scheduled Hearing/ENT appointment with Dr Benjamine Mola. Second call received at 1435 prior to CM 1500 arrival time to his home and 1530 appointment time  CM arrived to Christian Morris home at 1500 and drove in front of him to Dr Benjamine Mola, ENT, office for his appointment.  He informed Cm he was thankful because he would have gone to the incorrect facility.   Cm assisted Christian Morris with filing out new patient forms He continues to confirm he has not received forms from neurology office Cm again discussed reasons for ENT appointment today and Neurology appointments on 12/08/16.  Cm reviewed again the appointment time for the neurology appointment on 12/08/16.  Even after these discussions, Christian Morris questioned CM again about purposes of office visits x 4.  Pill box He reports he is doing "okay I got to refill it tonight"  Social Christian Morris confirms his sister in law remains the same, without any change. Last contact with his brother was on Friday December 04, 2016. She remains in [place]  Assessment answers 6 foot 260 lbs former smoker  DM states he has not checked his cbg "in a while but I felt okay"  Review of labs, metformin use, A1c, [facility]- reviewed 11/12/ elevated cholesterol and discussed a1c from 6.4 to 6.6  Discussed his access to [facility] He reports he has seen the [facility] vehicle come visit residents in his community but not used the services He reports he take metformin at night after dinner CM encouraged him to always take metformin with food  Christian Morris saw Dr Deeann Saint NP, Altha Harm. 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  Ears look good today, no wax or build up but he has age related issues with high frequency rage concerns - severe in both ears, hear sounds but drops consonants. There is no medicine  or surgery, only hearing aids available. no disease ear issues noted  "I hear as good as I want to. Some people you don't want to hear."  What did you say that was tomorrow?"   When CM asked Christian Morris if there was a way of getting him discounted hearing aids if he would get hearing aids? He states "I don't want hearing aids if I don't need them.  She would know wouldn't she?' Referring to Altha Harm , ENT NP. When Cm asked him the second time he stated "yes"   Dr Benjamine Mola also saw Christian Morris and confirms he has age related hearing loss (nerves).  Dr Benjamine Mola discussed if it is bothering, Christian Morris he could consider hearing aids.  His ear  canal is normal. Prior to Dr Benjamine Mola leaving the room Christian Morris was asked a question by [facility] CM and Christian Morris stated "Huh?" Dr Benjamine Mola stated "He actually heard you." Recommends checking annually and Christian Morris was scheduled for a follow up appointment on 12/13/17 at 59  The office staff reports a report will be sent to his pcp  Plan: Follow up with Christian Morris for his upcoming Neurology appointment this week Routed note to pcp and other care team providers  [facility] CM Care Plan Problem Three     Most Recent Value  Care Plan for Problem Three  Active  (Pended)   THN Long Term Goal   Over the next 90 days Patient will attend pcp and specialist appointments as scheduled  (Pended)   [place] Term Goal Start Date  11/18/16  (Pended)   Interventions for Problem Three Long Term Goal  coordinate appointments for neurology, ENT and ophthalmology, provide a calendar for appointments for patient on his refrigerator as a reminder, asked SW to assist with transportation as needed   (Pended)   THN CM Short Term Goal #1   over the next 30 days patient will remember and to go to scheduled appointments  (Pended)   THN CM Short Term Goal #1 Start Date  11/18/16  (Pended)   Interventions for Short Term Goal #1  assess needs to get to appointments, collaborate with [place] for possible need for transportation  to appointments (in basket msg)collaborate with pcp & specilist office staff  (Pended)        Kimberly L. [name], RN, BSN, [place] Care Management [phone]

## 2016-12-08 ENCOUNTER — Other Ambulatory Visit: Payer: Self-pay | Admitting: *Deleted

## 2016-12-08 DIAGNOSIS — M545 Low back pain: Secondary | ICD-10-CM | POA: Diagnosis not present

## 2016-12-08 DIAGNOSIS — F0390 Unspecified dementia without behavioral disturbance: Secondary | ICD-10-CM | POA: Diagnosis not present

## 2016-12-08 DIAGNOSIS — I10 Essential (primary) hypertension: Secondary | ICD-10-CM | POA: Diagnosis not present

## 2016-12-08 DIAGNOSIS — G4733 Obstructive sleep apnea (adult) (pediatric): Secondary | ICD-10-CM | POA: Diagnosis not present

## 2016-12-08 NOTE — Patient Outreach (Signed)
[facility] [facility]) Care Management  12/08/2016  Christian Morris 05/12/1940 [id number]   Care coordination  Cm met with Christian Morris and assisted him to get to his new neurological patient appointment for referral for memory loss.  He reports after arrival to [facility] Neurology office, "I would have gone to the wrong place"  Cm assisted him to complete his new patient information in the office.  He stated again he did not receive these forms in the mail. Cm had spoken with Thayer Ohm neurological office manager/referral coordinator on 12/07/16 and she had confirmed forms were sent to Christian Morris in the mail.  Social: Reports brother hung up on him on Sunday after Christian Morris attempted to call him.  He reports he will let his brother's daughter "check on him.  He is just like that."  Cm inquired if he checked to see if his brother had left him a voice message on his mobile phone.  Christian Morris states he did not know. Cm encouraged him to check his mobile messages but found out Christian Morris does not know how to check his mobile messages.  Cm assisted him to check his mobile message to confirm his brother and others had indeed left him messages but he did not know. This is a knowledge deficit. His brother called Christian Morris during the office visit and spoke in a loud frustrated tone with Christian Morris  Upcoming appointments:  Discussed with Christian Morris how he could best be helped to be informed and reminded of his appointments. Christian Morris and Cm discussed  Possible available options that may assist him with keeping up with his appointments. He states he does not hear his mobile ring, at times turns his mobile off and does not know how to check voice messages. Cm recommend he record a voice message on his mobile to inform any callers to continue to call him until he answers and not to leave a voice message as he is unable to check them Cm wrote down his upcoming appointments for pain management, pcp and eye doctor    Neuro evaluation notes Wt 268 lbs 165/76 51 "I didn't know I had memory issues" 3 words ball penny table said ball pen table, he was not able to spell world backwards  Ask son or daughter if these memory issues are new or not? Is a concern voiced by Christian Morris Shared results of ENT appointment on 12/07/16 and shared about the upcoming eye appointment in February 2019 with Christian Glow, PA/NP  She recommended further labs, MRI, medicine and to see Christian Raska in 6 weeks on 1/15 19 at 1200 (Tuesday) Christian Babel was given an appointment sheet This appointment was placed on his calendar  Plans Follow up with Christian Jolliffe in 4-6 weeks   Joelene Millin L. [name], RN, BSN, [place] Care Management [phone]

## 2016-12-10 ENCOUNTER — Ambulatory Visit: Payer: Self-pay | Admitting: *Deleted

## 2016-12-15 ENCOUNTER — Other Ambulatory Visit: Payer: Self-pay | Admitting: *Deleted

## 2016-12-15 NOTE — Patient Outreach (Signed)
[place] [facility]) Care Management  12/15/2016  [place] [birth year] [id number]   CSW called & spoke with patient regarding upcoming appointment with Dr. Posey Pronto at [place] - patient states that he was not aware of this appointment though CSW noted that [facility] had just reminded him of his appointments last week during Neurology appointment. Patient reports that his appointments last week for the hearing test and the neurologist "went well".  CSW spoke with patient about community resources regarding food banks. Patient reports that he has been able to get out to go grocery shopping without a problem and does not see a need for mobile meals at this time. CSW started to talk again with patient about going to the senior center in [place] for socialization but patient was receiving another phone call. CSW will follow-up and try again in 3 weeks.    Raynaldo Opitz, LCSW [facility]  Clinical Social Worker cell #: [phone]

## 2016-12-18 ENCOUNTER — Encounter: Payer: PPO | Attending: Physical Medicine & Rehabilitation | Admitting: Physical Medicine & Rehabilitation

## 2016-12-30 ENCOUNTER — Telehealth: Payer: Self-pay

## 2016-12-30 DIAGNOSIS — G8929 Other chronic pain: Secondary | ICD-10-CM

## 2016-12-30 DIAGNOSIS — M545 Low back pain: Principal | ICD-10-CM

## 2016-12-30 NOTE — Telephone Encounter (Signed)
Copied from [place] [phone]. Topic: Referral - Request >> Dec 30, 2016  3:17 PM Aurelio Brash B wrote: Reason for CRM: Pt is requesting a referral to the pain clinic,  he states he was offered this before but declined, however now he is ready

## 2016-12-30 NOTE — Telephone Encounter (Signed)
Please advise 

## 2016-12-31 ENCOUNTER — Telehealth: Payer: Self-pay

## 2016-12-31 ENCOUNTER — Emergency Department (HOSPITAL_COMMUNITY)
Admission: EM | Admit: 2016-12-31 | Discharge: 2016-12-31 | Disposition: A | Discharge status: Home / Self Care | Payer: PPO | Attending: Emergency Medicine | Admitting: Emergency Medicine

## 2016-12-31 ENCOUNTER — Encounter (HOSPITAL_COMMUNITY): Payer: Self-pay

## 2016-12-31 ENCOUNTER — Other Ambulatory Visit: Payer: Self-pay

## 2016-12-31 DIAGNOSIS — L299 Pruritus, unspecified: Secondary | ICD-10-CM | POA: Diagnosis not present

## 2016-12-31 DIAGNOSIS — E119 Type 2 diabetes mellitus without complications: Secondary | ICD-10-CM | POA: Diagnosis not present

## 2016-12-31 DIAGNOSIS — Z87891 Personal history of nicotine dependence: Secondary | ICD-10-CM | POA: Diagnosis not present

## 2016-12-31 DIAGNOSIS — Z7982 Long term (current) use of aspirin: Secondary | ICD-10-CM | POA: Insufficient documentation

## 2016-12-31 DIAGNOSIS — B356 Tinea cruris: Secondary | ICD-10-CM | POA: Insufficient documentation

## 2016-12-31 DIAGNOSIS — I10 Essential (primary) hypertension: Secondary | ICD-10-CM | POA: Diagnosis not present

## 2016-12-31 DIAGNOSIS — Z85828 Personal history of other malignant neoplasm of skin: Secondary | ICD-10-CM | POA: Insufficient documentation

## 2016-12-31 DIAGNOSIS — Z79899 Other long term (current) drug therapy: Secondary | ICD-10-CM | POA: Insufficient documentation

## 2016-12-31 MED ORDER — HYDROXYZINE HCL 25 MG PO TABS: 12.5000 mg | ORAL_TABLET | Freq: Three times a day (TID) | ORAL | 0 refills | Status: AC | PRN

## 2016-12-31 MED ORDER — CLOTRIMAZOLE 1 % EX CREA: TOPICAL_CREAM | CUTANEOUS | 0 refills | Status: DC

## 2016-12-31 MED ORDER — PERMETHRIN 5 % EX CREA: TOPICAL_CREAM | CUTANEOUS | 0 refills | Status: AC

## 2016-12-31 NOTE — Telephone Encounter (Signed)
Copied from [place] [phone]. Topic: Inquiry >> Dec 31, 2016  4:50 PM [place], Claiborne [place], [place] wrote: Reason for CRM: [facility] called because the patient had a referral sent over to there office but they do not take his insurance. If someone could give them a call back at [phone]

## 2016-12-31 NOTE — ED Triage Notes (Signed)
Patient reports of itching around penis . Denies sexual contact recently. Patient states "I have crabs, I have had them before".

## 2016-12-31 NOTE — ED Provider Notes (Signed)
[facility] EMERGENCY DEPARTMENT Provider Note   CSN: [id number] Arrival date & time: 12/31/16  2130     History   Chief Complaint Chief Complaint  Patient presents with  . Pubic Lice    HPI Christian Morris is a 76 y.o. male.  HPI  5-6 days of scrotal and inguinal fold itching.  Patient states this is similar to when he had scabies a while back.  No other associated symptoms.  No urinary symptoms.  No urinary discharge.  No rashes elsewhere.  No one else for the same symptoms.  Has not tried anything for the symptoms.  They have progressively worsened.  No other associated modifying symptoms.  Past Medical History:  Diagnosis Date  . Anxiety   . Arthritis    "legs" (02/21/2014)  . Chronic lower back pain   . Daily headache    "here lately" (02/21/2014)  . Depression   . Fatty liver disease, nonalcoholic   . GERD (gastroesophageal reflux disease)   . History of blood transfusion ~ 1958   "related to bleeding ulcers"  . History of gastric ulcer ~ 1958   no recurrence since.  . Hyperlipidemia   . Hypertension   . Hypogonadism male 01/11/2011  . Kidney stones   . Lung nodule    right 16 mm, seen initially 6/12. 72m in 08/2011.  . OSA on CPAP   . Squamous cell cancer of skin of forearm 01/23/2011   left  . Type II diabetes mellitus ([place]   . Urinary hesitancy   . Vitamin D deficiency 11/19/2014    Patient Active Problem List   Diagnosis Date Noted  . Memory loss 02/11/2015  . Vitamin D deficiency 11/19/2014  . Loss of weight 11/11/2014  . Hematuria 03/12/2014  . Frequent falls 11/14/2013  . Anemia 12/16/2012  . BPH (benign prostatic hyperplasia) 10/28/2012  . Nephrolithiasis 10/28/2012  . Environmental allergies 02/01/2012  . Squamous cell carcinoma in situ of skin of forearm 01/23/2011  . Hypogonadism male 01/11/2011  . Plantar fasciitis 05/19/2010  . ERECTILE DYSFUNCTION, ORGANIC 12/31/2009  . COPD (chronic obstructive pulmonary disease) ([place] 08/16/2009  .  GERD 08/16/2009  . Hepatic steatosis 08/16/2009  . LOW BACK PAIN, CHRONIC 08/16/2009  . MICROALBUMINURIA 08/16/2009  . Diabetes type 2, controlled ([place] 08/06/2009  . Hyperlipidemia 08/06/2009  . Obesity (BMI 30.0-34.9) 08/06/2009  . ANXIETY DEPRESSION 08/06/2009  . OSA (obstructive sleep apnea) 08/06/2009  . Essential hypertension 08/06/2009  . OSTEOARTHRITIS, KNEES, BILATERAL, MILD 08/06/2009  . PUD, HX OF 08/06/2009  . COLONIC POLYPS, HX OF 08/06/2009    Past Surgical History:  Procedure Laterality Date  . CARDIAC CATHETERIZATION    . CATARACT EXTRACTION W/ INTRAOCULAR LENS IMPLANT Left 08/2009   Dr GKaty Fitch . LEFT HEART CATHETERIZATION WITH CORONARY ANGIOGRAM N/A 11/17/2012   Procedure: LEFT HEART CATHETERIZATION WITH CORONARY ANGIOGRAM;  Surgeon: CBurnell Blanks MD;  [facility];  Service: Cardiovascular;  Laterality: N/A;  . MELANOMA EXCISION Left 01/23/2011   forearm       Home Medications    Prior to Admission medications   Medication Sig Start Date End Date Taking? Authorizing Provider  acetaminophen (TYLENOL) 500 MG tablet Take 500 mg by mouth every 6 (six) hours as needed.    [provider]  aspirin EC 81 MG tablet Take 81 mg by mouth every morning.     [provider]  atorvastatin (LIPITOR) 40 MG tablet Take 1 tablet (40 mg total) daily by mouth.  11/16/16   [name], NP  Blood Glucose Monitoring Suppl (ONETOUCH VERIO) w/Device KIT 1 kit by Does not apply route daily. Use to check blood sugar once a day.  Dx  E11.9 09/23/16   [name], NP  Blood Pressure Monitoring [facility]) MISC Use to check blood pressure daily if needed.  DX I10 09/23/16   [name], NP  clotrimazole (LOTRIMIN) 1 % cream Apply to affected area 2 times daily until symptoms totally resolve 12/31/16   Nadeem Romanoski, Corene Cornea, MD  fish oil-omega-3 fatty acids 1000 MG capsule Take 2 g by mouth 2 (two) times daily.      [provider]  glucose blood (ONETOUCH VERIO) test strip Use to check blood sugar two times a day.  DX  E11.9 09/25/16   [name], NP  hydrOXYzine (ATARAX/VISTARIL) 25 MG tablet Take 0.5 tablets (12.5 mg total) by mouth every 8 (eight) hours as needed for itching. 12/31/16   Vicente Weidler, Corene Cornea, MD  lisinopril (PRINIVIL,ZESTRIL) 5 MG tablet Take 1 tablet (5 mg total) by mouth daily. 08/14/16   [name], NP  metFORMIN (GLUCOPHAGE) 500 MG tablet TAKE TWO TABLETS BY MOUTH TWICE DAILY WITH FOOD 11/16/16   [name], NP  nitroGLYCERIN (NITROSTAT) 0.4 MG SL tablet Place 1 tablet (0.4 mg total) under the tongue every 5 (five) minutes x 3 doses as needed for chest pain (donot take with viagra). 11/04/16   [name], NP  ONE TOUCH LANCETS MISC Use to check blood sugar twice a day.  DX E11.9 09/25/16   [name], NP  pantoprazole (PROTONIX) 40 MG tablet Take 1 tablet (40 mg total) daily by mouth. 11/16/16   [name], NP  PARoxetine (PAXIL) 30 MG tablet 1 tablet by mouth once daily 11/16/16   [name], NP  permethrin (ELIMITE) 5 % cream Apply to affected area once on January 1st if symptoms not improving 12/31/16   Izael Bessinger, Corene Cornea, MD  tamsulosin (FLOMAX) 0.4 MG CAPS capsule Take 1 capsule (0.4 mg total) daily by mouth. 11/16/16   [name], NP    Family History Family History  Problem Relation Age of Onset  . Arthritis Mother   . Heart disease Sister        Massive MI age 66.  . Arrhythmia Brother   . Melanoma Son   . Heart attack Brother     Social History Social History   Tobacco Use  . Smoking status: Former Smoker    Packs/day: 1.00    Years: 25.00    Pack years: 25.00    Types: Cigarettes    Last attempt to quit: 01/05/1982    Years since quitting: 35.0  . Smokeless tobacco: Never Used  Substance Use Topics  . Alcohol use: Yes    Alcohol/week: 0.0 oz    Comment: "quit drinking in the 1980's"  . Drug use: No      Allergies   Patient has no known allergies.   Review of Systems Review of Systems  All other systems reviewed and are negative.    Physical Exam Updated Vital Signs BP (!) 169/81 (BP Location: Right Arm)   Pulse 60   Temp 97.8 F (36.6 C) (Oral)   Resp 17   Ht 6' (1.829 m)   Wt 117.9 kg (260 lb)   SpO2 97%   BMI 35.26 kg/m   Physical Exam  Constitutional: He appears well-developed and well-nourished.  HENT:  Head: Normocephalic and atraumatic.  Eyes: Conjunctivae and EOM are normal.  Neck: Normal range of motion.  Cardiovascular: Normal rate.  Pulmonary/Chest: Effort normal. No respiratory distress.  Abdominal: He exhibits no distension.  Genitourinary:  Genitourinary Comments: Bilateral inguinal folds with wet area and excoriations with a raised erythematous border.  Scrotum mild edema with multiple excoriated areas.   Musculoskeletal: Normal range of motion.  Neurological: He is alert.  Skin: Skin is warm and dry. Rash noted.  Nursing note and vitals reviewed.    ED Treatments / Results  Labs (all labs ordered are listed, but only abnormal results are displayed) Labs Reviewed - No data to display  EKG  EKG Interpretation None       Radiology No results found.  Procedures Procedures (including critical care time)  Medications Ordered in ED Medications - No data to display   Initial Impression / Assessment and Plan / ED Course  I have reviewed the triage vital signs and the nursing notes.  Pertinent labs & imaging results that were available during my care of the patient were reviewed by me and considered in my medical decision making (see chart for details).     Suspect he actually has tinea cruris however the patient is adamant this is exactly what it was likely has scabies.  On visual inspection I do not see a single insect in his scrotal area and most of his areas of complaint are consistent with a fungal infection and excoriation  however we will start him on Lotrimin and if this does not seem to be improved in the next 4-5 days he will go ahead and use permethrin.  Atarax given for itching as needed and precautions given for driving or ambulation secondary to balance and sleepiness issues.  Final Clinical Impressions(s) / ED Diagnoses   Final diagnoses:  Itching  Tinea cruris    ED Discharge Orders        Ordered    clotrimazole (LOTRIMIN) 1 % cream     12/31/16 0806    permethrin (ELIMITE) 5 % cream     12/31/16 0806    hydrOXYzine (ATARAX/VISTARIL) 25 MG tablet  Every 8 hours PRN     12/31/16 0807       Dandra Velardi, Corene Cornea, MD 12/31/16 [phone]

## 2017-01-07 ENCOUNTER — Other Ambulatory Visit: Payer: Self-pay | Admitting: *Deleted
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
## 2017-01-08 ENCOUNTER — Other Ambulatory Visit (HOSPITAL_COMMUNITY): Payer: Self-pay | Admitting: Neurology

## 2017-01-08 DIAGNOSIS — R413 Other amnesia: Secondary | ICD-10-CM

## 2017-01-15 DIAGNOSIS — G4733 Obstructive sleep apnea (adult) (pediatric): Secondary | ICD-10-CM | POA: Diagnosis not present

## 2017-01-22 ENCOUNTER — Ambulatory Visit (INDEPENDENT_AMBULATORY_CARE_PROVIDER_SITE_OTHER): Payer: PPO | Admitting: Family

## 2017-01-22 ENCOUNTER — Encounter: Payer: Self-pay | Admitting: Family

## 2017-01-22 VITALS — BP 137/58 | HR 54 | Temp 98.4°F | Resp 16 | Ht 72.0 in | Wt 263.4 lb

## 2017-01-22 DIAGNOSIS — R1013 Epigastric pain: Secondary | ICD-10-CM | POA: Diagnosis not present

## 2017-01-22 NOTE — Progress Notes (Signed)
Subjective:    Patient ID: Christian Morris, male    DOB: 05-Sep-1940, 77 y.o.   MRN: [id number]  HPI  Patient is a 77 yr old male who presents today with chief complaint of abdominal pain. Reports that this started "a couple of weeks ago." Reports pain is 1-2 times a week. Last a few hours or "all day."  Denies nausea or vomiting. Reports occasional constipation. Pain is epigastric.     Review of Systems See HPI  Past Medical History:  Diagnosis Date  . Anxiety   . Arthritis    "legs" (02/21/2014)  . Chronic lower back pain   . Daily headache    "here lately" (02/21/2014)  . Depression   . Fatty liver disease, nonalcoholic   . GERD (gastroesophageal reflux disease)   . History of blood transfusion ~ 1958   "related to bleeding ulcers"  . History of gastric ulcer ~ 1958   no recurrence since.  . Hyperlipidemia   . Hypertension   . Hypogonadism male 01/11/2011  . Kidney stones   . Lung nodule    right 16 mm, seen initially 6/12. 73m in 08/2011.  . OSA on CPAP   . Squamous cell cancer of skin of forearm 01/23/2011   left  . Type II diabetes mellitus ([place]   . Urinary hesitancy   . Vitamin D deficiency 11/19/2014     Social History   Socioeconomic History  . Marital status: Divorced    Spouse name: Not on file  . Number of children: 3  . Years of education: Not on file  . Highest education level: Not on file  Social Needs  . Financial resource strain: Not on file  . Food insecurity - worry: Not on file  . Food insecurity - inability: Not on file  . Transportation needs - medical: Not on file  . Transportation needs - non-medical: Not on file  Occupational History  . Occupation: RETIRED BUS DRIVER    Employer: RETIRED  Tobacco Use  . Smoking status: Former Smoker    Packs/day: 1.00    Years: 25.00    Pack years: 25.00    Types: Cigarettes    Last attempt to quit: 01/05/1982    Years since quitting: 35.0  . Smokeless tobacco: Never Used  Substance and Sexual  Activity  . Alcohol use: Yes    Alcohol/week: 0.0 oz    Comment: "quit drinking in the 1980's"  . Drug use: No  . Sexual activity: Yes  Other Topics Concern  . Not on file  Social History Narrative   Regular exercise:  Yes   [place]    Past Surgical History:  Procedure Laterality Date  . CARDIAC CATHETERIZATION    . CATARACT EXTRACTION W/ INTRAOCULAR LENS IMPLANT Left 08/2009   Dr GKaty Fitch . LEFT HEART CATHETERIZATION WITH CORONARY ANGIOGRAM N/A 11/17/2012   Procedure: LEFT HEART CATHETERIZATION WITH CORONARY ANGIOGRAM;  Surgeon: CBurnell Blanks MD;  [facility];  Service: Cardiovascular;  Laterality: N/A;  . MELANOMA EXCISION Left 01/23/2011   forearm    Family History  Problem Relation Age of Onset  . Arthritis Mother   . Heart disease Sister        Massive MI age 79  . Arrhythmia Brother   . Melanoma Son   . Heart attack Brother     No Known Allergies  Current Outpatient Medications on File Prior to Visit  Medication Sig Dispense Refill  . acetaminophen (TYLENOL) 500 MG tablet Take 500 mg by mouth every 6 (six) hours as needed.    [name] aspirin EC 81 MG tablet Take 81 mg by mouth every morning.     [name] atorvastatin (LIPITOR) 40 MG tablet Take 1 tablet (40 mg total) daily by mouth. 30 tablet 5  . Blood Glucose Monitoring Suppl (ONETOUCH VERIO) w/Device KIT 1 kit by Does not apply route daily. Use to check blood sugar once a day.  Dx  E11.9 1 kit 0  . Blood Pressure Monitoring (SPHYGMOMANOMETER) MISC Use to check blood pressure daily if needed.  DX I10 1 each 0  . clotrimazole (LOTRIMIN) 1 % cream Apply to affected area 2 times daily until symptoms totally resolve 60 g 0  . donepezil (ARICEPT) 5 MG tablet     . fish oil-omega-3 fatty acids 1000 MG capsule Take 2 g by mouth 2 (two) times daily.      [name] glucose blood (ONETOUCH VERIO) test strip Use to check blood sugar two times a day.  DX  E11.9 100 each 5  .  hydrOXYzine (ATARAX/VISTARIL) 25 MG tablet Take 0.5 tablets (12.5 mg total) by mouth every 8 (eight) hours as needed for itching. 12 tablet 0  . lisinopril (PRINIVIL,ZESTRIL) 5 MG tablet Take 1 tablet (5 mg total) by mouth daily. 30 tablet 5  . metFORMIN (GLUCOPHAGE) 500 MG tablet TAKE TWO TABLETS BY MOUTH TWICE DAILY WITH FOOD 120 tablet 5  . nitroGLYCERIN (NITROSTAT) 0.4 MG SL tablet Place 1 tablet (0.4 mg total) under the tongue every 5 (five) minutes x 3 doses as needed for chest pain (donot take with viagra). 60 tablet 3  . ONE TOUCH LANCETS MISC Use to check blood sugar twice a day.  DX E11.9 100 each 5  . pantoprazole (PROTONIX) 40 MG tablet Take 1 tablet (40 mg total) daily by mouth. 30 tablet 5  . PARoxetine (PAXIL) 30 MG tablet 1 tablet by mouth once daily 30 tablet 5  . permethrin (ELIMITE) 5 % cream Apply to affected area once on January 1st if symptoms not improving 60 g 0  . tamsulosin (FLOMAX) 0.4 MG CAPS capsule Take 1 capsule (0.4 mg total) daily by mouth. 30 capsule 5   No current facility-administered medications on file prior to visit.     BP (!) 137/58 (BP Location: Right Arm, Patient Position: Sitting, Cuff Size: Large)   Pulse (!) 54   Temp 98.4 F (36.9 C) (Oral)   Resp 16   Ht 6' (1.829 m)   Wt 263 lb 6.4 oz (119.5 kg)   SpO2 98%   BMI 35.72 kg/m       Objective:   Physical Exam  Constitutional: He is oriented to person, place, and time. He appears well-developed and well-nourished. No distress.  HENT:  Head: Normocephalic and atraumatic.  Cardiovascular: Normal rate and regular rhythm.  No murmur heard. Pulmonary/Chest: Effort normal and breath sounds normal. No respiratory distress. He has no wheezes. He has no rales.  Abdominal: Soft. Bowel sounds are normal. There is tenderness in the epigastric area. There is no rigidity and no guarding.  Musculoskeletal: He exhibits no edema.  Neurological: He is alert and oriented to person, place, and time.  Skin:  Skin is warm and dry.  Psychiatric: He has a normal mood and affect. His behavior is normal. Thought content normal.          Assessment & Plan:  Epigastric pain-  will obtain cmet, cbc, lipase and refer for abdominal US.  He is instructed to go to the ER if he develops severe/recurrent abdominal pain.   Pt verbalizes understanding.

## 2017-01-22 NOTE — Patient Instructions (Signed)
Please complete lab work prior to leaving.  Return on 11/22 at 10:30 AM for abdominal ultrasound- nothing to eat/drink for 4 hours before your ultrasound.   Please contact Dr. Freddie Apley office to reschedule your appointment. [phone] Go to the ER if you develop severe/worsening abdominal pain or if you cannot keep down food/medicine.

## 2017-01-26 ENCOUNTER — Ambulatory Visit (HOSPITAL_BASED_OUTPATIENT_CLINIC_OR_DEPARTMENT_OTHER)
Admission: RE | Admit: 2017-01-26 | Discharge: 2017-01-26 | Disposition: A | Discharge status: Home / Self Care | Payer: PPO | Source: Ambulatory Visit | Attending: Family | Admitting: Family

## 2017-01-26 ENCOUNTER — Telehealth: Payer: Self-pay | Admitting: *Deleted

## 2017-01-26 ENCOUNTER — Ambulatory Visit (HOSPITAL_BASED_OUTPATIENT_CLINIC_OR_DEPARTMENT_OTHER): Payer: PPO

## 2017-01-26 DIAGNOSIS — N1330 Unspecified hydronephrosis: Secondary | ICD-10-CM

## 2017-01-26 DIAGNOSIS — N132 Hydronephrosis with renal and ureteral calculous obstruction: Secondary | ICD-10-CM | POA: Diagnosis not present

## 2017-01-26 DIAGNOSIS — R1013 Epigastric pain: Secondary | ICD-10-CM | POA: Diagnosis present

## 2017-01-26 DIAGNOSIS — I700 Atherosclerosis of aorta: Secondary | ICD-10-CM | POA: Diagnosis not present

## 2017-01-26 DIAGNOSIS — R140 Abdominal distension (gaseous): Secondary | ICD-10-CM | POA: Insufficient documentation

## 2017-01-26 NOTE — Telephone Encounter (Signed)
-----   Message from [name], NP sent at 01/26/2017  2:19 PM EST ----- Also, please have him return to the lab for a basic metabolic panel. Diagnosis hydronephrosis.

## 2017-01-27 ENCOUNTER — Telehealth: Payer: Self-pay | Admitting: *Deleted

## 2017-01-27 ENCOUNTER — Telehealth: Payer: Self-pay | Admitting: Family

## 2017-01-27 NOTE — Patient Outreach (Signed)
[facility]) Care Management  01/27/2017  Christian Morris 08/18/1940 [id number]   CARE COORDINATION  CM received an in basket message from Christian Morris from Christian Kinn' Primary care provider office to request assisting Christian Morris to get to a scheduled urology appointment in [place] that could not be scheduled in [place] for 01/28/17 at 78 CM called Christian Morris who confirms a male friend was called to assist him with getting to the correct office on 01/28/17. Christian Morris was able to state the address of the urology office  as [place] and informed CM he is scheduled to be seen at 1230 Cm reminded Christian Morris of his upcoming ophthalmology appointment on 2/13 2019 at [facility] office. Cm reminded him he should receive a reminder call from the office and that this appointment was written on his calendar CM placed on his refrigerator and in the [facility] calendar. He stated "I don't know why I am going to all these appointments/" Referred him to Christian Morris. Cm inquired if he went to his follow up neurology appointment that was on 01/19/17  Christian Morris informed CM he did not and that he did not know about the appointment.  CM reminded him that he was informed of the appointment at the end of the first neurology appointment, was given a sheet with the appointment on it and reminded by this CM and the neurology office. Cm discussed evaluation for case closure. CM updated Christian Morris via in basket in Epic that Christian Morris had assistance from a male friend to get to his appointment and asked if personal care services could be considered for Christian Morris via [place] [facility] or [facility]. Left a message for Christian Morris to return a call to CM.  Christian Morris called Cm back after the CM outreach call to him earlier.  He asked if CM was going with him to the 01/28/17 appointment.  CM informed him CM would not be attending.  He asked "Why?" and Cm informed him CM believed he had a plan and was capable of  attending without this CM.  Christian Morris disconnected the call after stating "Fine" in a loud tone of voice. Christian Morris' phone later dialed Cm again x 2 without voice messages left  Plans for case closure  Collaborated with Christian Morris SW about possible personal care services for Christian Morris and case closure via in basket in [place], SW to speak with Christian Morris  Spoke with Christian Morris at Christian Morris (neurology) office and confirmed Christian Morris missed the 01/19/17 1200 appointment Christian Morris confirmed a reminder call was left for Christian Morris on his phone- she inquired about other telephone numbers but CM confirmed there is only one number in Epic for his home and mobile numbers.  Christian Morris offered to change it so that Christian Morris reminder messages would be sent to his phone as text messages Spoke with Primary care provider office to leave a message for Christian Morris to return a call to CM related to questionable case closure  Spoke with [name] about completion of 2018 appointments with resistance from Christian Morris, him questioning the purpose of his appointments, a phone call interaction with his brother observed, his plan for getting to his upcoming appointment, the missed 01/19/17 appointment even with reminders, personal care companion services (discussed with [facility] SW also) and case closure Christian Morris agrees to case closure and will speak with Christian Morris further about personal care companion services (no medicaid).  She has also spoken with his family about possible support without success ("burned bridges") [facility] CMA updated Case closure letters sent to Patient and Primary care provider    Christian Morris L. [name], RN, BSN, [place] Care Management [phone]

## 2017-01-27 NOTE — Telephone Encounter (Signed)
-----   Message from Barbaraann Faster, RN sent at 01/27/2017  2:48 PM EST ----- Regarding: RE: appointment 01/28/17 Called him just now  He has the address and is getting a male friend to help him find the urology office tomorrow Can Lenna Sciara assist him with referral to personal care services through [facility] or [facility]?  Kimberly L. [name], RN, BSN, [place] Management [phone]  ----- Message ----- From: Ronny Flurry, CMA Sent: 01/27/2017   2:33 PM To: Barbaraann Faster, RN Subject: RE: appointment 01/28/17                        Madaline Brilliant, thank you! ----- Message ----- From: Barbaraann Faster, RN Sent: 01/27/2017   2:23 PM To: Ronny Flurry, CMA Subject: RE: appointment 01/28/17                        Yes I can call him and remind him to go and make sure he has directions  I do not transport patients  Kimberly L. [name], RN, BSN, CCM [facility] Care Management [phone]   ----- Message ----- From: Ronny Flurry, CMA Sent: 01/27/2017   9:36 AM To: Barbaraann Faster, RN, Ronny Flurry, CMA Subject: appointment 01/28/17                            We have scheduled Mr Saari to see a urologist at [facility] [place], 2nd fl on 01/28/17 at 12:30pm.  They had no availability at the [place] office and pt needs to be seen this week. He also needs to come to our lab for repeat bloodwork after his appt with Urology. I told him to just come to our office whenever he finishes with Urology. Can you help ensure that he doesn't miss the urology appt?

## 2017-01-27 NOTE — Telephone Encounter (Signed)
Reviewed case with Joelene Millin. She states he attended neuro appointment with Dr. Merlene Laughter. Can we please request OV notes from his office?

## 2017-01-27 NOTE — Telephone Encounter (Signed)
Spoke with nurse at [facility] as pt has seen Dr Janice Norrie and Dr Louis Meckel in the past. They have scheduled appt for tomorrow, 01/28/17 at 12:30pm at [place] as there is no availability at the [place] location. Referral, u/s and office notes faxed to [phone].  Pt has been notified and and message has been sent Nurse [name] w/[facility].

## 2017-01-27 NOTE — Addendum Note (Signed)
Addended by: Kelle Darting A on: 01/27/2017 10:08 AM   Modules accepted: Orders

## 2017-01-27 NOTE — Telephone Encounter (Signed)
Notes recorded by [name], NP on 01/26/2017 at 2:18 PM EST Could you please place a referral to urology and try to get the patient in this week. Diagnosis is hydronephrosis.

## 2017-01-27 NOTE — Telephone Encounter (Signed)
Copied from [place]. Topic: Quick Communication - See Telephone Encounter >> Jan 27, 2017  3:09 PM Ether Griffins B wrote: CRM for notification. See Telephone encounter for:  [place] calling from [facility] needing [name] to call her back for questionable case closer. Contact number [phone].   01/27/17.

## 2017-01-27 NOTE — Telephone Encounter (Signed)
I believe that personal care services are only available for medicaid patients and he does not have medicaid to my knowledge.  Does healthteam advantage cover this?

## 2017-01-27 NOTE — Telephone Encounter (Signed)
Lab appt scheduled for tomorrow at 3 or whenever pt finishes with Urology appt. Future order placed.

## 2017-01-28 ENCOUNTER — Other Ambulatory Visit: Payer: PPO

## 2017-01-28 NOTE — Telephone Encounter (Signed)
Sent fax to Dr. Freddie Apley office requesting  OV notes from patients recent visit.

## 2017-02-02 NOTE — Telephone Encounter (Signed)
I have called Dr. Freddie Apley office, and left a message with Miles Costain requesting office visit notes from patients recent visit at their office.

## 2017-02-08 ENCOUNTER — Telehealth: Payer: Self-pay | Admitting: Family

## 2017-02-08 NOTE — Telephone Encounter (Signed)
Pt was scheduled with Dr Posey Pronto on 12/18/16 and he no showed the appt. Spoke with scheduler at [facility] and rescheduled appt with Dr Posey Pronto for this Friday (02/12/17) at 10:40; pt will need to arrive at 10:20am.  Called pt and provided pt with appt date, time, address and phone #. Information also sent to [facility] to help remind pt of appt.

## 2017-02-08 NOTE — Telephone Encounter (Signed)
I am unable to provide him with any narcotic prescriptions.  It looks like we placed a referral to pain management back in December. Can we please follow up on this referral status?  If he wants to schedule an office visit to discuss other non-narcotic treatments for back pain he is welcome to do so.

## 2017-02-08 NOTE — Telephone Encounter (Signed)
Copied from [place]. Topic: Quick Communication - Rx Refill/Question >> Feb 08, 2017  9:15 AM Margot Ables wrote: Medication: Pain Medication - pt didn't have name - he states he is trying to do without the pain medicine but he just can't stand the pain in his back - pt is out  Has the patient contacted their pharmacy? No.  Preferred Pharmacy (with phone number or street name): [place], [place] - [id number] [phone] (Phone) [phone] (Fax)  Agent: Please be advised that RX refills may take up to 3 business days. We ask that you follow-up with your pharmacy.

## 2017-02-09 ENCOUNTER — Ambulatory Visit: Payer: Self-pay | Admitting: *Deleted

## 2017-02-11 ENCOUNTER — Telehealth: Payer: Self-pay | Admitting: *Deleted

## 2017-02-11 NOTE — Patient Outreach (Signed)
Christian Morris) Care Management  02/11/2017  Christian Morris January 13, 1940 [id number]   Care coordination  [facility] CM received an Epic call message from Gilmore Laroche about [name] needing to be reminded of a pain management appointment for 02/12/17 with Dr Posey Pronto Case closure occurred for [name] for [place] on 01/27/17 Letters were sent to pt and [name] after discussing issues with [place] sent Epic message back to Gilmore Laroche about case closure Called [name] to remind him of 02/12/17 pain management appointment with Dr Posey Pronto providing the spelling of the name, address and contact number. He voiced being aware of this appointment  CM also reminded him of his eye MD appointment on 02/17/17 at 1330 at [facility] drive Provided the number so he could call to confirm time and place.   [name] took time to write all of this down but also noted to read back the information to CM.and to read already written information from [facility] calendar CM left at his home It contained all information about his appointments. Plus CM had reminded [name] of all remaining appointments on the last contact call to him.    Kimberly L. [name], RN, BSN, [place] Coordinator [phone] week day mobile

## 2017-02-12 ENCOUNTER — Encounter: Payer: PPO | Attending: Physical Medicine & Rehabilitation | Admitting: Physical Medicine & Rehabilitation

## 2017-02-12 ENCOUNTER — Encounter: Payer: Self-pay | Admitting: Physical Medicine & Rehabilitation

## 2017-02-12 VITALS — BP 143/93 | HR 62

## 2017-02-12 DIAGNOSIS — E559 Vitamin D deficiency, unspecified: Secondary | ICD-10-CM | POA: Diagnosis not present

## 2017-02-12 DIAGNOSIS — M791 Myalgia, unspecified site: Secondary | ICD-10-CM

## 2017-02-12 DIAGNOSIS — K219 Gastro-esophageal reflux disease without esophagitis: Secondary | ICD-10-CM

## 2017-02-12 DIAGNOSIS — R269 Unspecified abnormalities of gait and mobility: Secondary | ICD-10-CM

## 2017-02-12 DIAGNOSIS — M545 Low back pain, unspecified: Secondary | ICD-10-CM

## 2017-02-12 DIAGNOSIS — F341 Dysthymic disorder: Secondary | ICD-10-CM

## 2017-02-12 DIAGNOSIS — R296 Repeated falls: Secondary | ICD-10-CM | POA: Diagnosis not present

## 2017-02-12 DIAGNOSIS — E669 Obesity, unspecified: Secondary | ICD-10-CM | POA: Insufficient documentation

## 2017-02-12 DIAGNOSIS — M1710 Unilateral primary osteoarthritis, unspecified knee: Secondary | ICD-10-CM | POA: Diagnosis not present

## 2017-02-12 DIAGNOSIS — G4733 Obstructive sleep apnea (adult) (pediatric): Secondary | ICD-10-CM

## 2017-02-12 DIAGNOSIS — R413 Other amnesia: Secondary | ICD-10-CM | POA: Diagnosis not present

## 2017-02-12 DIAGNOSIS — N200 Calculus of kidney: Secondary | ICD-10-CM

## 2017-02-12 DIAGNOSIS — F0390 Unspecified dementia without behavioral disturbance: Secondary | ICD-10-CM

## 2017-02-12 DIAGNOSIS — G8929 Other chronic pain: Secondary | ICD-10-CM | POA: Diagnosis not present

## 2017-02-12 DIAGNOSIS — IMO0002 Reserved for concepts with insufficient information to code with codable children: Secondary | ICD-10-CM

## 2017-02-12 MED ORDER — METHOCARBAMOL 750 MG PO TABS: 750.0000 mg | ORAL_TABLET | Freq: Two times a day (BID) | ORAL | 1 refills | Status: AC | PRN

## 2017-02-12 MED ORDER — DICLOFENAC SODIUM 1 % TD GEL: 2.0000 g | Freq: Four times a day (QID) | TRANSDERMAL | 1 refills | Status: AC

## 2017-02-12 NOTE — Progress Notes (Signed)
Subjective:    Patient ID: Christian Morris, male    DOB: 02-19-1940, 77 y.o.   MRN: [id number]  HPI 77 y/o male with pmh of Vit D def, DM, OSA, kidney stones, HTN, GERD, depression, anxiety, OA of knees, melanoma, dementia presents with bilateral low back pain. Poor historian. Started ~01/2016. He states he had a fall ~60 years ago.  Progressively getting worse. Denies alleviating factors. Any activity exacerbates the pain.  Constant.  Non-radiating.  Hydrocodone ineffective.  Denies associated numbness/weakness.  Pain limits enjoyable activities.  2-3 falls in last year when he feels he could not catch himself.   Pain Inventory Average Pain 9 Pain Right Now 9 My pain is .  In the last 24 hours, has pain interfered with the following? General activity 0 Relation with others 0 Enjoyment of life 0 What TIME of day is your pain at its worst? all Sleep (in general) Fair  Pain is worse with: walking, bending and standing Pain improves with: . Relief from Meds: .  Mobility walk without assistance ability to climb steps?  yes do you drive?  yes  Function retired  Neuro/Psych No problems in this area  Prior Studies Any changes since last visit?  no  Physicians involved in your care Any changes since last visit?  no   Family History  Problem Relation Age of Onset  . Arthritis Mother   . Heart disease Sister        Massive MI age 2.  . Arrhythmia Brother   . Melanoma Son   . Heart attack Brother    Social History   Socioeconomic History  . Marital status: Divorced    Spouse name: None  . Number of children: 3  . Years of education: None  . Highest education level: None  Social Needs  . Financial resource strain: None  . Food insecurity - worry: None  . Food insecurity - inability: None  . Transportation needs - medical: None  . Transportation needs - non-medical: None  Occupational History  . Occupation: RETIRED BUS DRIVER    Employer: RETIRED  Tobacco Use    . Smoking status: Former Smoker    Packs/day: 1.00    Years: 25.00    Pack years: 25.00    Types: Cigarettes    Last attempt to quit: 01/05/1982    Years since quitting: 35.1  . Smokeless tobacco: Never Used  Substance and Sexual Activity  . Alcohol use: No    Frequency: Never  . Drug use: No  . Sexual activity: Yes  Other Topics Concern  . 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   Past Surgical History:  Procedure Laterality Date  . CARDIAC CATHETERIZATION    . CATARACT EXTRACTION W/ INTRAOCULAR LENS IMPLANT Left 08/2009   Dr Katy Fitch  . LEFT HEART CATHETERIZATION WITH CORONARY ANGIOGRAM N/A 11/17/2012   Procedure: LEFT HEART CATHETERIZATION WITH CORONARY ANGIOGRAM;  Surgeon: Burnell Blanks, MD;  Location: [facility] C[facility] LAB;  Service: Cardiovascular;  Laterality: N/A;  . MELANOMA EXCISION Left 01/23/2011   forearm   Past Medical History:  Diagnosis Date  . Anxiety   . Arthritis    "legs" (02/21/2014)  . Chronic lower back pain   . Daily headache    "here lately" (02/21/2014)  . Depression   . Fatty liver disease, nonalcoholic   . GERD (gastroesophageal reflux disease)   . History  of blood transfusion ~ 1958   "related to bleeding ulcers"  . History of gastric ulcer ~ 1958   no recurrence since.  . Hyperlipidemia   . Hypertension   . Hypogonadism male 01/11/2011  . Kidney stones   . Lung nodule    right 16 mm, seen initially 6/12. 43mm in 08/2011.  . OSA on CPAP   . Squamous cell cancer of skin of forearm 01/23/2011   left  . Type II diabetes mellitus ([place])   . Urinary hesitancy   . Vitamin D deficiency 11/19/2014   BP (!) 143/93   Pulse 62   SpO2 93%   Opioid Risk Score:   Fall Risk Score:  `1  Depression screen PHQ 2/9  Depression screen [facility] 2/9 09/22/2016 09/11/2016 11/13/2015 08/14/2015 05/02/2015 02/11/2015 04/26/2014  Decreased Interest 0 0 0 0 0 1 0  Down, Depressed, Hopeless 0 1 0 0 0 1  0  PHQ - 2 Score 0 1 0 0 0 2 0  Some recent data might be hidden     Review of Systems  Constitutional: Negative.   HENT: Negative.   Eyes: Negative.   Respiratory: Negative.   Cardiovascular: Negative.   Gastrointestinal: Negative.   Endocrine: Negative.   Genitourinary: Negative.   Musculoskeletal: Positive for arthralgias, back pain, gait problem and myalgias.  Skin: Negative.   Allergic/Immunologic: Negative.   Hematological: Negative.   Psychiatric/Behavioral: Negative.   All other systems reviewed and are negative.     Objective:   Physical Exam Gen: NAD. Vital signs reviewed HENT: Normocephalic, Atraumatic Eyes: EOMI. No discharge.  Cardio: RRR. No JVD. Pulm: B/l clear to auscultation.  Effort normal Abd: Soft, BS+ MSK:  Gait slow and antalgic.   TTP diffusely across lower back.    No edema.  Neuro: CN II-XII grossly intact.   HOH   Sensation intact to light touch in all LE dermatomes  Reflexes 2+ throughout  Strength  4/5 in all LE myotomes (pain inhibition RLE>LLE)  SLR neg b/l Skin: Warm and Dry. Intact Psych: Memory loss    Assessment & Plan:  77 y/o male with pmh of Vit D def, DM, OSA, kidney stones, HTN, GERD, depression, anxiety, OA of knees, melanoma, ?dementia presents with bilateral low back pain.  1. Chronic axial low back pain  Xray from 2016 reviewed, showing mild anterolisthesis, will reorder  Labs reviewed  Referral information reviewed  PMAWARE reviewed  Will avoid NSAIDs to GERD/renal stone and TENS due to hx of melanoma  Encouraged trial of Heat, encourage trial of cold on next visit  Will Voltaren gel  Will order Robaxin 750 BID PRN  Will order Vit D, B12, Mag labs  Will consider Lidoderm patch  Will consider PT after imaging  Will consider bracing  Will consider Gabapentin  Will consider Cymbalta   2. Gait abnormality  Will order quad cane  Will consider PT referral  Pt also described fenestrating like gait (although not  observed), will consider Neurology referral if warranted   4. Morbid Obesity  Will make referral to dietitian  5. Myalgia   Will consider trigger point injections  6. Knee OA b/l  Voltaren gel ordered  7. OSA  Cont CPAP  8. Anxiety/depression  Cont meds  9. Dementia  Cont meds  Pt lives alone, will need to be mindful in prescribing medications and providing instructions. Will attempt to maintain independence

## 2017-02-15 ENCOUNTER — Other Ambulatory Visit: Payer: Self-pay | Admitting: *Deleted

## 2017-02-15 NOTE — Patient Outreach (Signed)
[facility]) Care Management  02/15/2017  [place] [birth year] [id number]   CSW called & spoke with patient to follow-up on community resources. Patient states that he still has the information provided to him by CSW on food pantries but has not had a need to go to them yet. Patient reports that he has been going to his appointments and just had an appointment with his back doctor the other day and is waiting for the pain medications to help. Patient states that he is aware of his upcoming appointments in March with his PCP and Dr. Posey Pronto. Patient states that he does not wish go to go the [facility] either at this time but is familiar with where it is incase he does decide he wants to visit.   CSW will perform a case closure on patient, as all goals of treatment have been met from social work standpoint and no additional social work needs have been identified at this time.   CSW will fax an update to patient's Primary Care Physician, [name], NP to ensure that they are aware of CSW's involvement with patient's plan of care. CSW will submit a case closure request to Verlon Setting, Care Management Assistant with [place] in the form of an inbasket message.    Raynaldo Opitz, LCSW [facility]  Clinical Social Worker cell #: [phone]

## 2017-02-17 ENCOUNTER — Telehealth: Payer: Self-pay | Admitting: Family

## 2017-02-17 DIAGNOSIS — Z0100 Encounter for examination of eyes and vision without abnormal findings: Principal | ICD-10-CM

## 2017-02-17 DIAGNOSIS — E119 Type 2 diabetes mellitus without complications: Secondary | ICD-10-CM

## 2017-02-17 NOTE — Telephone Encounter (Signed)
Copied from [place] [id number]. Topic: Referral - Request >> Feb 17, 2017  8:58 AM Boyd Kerbs wrote:  Reason for CRM:   Pt. Is asking for a referral for an eye dr.

## 2017-02-17 NOTE — Telephone Encounter (Signed)
Referral placed.

## 2017-02-23 ENCOUNTER — Telehealth: Payer: Self-pay | Admitting: *Deleted

## 2017-02-23 NOTE — Telephone Encounter (Signed)
Gwen or [employer]-- did you fax this referral on this pt? If so, can you refax to [facility]?  Copied from [place]. Topic: General - Other >> Feb 23, 2017  3:01 PM Christian Morris, NT wrote: [facility] is calling and states she received a fax in regards to this patient at fax number [phone]. She states the fax is supposed to go to [facility]. Please refax this to the correct place. Thank you

## 2017-03-02 DIAGNOSIS — Z6838 Body mass index (BMI) 38.0-38.9, adult: Secondary | ICD-10-CM | POA: Diagnosis not present

## 2017-03-02 DIAGNOSIS — R05 Cough: Secondary | ICD-10-CM | POA: Diagnosis not present

## 2017-03-02 DIAGNOSIS — J019 Acute sinusitis, unspecified: Secondary | ICD-10-CM | POA: Diagnosis not present

## 2017-03-12 ENCOUNTER — Encounter: Payer: PPO | Attending: Physical Medicine & Rehabilitation | Admitting: Physical Medicine & Rehabilitation

## 2017-03-12 DIAGNOSIS — G8929 Other chronic pain: Secondary | ICD-10-CM | POA: Insufficient documentation

## 2017-03-12 DIAGNOSIS — E559 Vitamin D deficiency, unspecified: Secondary | ICD-10-CM | POA: Insufficient documentation

## 2017-03-12 DIAGNOSIS — R296 Repeated falls: Secondary | ICD-10-CM | POA: Insufficient documentation

## 2017-03-12 DIAGNOSIS — R269 Unspecified abnormalities of gait and mobility: Secondary | ICD-10-CM | POA: Insufficient documentation

## 2017-03-12 DIAGNOSIS — E669 Obesity, unspecified: Secondary | ICD-10-CM | POA: Insufficient documentation

## 2017-03-12 DIAGNOSIS — M545 Low back pain: Secondary | ICD-10-CM | POA: Insufficient documentation

## 2017-03-16 ENCOUNTER — Ambulatory Visit: Payer: PPO | Admitting: Family

## 2017-03-17 DIAGNOSIS — R0602 Shortness of breath: Secondary | ICD-10-CM | POA: Diagnosis not present

## 2017-03-17 DIAGNOSIS — R5383 Other fatigue: Secondary | ICD-10-CM | POA: Diagnosis not present

## 2017-03-17 DIAGNOSIS — Z6838 Body mass index (BMI) 38.0-38.9, adult: Secondary | ICD-10-CM | POA: Diagnosis not present

## 2017-03-17 DIAGNOSIS — R5381 Other malaise: Secondary | ICD-10-CM | POA: Diagnosis not present

## 2017-03-22 ENCOUNTER — Other Ambulatory Visit (HOSPITAL_COMMUNITY): Payer: Self-pay | Admitting: Respiratory Therapy

## 2017-03-22 DIAGNOSIS — R0602 Shortness of breath: Secondary | ICD-10-CM

## 2017-03-23 DIAGNOSIS — H52223 Regular astigmatism, bilateral: Secondary | ICD-10-CM | POA: Diagnosis not present

## 2017-03-23 DIAGNOSIS — H5203 Hypermetropia, bilateral: Secondary | ICD-10-CM | POA: Diagnosis not present

## 2017-03-23 DIAGNOSIS — H25811 Combined forms of age-related cataract, right eye: Secondary | ICD-10-CM | POA: Diagnosis not present

## 2017-03-23 DIAGNOSIS — Z961 Presence of intraocular lens: Secondary | ICD-10-CM | POA: Diagnosis not present

## 2017-05-20 DIAGNOSIS — E1165 Type 2 diabetes mellitus with hyperglycemia: Secondary | ICD-10-CM | POA: Diagnosis not present

## 2017-05-20 DIAGNOSIS — E782 Mixed hyperlipidemia: Secondary | ICD-10-CM | POA: Diagnosis not present

## 2017-05-20 DIAGNOSIS — R05 Cough: Secondary | ICD-10-CM | POA: Diagnosis not present

## 2017-05-20 DIAGNOSIS — R0602 Shortness of breath: Secondary | ICD-10-CM | POA: Diagnosis not present

## 2017-05-20 DIAGNOSIS — Z6838 Body mass index (BMI) 38.0-38.9, adult: Secondary | ICD-10-CM | POA: Diagnosis not present

## 2017-05-20 DIAGNOSIS — R5381 Other malaise: Secondary | ICD-10-CM | POA: Diagnosis not present

## 2017-05-20 DIAGNOSIS — R310 Gross hematuria: Secondary | ICD-10-CM | POA: Diagnosis not present

## 2017-05-20 DIAGNOSIS — J019 Acute sinusitis, unspecified: Secondary | ICD-10-CM | POA: Diagnosis not present

## 2017-05-20 DIAGNOSIS — R5383 Other fatigue: Secondary | ICD-10-CM | POA: Diagnosis not present

## 2017-06-17 DIAGNOSIS — R310 Gross hematuria: Secondary | ICD-10-CM | POA: Diagnosis not present

## 2017-06-17 DIAGNOSIS — J019 Acute sinusitis, unspecified: Secondary | ICD-10-CM | POA: Diagnosis not present

## 2017-06-17 DIAGNOSIS — Z6838 Body mass index (BMI) 38.0-38.9, adult: Secondary | ICD-10-CM | POA: Diagnosis not present

## 2017-06-17 DIAGNOSIS — R0602 Shortness of breath: Secondary | ICD-10-CM | POA: Diagnosis not present

## 2017-06-17 DIAGNOSIS — E1165 Type 2 diabetes mellitus with hyperglycemia: Secondary | ICD-10-CM | POA: Diagnosis not present

## 2017-06-17 DIAGNOSIS — R5381 Other malaise: Secondary | ICD-10-CM | POA: Diagnosis not present

## 2017-06-17 DIAGNOSIS — R5383 Other fatigue: Secondary | ICD-10-CM | POA: Diagnosis not present

## 2017-06-17 DIAGNOSIS — R05 Cough: Secondary | ICD-10-CM | POA: Diagnosis not present

## 2017-06-29 DIAGNOSIS — E1165 Type 2 diabetes mellitus with hyperglycemia: Secondary | ICD-10-CM | POA: Diagnosis not present

## 2017-07-02 DIAGNOSIS — E559 Vitamin D deficiency, unspecified: Secondary | ICD-10-CM | POA: Diagnosis not present

## 2017-07-02 DIAGNOSIS — Z6838 Body mass index (BMI) 38.0-38.9, adult: Secondary | ICD-10-CM | POA: Diagnosis not present

## 2017-07-02 DIAGNOSIS — E1165 Type 2 diabetes mellitus with hyperglycemia: Secondary | ICD-10-CM | POA: Diagnosis not present

## 2017-07-02 DIAGNOSIS — R5383 Other fatigue: Secondary | ICD-10-CM | POA: Diagnosis not present

## 2017-07-02 DIAGNOSIS — G4730 Sleep apnea, unspecified: Secondary | ICD-10-CM | POA: Diagnosis not present

## 2017-07-02 DIAGNOSIS — R809 Proteinuria, unspecified: Secondary | ICD-10-CM | POA: Diagnosis not present

## 2017-07-02 DIAGNOSIS — E291 Testicular hypofunction: Secondary | ICD-10-CM | POA: Diagnosis not present

## 2017-07-02 DIAGNOSIS — E782 Mixed hyperlipidemia: Secondary | ICD-10-CM | POA: Diagnosis not present

## 2017-07-19 DIAGNOSIS — E1165 Type 2 diabetes mellitus with hyperglycemia: Secondary | ICD-10-CM | POA: Diagnosis not present

## 2017-07-19 DIAGNOSIS — G4730 Sleep apnea, unspecified: Secondary | ICD-10-CM | POA: Diagnosis not present

## 2017-07-19 DIAGNOSIS — R0602 Shortness of breath: Secondary | ICD-10-CM | POA: Diagnosis not present

## 2017-07-19 DIAGNOSIS — E782 Mixed hyperlipidemia: Secondary | ICD-10-CM | POA: Diagnosis not present

## 2017-08-04 DIAGNOSIS — E1165 Type 2 diabetes mellitus with hyperglycemia: Secondary | ICD-10-CM | POA: Diagnosis not present

## 2017-08-04 DIAGNOSIS — M545 Low back pain: Secondary | ICD-10-CM | POA: Diagnosis not present

## 2017-08-04 DIAGNOSIS — Z6838 Body mass index (BMI) 38.0-38.9, adult: Secondary | ICD-10-CM | POA: Diagnosis not present

## 2017-08-18 DIAGNOSIS — E1165 Type 2 diabetes mellitus with hyperglycemia: Secondary | ICD-10-CM | POA: Diagnosis not present

## 2017-08-18 DIAGNOSIS — E291 Testicular hypofunction: Secondary | ICD-10-CM | POA: Diagnosis not present

## 2017-08-18 DIAGNOSIS — E782 Mixed hyperlipidemia: Secondary | ICD-10-CM | POA: Diagnosis not present

## 2017-08-18 DIAGNOSIS — E559 Vitamin D deficiency, unspecified: Secondary | ICD-10-CM | POA: Diagnosis not present

## 2017-09-02 DIAGNOSIS — G4730 Sleep apnea, unspecified: Secondary | ICD-10-CM | POA: Diagnosis not present

## 2017-09-02 DIAGNOSIS — E559 Vitamin D deficiency, unspecified: Secondary | ICD-10-CM | POA: Diagnosis not present

## 2017-09-02 DIAGNOSIS — E1165 Type 2 diabetes mellitus with hyperglycemia: Secondary | ICD-10-CM | POA: Diagnosis not present

## 2017-09-02 DIAGNOSIS — E782 Mixed hyperlipidemia: Secondary | ICD-10-CM | POA: Diagnosis not present

## 2017-09-02 DIAGNOSIS — Z6838 Body mass index (BMI) 38.0-38.9, adult: Secondary | ICD-10-CM | POA: Diagnosis not present

## 2017-09-02 DIAGNOSIS — E291 Testicular hypofunction: Secondary | ICD-10-CM | POA: Diagnosis not present

## 2017-09-02 DIAGNOSIS — R809 Proteinuria, unspecified: Secondary | ICD-10-CM | POA: Diagnosis not present

## 2017-10-07 DIAGNOSIS — Z6837 Body mass index (BMI) 37.0-37.9, adult: Secondary | ICD-10-CM | POA: Diagnosis not present

## 2017-10-07 DIAGNOSIS — M545 Low back pain: Secondary | ICD-10-CM | POA: Diagnosis not present

## 2017-10-07 DIAGNOSIS — Z0000 Encounter for general adult medical examination without abnormal findings: Secondary | ICD-10-CM | POA: Diagnosis not present

## 2017-10-07 DIAGNOSIS — E1165 Type 2 diabetes mellitus with hyperglycemia: Secondary | ICD-10-CM | POA: Diagnosis not present

## 2017-10-08 DIAGNOSIS — Z23 Encounter for immunization: Secondary | ICD-10-CM | POA: Diagnosis not present

## 2017-10-26 DIAGNOSIS — G4730 Sleep apnea, unspecified: Secondary | ICD-10-CM | POA: Diagnosis not present

## 2017-10-26 DIAGNOSIS — E1165 Type 2 diabetes mellitus with hyperglycemia: Secondary | ICD-10-CM | POA: Diagnosis not present

## 2017-10-26 DIAGNOSIS — E559 Vitamin D deficiency, unspecified: Secondary | ICD-10-CM | POA: Diagnosis not present

## 2017-10-26 DIAGNOSIS — E291 Testicular hypofunction: Secondary | ICD-10-CM | POA: Diagnosis not present

## 2017-10-26 DIAGNOSIS — E782 Mixed hyperlipidemia: Secondary | ICD-10-CM | POA: Diagnosis not present

## 2017-11-08 DIAGNOSIS — Z23 Encounter for immunization: Secondary | ICD-10-CM | POA: Diagnosis not present

## 2017-11-08 DIAGNOSIS — E782 Mixed hyperlipidemia: Secondary | ICD-10-CM | POA: Diagnosis not present

## 2017-11-08 DIAGNOSIS — M545 Low back pain: Secondary | ICD-10-CM | POA: Diagnosis not present

## 2017-11-08 DIAGNOSIS — Z0000 Encounter for general adult medical examination without abnormal findings: Secondary | ICD-10-CM | POA: Diagnosis not present

## 2017-11-08 DIAGNOSIS — E559 Vitamin D deficiency, unspecified: Secondary | ICD-10-CM | POA: Diagnosis not present

## 2017-11-08 DIAGNOSIS — E291 Testicular hypofunction: Secondary | ICD-10-CM | POA: Diagnosis not present

## 2017-11-08 DIAGNOSIS — R5383 Other fatigue: Secondary | ICD-10-CM | POA: Diagnosis not present

## 2017-11-08 DIAGNOSIS — R5381 Other malaise: Secondary | ICD-10-CM | POA: Diagnosis not present

## 2017-11-08 DIAGNOSIS — R809 Proteinuria, unspecified: Secondary | ICD-10-CM | POA: Diagnosis not present

## 2017-11-08 DIAGNOSIS — Z6837 Body mass index (BMI) 37.0-37.9, adult: Secondary | ICD-10-CM | POA: Diagnosis not present

## 2017-11-08 DIAGNOSIS — R0602 Shortness of breath: Secondary | ICD-10-CM | POA: Diagnosis not present

## 2017-11-08 DIAGNOSIS — R310 Gross hematuria: Secondary | ICD-10-CM | POA: Diagnosis not present

## 2017-12-04 ENCOUNTER — Emergency Department (HOSPITAL_COMMUNITY): Payer: PPO

## 2017-12-04 ENCOUNTER — Other Ambulatory Visit: Payer: Self-pay

## 2017-12-04 ENCOUNTER — Emergency Department (HOSPITAL_COMMUNITY)
Admission: EM | Admit: 2017-12-04 | Discharge: 2017-12-04 | Disposition: A | Discharge status: Home / Self Care | Payer: PPO | Attending: Emergency Medicine | Admitting: Emergency Medicine

## 2017-12-04 ENCOUNTER — Encounter (HOSPITAL_COMMUNITY): Payer: Self-pay

## 2017-12-04 DIAGNOSIS — R4182 Altered mental status, unspecified: Secondary | ICD-10-CM | POA: Diagnosis not present

## 2017-12-04 DIAGNOSIS — Z79899 Other long term (current) drug therapy: Secondary | ICD-10-CM | POA: Insufficient documentation

## 2017-12-04 DIAGNOSIS — R2689 Other abnormalities of gait and mobility: Secondary | ICD-10-CM | POA: Insufficient documentation

## 2017-12-04 DIAGNOSIS — E119 Type 2 diabetes mellitus without complications: Secondary | ICD-10-CM | POA: Diagnosis not present

## 2017-12-04 DIAGNOSIS — S0990XA Unspecified injury of head, initial encounter: Secondary | ICD-10-CM | POA: Diagnosis not present

## 2017-12-04 DIAGNOSIS — F0390 Unspecified dementia without behavioral disturbance: Secondary | ICD-10-CM | POA: Diagnosis not present

## 2017-12-04 DIAGNOSIS — Z87891 Personal history of nicotine dependence: Secondary | ICD-10-CM | POA: Diagnosis not present

## 2017-12-04 DIAGNOSIS — R404 Transient alteration of awareness: Secondary | ICD-10-CM | POA: Diagnosis not present

## 2017-12-04 DIAGNOSIS — E1165 Type 2 diabetes mellitus with hyperglycemia: Secondary | ICD-10-CM | POA: Diagnosis not present

## 2017-12-04 DIAGNOSIS — R000 Tachycardia, unspecified: Secondary | ICD-10-CM | POA: Diagnosis not present

## 2017-12-04 DIAGNOSIS — S299XXA Unspecified injury of thorax, initial encounter: Secondary | ICD-10-CM | POA: Diagnosis not present

## 2017-12-04 DIAGNOSIS — S3993XA Unspecified injury of pelvis, initial encounter: Secondary | ICD-10-CM | POA: Diagnosis not present

## 2017-12-04 DIAGNOSIS — R269 Unspecified abnormalities of gait and mobility: Secondary | ICD-10-CM | POA: Diagnosis not present

## 2017-12-04 DIAGNOSIS — R262 Difficulty in walking, not elsewhere classified: Secondary | ICD-10-CM

## 2017-12-04 DIAGNOSIS — I4891 Unspecified atrial fibrillation: Secondary | ICD-10-CM | POA: Diagnosis not present

## 2017-12-04 DIAGNOSIS — I10 Essential (primary) hypertension: Secondary | ICD-10-CM | POA: Insufficient documentation

## 2017-12-04 DIAGNOSIS — I499 Cardiac arrhythmia, unspecified: Secondary | ICD-10-CM | POA: Diagnosis not present

## 2017-12-04 LAB — TROPONIN I: Troponin I: <0.03 ng/mL (ref <0.03)

## 2017-12-04 LAB — CBC WITH DIFFERENTIAL/PLATELET
Abs Immature Granulocytes: 0.11 10*3/uL — ABNORMAL HIGH (ref 0.00–0.07)
Basophils Absolute: 0.1 10*3/uL (ref 0.0–0.1)
Basophils Relative: 1 %
Eosinophils Absolute: 0.1 10*3/uL (ref 0.0–0.5)
Eosinophils Relative: 1 %
HCT: 41.6 % (ref 39.0–52.0)
HEMOGLOBIN: 13.1 g/dL (ref 13.0–17.0)
Immature Granulocytes: 1 %
LYMPHS ABS: 2 10*3/uL (ref 0.7–4.0)
Lymphocytes Relative: 12 %
MCH: 28.6 pg (ref 26.0–34.0)
MCHC: 31.5 g/dL (ref 30.0–36.0)
MCV: 90.8 fL (ref 80.0–100.0)
Monocytes Absolute: 1.4 10*3/uL — ABNORMAL HIGH (ref 0.1–1.0)
Monocytes Relative: 8 %
NRBC: 0.2 % (ref 0.0–0.2)
Neutro Abs: 13 10*3/uL — ABNORMAL HIGH (ref 1.7–7.7)
Neutrophils Relative %: 77 %
Platelets: 252 10*3/uL (ref 150–400)
RBC: 4.58 MIL/uL (ref 4.22–5.81)
RDW: 12.9 % (ref 11.5–15.5)
WBC: 16.7 10*3/uL — ABNORMAL HIGH (ref 4.0–10.5)

## 2017-12-04 LAB — AMMONIA: Ammonia: 14 umol/L (ref 9–35)

## 2017-12-04 LAB — URINALYSIS, ROUTINE W REFLEX MICROSCOPIC
Bacteria, UA: NONE SEEN
Bilirubin Urine: NEGATIVE
Glucose, UA: >=500 mg/dL — AB
Ketones, ur: NEGATIVE mg/dL
Leukocytes, UA: NEGATIVE
Nitrite: NEGATIVE
Protein, ur: 30 mg/dL — AB
Specific Gravity, Urine: 1.015 (ref 1.005–1.030)
pH: 5 (ref 5.0–8.0)

## 2017-12-04 LAB — COMPREHENSIVE METABOLIC PANEL
ALT: 19 U/L (ref 0–44)
AST: 17 U/L (ref 15–41)
Albumin: 3.8 g/dL (ref 3.5–5.0)
Alkaline Phosphatase: 61 U/L (ref 38–126)
Anion gap: 8 (ref 5–15)
BUN: 28 mg/dL — ABNORMAL HIGH (ref 8–23)
CO2: 22 mmol/L (ref 22–32)
Calcium: 9 mg/dL (ref 8.9–10.3)
Chloride: 108 mmol/L (ref 98–111)
Creatinine, Ser: 1.16 mg/dL (ref 0.61–1.24)
GFR calc Af Amer: >60 mL/min (ref >60)
GFR calc non Af Amer: 60 mL/min — ABNORMAL LOW (ref >60)
Glucose, Bld: 248 mg/dL — ABNORMAL HIGH (ref 70–99)
Potassium: 4 mmol/L (ref 3.5–5.1)
Sodium: 138 mmol/L (ref 135–145)
Total Bilirubin: 1.2 mg/dL (ref 0.3–1.2)
Total Protein: 7.2 g/dL (ref 6.5–8.1)

## 2017-12-04 LAB — CBG MONITORING, ED: Glucose-Capillary: 252 mg/dL — ABNORMAL HIGH (ref 70–99)

## 2017-12-04 MED ORDER — SODIUM CHLORIDE 0.9 % IV BOLUS
1000.0000 mL | Freq: Once | INTRAVENOUS | Status: AC
  Administered 2017-12-04: 1000 mL via INTRAVENOUS

## 2017-12-04 NOTE — ED Notes (Signed)
Urinal at bedside.  

## 2017-12-04 NOTE — ED Notes (Signed)
Family in room  

## 2017-12-04 NOTE — ED Provider Notes (Signed)
[facility] EMERGENCY DEPARTMENT Provider Note   CSN: [id number] Arrival date & time: 12/04/17  1859     History   Chief Complaint Chief Complaint  Patient presents with  . Altered Mental Status    HPI Christian Morris is a 77 y.o. male.  Pt presents to the ED today with AMS.  EMS was called to pt's house for a well being check.  The pt lives alone and has been falling a lot.  Pt said he's had difficulty walking for several months.  The falls have been going on for several months too.  He has seen PM&R for this problem back in February.  He has dementia, and the family was concerned about him.  He denies any pain.     Past Medical History:  Diagnosis Date  . Anxiety   . Arthritis    "legs" (02/21/2014)  . Chronic lower back pain   . Daily headache    "here lately" (02/21/2014)  . Depression   . Fatty liver disease, nonalcoholic   . GERD (gastroesophageal reflux disease)   . History of blood transfusion ~ 1958   "related to bleeding ulcers"  . History of gastric ulcer ~ 1958   no recurrence since.  . Hyperlipidemia   . Hypertension   . Hypogonadism male 01/11/2011  . Kidney stones   . Lung nodule    right 16 mm, seen initially 6/12. 11m in 08/2011.  . OSA on CPAP   . Squamous cell cancer of skin of forearm 01/23/2011   left  . Type II diabetes mellitus ([place]   . Urinary hesitancy   . Vitamin D deficiency 11/19/2014    Patient Active Problem List   Diagnosis Date Noted  . Memory loss 02/11/2015  . Vitamin D deficiency 11/19/2014  . Loss of weight 11/11/2014  . Hematuria 03/12/2014  . Frequent falls 11/14/2013  . Anemia 12/16/2012  . BPH (benign prostatic hyperplasia) 10/28/2012  . Nephrolithiasis 10/28/2012  . Environmental allergies 02/01/2012  . Squamous cell carcinoma in situ of skin of forearm 01/23/2011  . Hypogonadism male 01/11/2011  . Plantar fasciitis 05/19/2010  . ERECTILE DYSFUNCTION, ORGANIC 12/31/2009  . COPD (chronic obstructive pulmonary  disease) ([place] 08/16/2009  . GERD 08/16/2009  . Hepatic steatosis 08/16/2009  . LOW BACK PAIN, CHRONIC 08/16/2009  . MICROALBUMINURIA 08/16/2009  . Diabetes type 2, controlled ([place] 08/06/2009  . Hyperlipidemia 08/06/2009  . Obesity (BMI 30.0-34.9) 08/06/2009  . ANXIETY DEPRESSION 08/06/2009  . OSA (obstructive sleep apnea) 08/06/2009  . Essential hypertension 08/06/2009  . Osteoarthrosis involving lower leg 08/06/2009  . PUD, HX OF 08/06/2009  . COLONIC POLYPS, HX OF 08/06/2009    Past Surgical History:  Procedure Laterality Date  . CARDIAC CATHETERIZATION    . CATARACT EXTRACTION W/ INTRAOCULAR LENS IMPLANT Left 08/2009   Dr GKaty Fitch . LEFT HEART CATHETERIZATION WITH CORONARY ANGIOGRAM N/A 11/17/2012   Procedure: LEFT HEART CATHETERIZATION WITH CORONARY ANGIOGRAM;  Surgeon: CBurnell Blanks MD;  [facility];  Service: Cardiovascular;  Laterality: N/A;  . MELANOMA EXCISION Left 01/23/2011   forearm        Home Medications    Prior to Admission medications   Medication Sig Start Date End Date Taking? Authorizing Provider  acetaminophen (TYLENOL) 500 MG tablet Take 500 mg by mouth every 6 (six) hours as needed.   Yes [provider]  atorvastatin (LIPITOR) 40 MG tablet Take 1 tablet (40 mg total) daily by mouth. 11/16/16  Yes OInda Castle  Melissa, NP  diclofenac sodium (VOLTAREN) 1 % GEL Apply 2 g topically 4 (four) times daily. 02/12/17  Yes Jamse Arn, MD  donepezil (ARICEPT) 5 MG tablet  12/08/16  Yes [provider]  fish oil-omega-3 fatty acids 1000 MG capsule Take 2 g by mouth 2 (two) times daily.     Yes [provider]  glucose blood (ONETOUCH VERIO) test strip Use to check blood sugar two times a day.  DX  E11.9 09/25/16  Yes [name], NP  hydrOXYzine (ATARAX/VISTARIL) 25 MG tablet Take 0.5 tablets (12.5 mg total) by mouth every 8 (eight) hours as needed for itching. 12/31/16  Yes Mesner, Corene Cornea, MD  lisinopril  (PRINIVIL,ZESTRIL) 5 MG tablet Take 1 tablet (5 mg total) by mouth daily. 08/14/16  Yes [name], NP  metFORMIN (GLUCOPHAGE) 500 MG tablet TAKE TWO TABLETS BY MOUTH TWICE DAILY WITH FOOD 11/16/16  Yes [name], NP  methocarbamol (ROBAXIN-750) 750 MG tablet Take 1 tablet (750 mg total) by mouth 2 (two) times daily as needed for muscle spasms. 02/12/17  Yes Jamse Arn, MD  ONE [facility] LANCETS MISC Use to check blood sugar twice a day.  DX E11.9 09/25/16  Yes [name], NP  pantoprazole (PROTONIX) 40 MG tablet Take 1 tablet (40 mg total) daily by mouth. 11/16/16  Yes [name], NP  PARoxetine (PAXIL) 30 MG tablet 1 tablet by mouth once daily 11/16/16  Yes [name], NP  permethrin (ELIMITE) 5 % cream Apply to affected area once on January 1st if symptoms not improving 12/31/16  Yes Mesner, Corene Cornea, MD  tamsulosin (FLOMAX) 0.4 MG CAPS capsule Take 1 capsule (0.4 mg total) daily by mouth. 11/16/16  Yes [name], NP  Blood Glucose Monitoring Suppl (ONETOUCH VERIO) w/Device KIT 1 kit by Does not apply route daily. Use to check blood sugar once a day.  Dx  E11.9 09/23/16   [name], NP  Blood Pressure Monitoring [facility]) MISC Use to check blood pressure daily if needed.  DX I10 09/23/16   [name], NP  nitroGLYCERIN (NITROSTAT) 0.4 MG SL tablet Place 1 tablet (0.4 mg total) under the tongue every 5 (five) minutes x 3 doses as needed for chest pain (donot take with viagra). 11/04/16   [name], NP    Family History Family History  Problem Relation Age of Onset  . Arthritis Mother   . Heart disease Sister        Massive MI age 76.  . Arrhythmia Brother   . Melanoma Son   . Heart attack Brother     Social History Social History   Tobacco Use  . Smoking status: Former Smoker    Packs/day: 1.00    Years: 25.00    Pack years: 25.00    Types: Cigarettes    Last attempt to quit: 01/05/1982    Years  since quitting: 35.9  . Smokeless tobacco: Never Used  Substance Use Topics  . Alcohol use: No    Frequency: Never  . Drug use: No     Allergies   Patient has no known allergies.   Review of Systems Review of Systems  Musculoskeletal:       Trouble walking  All other systems reviewed and are negative.    Physical Exam Updated Vital Signs BP (!) 142/74   Pulse 99   Temp 98.6 F (37 C) (Oral)   Resp 16   Ht 5' 8"  (1.727 m)   Wt 119.5 kg  SpO2 100%   BMI 40.06 kg/m   Physical Exam  Constitutional: He appears well-developed and well-nourished.  HENT:  Head: Normocephalic and atraumatic.  Right Ear: External ear normal.  Left Ear: External ear normal.  Nose: Nose normal.  Mouth/Throat: Oropharynx is clear and moist.  Eyes: Pupils are equal, round, and reactive to light. Conjunctivae and EOM are normal.  Neck: Normal range of motion. Neck supple.  Cardiovascular: Normal rate, regular rhythm, normal heart sounds and intact distal pulses.  Pulmonary/Chest: Effort normal and breath sounds normal.  Abdominal: Soft. Bowel sounds are normal.  Musculoskeletal: Normal range of motion.  Neurological: He is alert.  Pt knows his name, where he is.  He thinks it's December (which it will be in a few hrs)  Skin: Skin is warm. Capillary refill takes less than 2 seconds.  Psychiatric: He has a normal mood and affect. His behavior is normal. Judgment and thought content normal.  Nursing note and vitals reviewed.    ED Treatments / Results  Labs (all labs ordered are listed, but only abnormal results are displayed) Labs Reviewed  CBC WITH DIFFERENTIAL/PLATELET - Abnormal; Notable for the following components:      Result Value   WBC 16.7 (*)    Neutro Abs 13.0 (*)    Monocytes Absolute 1.4 (*)    Abs Immature Granulocytes 0.11 (*)    All other components within normal limits  COMPREHENSIVE METABOLIC PANEL - Abnormal; Notable for the following components:   Glucose, Bld  248 (*)    BUN 28 (*)    GFR calc non Af Amer 60 (*)    All other components within normal limits  URINALYSIS, ROUTINE W REFLEX MICROSCOPIC - Abnormal; Notable for the following components:   APPearance HAZY (*)    Glucose, UA >=500 (*)    Hgb urine dipstick SMALL (*)    Protein, ur 30 (*)    All other components within normal limits  CBG MONITORING, ED - Abnormal; Notable for the following components:   Glucose-Capillary 252 (*)    All other components within normal limits  AMMONIA  TROPONIN I    EKG EKG Interpretation  Date/Time:  Saturday December 04 2017 19:11:22 EST Ventricular Rate:  115 PR Interval:    QRS Duration: 113 QT Interval:  344 QTC Calculation: 464 R Axis:   -63 Text Interpretation:  Sinus tachycardia Multiple premature complexes, vent & supraven Left anterior fascicular block Abnormal R-wave progression, late transition LVH with secondary repolarization abnormality Artifact in lead(s) I III aVR aVL Since last tracing rate faster Confirmed by Isla Pence [phone]) on 12/04/2017 7:45:27 PM   Radiology Dg Chest 2 View  Result Date: 12/04/2017 CLINICAL DATA:  Atrial fibrillation.  Frequent falls. EXAM: CHEST - 2 VIEW COMPARISON:  August 16, 2016 FINDINGS: The heart size and mediastinal contours are within normal limits. Both lungs are clear. The visualized skeletal structures are unremarkable. IMPRESSION: No active cardiopulmonary disease. Electronically Signed   By: Dorise Bullion III M.D   On: 12/04/2017 21:28   Dg Pelvis 1-2 Views  Result Date: 12/04/2017 CLINICAL DATA:  Recommend falls. EXAM: PELVIS - 1-2 VIEW COMPARISON:  None. FINDINGS: There is no evidence of pelvic fracture or diastasis. No pelvic bone lesions are seen. IMPRESSION: Negative. Electronically Signed   By: Dorise Bullion III M.D   On: 12/04/2017 21:28   Ct Head Wo Contrast  Result Date: 12/04/2017 CLINICAL DATA:  Frequent falls. EXAM: CT HEAD WITHOUT CONTRAST TECHNIQUE: Contiguous  axial  images were obtained from the base of the skull through the vertex without intravenous contrast. COMPARISON:  12/31/2014 FINDINGS: Brain: Exam demonstrates mild age related atrophic change. There is minimal chronic ischemic microvascular disease. There is no mass, mass effect, shift of midline structures or acute hemorrhage. No evidence of acute infarction. Vascular: No hyperdense vessel or unexpected calcification. Skull: Normal. Negative for fracture or focal lesion. Sinuses/Orbits: No acute finding. Other: None. IMPRESSION: No acute findings. Mild chronic ischemic microvascular disease and age related atrophic change. Electronically Signed   By: Marin Olp M.D.   On: 12/04/2017 21:30    Procedures Procedures (including critical care time)  Medications Ordered in ED Medications  sodium chloride 0.9 % bolus 1,000 mL (0 mLs Intravenous Stopped 12/04/17 2055)     Initial Impression / Assessment and Plan / ED Course  I have reviewed the triage vital signs and the nursing notes.  Pertinent labs & imaging results that were available during my care of the patient were reviewed by me and considered in my medical decision making (see chart for details).    I spoke with pt's daughter regarding exactly what prompted EMS to be called tonight.  This pt is mostly estranged from his family as he left when the kids were little.  The daughter is the only one who will check on him.  Her mother has just been diagnosed with Lewy body dementia and she's been caring for her.  She does not feel like she is able to take care of both parents.    Unfortunately, it looks like pt has had a progressive decline for months, and there has been no plan for assisted living, home health aide, etc.  There is nothing to acutely admit pt for tonight.  Pt is able to ambulate with minimal assistance per nurses.  The pt is instructed to use his cane as needed at home.  WBC is slightly elevated, but UA ok.  No pna.  No  fever.    I will put a home health consult for SW/PT/nurse.  Pt is not sure if he'll accept it, but we will try.  The daughter is not sure if pt has been taking his meds.  Nursing can help with that.  I will also order a walker.  Pt is stable for d/c home.   Final Clinical Impressions(s) / ED Diagnoses   Final diagnoses:  Ambulatory dysfunction    ED Discharge Orders         [place]     12/04/17 2216    Face-to-face encounter (required for Medicare/Medicaid patients)    Comments:  I Isla Pence certify that this patient is under my care and that I, or a nurse practitioner or physician's assistant working with me, had a face-to-face encounter that meets the physician face-to-face encounter requirements with this patient on 12/04/2017. The encounter with the patient was in whole, or in part for the following medical condition(s) which is the primary reason for home health care (List medical condition): ambulatory dysfunction   12/04/17 2216    For home use only DME 4 wheeled rolling walker with seat     12/04/17 2217           Isla Pence, MD 12/04/17 2218

## 2017-12-04 NOTE — ED Triage Notes (Addendum)
EMS called for a wellbeing check. Pt from home. Pt has bene having frequent falls. Alert to person.   Pt has history of a fib shown on the monitor. VSS stable. 18 g l hand given 18ml NS with ems.

## 2017-12-04 NOTE — ED Notes (Signed)
Patient transported to CT 

## 2017-12-04 NOTE — ED Notes (Addendum)
Patient ambulated in hallway with walker. Pt was able to ambulate with minimal assistance. EDP notified of the same

## 2017-12-05 DIAGNOSIS — R260 Ataxic gait: Secondary | ICD-10-CM | POA: Diagnosis not present

## 2017-12-06 ENCOUNTER — Telehealth: Payer: Self-pay | Admitting: Family

## 2017-12-06 NOTE — Telephone Encounter (Signed)
CAlled patient to schedule ED follow up. Left message for return call.

## 2017-12-06 NOTE — Telephone Encounter (Signed)
Please contact patient to arrange ED follow up with me.

## 2017-12-09 DIAGNOSIS — E1165 Type 2 diabetes mellitus with hyperglycemia: Secondary | ICD-10-CM | POA: Diagnosis not present

## 2017-12-09 DIAGNOSIS — E291 Testicular hypofunction: Secondary | ICD-10-CM | POA: Diagnosis not present

## 2017-12-09 DIAGNOSIS — R809 Proteinuria, unspecified: Secondary | ICD-10-CM | POA: Diagnosis not present

## 2017-12-09 DIAGNOSIS — E559 Vitamin D deficiency, unspecified: Secondary | ICD-10-CM | POA: Diagnosis not present

## 2017-12-09 DIAGNOSIS — E782 Mixed hyperlipidemia: Secondary | ICD-10-CM | POA: Diagnosis not present

## 2017-12-12 ENCOUNTER — Encounter: Payer: Self-pay | Admitting: Family

## 2017-12-12 NOTE — Telephone Encounter (Signed)
Will mail letter.  

## 2017-12-13 ENCOUNTER — Ambulatory Visit (INDEPENDENT_AMBULATORY_CARE_PROVIDER_SITE_OTHER): Payer: PPO | Admitting: Otolaryngology

## 2017-12-13 DIAGNOSIS — R809 Proteinuria, unspecified: Secondary | ICD-10-CM | POA: Diagnosis not present

## 2017-12-13 DIAGNOSIS — E782 Mixed hyperlipidemia: Secondary | ICD-10-CM | POA: Diagnosis not present

## 2017-12-13 DIAGNOSIS — E118 Type 2 diabetes mellitus with unspecified complications: Secondary | ICD-10-CM | POA: Diagnosis not present

## 2017-12-13 DIAGNOSIS — Z6835 Body mass index (BMI) 35.0-35.9, adult: Secondary | ICD-10-CM | POA: Diagnosis not present

## 2017-12-13 DIAGNOSIS — F0390 Unspecified dementia without behavioral disturbance: Secondary | ICD-10-CM | POA: Diagnosis not present

## 2017-12-13 DIAGNOSIS — E559 Vitamin D deficiency, unspecified: Secondary | ICD-10-CM | POA: Diagnosis not present

## 2017-12-14 ENCOUNTER — Other Ambulatory Visit: Payer: Self-pay

## 2017-12-14 NOTE — Patient Outreach (Signed)
[place] [facility]) Care Management  12/14/2017  Christian Morris [birth year] [id number]   Telephone Screen  Referral Date: 12/13/17 Referral Source: MD office Referral Reason: "evaluate living situation and review med adherence" Insurance: HTA   Outreach attempt # 1 to patient. No answer at both numbers listed for patient. Main number is for patient's daughter. Alternate number voicemail full and unable to leave message.      Plan: RN CM will make outreach attempt to patient within 3-4 business days. RN CM will send unsuccessful outreach letter to patient.    Enzo Montgomery, RN,BSN,CCM [place] Management Telephonic Care Management Coordinator Direct Phone: [phone] Toll Free: [phone] Fax: [phone]

## 2017-12-16 ENCOUNTER — Other Ambulatory Visit: Payer: Self-pay

## 2017-12-16 NOTE — Patient Outreach (Signed)
[place] [facility]) Care Management  12/16/2017  Christian Morris 04-May-1940 [id number]   Telephone Screen  Referral Date: 12/13/17 Referral Source: MD office Referral Reason: "evaluate living situation and review med adherence" Insurance: HTA   Outreach attempt #2 to patient. RN CM contacted both numbers for patient and no answer at present and unable to leave message.     Plan: RN CM will make outreach attempt to patient within 3-4 business days.  Christian Montgomery, RN,BSN,CCM [place] Management Telephonic Care Management Coordinator Direct Phone: [phone] Toll Free: [phone] Fax: [phone]

## 2017-12-16 NOTE — Patient Outreach (Signed)
[place] [facility]) Care Management  12/16/2017  Christian Morris October 17, 1940 [id number]   Telephone Screen  Referral Date:12/13/17 Referral Source:MD office Referral Reason:"evaluate living situation and review med adherence" Insurance:HTA   Incoming call from patient's daughter-Christian Morris returning RN CM call. Advised daughter that RN CM was trying to reach patient to discuss MD referral. Daughter provided RN CM with correct phone number for [phone]. RN CM attempted outreach to patient at new number. No answer at present. RN CM left HIPAA compliant voicemail message along with contact info.     Plan: RN CM will make outreach attempt to patient within 3-4 business days.   Enzo Montgomery, RN,BSN,CCM [place] Management Telephonic Care Management Coordinator Direct Phone: [phone] Toll Free: [phone] Fax: [phone]

## 2017-12-21 ENCOUNTER — Other Ambulatory Visit: Payer: Self-pay

## 2017-12-21 NOTE — Patient Outreach (Signed)
[place] [facility]) Care Management  12/21/2017  Christian Morris 01-Aug-1940 [id number]   Telephone Screen  Referral Date:12/13/17 Referral Source:MD office Referral Reason:"evaluate living situation and review med adherence" Insurance:HTA   Outreach attempt #3 to patient. No answer at present. RN CM left HIPAA compliant voicemail message along with contact info.      Plan: RN CM will close case if no response from letter mailed to patient.   Enzo Montgomery, RN,BSN,CCM [place] Management Telephonic Care Management Coordinator Direct Phone: [phone] Toll Free: [phone] Fax: [phone]

## 2017-12-30 ENCOUNTER — Other Ambulatory Visit: Payer: Self-pay

## 2017-12-30 DIAGNOSIS — E1165 Type 2 diabetes mellitus with hyperglycemia: Secondary | ICD-10-CM | POA: Diagnosis not present

## 2017-12-30 DIAGNOSIS — E782 Mixed hyperlipidemia: Secondary | ICD-10-CM | POA: Diagnosis not present

## 2017-12-30 NOTE — Patient Outreach (Signed)
[place] [facility]) Care Management  12/30/2017  Christian Morris [birth year] [id number]   Telephone Screen  Referral Date:12/13/17 Referral Source:MD office Referral Reason:"evaluate living situation and review med adherence" Insurance:HTA   Multiple attempts to establish contact with patient without success. No response from letter mailed to patient. Case is being closed at this time.    Plan: RN CM will close case at this time. RN CM will send MD case closure letter.   Enzo Montgomery, RN,BSN,CCM [place] Management Telephonic Care Management Coordinator Direct Phone: [phone] Toll Free: [phone] Fax: [phone]

## 2018-01-10 DIAGNOSIS — G4730 Sleep apnea, unspecified: Secondary | ICD-10-CM | POA: Diagnosis not present

## 2018-01-10 DIAGNOSIS — E118 Type 2 diabetes mellitus with unspecified complications: Secondary | ICD-10-CM | POA: Diagnosis not present

## 2018-01-10 DIAGNOSIS — E559 Vitamin D deficiency, unspecified: Secondary | ICD-10-CM | POA: Diagnosis not present

## 2018-01-10 DIAGNOSIS — E782 Mixed hyperlipidemia: Secondary | ICD-10-CM | POA: Diagnosis not present

## 2018-01-10 DIAGNOSIS — F0390 Unspecified dementia without behavioral disturbance: Secondary | ICD-10-CM | POA: Diagnosis not present

## 2018-01-10 DIAGNOSIS — N400 Enlarged prostate without lower urinary tract symptoms: Secondary | ICD-10-CM | POA: Diagnosis not present

## 2018-01-10 DIAGNOSIS — R808 Other proteinuria: Secondary | ICD-10-CM | POA: Diagnosis not present

## 2018-01-11 ENCOUNTER — Other Ambulatory Visit: Payer: Self-pay

## 2018-01-11 NOTE — Patient Outreach (Addendum)
[name]  01/11/2018  Christian Morris 01/21/1940 [id number]   Telephone Screen  Referral Date:12/13/17 Referral Source:MD office Referral Reason:"evaluate living situation and review med adherence" Insurance:HTA    Per request from MD office outreach attempt #4 to patient. Patient reached on his cell phone([phone]). Screening completed.  Social: Patient resides in his home alone. He has a son and daughter but they are not really active and involved in his care. He reports that he is independent with ADLs/IADLs. He denies any recent falls although EMR indicates "multiple falls" in the past. Patient states he uses a walker to assist him with getting around. He is still able to drive himself around. Patient is currently active with [facility] APS services due to concerns about him being able to manage his care in the home.   Conditions: Per chart review, patient has PMH of dementia, multiple falls, anxiety, arthritis, GERD,HLD,HTN, OSA-CPAP and Diabetes. Patient admits that he is not managing his medical conditions very well. He states that he only checks his blood sugars when he "feels funny." Per records last A1C 8.0(Aug 2019). Patient voices that he has issues with breathing at night related to OSA but does not uses CPAP and has not done so for a while now. Patient does not seem very knowledgeable regarding his medical conditions and how to manage them. PCP concerned about how safely and appropriately patient managing medical conditions alone in the home.  Medications: Patient unsure of how many meds he is taking but reported more than 10. He states that someone was supposed to get him a pill box but never did. He is currently taking pills directly from bottle. He voices some confusion regarding managing meds at times. Patient assessed regarding being able to afford meds and he voiced "getting by" but did not wish to elaborate.    Appointments: Patient saw PCP on 01/10/2018.  Advance Directives: None. Declined info.  Consent: [facility] services reviewed and discussed with patient. Verbal consent for service given by patient. RN CM stressed the importance of answering his phone and returning voicemail messages. RN CM discussed with patient [facility] policy of only making three outreach phone attempts and sending letter to try to reach patient. He voiced understanding.   Plan: RN CM will send [place] referral for medication mgmt assistance. RN CM will send [facility] community nurse referral for further in home eval/assessment of care needs, mgmt of chronic conditions. RN CM will send [facility] SW referral for further evaluation of in home situation and coordination of care with APS. RN CM will update MD office and APS case worker on status of call.  Enzo Montgomery, RN,BSN,CCM [place] Management Telephonic Care Management Coordinator Direct Phone: [phone] Toll Free: [phone] Fax: [phone]

## 2018-01-11 NOTE — Patient Outreach (Signed)
[facility]) Care Management  01/11/2018  [place] 09-01-1940 [id number]  Successful call to [place] Con[place], PharmD with [facility] at Dr. Juel Burrow office.  Informed him of referral for this patient to make outreach to complete medication review and assess for medication management and medication assistance needs.  Plan: Close [name] case.  Joetta Manners, PharmD [place] [phone]

## 2018-01-11 NOTE — Patient Outreach (Signed)
[facility]) Care Management  01/11/2018  Christian Morris 1940-02-23 [id number]     Care Coordination  RN CM received voicemail message from [place] at Dr. Juel Burrow office requesting a call back. RN CM placed return call to [name]. She voices that patient was in the office on yesterday to see PCP. He has several pill bottles and concerns regarding patient being able to manage his meds. APS is active and involved in patient's case. She reports that she gave APS case worker-[name] RN CM contact info and RN CM should be expecting a call from case worker. She states that case worker wants to schedule joint unannounced home visit with [name] staff. Advised that [facility] does not make "pop up" home visits. Patient must be able to be reached by phone,consent to services and home visit scheduled and arranged with patient. Office staff only has one alternate number for patient and that is his daughter-[name] who is estranged and not involved in patient's care. RN CM informed them that RN CM spoke with daughter during outreach attempt process and daughter did not wish to be involved but did provide patient's cell phone number which RN CM attempted to reach multiple times with no success. Advised that RN CM would make another attempt to reach patient by phone per MD office request. Also, patient has appt again with PCP in a few weeks. RN CM encouraged office staff to contact [facility] while patient in the office to discuss services and get patient's consent.   Voicemail message received from [place] case worker requesting return call and asking if [facility] staff can do an unannounced visit to the patient's home.    Plan: RN CM will make outreach attempt to patient. RN CM will follow up with MD office and APS regarding status of outreach attempt.  Enzo Montgomery, RN,BSN,CCM [facility] Telephonic Care Management Coordinator Direct Phone: [phone] Toll Free: [phone] Fax: [phone]

## 2018-01-11 NOTE — Patient Outreach (Signed)
[facility]) Care Management  01/11/2018  ODAS OZER 10/11/1940 [id number]   Care Coordination    RN CM contacted Jeani Hawking, case worker at [place]. Discussed and reviewed patient case and that [facility] can not make unscheduled/unannounced homevisits. Advised her that RN CM had made one final attempt to reach patient today and was successful. Patient agreed to services(RN,SW and pharmacist). Also,informed her that RN CM has placed APS case worker contact info in referral for SW for further coordination of care as needed.   RN CM placed call to PCP office to speak with Caryl Pina. Voicemail message left for her updating her on patient phone call and status of [facility] referral.    Enzo Montgomery, RN,BSN,CCM [place] [facility] Telephonic Care Management Coordinator Direct Phone: [phone] Toll Free: [phone] Fax: [phone]

## 2018-01-12 ENCOUNTER — Telehealth: Payer: Self-pay | Admitting: *Deleted

## 2018-01-12 NOTE — Telephone Encounter (Signed)
Received Physician Orders in error from [facility]; forwarded to correct provider: Dr. Shelby Mattocks, MD [place], [place] via mail/SLS 01/08

## 2018-01-14 ENCOUNTER — Other Ambulatory Visit: Payer: Self-pay | Admitting: *Deleted

## 2018-01-14 NOTE — Patient Outreach (Signed)
Telephone call to patient to schedule initial home visit, no answer to [phone] and received message " calling restrictions", no answer to [phone] and received message " voicemail not set up".  PLAN Outreach pt in 3-4 business days  Jacqlyn Larsen [facility], [place] [phone]

## 2018-01-19 ENCOUNTER — Other Ambulatory Visit: Payer: Self-pay | Admitting: *Deleted

## 2018-01-19 NOTE — Patient Outreach (Signed)
Second attempt outreach to schedule initial home visit, no answer to either telephone [phone] and received message " calling restrictions", [phone] received message " not accepting calls at this time".  RN CM mailed unsuccessful outreach letter to pt home.  PLAN Outreach in 3-4 business days  Jacqlyn Larsen [facility], [place] [phone]

## 2018-01-24 ENCOUNTER — Other Ambulatory Visit: Payer: Self-pay | Admitting: *Deleted

## 2018-01-24 NOTE — Patient Outreach (Signed)
Third attempt outreach call to pt, [phone] no answer and states " there are calling restrictions", [phone] no answer and " not accepting calls"  PLAN Close case in 3-4 business days if no response from pt  Jacqlyn Larsen [facility] [place], [place] [phone]

## 2018-01-27 ENCOUNTER — Other Ambulatory Visit: Payer: Self-pay | Admitting: *Deleted

## 2018-01-27 NOTE — Patient Outreach (Signed)
Case closure for RN CM due to unable to reach pt.  [facility] CSW still active.  RN CM faxed note to primary MD Dr. Nevada Crane and informed of RN CM case closure, faxed letter to pt informing of RN CM case closure.  PLAN Case closure for RN CM  Jacqlyn Larsen [facility], [place] Coordinator [phone]

## 2018-03-07 ENCOUNTER — Encounter (HOSPITAL_COMMUNITY): Payer: Self-pay | Admitting: Emergency Medicine

## 2018-03-07 ENCOUNTER — Emergency Department (HOSPITAL_COMMUNITY)
Admission: EM | Admit: 2018-03-07 | Discharge: 2018-03-07 | Disposition: A | Discharge status: Home / Self Care | Payer: PPO | Attending: Emergency Medicine | Admitting: Emergency Medicine

## 2018-03-07 ENCOUNTER — Emergency Department (HOSPITAL_COMMUNITY): Payer: PPO

## 2018-03-07 ENCOUNTER — Other Ambulatory Visit: Payer: Self-pay

## 2018-03-07 DIAGNOSIS — I10 Essential (primary) hypertension: Secondary | ICD-10-CM | POA: Diagnosis not present

## 2018-03-07 DIAGNOSIS — B9789 Other viral agents as the cause of diseases classified elsewhere: Secondary | ICD-10-CM | POA: Insufficient documentation

## 2018-03-07 DIAGNOSIS — J069 Acute upper respiratory infection, unspecified: Secondary | ICD-10-CM | POA: Insufficient documentation

## 2018-03-07 DIAGNOSIS — Z87891 Personal history of nicotine dependence: Secondary | ICD-10-CM | POA: Insufficient documentation

## 2018-03-07 DIAGNOSIS — R05 Cough: Secondary | ICD-10-CM | POA: Diagnosis not present

## 2018-03-07 DIAGNOSIS — Z7984 Long term (current) use of oral hypoglycemic drugs: Secondary | ICD-10-CM | POA: Insufficient documentation

## 2018-03-07 DIAGNOSIS — Z79899 Other long term (current) drug therapy: Secondary | ICD-10-CM | POA: Diagnosis not present

## 2018-03-07 DIAGNOSIS — Z85828 Personal history of other malignant neoplasm of skin: Secondary | ICD-10-CM | POA: Diagnosis not present

## 2018-03-07 LAB — COMPREHENSIVE METABOLIC PANEL
ALT: 17 U/L (ref 0–44)
ANION GAP: 10 (ref 5–15)
AST: 19 U/L (ref 15–41)
Albumin: 3.9 g/dL (ref 3.5–5.0)
Alkaline Phosphatase: 69 U/L (ref 38–126)
BUN: 30 mg/dL — ABNORMAL HIGH (ref 8–23)
CO2: 21 mmol/L — ABNORMAL LOW (ref 22–32)
Calcium: 9.3 mg/dL (ref 8.9–10.3)
Chloride: 106 mmol/L (ref 98–111)
Creatinine, Ser: 1.07 mg/dL (ref 0.61–1.24)
GFR calc Af Amer: >60 mL/min (ref >60)
GFR calc non Af Amer: >60 mL/min (ref >60)
Glucose, Bld: 203 mg/dL — ABNORMAL HIGH (ref 70–99)
Potassium: 3.8 mmol/L (ref 3.5–5.1)
Sodium: 137 mmol/L (ref 135–145)
Total Bilirubin: 0.7 mg/dL (ref 0.3–1.2)
Total Protein: 7.4 g/dL (ref 6.5–8.1)

## 2018-03-07 LAB — CBC WITH DIFFERENTIAL/PLATELET
Abs Immature Granulocytes: 0.11 10*3/uL — ABNORMAL HIGH (ref 0.00–0.07)
Basophils Absolute: 0.1 10*3/uL (ref 0.0–0.1)
Basophils Relative: 1 %
Eosinophils Absolute: 0.2 10*3/uL (ref 0.0–0.5)
Eosinophils Relative: 2 %
HCT: 44 % (ref 39.0–52.0)
Hemoglobin: 14.2 g/dL (ref 13.0–17.0)
Immature Granulocytes: 1 %
Lymphocytes Relative: 17 %
Lymphs Abs: 1.8 10*3/uL (ref 0.7–4.0)
MCH: 29.2 pg (ref 26.0–34.0)
MCHC: 32.3 g/dL (ref 30.0–36.0)
MCV: 90.5 fL (ref 80.0–100.0)
Monocytes Absolute: 1.1 10*3/uL — ABNORMAL HIGH (ref 0.1–1.0)
Monocytes Relative: 11 %
NEUTROS PCT: 68 %
Neutro Abs: 7.3 10*3/uL (ref 1.7–7.7)
Platelets: 237 10*3/uL (ref 150–400)
RBC: 4.86 MIL/uL (ref 4.22–5.81)
RDW: 12.6 % (ref 11.5–15.5)
WBC: 10.6 10*3/uL — ABNORMAL HIGH (ref 4.0–10.5)
nRBC: 0 % (ref 0.0–0.2)

## 2018-03-07 LAB — URINALYSIS, ROUTINE W REFLEX MICROSCOPIC
Bacteria, UA: NONE SEEN
Bilirubin Urine: NEGATIVE
Glucose, UA: >=500 mg/dL — AB
Ketones, ur: NEGATIVE mg/dL
LEUKOCYTE UA: NEGATIVE
Nitrite: NEGATIVE
Protein, ur: 30 mg/dL — AB
Specific Gravity, Urine: 1.017 (ref 1.005–1.030)
pH: 5 (ref 5.0–8.0)

## 2018-03-07 LAB — INFLUENZA PANEL BY PCR (TYPE A & B)
Influenza A By PCR: NEGATIVE
Influenza B By PCR: NEGATIVE

## 2018-03-07 LAB — MAGNESIUM: Magnesium: 1.7 mg/dL (ref 1.7–2.4)

## 2018-03-07 MED ORDER — BENZONATATE 200 MG PO CAPS: 200.0000 mg | ORAL_CAPSULE | Freq: Three times a day (TID) | ORAL | 0 refills | Status: AC | PRN

## 2018-03-07 MED ORDER — ACETAMINOPHEN 325 MG PO TABS
650.0000 mg | ORAL_TABLET | Freq: Once | ORAL | Status: AC
  Administered 2018-03-07: 650 mg via ORAL
  Filled 2018-03-07: qty 2

## 2018-03-07 MED ORDER — SODIUM CHLORIDE 0.9 % IV BOLUS
500.0000 mL | Freq: Once | INTRAVENOUS | Status: AC
  Administered 2018-03-07: 500 mL via INTRAVENOUS

## 2018-03-07 NOTE — ED Triage Notes (Signed)
PT c/o productive clear sputum cough with nasal congestion, bilateral ear pressure and body aches x3 days.

## 2018-03-07 NOTE — Discharge Instructions (Addendum)
Is important that you take your medications daily and check your blood sugar every day.  Drink plenty of fluids.  Call Dr. Juel Burrow office this week for recheck.  Return to the ER for any worsening symptoms.

## 2018-03-07 NOTE — ED Notes (Signed)
Patient transported to X-ray 

## 2018-03-07 NOTE — Care Management (Signed)
CM to see pt in ED. Per RN pt's [facility] provider unable to make visit and unable to contact pt via phone d/t pt not having a phone. Per pt he is always at home, he does not have a phone because he does not have money to pay for a phone. Pt poor historian. He tells this CM he has called "[employer]" and there are 3 providers in this area but he couldn't find any of them. He has always used [employer] in the past. CM does not understand if pt just has no done the work to get minutes on his phone or if he truly cannot afford minutes. He does not know the name of the [place] but it is a lady names "Morey Hummingbird" from [employer]. CM verified he is not active with [facility]. Per chart review. Per chart review pt discharged from [facility] d/t not being able to call pt. Pt lives with his wife. A friend is coming to pick pt up from ED. Pt encouraged to stop by [employer] after he leaves ED and put minutes on his phone if it is something he can afford.

## 2018-03-07 NOTE — ED Provider Notes (Signed)
[facility] EMERGENCY DEPARTMENT Provider Note   CSN: [id number] Arrival date & time: 03/07/18  4628    History   Chief Complaint Chief Complaint  Patient presents with  . Cough    HPI Christian Morris is a 78 y.o. male.     HPI   Christian Morris is a 78 y.o. male with past medical history of diabetes, hyperlipidemia, hypertension, presents to the Emergency Department complaining of generalized body aches, bilateral ear pain and pressure, nasal congestion, and cough.  Symptoms have been present for 3 to 4 days.  He describes his cough as intermittent and productive of clear sputum.  No known flu exposure.  He is unsure of fever at home as he lives alone and has not taken his temperature.  He endorses decreased appetite but continues to drink fluids.  He reports having a frontal headache but states that he has a headache every day and the headache is unchanged.  He denies vomiting, chest or abdominal pain, shortness of breath, peripheral edema, numbness pain or weakness of his extremities, and neck pain or stiffness.  No history of CHF.    Past Medical History:  Diagnosis Date  . Anxiety   . Arthritis    "legs" (02/21/2014)  . Chronic lower back pain   . Daily headache    "here lately" (02/21/2014)  . Depression   . Fatty liver disease, nonalcoholic   . GERD (gastroesophageal reflux disease)   . History of blood transfusion ~ 1958   "related to bleeding ulcers"  . History of gastric ulcer ~ 1958   no recurrence since.  . Hyperlipidemia   . Hypertension   . Hypogonadism male 01/11/2011  . Kidney stones   . Lung nodule    right 16 mm, seen initially 6/12. 65m in 08/2011.  . OSA on CPAP   . Squamous cell cancer of skin of forearm 01/23/2011   left  . Type II diabetes mellitus ([facility]   . Urinary hesitancy   . Vitamin D deficiency 11/19/2014    Patient Active Problem List   Diagnosis Date Noted  . Memory loss 02/11/2015  . Vitamin D deficiency 11/19/2014  . Loss of weight  11/11/2014  . Hematuria 03/12/2014  . Frequent falls 11/14/2013  . Anemia 12/16/2012  . BPH (benign prostatic hyperplasia) 10/28/2012  . Nephrolithiasis 10/28/2012  . Environmental allergies 02/01/2012  . Squamous cell carcinoma in situ of skin of forearm 01/23/2011  . Hypogonadism male 01/11/2011  . Plantar fasciitis 05/19/2010  . ERECTILE DYSFUNCTION, ORGANIC 12/31/2009  . COPD (chronic obstructive pulmonary disease) ([facility] 08/16/2009  . GERD 08/16/2009  . Hepatic steatosis 08/16/2009  . LOW BACK PAIN, CHRONIC 08/16/2009  . MICROALBUMINURIA 08/16/2009  . Diabetes type 2, controlled ([facility] 08/06/2009  . Hyperlipidemia 08/06/2009  . Obesity (BMI 30.0-34.9) 08/06/2009  . ANXIETY DEPRESSION 08/06/2009  . OSA (obstructive sleep apnea) 08/06/2009  . Essential hypertension 08/06/2009  . Osteoarthrosis involving lower leg 08/06/2009  . PUD, HX OF 08/06/2009  . COLONIC POLYPS, HX OF 08/06/2009    Past Surgical History:  Procedure Laterality Date  . CARDIAC CATHETERIZATION    . CATARACT EXTRACTION W/ INTRAOCULAR LENS IMPLANT Left 08/2009   Dr GKaty Fitch . LEFT HEART CATHETERIZATION WITH CORONARY ANGIOGRAM N/A 11/17/2012   Procedure: LEFT HEART CATHETERIZATION WITH CORONARY ANGIOGRAM;  Surgeon: CBurnell Blanks MD;  [facility];  Service: Cardiovascular;  Laterality: N/A;  . MELANOMA EXCISION Left 01/23/2011   forearm  Home Medications    Prior to Admission medications   Medication Sig Start Date End Date Taking? Authorizing Provider  acetaminophen (TYLENOL) 500 MG tablet Take 500 mg by mouth every 6 (six) hours as needed.    [provider]  atorvastatin (LIPITOR) 40 MG tablet Take 1 tablet (40 mg total) daily by mouth. 11/16/16   [name], NP  Blood Glucose Monitoring Suppl (ONETOUCH VERIO) w/Device KIT 1 kit by Does not apply route daily. Use to check blood sugar once a day.  Dx  E11.9 09/23/16   [name], NP  Blood Pressure  Monitoring [facility]) MISC Use to check blood pressure daily if needed.  DX I10 09/23/16   [name], NP  diclofenac sodium (VOLTAREN) 1 % GEL Apply 2 g topically 4 (four) times daily. 02/12/17   Jamse Arn, MD  donepezil (ARICEPT) 5 MG tablet  12/08/16   [provider]  fish oil-omega-3 fatty acids 1000 MG capsule Take 2 g by mouth 2 (two) times daily.      [provider]  glucose blood (ONETOUCH VERIO) test strip Use to check blood sugar two times a day.  DX  E11.9 09/25/16   [name], NP  hydrOXYzine (ATARAX/VISTARIL) 25 MG tablet Take 0.5 tablets (12.5 mg total) by mouth every 8 (eight) hours as needed for itching. 12/31/16   Mesner, Corene Cornea, MD  lisinopril (PRINIVIL,ZESTRIL) 5 MG tablet Take 1 tablet (5 mg total) by mouth daily. 08/14/16   [name], NP  metFORMIN (GLUCOPHAGE) 500 MG tablet TAKE TWO TABLETS BY MOUTH TWICE DAILY WITH FOOD 11/16/16   [name], NP  methocarbamol (ROBAXIN-750) 750 MG tablet Take 1 tablet (750 mg total) by mouth 2 (two) times daily as needed for muscle spasms. 02/12/17   Jamse Arn, MD  nitroGLYCERIN (NITROSTAT) 0.4 MG SL tablet Place 1 tablet (0.4 mg total) under the tongue every 5 (five) minutes x 3 doses as needed for chest pain (donot take with viagra). 11/04/16   [name], NP  ONE TOUCH LANCETS MISC Use to check blood sugar twice a day.  DX E11.9 09/25/16   [name], NP  pantoprazole (PROTONIX) 40 MG tablet Take 1 tablet (40 mg total) daily by mouth. 11/16/16   [name], NP  PARoxetine (PAXIL) 30 MG tablet 1 tablet by mouth once daily 11/16/16   [name], NP  permethrin (ELIMITE) 5 % cream Apply to affected area once on January 1st if symptoms not improving 12/31/16   Mesner, Corene Cornea, MD  tamsulosin (FLOMAX) 0.4 MG CAPS capsule Take 1 capsule (0.4 mg total) daily by mouth. 11/16/16   [name], NP    Family History Family History    Problem Relation Age of Onset  . Arthritis Mother   . Heart disease Sister        Massive MI age 12.  . Arrhythmia Brother   . Melanoma Son   . Heart attack Brother     Social History Social History   Tobacco Use  . Smoking status: Former Smoker    Packs/day: 1.00    Years: 25.00    Pack years: 25.00    Types: Cigarettes    Last attempt to quit: 01/05/1982    Years since quitting: 36.1  . Smokeless tobacco: Never Used  Substance Use Topics  . Alcohol use: No    Frequency: Never  . Drug use: No     Allergies   Patient has no known allergies.   Review of  Systems Review of Systems  Constitutional: Negative for activity change, appetite change, chills and fever.  HENT: Positive for congestion, ear pain and rhinorrhea. Negative for facial swelling, sore throat and trouble swallowing.   Eyes: Negative for visual disturbance.  Respiratory: Positive for cough. Negative for shortness of breath, wheezing and stridor.   Gastrointestinal: Negative for abdominal pain, diarrhea, nausea and vomiting.  Genitourinary: Negative for dysuria and flank pain.  Musculoskeletal: Positive for myalgias. Negative for neck pain and neck stiffness.  Skin: Negative for rash.  Neurological: Negative for dizziness, syncope, speech difficulty, weakness, numbness and headaches.  Hematological: Negative for adenopathy.  Psychiatric/Behavioral: Negative for confusion.     Physical Exam Updated Vital Signs BP (!) 128/102 (BP Location: Left Arm)   Pulse 72   Temp 98.6 F (37 C) (Oral)   Resp 20   Ht 6' (1.829 m)   Wt 108.9 kg   SpO2 96%   BMI 32.55 kg/m   Physical Exam Vitals signs and nursing note reviewed.  Constitutional:      General: He is not in acute distress.    Appearance: Normal appearance. He is well-developed.  HENT:     Head: Normocephalic and atraumatic.     Jaw: No trismus.     Right Ear: Tympanic membrane and ear canal normal.     Left Ear: Tympanic membrane and ear  canal normal.     Nose: Mucosal edema and rhinorrhea present.     Mouth/Throat:     Pharynx: Uvula midline. Posterior oropharyngeal erythema present. No oropharyngeal exudate or uvula swelling.     Tonsils: No tonsillar abscesses.  Eyes:     Conjunctiva/sclera: Conjunctivae normal.  Neck:     Musculoskeletal: Normal range of motion and neck supple.     Trachea: Phonation normal.     Meningeal: Brudzinski's sign and Kernig's sign absent.  Cardiovascular:     Rate and Rhythm: Normal rate and regular rhythm.     Pulses: Normal pulses.     Heart sounds: No murmur.  Pulmonary:     Effort: Pulmonary effort is normal. No respiratory distress.     Breath sounds: Normal breath sounds. No wheezing or rales.  Abdominal:     General: There is no distension.     Palpations: Abdomen is soft.     Tenderness: There is no abdominal tenderness. There is no guarding or rebound.  Musculoskeletal: Normal range of motion.     Right lower leg: No edema.     Left lower leg: No edema.  Lymphadenopathy:     Cervical: No cervical adenopathy.  Skin:    General: Skin is warm.     Findings: No rash.  Neurological:     General: No focal deficit present.     Mental Status: He is alert and oriented to person, place, and time.     GCS: GCS eye subscore is 4. GCS verbal subscore is 5. GCS motor subscore is 6.     Sensory: Sensation is intact.     Motor: Motor function is intact. No weakness or abnormal muscle tone.     Coordination: Coordination normal.     Gait: Gait is intact.     Comments: CN II-XII intact, speech clear, no pronator drift      ED Treatments / Results  Labs (all labs ordered are listed, but only abnormal results are displayed) Labs Reviewed  COMPREHENSIVE METABOLIC PANEL - Abnormal; Notable for the following components:      Result Value  CO2 21 (*)    Glucose, Bld 203 (*)    BUN 30 (*)    All other components within normal limits  CBC WITH DIFFERENTIAL/PLATELET - Abnormal;  Notable for the following components:   WBC 10.6 (*)    Monocytes Absolute 1.1 (*)    Abs Immature Granulocytes 0.11 (*)    All other components within normal limits  URINALYSIS, ROUTINE W REFLEX MICROSCOPIC - Abnormal; Notable for the following components:   APPearance CLOUDY (*)    Glucose, UA >=500 (*)    Hgb urine dipstick MODERATE (*)    Protein, ur 30 (*)    All other components within normal limits  URINE CULTURE  INFLUENZA PANEL BY PCR (TYPE A & B)  MAGNESIUM    EKG EKG Interpretation  Date/Time:  Monday March 07 2018 09:19:01 EST Ventricular Rate:  69 PR Interval:    QRS Duration: 102 QT Interval:  564 QTC Calculation: 605 R Axis:   -20 Text Interpretation:  Sinus rhythm Borderline left axis deviation Abnormal R-wave progression, early transition Nonspecific T abnrm, anterolateral leads Prolonged QT interval Confirmed by Elnora Morrison [id number]) on 03/07/2018 9:49:47 AM  Second EKG performed after patient received IV fluids, reviewed by Dr. Reather Converse.  Second EKG shows previous prolonged QT interval now normalized, sinus rhythm is present and no acute findings     Radiology Dg Chest 2 View  Result Date: 03/07/2018 CLINICAL DATA:  78 year old male with cough, congestion and body aches for the past 3 days EXAM: CHEST - 2 VIEW COMPARISON:  Prior chest x-ray 12/04/2017; prior chest CT 11/09/2014 FINDINGS: The lungs are clear and negative for focal airspace consolidation, pulmonary edema or suspicious pulmonary nodule. Stable right posterior lower lobe pulmonary nodule dating back to prior CT imaging from 2016, likely a benign and partially calcified granuloma. No pleural effusion or pneumothorax. Cardiac and mediastinal contours are within normal limits. No acute fracture or lytic or blastic osseous lesions. The visualized upper abdominal bowel gas pattern is unremarkable. IMPRESSION: No active cardiopulmonary disease. Electronically Signed   By: Jacqulynn Cadet M.D.   On:  03/07/2018 09:15    Procedures Procedures (including critical care time)  Medications Ordered in ED Medications  sodium chloride 0.9 % bolus 500 mL (0 mLs Intravenous Stopped 03/07/18 1213)  acetaminophen (TYLENOL) tablet 650 mg (650 mg Oral Given 03/07/18 1119)     Initial Impression / Assessment and Plan / ED Course  I have reviewed the triage vital signs and the nursing notes.  Pertinent labs & imaging results that were available during my care of the patient were reviewed by me and considered in my medical decision making (see chart for details).        Patient overall well-appearing.  Nontoxic.  No focal neuro deficits on his exam.  Influenza testing negative.  Patient mildly hyperglycemic with anion gap within normal limits. No DKA.  Urine culture pending, doubt UTI. Pt also seen by Dr. Reather Converse  Initially, EKG findings showed prolonged QT interval, after IV fluids a repeat EKG was obtained and QT now normalized with sinus rhythm.  Heart rate 66.  Without acute findings.  Repeat EKG reviewed by Dr. Reather Converse  Patient appears appropriate for discharge home, prescription for [facility] for his cough.  I feel his symptoms are viral.  I have discussed importance of close outpatient follow-up.  Patient verbalized understanding agrees to plan.  Return precautions were discussed  Final Clinical Impressions(s) / ED Diagnoses   Final diagnoses:  Upper respiratory tract infection, unspecified type    ED Discharge Orders    None       Kem Parkinson, PA-C 03/08/18 1503    Elnora Morrison, MD 03/08/18 1626

## 2018-03-07 NOTE — ED Notes (Signed)
Pt removed his own IV.  Came out to desk requesting d/c papers.  Did not want repeat vital signs.

## 2018-03-07 NOTE — ED Notes (Signed)
Called and left message for Patriciaann Clan, adult protection services, to call ED about patient's visit today.

## 2018-03-09 LAB — URINE CULTURE

## 2018-03-28 DIAGNOSIS — R808 Other proteinuria: Secondary | ICD-10-CM | POA: Diagnosis not present

## 2018-03-28 DIAGNOSIS — F0390 Unspecified dementia without behavioral disturbance: Secondary | ICD-10-CM | POA: Diagnosis not present

## 2018-03-28 DIAGNOSIS — G4730 Sleep apnea, unspecified: Secondary | ICD-10-CM | POA: Diagnosis not present

## 2018-03-28 DIAGNOSIS — N400 Enlarged prostate without lower urinary tract symptoms: Secondary | ICD-10-CM | POA: Diagnosis not present

## 2018-03-28 DIAGNOSIS — E559 Vitamin D deficiency, unspecified: Secondary | ICD-10-CM | POA: Diagnosis not present

## 2018-03-28 DIAGNOSIS — E782 Mixed hyperlipidemia: Secondary | ICD-10-CM | POA: Diagnosis not present

## 2018-03-28 DIAGNOSIS — E118 Type 2 diabetes mellitus with unspecified complications: Secondary | ICD-10-CM | POA: Diagnosis not present

## 2018-04-01 ENCOUNTER — Emergency Department (HOSPITAL_COMMUNITY): Payer: PPO

## 2018-04-01 ENCOUNTER — Other Ambulatory Visit: Payer: Self-pay

## 2018-04-01 ENCOUNTER — Emergency Department (HOSPITAL_COMMUNITY)
Admission: EM | Admit: 2018-04-01 | Discharge: 2018-04-01 | Disposition: A | Discharge status: Home / Self Care | Payer: PPO | Attending: Emergency Medicine | Admitting: Emergency Medicine

## 2018-04-01 ENCOUNTER — Encounter (HOSPITAL_COMMUNITY): Payer: Self-pay

## 2018-04-01 DIAGNOSIS — J449 Chronic obstructive pulmonary disease, unspecified: Secondary | ICD-10-CM | POA: Diagnosis not present

## 2018-04-01 DIAGNOSIS — I10 Essential (primary) hypertension: Secondary | ICD-10-CM | POA: Insufficient documentation

## 2018-04-01 DIAGNOSIS — Z7984 Long term (current) use of oral hypoglycemic drugs: Secondary | ICD-10-CM | POA: Diagnosis not present

## 2018-04-01 DIAGNOSIS — R1012 Left upper quadrant pain: Secondary | ICD-10-CM | POA: Insufficient documentation

## 2018-04-01 DIAGNOSIS — E119 Type 2 diabetes mellitus without complications: Secondary | ICD-10-CM | POA: Insufficient documentation

## 2018-04-01 DIAGNOSIS — N200 Calculus of kidney: Secondary | ICD-10-CM | POA: Diagnosis not present

## 2018-04-01 DIAGNOSIS — Z79899 Other long term (current) drug therapy: Secondary | ICD-10-CM | POA: Insufficient documentation

## 2018-04-01 DIAGNOSIS — R1084 Generalized abdominal pain: Secondary | ICD-10-CM

## 2018-04-01 DIAGNOSIS — R1033 Periumbilical pain: Secondary | ICD-10-CM | POA: Diagnosis not present

## 2018-04-01 DIAGNOSIS — N1330 Unspecified hydronephrosis: Secondary | ICD-10-CM | POA: Diagnosis not present

## 2018-04-01 LAB — URINALYSIS, ROUTINE W REFLEX MICROSCOPIC
Bacteria, UA: NONE SEEN
Bilirubin Urine: NEGATIVE
Glucose, UA: >=500 mg/dL — AB
Ketones, ur: NEGATIVE mg/dL
Leukocytes,Ua: NEGATIVE
NITRITE: NEGATIVE
Protein, ur: 30 mg/dL — AB
Specific Gravity, Urine: 1.011 (ref 1.005–1.030)
pH: 5 (ref 5.0–8.0)

## 2018-04-01 LAB — LIPASE, BLOOD: Lipase: 48 U/L (ref 11–51)

## 2018-04-01 LAB — CBC WITH DIFFERENTIAL/PLATELET
Abs Immature Granulocytes: 0.08 10*3/uL — ABNORMAL HIGH (ref 0.00–0.07)
Basophils Absolute: 0.1 10*3/uL (ref 0.0–0.1)
Basophils Relative: 1 %
Eosinophils Absolute: 0.3 10*3/uL (ref 0.0–0.5)
Eosinophils Relative: 3 %
HCT: 47 % (ref 39.0–52.0)
Hemoglobin: 15.2 g/dL (ref 13.0–17.0)
Immature Granulocytes: 1 %
Lymphocytes Relative: 27 %
Lymphs Abs: 2.8 10*3/uL (ref 0.7–4.0)
MCH: 29 pg (ref 26.0–34.0)
MCHC: 32.3 g/dL (ref 30.0–36.0)
MCV: 89.7 fL (ref 80.0–100.0)
Monocytes Absolute: 0.9 10*3/uL (ref 0.1–1.0)
Monocytes Relative: 9 %
Neutro Abs: 6.4 10*3/uL (ref 1.7–7.7)
Neutrophils Relative %: 59 %
Platelets: 296 10*3/uL (ref 150–400)
RBC: 5.24 MIL/uL (ref 4.22–5.81)
RDW: 12.8 % (ref 11.5–15.5)
WBC: 10.6 10*3/uL — AB (ref 4.0–10.5)
nRBC: 0 % (ref 0.0–0.2)

## 2018-04-01 LAB — COMPREHENSIVE METABOLIC PANEL
ALT: 17 U/L (ref 0–44)
AST: 20 U/L (ref 15–41)
Albumin: 4.2 g/dL (ref 3.5–5.0)
Alkaline Phosphatase: 66 U/L (ref 38–126)
Anion gap: 10 (ref 5–15)
BUN: 25 mg/dL — ABNORMAL HIGH (ref 8–23)
CO2: 23 mmol/L (ref 22–32)
Calcium: 9.5 mg/dL (ref 8.9–10.3)
Chloride: 105 mmol/L (ref 98–111)
Creatinine, Ser: 1.15 mg/dL (ref 0.61–1.24)
GFR calc Af Amer: >60 mL/min (ref >60)
GFR calc non Af Amer: >60 mL/min (ref >60)
Glucose, Bld: 184 mg/dL — ABNORMAL HIGH (ref 70–99)
POTASSIUM: 3.6 mmol/L (ref 3.5–5.1)
Sodium: 138 mmol/L (ref 135–145)
Total Bilirubin: 0.8 mg/dL (ref 0.3–1.2)
Total Protein: 7.8 g/dL (ref 6.5–8.1)

## 2018-04-01 LAB — TROPONIN I: Troponin I: <0.03 ng/mL (ref <0.03)

## 2018-04-01 LAB — POC OCCULT BLOOD, ED: Fecal Occult Bld: NEGATIVE

## 2018-04-01 MED ORDER — IOHEXOL 300 MG/ML  SOLN
100.0000 mL | Freq: Once | INTRAMUSCULAR | Status: AC | PRN
  Administered 2018-04-01: 100 mL via INTRAVENOUS

## 2018-04-01 NOTE — ED Provider Notes (Signed)
[facility] EMERGENCY DEPARTMENT Provider Note   CSN: [id number] Arrival date & time: 04/01/18  7510    History   Chief Complaint Chief Complaint  Patient presents with   Abdominal Pain    HPI Christian Morris is a 78 y.o. male.     Patient is a 78 year old male with a history of gastric ulcers, hypertension, arthritis, anxiety, and type 2 diabetes who presents to the emergency department because of abdominal pain.  The patient states this problem started on last night, March 26.  The patient states he continues to have pain extending into this morning.  He is not had any fever that he is aware of.  He has not had any injury to the abdomen.  No vomiting or diarrhea or constipation.  The last bowel movement was on yesterday and was normal according to the patient.  No recent changes in medication or diet.  No recent procedures on the abdomen area.  Patient denies any blood in the urine or any blood in the stool.  He presents now for assistance with this issue.  The history is provided by the patient.  Abdominal Pain  Associated symptoms: no chest pain, no constipation, no cough, no diarrhea, no dysuria, no hematuria, no nausea, no shortness of breath and no vomiting     Past Medical History:  Diagnosis Date   Anxiety    Arthritis    "legs" (02/21/2014)   Chronic lower back pain    Daily headache    "here lately" (02/21/2014)   Depression    Fatty liver disease, nonalcoholic    GERD (gastroesophageal reflux disease)    History of blood transfusion ~ 1958   "related to bleeding ulcers"   History of gastric ulcer ~ 1958   no recurrence since.   Hyperlipidemia    Hypertension    Hypogonadism male 01/11/2011   Kidney stones    Lung nodule    right 16 mm, seen initially 6/12. 39m in 08/2011.   OSA on CPAP    Squamous cell cancer of skin of forearm 01/23/2011   left   Type II diabetes mellitus ([place]    Urinary hesitancy    Vitamin D deficiency 11/19/2014     Patient Active Problem List   Diagnosis Date Noted   Memory loss 02/11/2015   Vitamin D deficiency 11/19/2014   Loss of weight 11/11/2014   Hematuria 03/12/2014   Frequent falls 11/14/2013   Anemia 12/16/2012   BPH (benign prostatic hyperplasia) 10/28/2012   Nephrolithiasis 10/28/2012   Environmental allergies 02/01/2012   Squamous cell carcinoma in situ of skin of forearm 01/23/2011   Hypogonadism male 01/11/2011   Plantar fasciitis 05/19/2010   ERECTILE DYSFUNCTION, ORGANIC 12/31/2009   COPD (chronic obstructive pulmonary disease) ([place] 08/16/2009   GERD 08/16/2009   Hepatic steatosis 08/16/2009   LOW BACK PAIN, CHRONIC 08/16/2009   MICROALBUMINURIA 08/16/2009   Diabetes type 2, controlled ([place] 08/06/2009   Hyperlipidemia 08/06/2009   Obesity (BMI 30.0-34.9) 08/06/2009   ANXIETY DEPRESSION 08/06/2009   OSA (obstructive sleep apnea) 08/06/2009   Essential hypertension 08/06/2009   Osteoarthrosis involving lower leg 08/06/2009   PUD, HX OF 08/06/2009   COLONIC POLYPS, HX OF 08/06/2009    Past Surgical History:  Procedure Laterality Date   CARDIAC CATHETERIZATION     CATARACT EXTRACTION W/ INTRAOCULAR LENS IMPLANT Left 08/2009   Dr GKaty Fitch  LEFT HEART CATHETERIZATION WITH CORONARY ANGIOGRAM N/A 11/17/2012   Procedure: LEFT HEART CATHETERIZATION WITH CORONARY ANGIOGRAM;  Surgeon: Burnell Blanks, MD;  Location[facility] C[facility] LAB;  Service: Cardiovascular;  Laterality: N/A;   MELANOMA EXCISION Left 01/23/2011   forearm        Home Medications    Prior to Admission medications   Medication Sig Start Date End Date Taking? Authorizing Provider  acetaminophen (TYLENOL) 500 MG tablet Take 500 mg by mouth every 6 (six) hours as needed.    [provider]  atorvastatin (LIPITOR) 40 MG tablet Take 1 tablet (40 mg total) daily by mouth. 11/16/16   [name], NP  benzonatate (TESSALON) 200 MG capsule Take 1 capsule (200 mg  total) by mouth 3 (three) times daily as needed for cough. Swallow whole, do not chew 03/07/18   Triplett, Tammy, PA-C  Blood Glucose Monitoring Suppl (ONETOUCH VERIO) w/Device KIT 1 kit by Does not apply route daily. Use to check blood sugar once a day.  Dx  E11.9 09/23/16   [name], NP  Blood Pressure Monitoring [facility]) MISC Use to check blood pressure daily if needed.  DX I10 09/23/16   [name], NP  diclofenac sodium (VOLTAREN) 1 % GEL Apply 2 g topically 4 (four) times daily. 02/12/17   Jamse Arn, MD  donepezil (ARICEPT) 5 MG tablet  12/08/16   [provider]  fish oil-omega-3 fatty acids 1000 MG capsule Take 2 g by mouth 2 (two) times daily.      [provider]  glucose blood (ONETOUCH VERIO) test strip Use to check blood sugar two times a day.  DX  E11.9 09/25/16   [name], NP  hydrOXYzine (ATARAX/VISTARIL) 25 MG tablet Take 0.5 tablets (12.5 mg total) by mouth every 8 (eight) hours as needed for itching. 12/31/16   Mesner, Corene Cornea, MD  lisinopril (PRINIVIL,ZESTRIL) 5 MG tablet Take 1 tablet (5 mg total) by mouth daily. 08/14/16   [name], NP  metFORMIN (GLUCOPHAGE) 500 MG tablet TAKE TWO TABLETS BY MOUTH TWICE DAILY WITH FOOD 11/16/16   [name], NP  methocarbamol (ROBAXIN-750) 750 MG tablet Take 1 tablet (750 mg total) by mouth 2 (two) times daily as needed for muscle spasms. 02/12/17   Jamse Arn, MD  nitroGLYCERIN (NITROSTAT) 0.4 MG SL tablet Place 1 tablet (0.4 mg total) under the tongue every 5 (five) minutes x 3 doses as needed for chest pain (donot take with viagra). 11/04/16   [name], NP  ONE TOUCH LANCETS MISC Use to check blood sugar twice a day.  DX E11.9 09/25/16   [name], NP  pantoprazole (PROTONIX) 40 MG tablet Take 1 tablet (40 mg total) daily by mouth. 11/16/16   [name], NP  PARoxetine (PAXIL) 30 MG tablet 1 tablet by mouth once daily 11/16/16    [name], NP  permethrin (ELIMITE) 5 % cream Apply to affected area once on January 1st if symptoms not improving 12/31/16   Mesner, Corene Cornea, MD  tamsulosin (FLOMAX) 0.4 MG CAPS capsule Take 1 capsule (0.4 mg total) daily by mouth. 11/16/16   [name], NP    Family History Family History  Problem Relation Age of Onset   Arthritis Mother    Heart disease Sister        Massive MI age 34.   Arrhythmia Brother    Melanoma Son    Heart attack Brother     Social History Social History   Tobacco Use   Smoking status: Former Smoker    Packs/day: 1.00    Years: 25.00  Pack years: 25.00    Types: Cigarettes    Last attempt to quit: 01/05/1982    Years since quitting: 36.2   Smokeless tobacco: Never Used  Substance Use Topics   Alcohol use: No    Frequency: Never   Drug use: No     Allergies   Patient has no known allergies.   Review of Systems Review of Systems  Constitutional: Negative for activity change.       All ROS Neg except as noted in HPI  HENT: Negative for nosebleeds.   Eyes: Negative for photophobia and discharge.  Respiratory: Negative for cough, shortness of breath and wheezing.   Cardiovascular: Negative for chest pain and palpitations.  Gastrointestinal: Positive for abdominal pain. Negative for blood in stool, constipation, diarrhea, nausea and vomiting.  Genitourinary: Negative for dysuria, frequency and hematuria.  Musculoskeletal: Negative for arthralgias, back pain and neck pain.  Skin: Negative.   Neurological: Negative for dizziness, seizures and speech difficulty.  Psychiatric/Behavioral: Negative for confusion and hallucinations.     Physical Exam Updated Vital Signs BP 134/87 (BP Location: Right Arm)    Pulse 84    Temp 98.2 F (36.8 C) (Oral)    Resp 17    Ht 6' (1.829 m)    Wt 108 kg    SpO2 100%    BMI 32.29 kg/m   Physical Exam Vitals signs and nursing note reviewed.  Constitutional:      Appearance: He  is well-developed. He is not toxic-appearing.  HENT:     Head: Normocephalic.     Right Ear: Tympanic membrane and external ear normal.     Left Ear: Tympanic membrane and external ear normal.  Eyes:     General: Lids are normal.     Pupils: Pupils are equal, round, and reactive to light.  Neck:     Musculoskeletal: Normal range of motion and neck supple.     Vascular: No carotid bruit.  Cardiovascular:     Rate and Rhythm: Normal rate and regular rhythm.     Pulses: Normal pulses.     Heart sounds: Normal heart sounds.  Pulmonary:     Effort: No respiratory distress.     Breath sounds: Normal breath sounds.  Abdominal:     General: Bowel sounds are normal.     Palpations: Abdomen is soft.     Tenderness: There is abdominal tenderness in the periumbilical area, suprapubic area and left upper quadrant. There is no guarding.  Musculoskeletal: Normal range of motion.  Lymphadenopathy:     Head:     Right side of head: No submandibular adenopathy.     Left side of head: No submandibular adenopathy.     Cervical: No cervical adenopathy.  Skin:    General: Skin is warm and dry.  Neurological:     Mental Status: He is alert and oriented to person, place, and time.     Cranial Nerves: No cranial nerve deficit.     Sensory: No sensory deficit.  Psychiatric:        Speech: Speech normal.      ED Treatments / Results  Labs (all labs ordered are listed, but only abnormal results are displayed) Labs Reviewed  CBC WITH DIFFERENTIAL/PLATELET  COMPREHENSIVE METABOLIC PANEL  LIPASE, BLOOD  URINALYSIS, ROUTINE W REFLEX MICROSCOPIC  TROPONIN I  POC OCCULT BLOOD, ED    EKG None  Radiology No results found.  Procedures Procedures (including critical care time)  Medications Ordered in ED Medications -  No data to display   Initial Impression / Assessment and Plan / ED Course  I have reviewed the triage vital signs and the nursing notes.  Pertinent labs & imaging results  that were available during my care of the patient were reviewed by me and considered in my medical decision making (see chart for details).          Final Clinical Impressions(s) / ED Diagnoses MDM  Vital signs within normal limits.  Pulse oximetry is 100% on room air.  Within normal limits by my interpretation.  Patient complains of abdominal pain.  Patient has a history of gastric ulcers.  Stool for occult blood is negative today.  Complete blood count shows the white blood cells to be slightly elevated at 10,600, within the usual range for this patient.  The remainder of the complete blood count is within normal limits.  The comprehensive metabolic panel shows the BUN to be slightly elevated at 25, the glucose elevated at 184, otherwise well within normal limits.  The anion gap is normal at 10.   Recheck.  Patient states he is feeling some better.  No vomiting, no diarrhea, no chills.   Lipase is normal at 48.  Doubt pancreas as a contributor to the patient's pain. Urine analysis shows greater than 500 mg of glucose the test is otherwise negative.  There are uric acid crystals present.  The troponin is less than 0.03. CT scan with contrast shows no evidence of bowel obstruction or acute bowel inflammation.  There is a chronic large burden nephrolithiasis in the right renal collecting system including a staghorn calculus.  There is also chronic right hydronephrosis.  There is question of a layering bladder stone present.  The prostate is enlarged.  No acute findings on the CT scan.  Case reviewed with Dr Regenia Skeeter.  I discussed the findings with the patient in terms of which he understands.  The patient states that he feels much better now, and he would like to go home.     Final diagnoses:  Diffuse abdominal pain    ED Discharge Orders    None       Lily Kocher, PA-C 04/01/18 [place], MD 04/01/18 [phone]

## 2018-04-01 NOTE — Discharge Instructions (Addendum)
Your vital signs are within normal limits.  Your lab test are nonacute.  The CT scan of your abdomen is also nonacute.  Please observe your stool for any black or tarry looking stool, or blood in your stool.  Please observe for any temperature elevations.  Please see your primary physician if any changes in your condition, or worsening of your symptoms.  Return to the emergency department if any changes, problems, or concerns.

## 2018-04-01 NOTE — ED Triage Notes (Signed)
Pt c/o abd pain since last night.  Denies cough, fever, v/d.  LBM was yesterday and was normal per pt.

## 2018-05-10 DIAGNOSIS — L84 Corns and callosities: Secondary | ICD-10-CM | POA: Diagnosis not present

## 2018-05-10 DIAGNOSIS — E1151 Type 2 diabetes mellitus with diabetic peripheral angiopathy without gangrene: Secondary | ICD-10-CM | POA: Diagnosis not present

## 2018-05-18 DIAGNOSIS — E1165 Type 2 diabetes mellitus with hyperglycemia: Secondary | ICD-10-CM | POA: Diagnosis not present

## 2018-05-18 DIAGNOSIS — E782 Mixed hyperlipidemia: Secondary | ICD-10-CM | POA: Diagnosis not present

## 2018-05-18 DIAGNOSIS — I119 Hypertensive heart disease without heart failure: Secondary | ICD-10-CM | POA: Diagnosis not present

## 2018-05-18 DIAGNOSIS — F331 Major depressive disorder, recurrent, moderate: Secondary | ICD-10-CM | POA: Diagnosis not present

## 2018-05-18 DIAGNOSIS — M545 Low back pain: Secondary | ICD-10-CM | POA: Diagnosis not present

## 2018-05-18 DIAGNOSIS — M810 Age-related osteoporosis without current pathological fracture: Secondary | ICD-10-CM | POA: Diagnosis not present

## 2018-05-18 DIAGNOSIS — N401 Enlarged prostate with lower urinary tract symptoms: Secondary | ICD-10-CM | POA: Diagnosis not present

## 2018-05-18 DIAGNOSIS — R0602 Shortness of breath: Secondary | ICD-10-CM | POA: Diagnosis not present

## 2018-05-26 DIAGNOSIS — E782 Mixed hyperlipidemia: Secondary | ICD-10-CM | POA: Diagnosis not present

## 2018-05-26 DIAGNOSIS — R350 Frequency of micturition: Secondary | ICD-10-CM | POA: Diagnosis not present

## 2018-05-26 DIAGNOSIS — D649 Anemia, unspecified: Secondary | ICD-10-CM | POA: Diagnosis not present

## 2018-05-26 DIAGNOSIS — E119 Type 2 diabetes mellitus without complications: Secondary | ICD-10-CM | POA: Diagnosis not present

## 2018-05-26 DIAGNOSIS — E559 Vitamin D deficiency, unspecified: Secondary | ICD-10-CM | POA: Diagnosis not present

## 2018-05-26 DIAGNOSIS — D519 Vitamin B12 deficiency anemia, unspecified: Secondary | ICD-10-CM | POA: Diagnosis not present

## 2018-05-26 DIAGNOSIS — E039 Hypothyroidism, unspecified: Secondary | ICD-10-CM | POA: Diagnosis not present

## 2018-05-26 DIAGNOSIS — E785 Hyperlipidemia, unspecified: Secondary | ICD-10-CM | POA: Diagnosis not present

## 2018-06-01 DIAGNOSIS — M6281 Muscle weakness (generalized): Secondary | ICD-10-CM | POA: Diagnosis not present

## 2018-06-01 DIAGNOSIS — R2689 Other abnormalities of gait and mobility: Secondary | ICD-10-CM | POA: Diagnosis not present

## 2018-06-02 DIAGNOSIS — R2689 Other abnormalities of gait and mobility: Secondary | ICD-10-CM | POA: Diagnosis not present

## 2018-06-02 DIAGNOSIS — M6281 Muscle weakness (generalized): Secondary | ICD-10-CM | POA: Diagnosis not present

## 2018-06-03 DIAGNOSIS — M6281 Muscle weakness (generalized): Secondary | ICD-10-CM | POA: Diagnosis not present

## 2018-06-03 DIAGNOSIS — R2689 Other abnormalities of gait and mobility: Secondary | ICD-10-CM | POA: Diagnosis not present

## 2018-06-06 DIAGNOSIS — R278 Other lack of coordination: Secondary | ICD-10-CM | POA: Diagnosis not present

## 2018-06-06 DIAGNOSIS — M6281 Muscle weakness (generalized): Secondary | ICD-10-CM | POA: Diagnosis not present

## 2018-06-06 DIAGNOSIS — R293 Abnormal posture: Secondary | ICD-10-CM | POA: Diagnosis not present

## 2018-06-08 DIAGNOSIS — R2689 Other abnormalities of gait and mobility: Secondary | ICD-10-CM | POA: Diagnosis not present

## 2018-06-08 DIAGNOSIS — M6281 Muscle weakness (generalized): Secondary | ICD-10-CM | POA: Diagnosis not present

## 2018-06-09 DIAGNOSIS — M6281 Muscle weakness (generalized): Secondary | ICD-10-CM | POA: Diagnosis not present

## 2018-06-09 DIAGNOSIS — R2689 Other abnormalities of gait and mobility: Secondary | ICD-10-CM | POA: Diagnosis not present

## 2018-06-10 DIAGNOSIS — M6281 Muscle weakness (generalized): Secondary | ICD-10-CM | POA: Diagnosis not present

## 2018-06-10 DIAGNOSIS — R293 Abnormal posture: Secondary | ICD-10-CM | POA: Diagnosis not present

## 2018-06-10 DIAGNOSIS — R278 Other lack of coordination: Secondary | ICD-10-CM | POA: Diagnosis not present

## 2018-06-14 DIAGNOSIS — M6281 Muscle weakness (generalized): Secondary | ICD-10-CM | POA: Diagnosis not present

## 2018-06-14 DIAGNOSIS — R2689 Other abnormalities of gait and mobility: Secondary | ICD-10-CM | POA: Diagnosis not present

## 2018-06-14 DIAGNOSIS — R278 Other lack of coordination: Secondary | ICD-10-CM | POA: Diagnosis not present

## 2018-06-14 DIAGNOSIS — R293 Abnormal posture: Secondary | ICD-10-CM | POA: Diagnosis not present

## 2018-06-15 DIAGNOSIS — R2689 Other abnormalities of gait and mobility: Secondary | ICD-10-CM | POA: Diagnosis not present

## 2018-06-15 DIAGNOSIS — M6281 Muscle weakness (generalized): Secondary | ICD-10-CM | POA: Diagnosis not present

## 2018-06-16 DIAGNOSIS — M6281 Muscle weakness (generalized): Secondary | ICD-10-CM | POA: Diagnosis not present

## 2018-06-16 DIAGNOSIS — R293 Abnormal posture: Secondary | ICD-10-CM | POA: Diagnosis not present

## 2018-06-16 DIAGNOSIS — R2689 Other abnormalities of gait and mobility: Secondary | ICD-10-CM | POA: Diagnosis not present

## 2018-06-16 DIAGNOSIS — R278 Other lack of coordination: Secondary | ICD-10-CM | POA: Diagnosis not present

## 2018-06-17 DIAGNOSIS — R278 Other lack of coordination: Secondary | ICD-10-CM | POA: Diagnosis not present

## 2018-06-17 DIAGNOSIS — M6281 Muscle weakness (generalized): Secondary | ICD-10-CM | POA: Diagnosis not present

## 2018-06-17 DIAGNOSIS — R293 Abnormal posture: Secondary | ICD-10-CM | POA: Diagnosis not present

## 2018-06-21 DIAGNOSIS — R2689 Other abnormalities of gait and mobility: Secondary | ICD-10-CM | POA: Diagnosis not present

## 2018-06-21 DIAGNOSIS — R293 Abnormal posture: Secondary | ICD-10-CM | POA: Diagnosis not present

## 2018-06-21 DIAGNOSIS — M6281 Muscle weakness (generalized): Secondary | ICD-10-CM | POA: Diagnosis not present

## 2018-06-21 DIAGNOSIS — R278 Other lack of coordination: Secondary | ICD-10-CM | POA: Diagnosis not present

## 2018-06-22 DIAGNOSIS — M6281 Muscle weakness (generalized): Secondary | ICD-10-CM | POA: Diagnosis not present

## 2018-06-22 DIAGNOSIS — R2689 Other abnormalities of gait and mobility: Secondary | ICD-10-CM | POA: Diagnosis not present

## 2018-06-23 DIAGNOSIS — R2689 Other abnormalities of gait and mobility: Secondary | ICD-10-CM | POA: Diagnosis not present

## 2018-06-23 DIAGNOSIS — M6281 Muscle weakness (generalized): Secondary | ICD-10-CM | POA: Diagnosis not present

## 2018-06-24 DIAGNOSIS — R278 Other lack of coordination: Secondary | ICD-10-CM | POA: Diagnosis not present

## 2018-06-24 DIAGNOSIS — M6281 Muscle weakness (generalized): Secondary | ICD-10-CM | POA: Diagnosis not present

## 2018-06-24 DIAGNOSIS — R293 Abnormal posture: Secondary | ICD-10-CM | POA: Diagnosis not present

## 2018-06-25 DIAGNOSIS — R278 Other lack of coordination: Secondary | ICD-10-CM | POA: Diagnosis not present

## 2018-06-25 DIAGNOSIS — R293 Abnormal posture: Secondary | ICD-10-CM | POA: Diagnosis not present

## 2018-06-25 DIAGNOSIS — M6281 Muscle weakness (generalized): Secondary | ICD-10-CM | POA: Diagnosis not present

## 2018-06-28 DIAGNOSIS — R278 Other lack of coordination: Secondary | ICD-10-CM | POA: Diagnosis not present

## 2018-06-28 DIAGNOSIS — R293 Abnormal posture: Secondary | ICD-10-CM | POA: Diagnosis not present

## 2018-06-28 DIAGNOSIS — M6281 Muscle weakness (generalized): Secondary | ICD-10-CM | POA: Diagnosis not present

## 2018-06-28 DIAGNOSIS — R2689 Other abnormalities of gait and mobility: Secondary | ICD-10-CM | POA: Diagnosis not present

## 2018-06-29 DIAGNOSIS — M6281 Muscle weakness (generalized): Secondary | ICD-10-CM | POA: Diagnosis not present

## 2018-06-29 DIAGNOSIS — R2689 Other abnormalities of gait and mobility: Secondary | ICD-10-CM | POA: Diagnosis not present

## 2018-06-30 DIAGNOSIS — R2689 Other abnormalities of gait and mobility: Secondary | ICD-10-CM | POA: Diagnosis not present

## 2018-06-30 DIAGNOSIS — M6281 Muscle weakness (generalized): Secondary | ICD-10-CM | POA: Diagnosis not present

## 2018-06-30 DIAGNOSIS — R278 Other lack of coordination: Secondary | ICD-10-CM | POA: Diagnosis not present

## 2018-06-30 DIAGNOSIS — R293 Abnormal posture: Secondary | ICD-10-CM | POA: Diagnosis not present

## 2018-07-01 DIAGNOSIS — R293 Abnormal posture: Secondary | ICD-10-CM | POA: Diagnosis not present

## 2018-07-01 DIAGNOSIS — M6281 Muscle weakness (generalized): Secondary | ICD-10-CM | POA: Diagnosis not present

## 2018-07-01 DIAGNOSIS — R278 Other lack of coordination: Secondary | ICD-10-CM | POA: Diagnosis not present

## 2018-07-05 DIAGNOSIS — R278 Other lack of coordination: Secondary | ICD-10-CM | POA: Diagnosis not present

## 2018-07-05 DIAGNOSIS — M6281 Muscle weakness (generalized): Secondary | ICD-10-CM | POA: Diagnosis not present

## 2018-07-05 DIAGNOSIS — R2689 Other abnormalities of gait and mobility: Secondary | ICD-10-CM | POA: Diagnosis not present

## 2018-07-05 DIAGNOSIS — R293 Abnormal posture: Secondary | ICD-10-CM | POA: Diagnosis not present

## 2018-07-06 DIAGNOSIS — M6281 Muscle weakness (generalized): Secondary | ICD-10-CM | POA: Diagnosis not present

## 2018-07-06 DIAGNOSIS — R2689 Other abnormalities of gait and mobility: Secondary | ICD-10-CM | POA: Diagnosis not present

## 2018-07-07 ENCOUNTER — Other Ambulatory Visit: Payer: Self-pay

## 2018-07-07 ENCOUNTER — Encounter (HOSPITAL_COMMUNITY): Payer: Self-pay | Admitting: Emergency Medicine

## 2018-07-07 ENCOUNTER — Emergency Department (HOSPITAL_COMMUNITY): Payer: PPO

## 2018-07-07 ENCOUNTER — Emergency Department (HOSPITAL_COMMUNITY)
Admission: EM | Admit: 2018-07-07 | Discharge: 2018-07-07 | Disposition: A | Discharge status: Home / Self Care | Payer: PPO | Attending: Emergency Medicine | Admitting: Emergency Medicine

## 2018-07-07 DIAGNOSIS — Y999 Unspecified external cause status: Secondary | ICD-10-CM | POA: Diagnosis not present

## 2018-07-07 DIAGNOSIS — Y92129 Unspecified place in nursing home as the place of occurrence of the external cause: Secondary | ICD-10-CM | POA: Diagnosis not present

## 2018-07-07 DIAGNOSIS — S0990XA Unspecified injury of head, initial encounter: Secondary | ICD-10-CM | POA: Diagnosis not present

## 2018-07-07 DIAGNOSIS — Z7984 Long term (current) use of oral hypoglycemic drugs: Secondary | ICD-10-CM | POA: Diagnosis not present

## 2018-07-07 DIAGNOSIS — E119 Type 2 diabetes mellitus without complications: Secondary | ICD-10-CM | POA: Diagnosis not present

## 2018-07-07 DIAGNOSIS — J449 Chronic obstructive pulmonary disease, unspecified: Secondary | ICD-10-CM | POA: Insufficient documentation

## 2018-07-07 DIAGNOSIS — Y939 Activity, unspecified: Secondary | ICD-10-CM | POA: Insufficient documentation

## 2018-07-07 DIAGNOSIS — Z87891 Personal history of nicotine dependence: Secondary | ICD-10-CM | POA: Diagnosis not present

## 2018-07-07 DIAGNOSIS — W01190A Fall on same level from slipping, tripping and stumbling with subsequent striking against furniture, initial encounter: Secondary | ICD-10-CM | POA: Insufficient documentation

## 2018-07-07 DIAGNOSIS — W19XXXA Unspecified fall, initial encounter: Secondary | ICD-10-CM | POA: Diagnosis not present

## 2018-07-07 DIAGNOSIS — R404 Transient alteration of awareness: Secondary | ICD-10-CM | POA: Diagnosis not present

## 2018-07-07 DIAGNOSIS — Z79899 Other long term (current) drug therapy: Secondary | ICD-10-CM | POA: Insufficient documentation

## 2018-07-07 DIAGNOSIS — I10 Essential (primary) hypertension: Secondary | ICD-10-CM | POA: Insufficient documentation

## 2018-07-07 DIAGNOSIS — S0083XA Contusion of other part of head, initial encounter: Secondary | ICD-10-CM | POA: Insufficient documentation

## 2018-07-07 DIAGNOSIS — R51 Headache: Secondary | ICD-10-CM | POA: Diagnosis not present

## 2018-07-07 LAB — CBG MONITORING, ED: Glucose-Capillary: 89 mg/dL (ref 70–99)

## 2018-07-07 NOTE — ED Notes (Signed)
Call to [facility],

## 2018-07-07 NOTE — ED Notes (Signed)
BGL 89 Nurse notified.

## 2018-07-07 NOTE — Discharge Instructions (Addendum)
Your vital signs are within normal limits.  Your oxygen level is 97% on room air, which is within normal limits.  A CT scan of your head is negative for skull fracture, and there is no evidence of intracranial acute abnormality.  A CT scan of your cervical spine is negative for fracture or dislocation.  You have multiple areas of arthritis and degenerative disc disease, but no acute changes today.  Please use Tylenol extra strength for headache or soreness.  Please see your primary physician or return to the emergency department if any changes in your condition, problems, or concerns.

## 2018-07-07 NOTE — ED Triage Notes (Signed)
PT brought in by [facility] for a witness trip and fall into a chair this am and hit the front his forehead with small laceration noted but bleeding controlled at this time. Neuro WNL and pt is not on a blood thinner medication per his MAR from facility.

## 2018-07-07 NOTE — ED Provider Notes (Signed)
[facility] EMERGENCY DEPARTMENT Provider Note   CSN: [id number] Arrival date & time: 07/07/18  1013     History   Chief Complaint Chief Complaint  Patient presents with   Head Injury    HPI Christian Morris is a 78 y.o. male.     Patient is a 78 year old male who presents to the emergency department by EMS following a fall.  The EMS personnel and personnel at the nursing facility report that the patient tripped and fell, and this was a witnessed event.  The patient had a chair, and sustained a bruise and small laceration of the forehead.  The patient is not on anticoagulation medications.  There was no loss of consciousness.  The patient is awake and alert at the arrival of EMS and at the arrival here at the emergency department.  The patient denies any other injuries, including no chest area pain, no abdominal discomfort, no pelvis area pain, and no extremity pain.  He presents now for evaluation following a fall.  It is of note that the patient is a diabetic.  The history is provided by the patient and the EMS personnel.  Head Injury Associated symptoms: no nausea, no neck pain, no numbness, no seizures, no tinnitus and no vomiting     Past Medical History:  Diagnosis Date   Anxiety    Arthritis    "legs" (02/21/2014)   Chronic lower back pain    Daily headache    "here lately" (02/21/2014)   Depression    Fatty liver disease, nonalcoholic    GERD (gastroesophageal reflux disease)    History of blood transfusion ~ 1958   "related to bleeding ulcers"   History of gastric ulcer ~ 1958   no recurrence since.   Hyperlipidemia    Hypertension    Hypogonadism male 01/11/2011   Kidney stones    Lung nodule    right 16 mm, seen initially 6/12. 11m in 08/2011.   OSA on CPAP    Squamous cell cancer of skin of forearm 01/23/2011   left   Type II diabetes mellitus ([place]    Urinary hesitancy    Vitamin D deficiency 11/19/2014    Patient Active Problem List     Diagnosis Date Noted   Memory loss 02/11/2015   Vitamin D deficiency 11/19/2014   Loss of weight 11/11/2014   Hematuria 03/12/2014   Frequent falls 11/14/2013   Anemia 12/16/2012   BPH (benign prostatic hyperplasia) 10/28/2012   Nephrolithiasis 10/28/2012   Environmental allergies 02/01/2012   Squamous cell carcinoma in situ of skin of forearm 01/23/2011   Hypogonadism male 01/11/2011   Plantar fasciitis 05/19/2010   ERECTILE DYSFUNCTION, ORGANIC 12/31/2009   COPD (chronic obstructive pulmonary disease) ([place] 08/16/2009   GERD 08/16/2009   Hepatic steatosis 08/16/2009   LOW BACK PAIN, CHRONIC 08/16/2009   MICROALBUMINURIA 08/16/2009   Diabetes type 2, controlled ([place] 08/06/2009   Hyperlipidemia 08/06/2009   Obesity (BMI 30.0-34.9) 08/06/2009   ANXIETY DEPRESSION 08/06/2009   OSA (obstructive sleep apnea) 08/06/2009   Essential hypertension 08/06/2009   Osteoarthrosis involving lower leg 08/06/2009   PUD, HX OF 08/06/2009   COLONIC POLYPS, HX OF 08/06/2009    Past Surgical History:  Procedure Laterality Date   CARDIAC CATHETERIZATION     CATARACT EXTRACTION W/ INTRAOCULAR LENS IMPLANT Left 08/2009   Dr GKaty Fitch  LEFT HEART CATHETERIZATION WITH CORONARY ANGIOGRAM N/A 11/17/2012   Procedure: LEFT HEART CATHETERIZATION WITH CORONARY ANGIOGRAM;  Surgeon: CBurnell Blanks MD;  Location: [facility];  Service: Cardiovascular;  Laterality: N/A;   MELANOMA EXCISION Left 01/23/2011   forearm        Home Medications    Prior to Admission medications   Medication Sig Start Date End Date Taking? Authorizing Provider  acetaminophen (TYLENOL) 500 MG tablet Take 500 mg by mouth every 6 (six) hours as needed.    [provider]  atorvastatin (LIPITOR) 40 MG tablet Take 1 tablet (40 mg total) daily by mouth. 11/16/16   [name], NP  benzonatate (TESSALON) 200 MG capsule Take 1 capsule (200 mg total) by mouth 3 (three) times  daily as needed for cough. Swallow whole, do not chew 03/07/18   Triplett, Tammy, PA-C  Blood Glucose Monitoring Suppl (ONETOUCH VERIO) w/Device KIT 1 kit by Does not apply route daily. Use to check blood sugar once a day.  Dx  E11.9 09/23/16   [name], NP  Blood Pressure Monitoring [facility]) MISC Use to check blood pressure daily if needed.  DX I10 09/23/16   [name], NP  diclofenac sodium (VOLTAREN) 1 % GEL Apply 2 g topically 4 (four) times daily. 02/12/17   Jamse Arn, MD  donepezil (ARICEPT) 5 MG tablet  12/08/16   [provider]  fish oil-omega-3 fatty acids 1000 MG capsule Take 2 g by mouth 2 (two) times daily.      [provider]  glucose blood (ONETOUCH VERIO) test strip Use to check blood sugar two times a day.  DX  E11.9 09/25/16   [name], NP  hydrOXYzine (ATARAX/VISTARIL) 25 MG tablet Take 0.5 tablets (12.5 mg total) by mouth every 8 (eight) hours as needed for itching. 12/31/16   Mesner, Corene Cornea, MD  lisinopril (PRINIVIL,ZESTRIL) 5 MG tablet Take 1 tablet (5 mg total) by mouth daily. 08/14/16   [name], NP  metFORMIN (GLUCOPHAGE) 500 MG tablet TAKE TWO TABLETS BY MOUTH TWICE DAILY WITH FOOD 11/16/16   [name], NP  methocarbamol (ROBAXIN-750) 750 MG tablet Take 1 tablet (750 mg total) by mouth 2 (two) times daily as needed for muscle spasms. 02/12/17   Jamse Arn, MD  nitroGLYCERIN (NITROSTAT) 0.4 MG SL tablet Place 1 tablet (0.4 mg total) under the tongue every 5 (five) minutes x 3 doses as needed for chest pain (donot take with viagra). 11/04/16   [name], NP  ONE TOUCH LANCETS MISC Use to check blood sugar twice a day.  DX E11.9 09/25/16   [name], NP  pantoprazole (PROTONIX) 40 MG tablet Take 1 tablet (40 mg total) daily by mouth. 11/16/16   [name], NP  PARoxetine (PAXIL) 30 MG tablet 1 tablet by mouth once daily 11/16/16   [name], NP  permethrin  (ELIMITE) 5 % cream Apply to affected area once on January 1st if symptoms not improving 12/31/16   Mesner, Corene Cornea, MD  tamsulosin (FLOMAX) 0.4 MG CAPS capsule Take 1 capsule (0.4 mg total) daily by mouth. 11/16/16   [name], NP    Family History Family History  Problem Relation Age of Onset   Arthritis Mother    Heart disease Sister        Massive MI age 19.   Arrhythmia Brother    Melanoma Son    Heart attack Brother     Social History Social History   Tobacco Use   Smoking status: Former Smoker    Packs/day: 1.00    Years: 25.00    Pack years: 25.00  Types: Cigarettes    Quit date: 01/05/1982    Years since quitting: 36.5   Smokeless tobacco: Never Used  Substance Use Topics   Alcohol use: No    Frequency: Never   Drug use: No     Allergies   Patient has no known allergies.   Review of Systems Review of Systems  Constitutional: Negative for activity change and appetite change.  HENT: Negative for congestion, ear discharge, ear pain, facial swelling, nosebleeds, rhinorrhea, sneezing and tinnitus.   Eyes: Negative for photophobia, pain and discharge.  Respiratory: Negative for cough, choking, shortness of breath and wheezing.   Cardiovascular: Negative for chest pain, palpitations and leg swelling.  Gastrointestinal: Negative for abdominal pain, blood in stool, constipation, diarrhea, nausea and vomiting.  Genitourinary: Negative for difficulty urinating, dysuria, flank pain, frequency and hematuria.  Musculoskeletal: Positive for arthralgias and back pain. Negative for gait problem, myalgias and neck pain.  Skin: Negative for color change, rash and wound.  Neurological: Negative for dizziness, seizures, syncope, facial asymmetry, speech difficulty, weakness and numbness.  Hematological: Negative for adenopathy. Does not bruise/bleed easily.  Psychiatric/Behavioral: Negative for agitation, confusion, hallucinations, self-injury and suicidal  ideas. The patient is nervous/anxious.      Physical Exam Updated Vital Signs BP 128/72 (BP Location: Left Arm)    Pulse 90    Temp 97.6 F (36.4 C) (Oral)    Resp 18    Ht 6' (1.829 m)    Wt 95.3 kg    SpO2 97%    BMI 28.48 kg/m   Physical Exam Vitals signs and nursing note reviewed.  Constitutional:      Appearance: He is well-developed. He is not toxic-appearing.  HENT:     Head: Normocephalic. Contusion present. No Battle's sign.      Right Ear: Tympanic membrane and external ear normal.     Left Ear: Tympanic membrane and external ear normal.  Eyes:     General: Lids are normal.     Pupils: Pupils are equal, round, and reactive to light.  Neck:     Musculoskeletal: Normal range of motion and neck supple.     Vascular: No carotid bruit.  Cardiovascular:     Rate and Rhythm: Normal rate and regular rhythm.     Pulses: Normal pulses.     Heart sounds: Normal heart sounds.  Pulmonary:     Effort: No respiratory distress.     Breath sounds: Normal breath sounds.  Abdominal:     General: Bowel sounds are normal.     Palpations: Abdomen is soft.     Tenderness: There is no abdominal tenderness. There is no guarding.  Musculoskeletal: Normal range of motion.     Comments: Multiple areas of joint stiffness. Fair range of motion of the right and left shoulder, elbow, wrist, and fingers.  There are degenerative joint disease changes present.  No deformity of the upper extremities.  There is no pain with rocking of the pelvis.  There is no pain with attempted range of motion of the hip.  There is increased stiffness of the right and left knee, right and left ankle.  The dorsalis pedis pulses 2+.  The radial pulses also 2+.  Lymphadenopathy:     Head:     Right side of head: No submandibular adenopathy.     Left side of head: No submandibular adenopathy.     Cervical: No cervical adenopathy.  Skin:    General: Skin is warm and dry.  Neurological:  Mental Status: He is  alert and oriented to person, place, and time.     Cranial Nerves: No cranial nerve deficit.     Sensory: No sensory deficit.  Psychiatric:        Speech: Speech normal.      ED Treatments / Results  Labs (all labs ordered are listed, but only abnormal results are displayed) Labs Reviewed  CBG MONITORING, ED    EKG None  Radiology No results found.  Procedures Procedures (including critical care time)  Medications Ordered in ED Medications - No data to display   Initial Impression / Assessment and Plan / ED Course  I have reviewed the triage vital signs and the nursing notes.  Pertinent labs & imaging results that were available during my care of the patient were reviewed by me and considered in my medical decision making (see chart for details).          Final Clinical Impressions(s) / ED Diagnoses MDM  Vital signs within normal limits.  Pulse oximetry is 97% on room air.  Within normal limits by my interpretation.  The patient is awake and alert at this time.  No gross neurologic deficits appreciated on initial examination. Capillary blood glucose is within normal limits at 89.  A CT scan of the head was obtained and there is no evidence of acute infarction, hemorrhage, hydrocephalus, or other intracranial abnormalities. CT scan of the cervical spine is negative for fracture or dislocation.  There are degenerative changes noted throughout the mid and lower cervical spine with narrowed joint space and osteophyte formation, but no fracture and no dislocation.  Patient drinking fluids without problem here in the emergency department.  Patient ambulated in the hallway.  Feel that it is safe for the patient to be discharged back to the nursing facility.   Final diagnoses:  Contusion of forehead, initial encounter  Fall, initial encounter    ED Discharge Orders    None       Lily Kocher, PA-C 07/07/18 1434    Fredia Sorrow, MD 07/07/18 1606

## 2018-07-08 DIAGNOSIS — R293 Abnormal posture: Secondary | ICD-10-CM | POA: Diagnosis not present

## 2018-07-08 DIAGNOSIS — M6281 Muscle weakness (generalized): Secondary | ICD-10-CM | POA: Diagnosis not present

## 2018-07-08 DIAGNOSIS — R278 Other lack of coordination: Secondary | ICD-10-CM | POA: Diagnosis not present

## 2018-07-12 DIAGNOSIS — R293 Abnormal posture: Secondary | ICD-10-CM | POA: Diagnosis not present

## 2018-07-12 DIAGNOSIS — R278 Other lack of coordination: Secondary | ICD-10-CM | POA: Diagnosis not present

## 2018-07-12 DIAGNOSIS — M6281 Muscle weakness (generalized): Secondary | ICD-10-CM | POA: Diagnosis not present

## 2018-07-12 DIAGNOSIS — R2689 Other abnormalities of gait and mobility: Secondary | ICD-10-CM | POA: Diagnosis not present

## 2018-07-13 DIAGNOSIS — R0602 Shortness of breath: Secondary | ICD-10-CM | POA: Diagnosis not present

## 2018-07-13 DIAGNOSIS — R2689 Other abnormalities of gait and mobility: Secondary | ICD-10-CM | POA: Diagnosis not present

## 2018-07-13 DIAGNOSIS — F331 Major depressive disorder, recurrent, moderate: Secondary | ICD-10-CM | POA: Diagnosis not present

## 2018-07-13 DIAGNOSIS — M6281 Muscle weakness (generalized): Secondary | ICD-10-CM | POA: Diagnosis not present

## 2018-07-13 DIAGNOSIS — I119 Hypertensive heart disease without heart failure: Secondary | ICD-10-CM | POA: Diagnosis not present

## 2018-07-13 DIAGNOSIS — E1165 Type 2 diabetes mellitus with hyperglycemia: Secondary | ICD-10-CM | POA: Diagnosis not present

## 2018-07-13 DIAGNOSIS — W1830XD Fall on same level, unspecified, subsequent encounter: Secondary | ICD-10-CM | POA: Diagnosis not present

## 2018-07-13 DIAGNOSIS — E782 Mixed hyperlipidemia: Secondary | ICD-10-CM | POA: Diagnosis not present

## 2018-07-13 DIAGNOSIS — M545 Low back pain: Secondary | ICD-10-CM | POA: Diagnosis not present

## 2018-07-14 DIAGNOSIS — R293 Abnormal posture: Secondary | ICD-10-CM | POA: Diagnosis not present

## 2018-07-14 DIAGNOSIS — R2689 Other abnormalities of gait and mobility: Secondary | ICD-10-CM | POA: Diagnosis not present

## 2018-07-14 DIAGNOSIS — R278 Other lack of coordination: Secondary | ICD-10-CM | POA: Diagnosis not present

## 2018-07-14 DIAGNOSIS — M6281 Muscle weakness (generalized): Secondary | ICD-10-CM | POA: Diagnosis not present

## 2018-07-15 DIAGNOSIS — R278 Other lack of coordination: Secondary | ICD-10-CM | POA: Diagnosis not present

## 2018-07-15 DIAGNOSIS — M6281 Muscle weakness (generalized): Secondary | ICD-10-CM | POA: Diagnosis not present

## 2018-07-15 DIAGNOSIS — R293 Abnormal posture: Secondary | ICD-10-CM | POA: Diagnosis not present

## 2018-07-18 DIAGNOSIS — M6281 Muscle weakness (generalized): Secondary | ICD-10-CM | POA: Diagnosis not present

## 2018-07-18 DIAGNOSIS — R278 Other lack of coordination: Secondary | ICD-10-CM | POA: Diagnosis not present

## 2018-07-18 DIAGNOSIS — R293 Abnormal posture: Secondary | ICD-10-CM | POA: Diagnosis not present

## 2018-07-19 DIAGNOSIS — R278 Other lack of coordination: Secondary | ICD-10-CM | POA: Diagnosis not present

## 2018-07-19 DIAGNOSIS — R2689 Other abnormalities of gait and mobility: Secondary | ICD-10-CM | POA: Diagnosis not present

## 2018-07-19 DIAGNOSIS — M6281 Muscle weakness (generalized): Secondary | ICD-10-CM | POA: Diagnosis not present

## 2018-07-19 DIAGNOSIS — R293 Abnormal posture: Secondary | ICD-10-CM | POA: Diagnosis not present

## 2018-07-20 DIAGNOSIS — M6281 Muscle weakness (generalized): Secondary | ICD-10-CM | POA: Diagnosis not present

## 2018-07-20 DIAGNOSIS — R2689 Other abnormalities of gait and mobility: Secondary | ICD-10-CM | POA: Diagnosis not present

## 2018-07-21 DIAGNOSIS — M6281 Muscle weakness (generalized): Secondary | ICD-10-CM | POA: Diagnosis not present

## 2018-07-21 DIAGNOSIS — R2689 Other abnormalities of gait and mobility: Secondary | ICD-10-CM | POA: Diagnosis not present

## 2018-07-22 DIAGNOSIS — I959 Hypotension, unspecified: Secondary | ICD-10-CM | POA: Diagnosis not present

## 2018-07-22 DIAGNOSIS — R404 Transient alteration of awareness: Secondary | ICD-10-CM | POA: Diagnosis not present

## 2018-07-22 DIAGNOSIS — W19XXXA Unspecified fall, initial encounter: Secondary | ICD-10-CM | POA: Diagnosis not present

## 2018-07-25 DIAGNOSIS — R293 Abnormal posture: Secondary | ICD-10-CM | POA: Diagnosis not present

## 2018-07-25 DIAGNOSIS — R278 Other lack of coordination: Secondary | ICD-10-CM | POA: Diagnosis not present

## 2018-07-25 DIAGNOSIS — M6281 Muscle weakness (generalized): Secondary | ICD-10-CM | POA: Diagnosis not present

## 2018-07-26 DIAGNOSIS — R293 Abnormal posture: Secondary | ICD-10-CM | POA: Diagnosis not present

## 2018-07-26 DIAGNOSIS — R278 Other lack of coordination: Secondary | ICD-10-CM | POA: Diagnosis not present

## 2018-07-26 DIAGNOSIS — M6281 Muscle weakness (generalized): Secondary | ICD-10-CM | POA: Diagnosis not present

## 2018-07-26 DIAGNOSIS — R2689 Other abnormalities of gait and mobility: Secondary | ICD-10-CM | POA: Diagnosis not present

## 2018-07-27 DIAGNOSIS — F331 Major depressive disorder, recurrent, moderate: Secondary | ICD-10-CM | POA: Diagnosis not present

## 2018-07-27 DIAGNOSIS — M810 Age-related osteoporosis without current pathological fracture: Secondary | ICD-10-CM | POA: Diagnosis not present

## 2018-07-27 DIAGNOSIS — I119 Hypertensive heart disease without heart failure: Secondary | ICD-10-CM | POA: Diagnosis not present

## 2018-07-27 DIAGNOSIS — N401 Enlarged prostate with lower urinary tract symptoms: Secondary | ICD-10-CM | POA: Diagnosis not present

## 2018-07-27 DIAGNOSIS — L84 Corns and callosities: Secondary | ICD-10-CM | POA: Diagnosis not present

## 2018-07-27 DIAGNOSIS — R2681 Unsteadiness on feet: Secondary | ICD-10-CM | POA: Diagnosis not present

## 2018-07-27 DIAGNOSIS — E782 Mixed hyperlipidemia: Secondary | ICD-10-CM | POA: Diagnosis not present

## 2018-07-27 DIAGNOSIS — G301 Alzheimer's disease with late onset: Secondary | ICD-10-CM | POA: Diagnosis not present

## 2018-07-27 DIAGNOSIS — M545 Low back pain: Secondary | ICD-10-CM | POA: Diagnosis not present

## 2018-07-27 DIAGNOSIS — R2689 Other abnormalities of gait and mobility: Secondary | ICD-10-CM | POA: Diagnosis not present

## 2018-07-27 DIAGNOSIS — R296 Repeated falls: Secondary | ICD-10-CM | POA: Diagnosis not present

## 2018-07-27 DIAGNOSIS — E1165 Type 2 diabetes mellitus with hyperglycemia: Secondary | ICD-10-CM | POA: Diagnosis not present

## 2018-07-27 DIAGNOSIS — M6281 Muscle weakness (generalized): Secondary | ICD-10-CM | POA: Diagnosis not present

## 2018-07-27 DIAGNOSIS — E1151 Type 2 diabetes mellitus with diabetic peripheral angiopathy without gangrene: Secondary | ICD-10-CM | POA: Diagnosis not present

## 2018-07-28 DIAGNOSIS — R293 Abnormal posture: Secondary | ICD-10-CM | POA: Diagnosis not present

## 2018-07-28 DIAGNOSIS — R2689 Other abnormalities of gait and mobility: Secondary | ICD-10-CM | POA: Diagnosis not present

## 2018-07-28 DIAGNOSIS — R278 Other lack of coordination: Secondary | ICD-10-CM | POA: Diagnosis not present

## 2018-07-28 DIAGNOSIS — M6281 Muscle weakness (generalized): Secondary | ICD-10-CM | POA: Diagnosis not present

## 2018-08-01 DIAGNOSIS — R278 Other lack of coordination: Secondary | ICD-10-CM | POA: Diagnosis not present

## 2018-08-01 DIAGNOSIS — M6281 Muscle weakness (generalized): Secondary | ICD-10-CM | POA: Diagnosis not present

## 2018-08-01 DIAGNOSIS — R293 Abnormal posture: Secondary | ICD-10-CM | POA: Diagnosis not present

## 2018-08-02 DIAGNOSIS — M6281 Muscle weakness (generalized): Secondary | ICD-10-CM | POA: Diagnosis not present

## 2018-08-02 DIAGNOSIS — R2689 Other abnormalities of gait and mobility: Secondary | ICD-10-CM | POA: Diagnosis not present

## 2018-08-03 DIAGNOSIS — M810 Age-related osteoporosis without current pathological fracture: Secondary | ICD-10-CM | POA: Diagnosis not present

## 2018-08-03 DIAGNOSIS — M6281 Muscle weakness (generalized): Secondary | ICD-10-CM | POA: Diagnosis not present

## 2018-08-03 DIAGNOSIS — E782 Mixed hyperlipidemia: Secondary | ICD-10-CM | POA: Diagnosis not present

## 2018-08-03 DIAGNOSIS — F331 Major depressive disorder, recurrent, moderate: Secondary | ICD-10-CM | POA: Diagnosis not present

## 2018-08-03 DIAGNOSIS — N401 Enlarged prostate with lower urinary tract symptoms: Secondary | ICD-10-CM | POA: Diagnosis not present

## 2018-08-03 DIAGNOSIS — R2689 Other abnormalities of gait and mobility: Secondary | ICD-10-CM | POA: Diagnosis not present

## 2018-08-03 DIAGNOSIS — G301 Alzheimer's disease with late onset: Secondary | ICD-10-CM | POA: Diagnosis not present

## 2018-08-03 DIAGNOSIS — R296 Repeated falls: Secondary | ICD-10-CM | POA: Diagnosis not present

## 2018-08-03 DIAGNOSIS — M545 Low back pain: Secondary | ICD-10-CM | POA: Diagnosis not present

## 2018-08-03 DIAGNOSIS — R2681 Unsteadiness on feet: Secondary | ICD-10-CM | POA: Diagnosis not present

## 2018-08-03 DIAGNOSIS — R0602 Shortness of breath: Secondary | ICD-10-CM | POA: Diagnosis not present

## 2018-08-03 DIAGNOSIS — E1165 Type 2 diabetes mellitus with hyperglycemia: Secondary | ICD-10-CM | POA: Diagnosis not present

## 2018-08-03 DIAGNOSIS — I119 Hypertensive heart disease without heart failure: Secondary | ICD-10-CM | POA: Diagnosis not present

## 2018-08-04 DIAGNOSIS — M6281 Muscle weakness (generalized): Secondary | ICD-10-CM | POA: Diagnosis not present

## 2018-08-04 DIAGNOSIS — R293 Abnormal posture: Secondary | ICD-10-CM | POA: Diagnosis not present

## 2018-08-04 DIAGNOSIS — R278 Other lack of coordination: Secondary | ICD-10-CM | POA: Diagnosis not present

## 2018-08-04 DIAGNOSIS — R2689 Other abnormalities of gait and mobility: Secondary | ICD-10-CM | POA: Diagnosis not present

## 2024-02-08 ENCOUNTER — Telehealth: Payer: Self-pay | Admitting: Urology

## 2024-02-08 NOTE — Telephone Encounter (Signed)
 [facility] (Amy) called to reschedule and I offered her Feb 9 at 10:40 and she could not do that either. I informed her it would be April 22, she was ok with that appointment.

## 2024-02-09 ENCOUNTER — Ambulatory Visit: Admitting: Urology

## 2024-05-03 ENCOUNTER — Ambulatory Visit: Admitting: Urology
